# Patient Record
Sex: Female | Born: 1975 | Race: White | Hispanic: No | Marital: Married | Smoking: Former smoker
Health system: Southern US, Community
[De-identification: ages and names within clinical notes are randomized; demographics above are authoritative.]

## PROBLEM LIST (undated history)

## (undated) DIAGNOSIS — R519 Headache, unspecified: Secondary | ICD-10-CM

## (undated) DIAGNOSIS — F32A Depression, unspecified: Secondary | ICD-10-CM

## (undated) DIAGNOSIS — J189 Pneumonia, unspecified organism: Secondary | ICD-10-CM

## (undated) DIAGNOSIS — S065XAA Traumatic subdural hemorrhage with loss of consciousness status unknown, initial encounter: Secondary | ICD-10-CM

## (undated) DIAGNOSIS — K589 Irritable bowel syndrome without diarrhea: Secondary | ICD-10-CM

## (undated) DIAGNOSIS — I10 Essential (primary) hypertension: Secondary | ICD-10-CM

## (undated) DIAGNOSIS — G4730 Sleep apnea, unspecified: Secondary | ICD-10-CM

## (undated) DIAGNOSIS — F419 Anxiety disorder, unspecified: Secondary | ICD-10-CM

## (undated) DIAGNOSIS — K219 Gastro-esophageal reflux disease without esophagitis: Secondary | ICD-10-CM

## (undated) DIAGNOSIS — M1990 Unspecified osteoarthritis, unspecified site: Secondary | ICD-10-CM

## (undated) DIAGNOSIS — Z87442 Personal history of urinary calculi: Secondary | ICD-10-CM

## (undated) DIAGNOSIS — R0600 Dyspnea, unspecified: Secondary | ICD-10-CM

## (undated) DIAGNOSIS — J449 Chronic obstructive pulmonary disease, unspecified: Secondary | ICD-10-CM

## (undated) DIAGNOSIS — M25562 Pain in left knee: Secondary | ICD-10-CM

## (undated) DIAGNOSIS — S069XAA Unspecified intracranial injury with loss of consciousness status unknown, initial encounter: Secondary | ICD-10-CM

## (undated) DIAGNOSIS — R569 Unspecified convulsions: Secondary | ICD-10-CM

## (undated) DIAGNOSIS — F329 Major depressive disorder, single episode, unspecified: Secondary | ICD-10-CM

## (undated) DIAGNOSIS — N179 Acute kidney failure, unspecified: Secondary | ICD-10-CM

## (undated) HISTORY — DX: Sleep apnea, unspecified: G47.30

## (undated) HISTORY — DX: Gastro-esophageal reflux disease without esophagitis: K21.9

## (undated) HISTORY — PX: DILATION AND CURETTAGE OF UTERUS: SHX78

---

## 2007-11-21 ENCOUNTER — Emergency Department (HOSPITAL_COMMUNITY): Admission: EM | Admit: 2007-11-21 | Discharge: 2007-11-22 | Payer: Self-pay | Admitting: Emergency Medicine

## 2008-09-06 ENCOUNTER — Emergency Department (HOSPITAL_COMMUNITY): Admission: EM | Admit: 2008-09-06 | Discharge: 2008-09-06 | Payer: Self-pay | Admitting: Emergency Medicine

## 2008-09-23 ENCOUNTER — Emergency Department (HOSPITAL_COMMUNITY): Admission: EM | Admit: 2008-09-23 | Discharge: 2008-09-23 | Payer: Self-pay | Admitting: Emergency Medicine

## 2008-09-24 ENCOUNTER — Ambulatory Visit: Payer: Self-pay | Admitting: Obstetrics and Gynecology

## 2008-09-24 ENCOUNTER — Inpatient Hospital Stay (HOSPITAL_COMMUNITY): Admission: AD | Admit: 2008-09-24 | Discharge: 2008-09-24 | Payer: Self-pay | Admitting: Obstetrics & Gynecology

## 2008-09-24 ENCOUNTER — Encounter: Payer: Self-pay | Admitting: Obstetrics & Gynecology

## 2008-10-11 ENCOUNTER — Other Ambulatory Visit: Admission: RE | Admit: 2008-10-11 | Discharge: 2008-10-11 | Payer: Self-pay | Admitting: Obstetrics and Gynecology

## 2008-10-19 ENCOUNTER — Emergency Department (HOSPITAL_COMMUNITY): Admission: EM | Admit: 2008-10-19 | Discharge: 2008-10-19 | Payer: Self-pay | Admitting: Emergency Medicine

## 2009-03-04 ENCOUNTER — Emergency Department (HOSPITAL_COMMUNITY): Admission: EM | Admit: 2009-03-04 | Discharge: 2009-03-04 | Payer: Self-pay | Admitting: Emergency Medicine

## 2009-04-07 ENCOUNTER — Emergency Department (HOSPITAL_COMMUNITY): Admission: EM | Admit: 2009-04-07 | Discharge: 2009-04-07 | Payer: Self-pay | Admitting: Emergency Medicine

## 2010-01-17 ENCOUNTER — Ambulatory Visit: Payer: Self-pay | Admitting: Unknown Physician Specialty

## 2010-01-18 ENCOUNTER — Ambulatory Visit: Payer: Self-pay | Admitting: Unknown Physician Specialty

## 2010-01-23 LAB — PATHOLOGY REPORT

## 2010-02-13 ENCOUNTER — Ambulatory Visit: Payer: Self-pay

## 2010-10-24 LAB — URINE MICROSCOPIC-ADD ON

## 2010-10-24 LAB — URINALYSIS, ROUTINE W REFLEX MICROSCOPIC
Leukocytes, UA: NEGATIVE
Protein, ur: NEGATIVE mg/dL
Urobilinogen, UA: 0.2 mg/dL (ref 0.0–1.0)

## 2010-10-25 LAB — CBC
HCT: 37.1 % (ref 36.0–46.0)
Hemoglobin: 12.7 g/dL (ref 12.0–15.0)
Hemoglobin: 12.8 g/dL (ref 12.0–15.0)
MCHC: 34.3 g/dL (ref 30.0–36.0)
MCHC: 34.6 g/dL (ref 30.0–36.0)
MCV: 93.7 fL (ref 78.0–100.0)
MCV: 95.3 fL (ref 78.0–100.0)
RBC: 3.96 MIL/uL (ref 3.87–5.11)
RDW: 15.6 % — ABNORMAL HIGH (ref 11.5–15.5)

## 2010-10-25 LAB — RH IMMUNE GLOBULIN WORKUP (NOT WOMEN'S HOSP)

## 2010-10-25 LAB — DIFFERENTIAL
Basophils Absolute: 0 10*3/uL (ref 0.0–0.1)
Eosinophils Absolute: 0.1 10*3/uL (ref 0.0–0.7)
Lymphs Abs: 1.2 10*3/uL (ref 0.7–4.0)
Neutrophils Relative %: 69 % (ref 43–77)

## 2010-10-25 LAB — COMPREHENSIVE METABOLIC PANEL
ALT: 11 U/L (ref 0–35)
CO2: 24 mEq/L (ref 19–32)
Calcium: 8.9 mg/dL (ref 8.4–10.5)
Creatinine, Ser: 0.36 mg/dL — ABNORMAL LOW (ref 0.4–1.2)
GFR calc non Af Amer: >60 mL/min (ref >60)
Glucose, Bld: 89 mg/dL (ref 70–99)
Sodium: 138 mEq/L (ref 135–145)

## 2010-10-25 LAB — GC/CHLAMYDIA PROBE AMP, GENITAL
Chlamydia, DNA Probe: NEGATIVE
GC Probe Amp, Genital: NEGATIVE

## 2010-10-25 LAB — URINALYSIS, ROUTINE W REFLEX MICROSCOPIC
Bilirubin Urine: NEGATIVE
Ketones, ur: NEGATIVE mg/dL
Protein, ur: NEGATIVE mg/dL
Urobilinogen, UA: 0.2 mg/dL (ref 0.0–1.0)

## 2010-10-25 LAB — RAPID URINE DRUG SCREEN, HOSP PERFORMED
Amphetamines: NOT DETECTED
Barbiturates: NOT DETECTED
Opiates: POSITIVE — AB

## 2010-10-30 LAB — PREGNANCY, URINE: Preg Test, Ur: POSITIVE

## 2011-04-13 ENCOUNTER — Emergency Department (HOSPITAL_COMMUNITY): Payer: Medicaid Other

## 2011-04-13 ENCOUNTER — Encounter: Payer: Self-pay | Admitting: *Deleted

## 2011-04-13 ENCOUNTER — Emergency Department (HOSPITAL_COMMUNITY)
Admission: EM | Admit: 2011-04-13 | Discharge: 2011-04-13 | Disposition: A | Discharge status: Home / Self Care | Payer: Medicaid Other | Attending: Emergency Medicine | Admitting: Emergency Medicine

## 2011-04-13 DIAGNOSIS — F17200 Nicotine dependence, unspecified, uncomplicated: Secondary | ICD-10-CM | POA: Insufficient documentation

## 2011-04-13 DIAGNOSIS — F329 Major depressive disorder, single episode, unspecified: Secondary | ICD-10-CM | POA: Insufficient documentation

## 2011-04-13 DIAGNOSIS — F3289 Other specified depressive episodes: Secondary | ICD-10-CM | POA: Insufficient documentation

## 2011-04-13 DIAGNOSIS — M25579 Pain in unspecified ankle and joints of unspecified foot: Secondary | ICD-10-CM | POA: Insufficient documentation

## 2011-04-13 DIAGNOSIS — W19XXXA Unspecified fall, initial encounter: Secondary | ICD-10-CM | POA: Insufficient documentation

## 2011-04-13 DIAGNOSIS — S93409A Sprain of unspecified ligament of unspecified ankle, initial encounter: Secondary | ICD-10-CM | POA: Insufficient documentation

## 2011-04-13 LAB — CBC
MCH: 30.9 pg (ref 26.0–34.0)
MCHC: 33.2 g/dL (ref 30.0–36.0)
MCV: 93 fL (ref 78.0–100.0)
Platelets: 267 10*3/uL (ref 150–400)
RBC: 4.73 MIL/uL (ref 3.87–5.11)

## 2011-04-13 LAB — COMPREHENSIVE METABOLIC PANEL
AST: 21 U/L (ref 0–37)
CO2: 25 mEq/L (ref 19–32)
Calcium: 9.2 mg/dL (ref 8.4–10.5)
Creatinine, Ser: 0.63 mg/dL (ref 0.50–1.10)
GFR calc Af Amer: >60 mL/min (ref >60)
GFR calc non Af Amer: >60 mL/min (ref >60)
Glucose, Bld: 110 mg/dL — ABNORMAL HIGH (ref 70–99)
Total Protein: 6.8 g/dL (ref 6.0–8.3)

## 2011-04-13 LAB — RAPID URINE DRUG SCREEN, HOSP PERFORMED
Cocaine: NOT DETECTED
Opiates: NOT DETECTED

## 2011-04-13 MED ORDER — CLONAZEPAM 0.5 MG PO TABS: 0.5000 mg | ORAL_TABLET | Freq: Two times a day (BID) | ORAL | Status: DC | PRN

## 2011-04-13 MED ORDER — HYDROCODONE-ACETAMINOPHEN 5-325 MG PO TABS
1.0000 | ORAL_TABLET | Freq: Once | ORAL | Status: DC
  Filled 2011-04-13: qty 1

## 2011-04-13 MED ORDER — IBUPROFEN 800 MG PO TABS
800.0000 mg | ORAL_TABLET | Freq: Once | ORAL | Status: AC
  Administered 2011-04-13: 800 mg via ORAL
  Filled 2011-04-13: qty 1

## 2011-04-13 MED ORDER — ALPRAZOLAM 1 MG PO TABS: 1.0000 mg | ORAL_TABLET | Freq: Two times a day (BID) | ORAL | Status: AC

## 2011-04-13 MED ORDER — POTASSIUM CHLORIDE CRYS ER 20 MEQ PO TBCR
40.0000 meq | EXTENDED_RELEASE_TABLET | Freq: Once | ORAL | Status: AC
  Administered 2011-04-13: 40 meq via ORAL
  Filled 2011-04-13: qty 2

## 2011-04-13 MED ORDER — POTASSIUM CHLORIDE CRYS ER 20 MEQ PO TBCR: EXTENDED_RELEASE_TABLET | ORAL | Status: DC

## 2011-04-13 NOTE — ED Notes (Signed)
Tank top, blue jeans, sunglasses keys, placed in belongings bag and locked in cabinet in EMS storage room.   Jewelry placed in biohazard bag and given to security.

## 2011-04-13 NOTE — ED Notes (Addendum)
Pt c/o right sided ankle pain. Pt states she fell down some steps 2 nights ago and hurt her right ankle. When completing the suicide risk assessment pt was asked about thoughts of harming herself and pt replied yes that she always has thoughts of killing herself. Pt was asked about current thoughts and she again replied, yes she always has thoughts of killing herself. Pt also reported having cut her wrist after punching out a glass window.

## 2011-04-13 NOTE — ED Notes (Signed)
Pt's 35 yo daughter at bedside states that her mom stated to her that "she (mom) would cut her own arm before letting me get hurt."  Sitter at bedside at this time.  Pt wanded by security, placed in paper scrubs.  nad noted.

## 2011-04-13 NOTE — ED Notes (Signed)
Pt given belongings back and security called to bring pt's jewelry.  Pt and pt's daughter currently eating lunch trays.  nad noted.  Sitter d/c.

## 2011-04-13 NOTE — ED Provider Notes (Signed)
History     CSN: [id number] Arrival date & time: 04/13/2011 10:17 AM  Chief Complaint  Patient presents with  . Medical Clearance  . Ankle Pain    (Consider location/radiation/quality/duration/timing/severity/associated sxs/prior treatment) Patient is a 35 y.o. female presenting with ankle pain. The history is provided by the patient.  Ankle Pain  The incident occurred more than 1 week ago. The incident occurred at home. The injury mechanism was a fall. The pain is present in the right ankle. The quality of the pain is described as aching. The pain has been constant since onset. Pertinent negatives include no inability to bear weight, no loss of motion and no loss of sensation. She reports no foreign bodies present. The symptoms are aggravated by bearing weight. She has tried nothing for the symptoms.    History reviewed. No pertinent past medical history.  Past Surgical History  Procedure Date  . Dilation and curettage of uterus     History reviewed. No pertinent family history.  History  Substance Use Topics  . Smoking status: Current Everyday Smoker -- 2.0 packs/day  . Smokeless tobacco: Not on file  . Alcohol Use: Yes     occasionally    OB History    Grav Para Term Preterm Abortions TAB SAB Ect Mult Living                  Review of Systems  Constitutional: Negative for activity change.       All ROS Neg except as noted in HPI  HENT: Negative for nosebleeds and neck pain.   Eyes: Negative for photophobia and discharge.  Respiratory: Negative for cough, shortness of breath and wheezing.   Cardiovascular: Negative for chest pain and palpitations.  Gastrointestinal: Negative for abdominal pain and blood in stool.  Genitourinary: Negative for dysuria, frequency and hematuria.  Musculoskeletal: Negative for back pain and arthralgias.  Skin: Negative.   Neurological: Negative for dizziness, seizures and speech difficulty.  Psychiatric/Behavioral: Positive for  agitation. Negative for hallucinations and confusion.    Allergies  Review of patient's allergies indicates no known allergies.  Home Medications  No current outpatient prescriptions on file.  BP 141/97  Pulse 108  Temp(Src) 98.8 F (37.1 C) (Oral)  Resp 18  SpO2 99%  LMP 03/16/2011  Physical Exam  Nursing note and vitals reviewed. Constitutional: She is oriented to person, place, and time. She appears well-developed and well-nourished.  Non-toxic appearance.  HENT:  Head: Normocephalic.  Right Ear: Tympanic membrane and external ear normal.  Left Ear: Tympanic membrane and external ear normal.  Eyes: EOM and lids are normal. Pupils are equal, round, and reactive to light.  Neck: Normal range of motion. Neck supple. Carotid bruit is not present.  Cardiovascular: Normal rate, regular rhythm, normal heart sounds, intact distal pulses and normal pulses.   Pulmonary/Chest: Breath sounds normal. No respiratory distress.  Abdominal: Soft. Bowel sounds are normal. There is no tenderness. There is no guarding.  Musculoskeletal: Normal range of motion.       Swelling and pain of the lateral malleolus. FROM of the toes. Achilles intact. Distal pulses and sensory wnl.  Lymphadenopathy:       Head (right side): No submandibular adenopathy present.       Head (left side): No submandibular adenopathy present.    She has no cervical adenopathy.  Neurological: She is alert and oriented to person, place, and time. She has normal strength. No cranial nerve deficit or sensory deficit.  Skin: Skin  is warm and dry.  Psychiatric: She has a normal mood and affect. Her speech is normal.    ED Course:1138 spoke with Telepsych MD. He will conduct interview and call back with recommendations. 11:52 spoke with Ms McFarling with CPS - She will make a referal to [place] CPS-DSS.  Procedures (including critical care time)   Labs Reviewed  CBC  PREGNANCY, URINE  COMPREHENSIVE METABOLIC PANEL    ETHANOL  URINE RAPID DRUG SCREEN (HOSP PERFORMED)   No results found.   Dx: 1. Rt ankle sprain   2. Depression   3. Medication noncompliance  4. Hypokalemia   MDM  I have reviewed nursing notes, vital signs, and all appropriate lab and imaging results for this patient. Pt interviewed by Tele-Psych - Dr Gary Fleet. It is safe for pt to go home. Will need [facility] evaluation and treatment or return to her MD at [facility].   Results for orders placed during the hospital encounter of 04/13/11  CBC      Component Value Range   WBC 9.2  4.0 - 10.5 (K/uL)   RBC 4.73  3.87 - 5.11 (MIL/uL)   Hemoglobin 14.6  12.0 - 15.0 (g/dL)   HCT 16.1  09.6 - 04.5 (%)   MCV 93.0  78.0 - 100.0 (fL)   MCH 30.9  26.0 - 34.0 (pg)   MCHC 33.2  30.0 - 36.0 (g/dL)   RDW 40.9  81.1 - 91.4 (%)   Platelets 267  150 - 400 (K/uL)  COMPREHENSIVE METABOLIC PANEL      Component Value Range   Sodium 138  135 - 145 (mEq/L)   Potassium 2.8 (*) 3.5 - 5.1 (mEq/L)   Chloride 101  96 - 112 (mEq/L)   CO2 25  19 - 32 (mEq/L)   Glucose, Bld 110 (*) 70 - 99 (mg/dL)   BUN 13  6 - 23 (mg/dL)   Creatinine, Ser 7.82  0.50 - 1.10 (mg/dL)   Calcium 9.2  8.4 - 95.6 (mg/dL)   Total Protein 6.8  6.0 - 8.3 (g/dL)   Albumin 3.8  3.5 - 5.2 (g/dL)   AST 21  0 - 37 (U/L)   ALT 25  0 - 35 (U/L)   Alkaline Phosphatase 122 (*) 39 - 117 (U/L)   Total Bilirubin 0.9  0.3 - 1.2 (mg/dL)   GFR calc non Af Amer >60  >60 (mL/min)   GFR calc Af Amer >60  >60 (mL/min)  ETHANOL      Component Value Range   Alcohol, Ethyl (B) <11  0 - 11 (mg/dL)  URINE RAPID DRUG SCREEN (HOSP PERFORMED)      Component Value Range   Opiates NONE DETECTED  NONE DETECTED    Cocaine NONE DETECTED  NONE DETECTED    Benzodiazepines POSITIVE (*) NONE DETECTED    Amphetamines NONE DETECTED  NONE DETECTED    Tetrahydrocannabinol NONE DETECTED  NONE DETECTED    Barbiturates NONE DETECTED  NONE DETECTED   PREGNANCY, URINE      Component Value Range    Preg Test, Ur NEGATIVE     Dg Ankle Complete Right  04/13/2011  *RADIOLOGY REPORT*  Clinical Data: Fall, right ankle pain  RIGHT ANKLE - COMPLETE 3+ VIEW  Comparison: None.  Findings: Three views of the right ankle submitted.  No acute fracture or subluxation.  Ankle mortise preserved.  Soft tissue swelling noted adjacent to lateral malleolus.  IMPRESSION: No acute fracture or subluxation.  Soft tissue  swelling adjacent to lateral malleolus.  Original Report Authenticated By: Natasha Mead, M.D.        Kathie Dike, [place] 04/13/11 1251

## 2011-04-13 NOTE — ED Provider Notes (Signed)
Medical screening examination/treatment/procedure(s) were conducted as a shared visit with non-physician practitioner(s) and myself.  I personally evaluated the patient during the encounter   Patient now denies suicidal ideation She reports she would never harm her daughter or herself Psych consult Child protective services consult   Joya Gaskins, MD 04/13/11 1214

## 2011-04-13 NOTE — ED Notes (Signed)
Child Protective Services called and Melissa returned the call to [employer].

## 2011-04-13 NOTE — ED Notes (Signed)
Telepsych was call and information faxed to [phone].

## 2011-04-14 NOTE — ED Provider Notes (Signed)
Medical screening examination/treatment/procedure(s) were conducted as a shared visit with non-physician practitioner(s) and myself.  I personally evaluated the patient during the encounter   Joya Gaskins, MD 04/14/11 [phone]

## 2012-12-27 ENCOUNTER — Telehealth (HOSPITAL_COMMUNITY): Payer: Self-pay | Admitting: Emergency Medicine

## 2012-12-27 ENCOUNTER — Encounter (HOSPITAL_COMMUNITY): Payer: Self-pay | Admitting: Emergency Medicine

## 2012-12-27 ENCOUNTER — Emergency Department (HOSPITAL_COMMUNITY)
Admission: EM | Admit: 2012-12-27 | Discharge: 2012-12-27 | Disposition: A | Discharge status: Home / Self Care | Payer: Medicaid Other | Attending: Emergency Medicine | Admitting: Emergency Medicine

## 2012-12-27 DIAGNOSIS — Z87442 Personal history of urinary calculi: Secondary | ICD-10-CM | POA: Insufficient documentation

## 2012-12-27 DIAGNOSIS — Z79899 Other long term (current) drug therapy: Secondary | ICD-10-CM | POA: Insufficient documentation

## 2012-12-27 DIAGNOSIS — J441 Chronic obstructive pulmonary disease with (acute) exacerbation: Secondary | ICD-10-CM | POA: Insufficient documentation

## 2012-12-27 DIAGNOSIS — F17200 Nicotine dependence, unspecified, uncomplicated: Secondary | ICD-10-CM | POA: Insufficient documentation

## 2012-12-27 DIAGNOSIS — L0100 Impetigo, unspecified: Secondary | ICD-10-CM | POA: Insufficient documentation

## 2012-12-27 HISTORY — DX: Chronic obstructive pulmonary disease, unspecified: J44.9

## 2012-12-27 MED ORDER — MUPIROCIN CALCIUM 2 % EX CREA: TOPICAL_CREAM | Freq: Two times a day (BID) | CUTANEOUS | Status: DC

## 2012-12-27 MED ORDER — HYDROXYZINE HCL 25 MG PO TABS: ORAL_TABLET | ORAL | Status: DC

## 2012-12-27 MED ORDER — DOXYCYCLINE HYCLATE 100 MG PO CAPS: 100.0000 mg | ORAL_CAPSULE | Freq: Two times a day (BID) | ORAL | Status: AC

## 2012-12-27 MED ORDER — DEXAMETHASONE 6 MG PO TABS: ORAL_TABLET | ORAL | Status: DC

## 2012-12-27 NOTE — ED Notes (Signed)
States that she started having open sores on her breasts and left arm about 2 weeks ago, states that she visited her PCP and was placed on antibiotic cream and oral antibiotics. States that she has not had any improvement and the areas have worsened.

## 2012-12-27 NOTE — ED Provider Notes (Signed)
Medical screening examination/treatment/procedure(s) were performed by non-physician practitioner and as supervising physician I was immediately available for consultation/collaboration.   Joya Gaskins, MD 12/27/12 1444

## 2012-12-27 NOTE — ED Notes (Signed)
Pt with multiple open sores to breasts and to left forearm, states that she had Nair splash on her in the same spots

## 2012-12-27 NOTE — ED Provider Notes (Signed)
History     CSN: [id number]  Arrival date & time 12/27/12  1130   First MD Initiated Contact with Patient 12/27/12 1156      Chief Complaint  Patient presents with  . Rash    (Consider location/radiation/quality/duration/timing/severity/associated sxs/prior treatment) Patient is a 37 y.o. female presenting with rash. The history is provided by the patient.  Rash Pain location: chest, breast, right and left arms. Pain quality: burning   Pain radiates to:  Does not radiate Onset quality:  Gradual Duration:  2 weeks Timing:  Intermittent Progression:  Worsening Context: not diet changes, not medication withdrawal, not recent illness, not recent travel and not suspicious food intake   Relieved by:  Nothing Worsened by:  Nothing tried Ineffective treatments:  OTC medications (bactroban.) Associated symptoms: shortness of breath   Associated symptoms: no chest pain, no chills, no cough, no dysuria, no fever, no hematuria, no nausea and no vomiting   Associated symptoms comment:  Itching Risk factors: no recent hospitalization     Past Medical History  Diagnosis Date  . COPD (chronic obstructive pulmonary disease)   . Renal disorder     Kidney stones    Past Surgical History  Procedure Laterality Date  . Dilation and curettage of uterus      History reviewed. No pertinent family history.  History  Substance Use Topics  . Smoking status: Current Every Day Smoker -- 2.00 packs/day  . Smokeless tobacco: Not on file  . Alcohol Use: Yes     Comment: occasionally    OB History   Grav Para Term Preterm Abortions TAB SAB Ect Mult Living                  Review of Systems  Constitutional: Negative for fever, chills and activity change.       All ROS Neg except as noted in HPI  HENT: Negative for nosebleeds and neck pain.   Eyes: Negative for photophobia and discharge.  Respiratory: Positive for shortness of breath. Negative for cough and wheezing.   Cardiovascular:  Negative for chest pain and palpitations.  Gastrointestinal: Negative for nausea, vomiting, abdominal pain and blood in stool.  Genitourinary: Negative for dysuria, frequency and hematuria.  Musculoskeletal: Negative for back pain and arthralgias.  Skin: Positive for rash.  Neurological: Negative for dizziness, seizures and speech difficulty.  Psychiatric/Behavioral: Negative for hallucinations and confusion.    Allergies  Review of patient's allergies indicates no known allergies.  Home Medications   Current Outpatient Rx  Name  Route  Sig  Dispense  Refill  . albuterol (PROVENTIL HFA;VENTOLIN HFA) 108 (90 BASE) MCG/ACT inhaler   Inhalation   Inhale 2 puffs into the lungs every 6 (six) hours as needed for wheezing.         Marland Kitchen albuterol (PROVENTIL) (2.5 MG/3ML) 0.083% nebulizer solution   Nebulization   Take 2.5 mg by nebulization every 6 (six) hours as needed for wheezing.         Marland Kitchen ALPRAZolam (XANAX) 1 MG tablet   Oral   Take 1 mg by mouth at bedtime as needed for sleep.         . beclomethasone (QVAR) 80 MCG/ACT inhaler   Inhalation   Inhale 1 puff into the lungs as needed.         . cyclobenzaprine (FLEXERIL) 10 MG tablet   Oral   Take 10 mg by mouth 3 (three) times daily as needed for muscle spasms.         Marland Kitchen  etodolac (LODINE) 400 MG tablet   Oral   Take 400 mg by mouth 2 (two) times daily.         . phentermine 37.5 MG capsule   Oral   Take 37.5 mg by mouth every morning.         . potassium chloride SA (K-DUR,KLOR-CON) 20 MEQ tablet      1 po daily with food   15 tablet   0   . tiotropium (SPIRIVA) 18 MCG inhalation capsule   Inhalation   Place 18 mcg into inhaler and inhale daily.         Marland Kitchen dexamethasone (DECADRON) 6 MG tablet      1 po bid with food   10 tablet   0   . doxycycline (VIBRAMYCIN) 100 MG capsule   Oral   Take 1 capsule (100 mg total) by mouth 2 (two) times daily.   14 capsule   0   . hydrOXYzine (ATARAX/VISTARIL)  25 MG tablet      1 at hs for itching   12 tablet   0   . mupirocin cream (BACTROBAN) 2 %   Topical   Apply topically 2 (two) times daily.   15 g   0     BP 127/71  Pulse 102  Temp(Src) 98 F (36.7 C) (Oral)  Resp 20  Ht 5\' 1"  (1.549 m)  Wt 216 lb (97.977 kg)  BMI 40.83 kg/m2  SpO2 99%  LMP 12/06/2012  Physical Exam  Nursing note and vitals reviewed. Constitutional: She is oriented to person, place, and time. She appears well-developed and well-nourished.  Non-toxic appearance.  HENT:  Head: Normocephalic.  Right Ear: Tympanic membrane and external ear normal.  Left Ear: Tympanic membrane and external ear normal.  Eyes: EOM and lids are normal. Pupils are equal, round, and reactive to light.  Neck: Normal range of motion. Neck supple. Carotid bruit is not present.  Cardiovascular: Normal rate, regular rhythm, normal heart sounds, intact distal pulses and normal pulses.   Pulmonary/Chest: Breath sounds normal. No respiratory distress.  Abdominal: Soft. Bowel sounds are normal. There is no tenderness. There is no guarding.  Musculoskeletal: Normal range of motion.  Lymphadenopathy:       Head (right side): No submandibular adenopathy present.       Head (left side): No submandibular adenopathy present.    She has no cervical adenopathy.  Neurological: She is alert and oriented to person, place, and time. She has normal strength. No cranial nerve deficit or sensory deficit.  Skin: Skin is warm and dry.  There is a 2 cm area of the left forearm with increased redness, partial scabbing, no significant drainage appreciated.  There is a red papular area with scratches on the left forearm and the right forearm.  There are multiple round lesions of the right and left breast area some denuded and some with scabs. There is no red streaking of any of these lesions. There is no current drainage noted. The areas are not hot to touch.   Psychiatric: She has a normal mood and affect.  Her speech is normal.    ED Course  Procedures (including critical care time)  Labs Reviewed - No data to display No results found.   1. Impetigo       MDM  I have reviewed nursing notes, vital signs, and all appropriate lab and imaging results for this patient., This rash has a confusing pattern, but I suspect that the patient  has a form of impetigo present. Will treat the patient with Bactroban, Vistaril at bedtime for itching, and doxycycline. Patient is given the name of dermatology for evaluation if not improving.       Kathie Dike, PA-C 12/27/12 1249

## 2012-12-27 NOTE — ED Notes (Signed)
The patient now states that she did splash a hair removal product onto the same areas and did not rinse it off Darene Lamer).  States that she did not advise her PCP about the incident.

## 2012-12-27 NOTE — ED Notes (Signed)
Pt put in paper scrubs and wanded by security. Belongings locked in cabinet.

## 2013-10-27 ENCOUNTER — Emergency Department (HOSPITAL_COMMUNITY): Payer: Medicaid Other

## 2013-10-27 ENCOUNTER — Encounter (HOSPITAL_COMMUNITY): Payer: Self-pay | Admitting: Emergency Medicine

## 2013-10-27 ENCOUNTER — Emergency Department (HOSPITAL_COMMUNITY)
Admission: EM | Admit: 2013-10-27 | Discharge: 2013-10-27 | Disposition: A | Discharge status: Home / Self Care | Payer: Medicaid Other | Attending: Emergency Medicine | Admitting: Emergency Medicine

## 2013-10-27 DIAGNOSIS — Z792 Long term (current) use of antibiotics: Secondary | ICD-10-CM | POA: Insufficient documentation

## 2013-10-27 DIAGNOSIS — Z79899 Other long term (current) drug therapy: Secondary | ICD-10-CM | POA: Insufficient documentation

## 2013-10-27 DIAGNOSIS — S92309A Fracture of unspecified metatarsal bone(s), unspecified foot, initial encounter for closed fracture: Secondary | ICD-10-CM | POA: Insufficient documentation

## 2013-10-27 DIAGNOSIS — Z87442 Personal history of urinary calculi: Secondary | ICD-10-CM | POA: Insufficient documentation

## 2013-10-27 DIAGNOSIS — Y929 Unspecified place or not applicable: Secondary | ICD-10-CM | POA: Insufficient documentation

## 2013-10-27 DIAGNOSIS — F17200 Nicotine dependence, unspecified, uncomplicated: Secondary | ICD-10-CM | POA: Insufficient documentation

## 2013-10-27 DIAGNOSIS — W010XXA Fall on same level from slipping, tripping and stumbling without subsequent striking against object, initial encounter: Secondary | ICD-10-CM | POA: Insufficient documentation

## 2013-10-27 DIAGNOSIS — S92902A Unspecified fracture of left foot, initial encounter for closed fracture: Secondary | ICD-10-CM

## 2013-10-27 DIAGNOSIS — X500XXA Overexertion from strenuous movement or load, initial encounter: Secondary | ICD-10-CM | POA: Insufficient documentation

## 2013-10-27 DIAGNOSIS — I10 Essential (primary) hypertension: Secondary | ICD-10-CM | POA: Insufficient documentation

## 2013-10-27 DIAGNOSIS — Y9301 Activity, walking, marching and hiking: Secondary | ICD-10-CM | POA: Insufficient documentation

## 2013-10-27 DIAGNOSIS — J441 Chronic obstructive pulmonary disease with (acute) exacerbation: Secondary | ICD-10-CM | POA: Insufficient documentation

## 2013-10-27 HISTORY — DX: Irritable bowel syndrome, unspecified: K58.9

## 2013-10-27 HISTORY — DX: Essential (primary) hypertension: I10

## 2013-10-27 MED ORDER — KETOROLAC TROMETHAMINE 10 MG PO TABS
10.0000 mg | ORAL_TABLET | Freq: Once | ORAL | Status: AC
  Administered 2013-10-27: 10 mg via ORAL
  Filled 2013-10-27: qty 1

## 2013-10-27 MED ORDER — HYDROCODONE-ACETAMINOPHEN 7.5-325 MG PO TABS: 1.0000 | ORAL_TABLET | ORAL | Status: DC | PRN

## 2013-10-27 MED ORDER — HYDROCODONE-ACETAMINOPHEN 5-325 MG PO TABS
2.0000 | ORAL_TABLET | Freq: Once | ORAL | Status: AC
  Administered 2013-10-27: 2 via ORAL
  Filled 2013-10-27: qty 2

## 2013-10-27 MED ORDER — MELOXICAM 7.5 MG PO TABS: ORAL_TABLET | ORAL | Status: DC

## 2013-10-27 NOTE — ED Provider Notes (Signed)
CSN: [id number]     Arrival date & time 10/27/13  0730 History   First MD Initiated Contact with Patient 10/27/13 0805     Chief Complaint  Patient presents with  . Foot Injury     (Consider location/radiation/quality/duration/timing/severity/associated sxs/prior Treatment) HPI Comments: Patient is a 38 year old female who presents to the emergency department with complaint of pain to the left foot. The patient states that on yesterday while walking she tripped on her pants leg, fell and hyperextended the toes of the left foot. She felt severe pain at that time, came home and tried to apply ice. She states that during the night she had continued pain, and this morning she had even more pain and some swelling. She noticed a mild bruising present and came to the emergency department for  Evaluation. Patient denies any previous operations or procedures involving the left foot. She denies being on any blood thinning type medications, or having any bleeding disorders.  Patient is a 38 y.o. female presenting with foot injury.  Foot Injury Location:  Foot Injury: yes   Foot location:  L foot Associated symptoms: no back pain and no neck pain     Past Medical History  Diagnosis Date  . COPD (chronic obstructive pulmonary disease)   . Renal disorder     Kidney stones  . Hypertension   . IBS (irritable bowel syndrome)    Past Surgical History  Procedure Laterality Date  . Dilation and curettage of uterus     No family history on file. History  Substance Use Topics  . Smoking status: Current Every Day Smoker -- 2.00 packs/day  . Smokeless tobacco: Not on file  . Alcohol Use: Yes     Comment: occasionally   OB History   Grav Para Term Preterm Abortions TAB SAB Ect Mult Living                 Review of Systems  Constitutional: Negative for activity change.       All ROS Neg except as noted in HPI  HENT: Negative for nosebleeds.   Eyes: Negative for photophobia and discharge.   Respiratory: Positive for cough and shortness of breath. Negative for wheezing.   Cardiovascular: Negative for chest pain and palpitations.  Gastrointestinal: Negative for abdominal pain and blood in stool.  Genitourinary: Negative for dysuria, frequency and hematuria.  Musculoskeletal: Negative for arthralgias, back pain and neck pain.  Skin: Negative.   Neurological: Negative for dizziness, seizures and speech difficulty.  Psychiatric/Behavioral: Negative for hallucinations and confusion.      Allergies  Review of patient's allergies indicates no known allergies.  Home Medications   Prior to Admission medications   Medication Sig Start Date End Date Taking? Authorizing Provider  albuterol (PROVENTIL HFA;VENTOLIN HFA) 108 (90 BASE) MCG/ACT inhaler Inhale 2 puffs into the lungs every 6 (six) hours as needed for wheezing.    Historical Provider, MD  albuterol (PROVENTIL) (2.5 MG/3ML) 0.083% nebulizer solution Take 2.5 mg by nebulization every 6 (six) hours as needed for wheezing.    Historical Provider, MD  ALPRAZolam Prudy Feeler) 1 MG tablet Take 1 mg by mouth at bedtime as needed for sleep.    Historical Provider, MD  beclomethasone (QVAR) 80 MCG/ACT inhaler Inhale 1 puff into the lungs as needed.    Historical Provider, MD  cyclobenzaprine (FLEXERIL) 10 MG tablet Take 10 mg by mouth 3 (three) times daily as needed for muscle spasms.    Historical Provider, MD  dexamethasone (DECADRON)  6 MG tablet 1 po bid with food 12/27/12   Kathie DikeHobson M Merranda Bolls, PA-C  etodolac (LODINE) 400 MG tablet Take 400 mg by mouth 2 (two) times daily.    Historical Provider, MD  hydrOXYzine (ATARAX/VISTARIL) 25 MG tablet 1 at hs for itching 12/27/12   Kathie DikeHobson M Orlin Kann, PA-C  mupirocin cream (BACTROBAN) 2 % Apply topically 2 (two) times daily. 12/27/12   Kathie DikeHobson M Atiyana Welte, PA-C  phentermine 37.5 MG capsule Take 37.5 mg by mouth every morning.    Historical Provider, MD  potassium chloride SA (K-DUR,KLOR-CON) 20 MEQ tablet 1  po daily with food 04/13/11   Kathie DikeHobson M Brand Siever, PA-C  tiotropium (SPIRIVA) 18 MCG inhalation capsule Place 18 mcg into inhaler and inhale daily.    Historical Provider, MD   BP 145/84  Pulse 104  Temp(Src) 98.6 F (37 C) (Oral)  Resp 16  Ht 5\' 1"  (1.549 m)  Wt 209 lb (94.802 kg)  BMI 39.51 kg/m2  SpO2 99% Physical Exam  Nursing note and vitals reviewed. Constitutional: She is oriented to person, place, and time. She appears well-developed and well-nourished.  Non-toxic appearance.  HENT:  Head: Normocephalic.  Right Ear: Tympanic membrane and external ear normal.  Left Ear: Tympanic membrane and external ear normal.  Eyes: EOM and lids are normal. Pupils are equal, round, and reactive to light.  Neck: Normal range of motion. Neck supple. Carotid bruit is not present.  Cardiovascular: Normal rate, regular rhythm, normal heart sounds, intact distal pulses and normal pulses.   Pulmonary/Chest: Breath sounds normal. No respiratory distress.  Abdominal: Soft. Bowel sounds are normal. There is no tenderness. There is no guarding.  Musculoskeletal: Normal range of motion.  There is full range of motion of the left hip knee and ankle. The Achilles tendon is intact on the left. There is swelling and bruising of the dorsum of the left foot. There is pain to movement of the toes of the left foot. There is pain to palpation of the metatarsals, third, fourth, and fifth more than the other toes.  Lymphadenopathy:       Head (right side): No submandibular adenopathy present.       Head (left side): No submandibular adenopathy present.    She has no cervical adenopathy.  Neurological: She is alert and oriented to person, place, and time. She has normal strength. No cranial nerve deficit or sensory deficit.  Skin: Skin is warm and dry.  Psychiatric: She has a normal mood and affect. Her speech is normal.    ED Course FRACTURE CARE, LEFT FOOT. Patient identified by arm band. Procedure discussed with  the patient in terms which he understood. Fracture explained and discussed with the patient. The third and fourth toes of the left foot were buddy taped, then a Watson-Jones dressing applied, postoperative shoe and crutches supplied. Pain dressed with Toradol and Norco. After the patient was buddy taped, the patient was noted to be neurovascularly intact. The patient tolerated the procedure without problem.   SPLINT APPLICATION Date/Time: 10/27/2013 10:30 AM Performed by: Kathie DikeBRYANT, Brendan Gadson M Authorized by: Kathie DikeBRYANT, Saabir Blyth M Consent: Verbal consent obtained. Risks and benefits: risks, benefits and alternatives were discussed Consent given by: patient Patient understanding: patient states understanding of the procedure being performed Patient identity confirmed: arm band Time out: Immediately prior to procedure a "time out" was called to verify the correct patient, procedure, equipment, support staff and site/side marked as required. Location: Left foot. Splint type: Watson-Jones. Supplies used: cotton padding and elastic  bandage Post-procedure: The splinted body part was neurovascularly unchanged following the procedure. Patient tolerance: Patient tolerated the procedure well with no immediate complications.   (including critical care time) Labs Review Labs Reviewed - No data to display  Imaging Review No results found.   EKG Interpretation None      MDM X-ray of the left foot reveals acute mildly angulated fractures of the heads of the third and fourth metatarsals. No other fracture appreciated.  Patient fitted with a Watson-Jones splint and postoperative shoe and crutches.  Patient to followup with Dr. Hilda LiasKeeling for orthopedic evaluation. Prescription for Mobic, and Norco given to the patient.    Final diagnoses:  None    *I have reviewed nursing notes, vital signs, and all appropriate lab and imaging results for this patient.Kathie Dike**    Taeshaun Rames M Annick Dimaio, PA-C 10/27/13 1034

## 2013-10-27 NOTE — Discharge Instructions (Signed)
You have a broken bone behind the third and fourth toe of the left foot. Please apply ice, please keep your foot elevated above your waist is much as possible. When up and about, make sure you use your postoperative shoe, and your crutches. Please keep the splint dry. Please see the orthopedic surgeon listed above, or the orthopedic surgeon of your choice as sone as possible for followup. Use Mobic 2 times daily with food. May use Norco for pain if needed. This medication may cause drowsiness, please use with caution.

## 2013-10-27 NOTE — ED Notes (Signed)
Left foot pain after falling yesterday. Bruising to the top of the foot.

## 2013-10-27 NOTE — ED Provider Notes (Signed)
Medical screening examination/treatment/procedure(s) were conducted as a shared visit with non-physician practitioner(s) and myself.  I personally evaluated the patient during the encounter.   EKG Interpretation None     X-ray of left foot reveals fractures of the third and fourth metatarsal. Pain medication, immobilization, referral to orthopedics  Donnetta HutchingBrian Edwin Cherian, MD 10/27/13 1513

## 2014-01-11 ENCOUNTER — Emergency Department (HOSPITAL_COMMUNITY): Payer: Medicaid Other

## 2014-01-11 ENCOUNTER — Emergency Department (HOSPITAL_COMMUNITY)
Admission: EM | Admit: 2014-01-11 | Discharge: 2014-01-11 | Disposition: A | Discharge status: Home / Self Care | Payer: Medicaid Other | Attending: Emergency Medicine | Admitting: Emergency Medicine

## 2014-01-11 ENCOUNTER — Encounter (HOSPITAL_COMMUNITY): Payer: Self-pay | Admitting: Emergency Medicine

## 2014-01-11 ENCOUNTER — Other Ambulatory Visit (HOSPITAL_COMMUNITY): Payer: Medicaid Other

## 2014-01-11 DIAGNOSIS — R112 Nausea with vomiting, unspecified: Secondary | ICD-10-CM | POA: Insufficient documentation

## 2014-01-11 DIAGNOSIS — J4489 Other specified chronic obstructive pulmonary disease: Secondary | ICD-10-CM | POA: Insufficient documentation

## 2014-01-11 DIAGNOSIS — R1900 Intra-abdominal and pelvic swelling, mass and lump, unspecified site: Secondary | ICD-10-CM | POA: Insufficient documentation

## 2014-01-11 DIAGNOSIS — J449 Chronic obstructive pulmonary disease, unspecified: Secondary | ICD-10-CM | POA: Insufficient documentation

## 2014-01-11 DIAGNOSIS — Z8719 Personal history of other diseases of the digestive system: Secondary | ICD-10-CM | POA: Insufficient documentation

## 2014-01-11 DIAGNOSIS — F17200 Nicotine dependence, unspecified, uncomplicated: Secondary | ICD-10-CM | POA: Insufficient documentation

## 2014-01-11 DIAGNOSIS — Z87442 Personal history of urinary calculi: Secondary | ICD-10-CM | POA: Insufficient documentation

## 2014-01-11 DIAGNOSIS — Z79899 Other long term (current) drug therapy: Secondary | ICD-10-CM | POA: Insufficient documentation

## 2014-01-11 DIAGNOSIS — Z87891 Personal history of nicotine dependence: Secondary | ICD-10-CM | POA: Insufficient documentation

## 2014-01-11 LAB — CBC WITH DIFFERENTIAL/PLATELET
Basophils Absolute: 0 10*3/uL (ref 0.0–0.1)
Basophils Relative: 0 % (ref 0–1)
EOS ABS: 0.1 10*3/uL (ref 0.0–0.7)
Eosinophils Relative: 1 % (ref 0–5)
HCT: 40.5 % (ref 36.0–46.0)
Hemoglobin: 13.5 g/dL (ref 12.0–15.0)
LYMPHS ABS: 1.2 10*3/uL (ref 0.7–4.0)
Lymphocytes Relative: 11 % — ABNORMAL LOW (ref 12–46)
MCH: 30.3 pg (ref 26.0–34.0)
MCHC: 33.3 g/dL (ref 30.0–36.0)
MCV: 91 fL (ref 78.0–100.0)
MONOS PCT: 4 % (ref 3–12)
Monocytes Absolute: 0.4 10*3/uL (ref 0.1–1.0)
NEUTROS ABS: 8.8 10*3/uL — AB (ref 1.7–7.7)
NEUTROS PCT: 84 % — AB (ref 43–77)
Platelets: 294 10*3/uL (ref 150–400)
RBC: 4.45 MIL/uL (ref 3.87–5.11)
RDW: 14.3 % (ref 11.5–15.5)
WBC: 10.4 10*3/uL (ref 4.0–10.5)

## 2014-01-11 LAB — WET PREP, GENITAL
CLUE CELLS WET PREP: NONE SEEN
Trich, Wet Prep: NONE SEEN
WBC WET PREP: NONE SEEN
YEAST WET PREP: NONE SEEN

## 2014-01-11 LAB — BASIC METABOLIC PANEL
BUN: 13 mg/dL (ref 6–23)
CO2: 27 mEq/L (ref 19–32)
Calcium: 9.1 mg/dL (ref 8.4–10.5)
Chloride: 99 mEq/L (ref 96–112)
Creatinine, Ser: 0.74 mg/dL (ref 0.50–1.10)
GFR calc Af Amer: >90 mL/min (ref >90)
Glucose, Bld: 96 mg/dL (ref 70–99)
POTASSIUM: 4.1 meq/L (ref 3.7–5.3)
SODIUM: 139 meq/L (ref 137–147)

## 2014-01-11 LAB — URINALYSIS, ROUTINE W REFLEX MICROSCOPIC
Bilirubin Urine: NEGATIVE
GLUCOSE, UA: NEGATIVE mg/dL
KETONES UR: NEGATIVE mg/dL
Leukocytes, UA: NEGATIVE
Nitrite: NEGATIVE
Protein, ur: NEGATIVE mg/dL
Specific Gravity, Urine: 1.015 (ref 1.005–1.030)
Urobilinogen, UA: 1 mg/dL (ref 0.0–1.0)
pH: 6.5 (ref 5.0–8.0)

## 2014-01-11 LAB — PREGNANCY, URINE: PREG TEST UR: NEGATIVE

## 2014-01-11 LAB — URINE MICROSCOPIC-ADD ON

## 2014-01-11 MED ORDER — STERILE WATER FOR INJECTION IJ SOLN
INTRAMUSCULAR | Status: AC
  Filled 2014-01-11: qty 10

## 2014-01-11 MED ORDER — CEFTRIAXONE SODIUM 250 MG IJ SOLR
250.0000 mg | Freq: Once | INTRAMUSCULAR | Status: DC
  Filled 2014-01-11: qty 250

## 2014-01-11 MED ORDER — ONDANSETRON HCL 4 MG/2ML IJ SOLN
4.0000 mg | Freq: Once | INTRAMUSCULAR | Status: AC
  Administered 2014-01-11: 4 mg via INTRAVENOUS
  Filled 2014-01-11: qty 2

## 2014-01-11 MED ORDER — MORPHINE SULFATE 4 MG/ML IJ SOLN
4.0000 mg | Freq: Once | INTRAMUSCULAR | Status: AC
  Administered 2014-01-11: 4 mg via INTRAVENOUS
  Filled 2014-01-11: qty 1

## 2014-01-11 MED ORDER — HYDROMORPHONE HCL PF 1 MG/ML IJ SOLN
1.0000 mg | Freq: Once | INTRAMUSCULAR | Status: AC
  Administered 2014-01-11: 1 mg via INTRAVENOUS
  Filled 2014-01-11: qty 1

## 2014-01-11 MED ORDER — MEGESTROL ACETATE 40 MG PO TABS: 40.0000 mg | ORAL_TABLET | Freq: Every day | ORAL | Status: DC

## 2014-01-11 MED ORDER — IOHEXOL 300 MG/ML  SOLN
100.0000 mL | Freq: Once | INTRAMUSCULAR | Status: AC | PRN
  Administered 2014-01-11: 100 mL via INTRAVENOUS

## 2014-01-11 MED ORDER — OXYCODONE-ACETAMINOPHEN 5-325 MG PO TABS: 1.0000 | ORAL_TABLET | ORAL | Status: DC | PRN

## 2014-01-11 NOTE — Discharge Instructions (Signed)
Pelvic Mass °A "mass" is a lump that either your caregiver found during an examination or you found before seeing your caregiver. The "pelvis" is the lower portion of the trunk in between the hip bones. There are many possible reasons why a lump has appeared. Testing will help determine the cause and the steps to a solution. °CAUSES  °Before complete testing is done, it may be difficult or impossible for your caregiver to know if the lump is truly in one of the pelvic organs (such as the uterus or ovaries) or is coming from one of many organs that are near the pelvis. Problems in the colon or kidney can also lead to a lump that might seem to be in the pelvis. °If testing shows that the mass is in the pelvis, there are still many possible causes: °· Tumors and cancers. These problems are relatively common and are the greatest source of worry for patients. Cancerous lumps in the pelvis may be due to cancers that started in the uterus or ovaries or due to cancers that started in other areas and then spread to the pelvis. Many cancers are very treatable when found early. °· Non-cancerous tumors and masses. There are a large number of common and uncommon non-cancerous problems that can lead to a mass in the pelvis. Two very common ones are fibroids of the uterus and ovarian cysts. Before testing and/or surgery, it may be impossible to tell the difference between these problems and a cancer. °· Infection. Certain types of infections can produce a mass in the pelvis. The infection might be caused by bacteria. If there is an infection treatment might include antibiotics. Masses from infection can also be caused by certain viruses, and in rare cases, by fungi or parasites. If infection is the cause, your caregiver will be able to determine the type of germ responsible for the mass by doing appropriate testing. °· Inflammatory bowel disease. These are diseases thought to be caused by a defect in the immune system of the  intestine. There are two inflammatory bowel diseases: Ulcerative Colitis and Crohn's Disease. They are lifelong problems with symptoms that can come and go. Sometimes, patients with these diseases will develop a mass in the lower part of the colon that can make it seem as though there is a mass in the pelvis. °· Past Surgery. If there has been pelvic surgery in the past, and there is a lot of scarring that forms during the process of healing, this can eventually fell like a mass when examined by your caregiver. As with the other problems described above, this may or may not be associated with symptoms or feeling badly. °· Ectopic Pregnancy. This is a condition where the growing fetus is growing outside of the uterus. This is a common cause for a pelvic mass and may become a serious or life-threatening problem that requires immediate surgery. °SYMPTOMS  °In people with a pelvic mass, there may be a large variety of associated symptoms including:  °· No symptoms, other than the appearance of the mass itself. °· Cramping, nausea, diarrhea. °· Fever, vomiting, weakness. °· Pelvic, side, and/or back pain. °· Weight loss. °· Constipation. °· Problems with vaginal bleeding. This can be very variable. Bleeding might be very light or very heavy. Bleeding may be mixed with large clots. Menstruation may be very frequent and may seem to almost completely stop. There may be varying levels of pain with menstruation. °· Urinary symptoms including frequent urination, inability to empty the   bladder completely, or urinating very small amounts. °DIAGNOSIS  °Because of the large number of causes of a mass in the pelvis, your caregiver will ask you to undergo testing in order to get a clear diagnosis in a timely manner. The tests might include some or all of the following: °· Blood tests such as a blood count, measurement of common minerals in the blood, kidney/liver/pancreas function, pregnancy test, and others. °· X-rays. Plain x-rays  and special x-rays may be requested except if you are pregnant. °· Ultrasound. This is a test that uses sound waves to "paint a picture" of the mass. The type of "sound picture" that is seen can help to narrow the diagnosis. °· CAT scan and MRI imaging. Each can provide additional information as to the different characteristics of the mass and can help to develop a final plan for diagnostics and treatment. If cancer is suspected, these special tests can also help to show any spread of the cancer to other parts of the body. It is possible that these tests may not be ordered if you are pregnant. °· Laparoscopy. This is a special exam of the inside of the pelvic area using a slim, flexible, lighted tube. This allows your caregiver to get a direct look at the mass. Sometimes, this allows getting a very small piece of the mass (a biopsy). This piece of tissue can then be examined in a lab that will frequently lead to a clear diagnosis. In some cases, your caregiver can use a laparoscope to completely remove the mass after it has been examined. °· Surgery. Sometimes, a diagnosis can only be made by carrying out an operation and obtaining a biopsy (as noted above). Many times, the biopsy is obtained and the mass is removed during the same operation. °These are the most common ways for determining the exact cause of the mass. Your caregiver may recommend other tests that are not listed here. °TREATMENT  °Treatment(s) can only be recommended after a diagnosis is made. Your caregiver will discuss your test results with you, the meanings of the tests, and the recommended steps to begin treatment. He/she will also recommend whether you need to be examined by specialists as you go through the steps of diagnosis and a treatment plan is developed. °HOME CARE INSTRUCTIONS  °· Test preparation. Carefully follow instructions when preparing for certain tests. This may involve many things such as: °¨ Drinking fluids to fill the bladder  before a pelvic ultrasound. °¨ Fasting before certain blood tests. °¨ Drinking special "contrast" fluids that are necessary for obtaining the best CAT scan and MRI images. °· Medications. Your caregiver may prescribe medications to help relieve symptoms while you undergo testing. It is important that your current medications (prescription, non-prescription, herbal, vitamins, etc.) be kept in mind when new prescriptions are recommended. °· Diet. There may be a need for changes in diet in order to help with symptom relief while testing is being done. If this applies to you, your caregiver will discuss these changes with you. °SEEK MEDICAL CARE IF:  °· You cannot hold down any of the recommended fluids used to prepare for tests such as CAT scan MRI. °· You feel that you are having trouble with any new prescriptions. °· You develop new symptoms of pain, vomiting, diarrhea, fever, or other problems that you did not feel since your last exam. °· You experience inability to empty your bladder completely or develop painful and/or bloody urination. °SEEK IMMEDIATE MEDICAL CARE IF:  °·   You vomit bright red blood, or a coffee ground appearing material. °· You have blood in your stools, or the stools turn black and tarry. °· You have an abnormal or increased amount of vaginal bleeding. °· You have a fever. °· You develop easy bruising or bleeding. °· You develop pain that is not controlled by your medication. °· You feel worsening weakness or you have a fainting episode. °· You feel that the mass has suddenly gotten larger. °· You develop severe bloating in the abdomen and/or pelvis. °· You cannot pass any urine. °MAKE SURE YOU:  °· Understand these instructions. °· Will watch your condition. °· Will get help right away if you are not doing well or get worse. °Document Released: 10/08/2006 Document Revised: 09/23/2011 Document Reviewed: 06/16/2007 °[employer]® Patient Information ©2015 [employer]. This information is not  intended to replace advice given to you by your health care provider. Make sure you discuss any questions you have with your health care provider. ° °

## 2014-01-11 NOTE — ED Notes (Signed)
Pt c/o lower back pain, lower abd pain , feeling that she is not able to empty her bladder that started Friday,

## 2014-01-11 NOTE — ED Provider Notes (Signed)
CSN: [id number]     Arrival date & time 01/11/14  1026 History  This chart was scribed for Joya Gaskinsonald W Mearl Olver, MD by Ardelia Memsylan Malpass, ED Scribe. The patient was seen in [facility]. The patient's care was started at 11:31 AM.    Chief Complaint  Patient presents with  . Abdominal Pain   Patient is a 38 y.o. female presenting with abdominal pain. The history is provided by the patient.  Abdominal Pain Pain location:  Suprapubic Pain radiates to:  Does not radiate Pain severity:  Moderate Onset quality:  Gradual Duration:  4 days Timing:  Intermittent Progression:  Waxing and waning Chronicity:  New Relieved by:  None tried Worsened by:  Nothing tried Ineffective treatments:  None tried Associated symptoms: nausea and vomiting   Associated symptoms: no diarrhea, no fever, no hematuria and no vaginal bleeding     HPI Comments: Norma [name] is a 38 y.o. female who presents to the Emergency Department complaining of intermittent, moderate suprapubic abdominal pain onset 4 days ago. She reports associated lower back pain, nausea and emesis. She denies any associated diarrhea, vaginal bleeding or dysuria. She denies any surgical abdominal history. She denies any prior history of similar pain.   Past Medical History  Diagnosis Date  . COPD (chronic obstructive pulmonary disease)   . Renal disorder     Kidney stones  . Hypertension   . IBS (irritable bowel syndrome)    Past Surgical History  Procedure Laterality Date  . Dilation and curettage of uterus     History reviewed. No pertinent family history. History  Substance Use Topics  . Smoking status: Current Every Day Smoker -- 2.00 packs/day  . Smokeless tobacco: Not on file  . Alcohol Use: Yes     Comment: occasionally   OB History   Grav Para Term Preterm Abortions TAB SAB Ect Mult Living                 Review of Systems  Constitutional: Negative for fever.  Gastrointestinal: Positive for nausea, vomiting and  abdominal pain. Negative for diarrhea.  Genitourinary: Negative for hematuria and vaginal bleeding.  All other systems reviewed and are negative.   Allergies  Review of patient's allergies indicates no known allergies.  Home Medications   Prior to Admission medications   Medication Sig Start Date End Date Taking? Authorizing Provider  albuterol (PROVENTIL HFA;VENTOLIN HFA) 108 (90 BASE) MCG/ACT inhaler Inhale 2 puffs into the lungs every 6 (six) hours as needed for wheezing.   Yes Historical Provider, MD  albuterol (PROVENTIL) (2.5 MG/3ML) 0.083% nebulizer solution Take 2.5 mg by nebulization every 6 (six) hours as needed for wheezing.   Yes Historical Provider, MD  ALPRAZolam Prudy Feeler(XANAX) 1 MG tablet Take 1 mg by mouth 3 (three) times daily as needed for anxiety.   Yes Historical Provider, MD  budesonide-formoterol (SYMBICORT) 160-4.5 MCG/ACT inhaler Inhale 2 puffs into the lungs 2 (two) times daily.   Yes Historical Provider, MD  cyclobenzaprine (FLEXERIL) 10 MG tablet Take 10 mg by mouth 3 (three) times daily as needed for muscle spasms.   Yes Historical Provider, MD  HYDROcodone-acetaminophen (NORCO) 7.5-325 MG per tablet Take 1 tablet by mouth every 4 (four) hours as needed for moderate pain. 10/27/13  Yes Kathie DikeHobson M Bryant, PA-C  Linaclotide (LINZESS) 145 MCG CAPS capsule Take 145 mcg by mouth daily.   Yes Historical Provider, MD  triamterene-hydrochlorothiazide (MAXZIDE-25) 37.5-25 MG per tablet Take 1 tablet by mouth daily.   Yes  Historical Provider, MD   Triage Vitals:  BP 134/92  Pulse 112  Temp(Src) 98.7 F (37.1 C) (Oral)  Resp 20  Ht 5\' 2"  (1.575 m)  Wt 200 lb (90.719 kg)  BMI 36.57 kg/m2  SpO2 99%  LMP 01/02/2014  Physical Exam  Nursing note and vitals reviewed. CONSTITUTIONAL: Well developed/well nourished HEAD: Normocephalic/atraumatic EYES: EOMI/PERRL ENMT: Mucous membranes moist NECK: supple no meningeal signs SPINE:entire spine nontender CV: S1/S2 noted, no  murmurs/rubs/gallops noted LUNGS: Lungs are clear to auscultation bilaterally, no apparent distress ABDOMEN: soft, moderate bilateral lower quadrant tenderness, no rebound or guarding GU:no cva tenderness, no vag bleeding noted, small amt of clear vaginal discharge.  No cmt.  She has bilateral adnexal tenderness without mass noted.  Female chaperone present NEURO: Pt is awake/alert, moves all extremitiesx4 EXTREMITIES: pulses normal, full ROM SKIN: warm, color normal PSYCH: no abnormalities of mood noted   ED Course  Procedures   DIAGNOSTIC STUDIES: Oxygen Saturation is 99% on RA, normal by my interpretation.    COORDINATION OF CARE: 11:33 AM- Discussed plan to obtain diagnostic lab work. Will also order Zofran and Morphine. Patient informed of current plan for treatment and evaluation and agrees with plan at this time. 4:01 PM US imaging shows probable endometrioma.  Pt low risk for PID/TOA (denies intercourse for past year, no WBC elevation, no fever) D/w dr Despina Hiddeneure Recommends megace 40mg  daily and will see in 2-3 weeks for followup Pt discused need for followup She was advised to not smoke while taking megace due to risk of VTE. Pt denies h/o DVT/PE Labs Review Labs Reviewed  URINALYSIS, ROUTINE W REFLEX MICROSCOPIC - Abnormal; Notable for the following:    Hgb urine dipstick TRACE (*)    All other components within normal limits  URINE MICROSCOPIC-ADD ON - Abnormal; Notable for the following:    Squamous Epithelial / LPF MANY (*)    All other components within normal limits  CBC WITH DIFFERENTIAL - Abnormal; Notable for the following:    Neutrophils Relative % 84 (*)    Neutro Abs 8.8 (*)    Lymphocytes Relative 11 (*)    All other components within normal limits  WET PREP, GENITAL  GC/CHLAMYDIA PROBE AMP  PREGNANCY, URINE  BASIC METABOLIC PANEL    Imaging Review Ct Abdomen Pelvis W Contrast  01/11/2014   CLINICAL DATA:  Lower abdominal pain  EXAM: CT ABDOMEN AND PELVIS  WITH CONTRAST  TECHNIQUE: Multidetector CT imaging of the abdomen and pelvis was performed using the standard protocol following bolus administration of intravenous contrast.  CONTRAST:  100mL OMNIPAQUE IOHEXOL 300 MG/ML  SOLN  COMPARISON:  None.  FINDINGS: Complex cystic pelvic mass is identified measuring 6.5 x 8.1 cm. There are septations within the mass and mild enhancement of the wall which is mildly irregular and mildly thickened in areas. The mass is located superior to the uterus and extends more to the left than the right. Normal ovarian tissue not identified. Uterus normal in size.  Mild stranding in the mesentery is noted above the mass. This may be secondary inflammation. Small amount of peritoneal fluid is present in the with right lower quadrant anteriorly. No bowel thickening or obstruction.  The appendix is normal.  Lung bases are clear. Liver gallbladder and bile ducts are normal. Pancreas and spleen are normal. Kidneys show no obstruction or mass. 7 mm nonobstructing right lower pole stone.  Negative for adenopathy.  IMPRESSION: Complex cystic mass in the pelvis measuring 6.5 x 8.1 cm.  There is mild infiltration of the mesentery which may be due to secondary inflammation. Differential includes tubo-ovarian cyst and ovarian cystic neoplasm. Pelvic ultrasound suggested for further evaluation at this time. Pelvic MRI with contrast may be helpful if there is need for further imaging evaluation.   Electronically Signed   By: Marlan Palau M.D.   On: 01/11/2014 14:36    MDM   Final diagnoses:  Pelvic mass    Nursing notes including past medical history and social history reviewed and considered in documentation Labs/vital reviewed and considered    I personally performed the services described in this documentation, which was scribed in my presence. The recorded information has been reviewed and is accurate.     Joya Gaskins, MD 01/11/14 [phone]

## 2014-01-12 LAB — GC/CHLAMYDIA PROBE AMP
CT Probe RNA: NEGATIVE
GC Probe RNA: NEGATIVE

## 2014-01-18 ENCOUNTER — Encounter: Payer: Self-pay | Admitting: Obstetrics & Gynecology

## 2014-07-04 ENCOUNTER — Emergency Department (HOSPITAL_COMMUNITY): Payer: Medicaid Other

## 2014-07-04 ENCOUNTER — Encounter (HOSPITAL_COMMUNITY): Payer: Self-pay | Admitting: Cardiology

## 2014-07-04 ENCOUNTER — Emergency Department (HOSPITAL_COMMUNITY)
Admission: EM | Admit: 2014-07-04 | Discharge: 2014-07-04 | Disposition: A | Discharge status: Home / Self Care | Payer: Medicaid Other | Attending: Emergency Medicine | Admitting: Emergency Medicine

## 2014-07-04 DIAGNOSIS — N390 Urinary tract infection, site not specified: Secondary | ICD-10-CM | POA: Insufficient documentation

## 2014-07-04 DIAGNOSIS — I10 Essential (primary) hypertension: Secondary | ICD-10-CM | POA: Diagnosis not present

## 2014-07-04 DIAGNOSIS — R109 Unspecified abdominal pain: Secondary | ICD-10-CM | POA: Diagnosis present

## 2014-07-04 DIAGNOSIS — Z8619 Personal history of other infectious and parasitic diseases: Secondary | ICD-10-CM | POA: Insufficient documentation

## 2014-07-04 DIAGNOSIS — Z720 Tobacco use: Secondary | ICD-10-CM | POA: Diagnosis not present

## 2014-07-04 DIAGNOSIS — R102 Pelvic and perineal pain: Secondary | ICD-10-CM

## 2014-07-04 DIAGNOSIS — J449 Chronic obstructive pulmonary disease, unspecified: Secondary | ICD-10-CM | POA: Insufficient documentation

## 2014-07-04 DIAGNOSIS — R1084 Generalized abdominal pain: Secondary | ICD-10-CM

## 2014-07-04 DIAGNOSIS — Z79899 Other long term (current) drug therapy: Secondary | ICD-10-CM | POA: Diagnosis not present

## 2014-07-04 DIAGNOSIS — Z87442 Personal history of urinary calculi: Secondary | ICD-10-CM | POA: Diagnosis not present

## 2014-07-04 DIAGNOSIS — Z3202 Encounter for pregnancy test, result negative: Secondary | ICD-10-CM | POA: Diagnosis not present

## 2014-07-04 DIAGNOSIS — Z9889 Other specified postprocedural states: Secondary | ICD-10-CM | POA: Insufficient documentation

## 2014-07-04 LAB — URINE MICROSCOPIC-ADD ON

## 2014-07-04 LAB — BASIC METABOLIC PANEL
Anion gap: 12 (ref 5–15)
BUN: 8 mg/dL (ref 6–23)
CHLORIDE: 103 meq/L (ref 96–112)
CO2: 26 mEq/L (ref 19–32)
CREATININE: 0.62 mg/dL (ref 0.50–1.10)
Calcium: 9.1 mg/dL (ref 8.4–10.5)
GFR calc non Af Amer: >90 mL/min (ref >90)
Glucose, Bld: 108 mg/dL — ABNORMAL HIGH (ref 70–99)
POTASSIUM: 4.1 meq/L (ref 3.7–5.3)
Sodium: 141 mEq/L (ref 137–147)

## 2014-07-04 LAB — CBC WITH DIFFERENTIAL/PLATELET
BASOS PCT: 0 % (ref 0–1)
Basophils Absolute: 0 10*3/uL (ref 0.0–0.1)
Eosinophils Absolute: 0.1 10*3/uL (ref 0.0–0.7)
Eosinophils Relative: 1 % (ref 0–5)
HCT: 40.3 % (ref 36.0–46.0)
Hemoglobin: 13.1 g/dL (ref 12.0–15.0)
Lymphocytes Relative: 14 % (ref 12–46)
Lymphs Abs: 1.3 10*3/uL (ref 0.7–4.0)
MCH: 30 pg (ref 26.0–34.0)
MCHC: 32.5 g/dL (ref 30.0–36.0)
MCV: 92.4 fL (ref 78.0–100.0)
Monocytes Absolute: 0.4 10*3/uL (ref 0.1–1.0)
Monocytes Relative: 5 % (ref 3–12)
NEUTROS ABS: 7.5 10*3/uL (ref 1.7–7.7)
NEUTROS PCT: 80 % — AB (ref 43–77)
Platelets: 300 10*3/uL (ref 150–400)
RBC: 4.36 MIL/uL (ref 3.87–5.11)
RDW: 14.3 % (ref 11.5–15.5)
WBC: 9.3 10*3/uL (ref 4.0–10.5)

## 2014-07-04 LAB — URINALYSIS, ROUTINE W REFLEX MICROSCOPIC
Bilirubin Urine: NEGATIVE
Glucose, UA: NEGATIVE mg/dL
Ketones, ur: NEGATIVE mg/dL
NITRITE: NEGATIVE
Protein, ur: 30 mg/dL — AB
SPECIFIC GRAVITY, URINE: 1.025 (ref 1.005–1.030)
UROBILINOGEN UA: 0.2 mg/dL (ref 0.0–1.0)
pH: 6 (ref 5.0–8.0)

## 2014-07-04 LAB — PREGNANCY, URINE: Preg Test, Ur: NEGATIVE

## 2014-07-04 MED ORDER — ONDANSETRON HCL 4 MG/2ML IJ SOLN
4.0000 mg | Freq: Once | INTRAMUSCULAR | Status: AC
  Administered 2014-07-04: 4 mg via INTRAVENOUS
  Filled 2014-07-04: qty 2

## 2014-07-04 MED ORDER — FENTANYL CITRATE 0.05 MG/ML IJ SOLN
50.0000 ug | Freq: Once | INTRAMUSCULAR | Status: AC
  Administered 2014-07-04: 50 ug via INTRAVENOUS
  Filled 2014-07-04: qty 2

## 2014-07-04 MED ORDER — MORPHINE SULFATE 4 MG/ML IJ SOLN
4.0000 mg | Freq: Once | INTRAMUSCULAR | Status: AC
  Administered 2014-07-04: 4 mg via INTRAVENOUS
  Filled 2014-07-04: qty 1

## 2014-07-04 MED ORDER — CEPHALEXIN 500 MG PO CAPS: 500.0000 mg | ORAL_CAPSULE | Freq: Four times a day (QID) | ORAL | Status: DC

## 2014-07-04 MED ORDER — OXYCODONE-ACETAMINOPHEN 5-325 MG PO TABS: 1.0000 | ORAL_TABLET | ORAL | Status: DC | PRN

## 2014-07-04 MED ORDER — IOHEXOL 300 MG/ML  SOLN
100.0000 mL | Freq: Once | INTRAMUSCULAR | Status: AC | PRN
  Administered 2014-07-04: 100 mL via INTRAVENOUS

## 2014-07-04 MED ORDER — ONDANSETRON HCL 4 MG/2ML IJ SOLN
4.0000 mg | Freq: Once | INTRAMUSCULAR | Status: AC
  Administered 2014-07-04: 4 mg via INTRAMUSCULAR
  Filled 2014-07-04: qty 2

## 2014-07-04 MED ORDER — IOHEXOL 300 MG/ML  SOLN
25.0000 mL | Freq: Once | INTRAMUSCULAR | Status: AC | PRN
  Administered 2014-07-04: 25 mL via ORAL

## 2014-07-04 NOTE — ED Notes (Signed)
Patient with no complaints at this time. Respirations even and unlabored. Skin warm/dry. Discharge instructions reviewed with patient at this time. Patient given opportunity to voice concerns/ask questions. IV removed per policy and band-aid applied to site. Patient discharged at this time and left Emergency Department with steady gait.  

## 2014-07-04 NOTE — ED Provider Notes (Signed)
CSN: [id number]     Arrival date & time 07/04/14  1154 History   First MD Initiated Contact with Patient 07/04/14 1302     Chief Complaint  Patient presents with  . Abdominal Pain     (Consider location/radiation/quality/duration/timing/severity/associated sxs/prior Treatment) HPI Comments: Patient complains of severe, constant abdominal pain "all over" which began this morning. Comes to the ER by ambulance, received zofran with little improvement. Has had vomiting, no diarrhea.   Patient is a 38 y.o. female presenting with abdominal pain.  Abdominal Pain Associated symptoms: nausea and vomiting     Past Medical History  Diagnosis Date  . COPD (chronic obstructive pulmonary disease)   . Renal disorder     Kidney stones  . Hypertension   . IBS (irritable bowel syndrome)    Past Surgical History  Procedure Laterality Date  . Dilation and curettage of uterus     History reviewed. No pertinent family history. History  Substance Use Topics  . Smoking status: Current Every Day Smoker -- 2.00 packs/day  . Smokeless tobacco: Not on file  . Alcohol Use: Yes     Comment: occasionally   OB History    No data available     Review of Systems  Gastrointestinal: Positive for nausea, vomiting and abdominal pain.  All other systems reviewed and are negative.     Allergies  Review of patient's allergies indicates no known allergies.  Home Medications   Prior to Admission medications   Medication Sig Start Date End Date Taking? Authorizing Provider  acetaminophen (TYLENOL) 650 MG CR tablet Take 1,300 mg by mouth every 8 (eight) hours as needed for pain.   Yes Historical Provider, MD  albuterol (PROVENTIL HFA;VENTOLIN HFA) 108 (90 BASE) MCG/ACT inhaler Inhale 2 puffs into the lungs every 6 (six) hours as needed for wheezing.   Yes Historical Provider, MD  albuterol (PROVENTIL) (2.5 MG/3ML) 0.083% nebulizer solution Take 2.5 mg by nebulization every 6 (six) hours as needed for  wheezing.   Yes Historical Provider, MD  ALPRAZolam Prudy Feeler) 1 MG tablet Take 1 mg by mouth 3 (three) times daily as needed for anxiety.   Yes Historical Provider, MD  budesonide-formoterol (SYMBICORT) 160-4.5 MCG/ACT inhaler Inhale 2 puffs into the lungs 2 (two) times daily.   Yes Historical Provider, MD  ibuprofen (ADVIL,MOTRIN) 200 MG tablet Take 800 mg by mouth every 6 (six) hours as needed for moderate pain.   Yes Historical Provider, MD  triamterene-hydrochlorothiazide (MAXZIDE-25) 37.5-25 MG per tablet Take 1 tablet by mouth daily.   Yes Historical Provider, MD  megestrol (MEGACE) 40 MG tablet Take 1 tablet (40 mg total) by mouth daily. Patient not taking: Reported on 07/04/2014 01/11/14   Joya Gaskins, MD  oxyCODONE-acetaminophen (PERCOCET/ROXICET) 5-325 MG per tablet Take 1 tablet by mouth every 4 (four) hours as needed for severe pain. Patient not taking: Reported on 07/04/2014 01/11/14   Joya Gaskins, MD   BP 138/91 mmHg  Pulse 100  Temp(Src) 98.1 F (36.7 C) (Oral)  Resp 18  Ht 5\' 2"  (1.575 m)  Wt 220 lb (99.791 kg)  BMI 40.23 kg/m2  SpO2 97%  LMP 06/20/2014 Physical Exam  Constitutional: She is oriented to person, place, and time. She appears well-developed and well-nourished. No distress.  HENT:  Head: Normocephalic and atraumatic.  Right Ear: Hearing normal.  Left Ear: Hearing normal.  Nose: Nose normal.  Mouth/Throat: Oropharynx is clear and moist and mucous membranes are normal.  Eyes: Conjunctivae and EOM are  normal. Pupils are equal, round, and reactive to light.  Neck: Normal range of motion. Neck supple.  Cardiovascular: Regular rhythm, S1 normal and S2 normal.  Exam reveals no gallop and no friction rub.   No murmur heard. Pulmonary/Chest: Effort normal and breath sounds normal. No respiratory distress. She exhibits no tenderness.  Abdominal: Soft. Normal appearance and bowel sounds are normal. There is no hepatosplenomegaly. There is generalized  tenderness. There is no rebound, no guarding, no tenderness at McBurney's point and negative Murphy's sign. No hernia.  Musculoskeletal: Normal range of motion.  Neurological: She is alert and oriented to person, place, and time. She has normal strength. No cranial nerve deficit or sensory deficit. Coordination normal. GCS eye subscore is 4. GCS verbal subscore is 5. GCS motor subscore is 6.  Skin: Skin is warm, dry and intact. No rash noted. No cyanosis.  Psychiatric: She has a normal mood and affect. Her speech is normal and behavior is normal. Thought content normal.  Nursing note and vitals reviewed.   ED Course  Procedures (including critical care time) Labs Review Labs Reviewed  CBC WITH DIFFERENTIAL - Abnormal; Notable for the following:    Neutrophils Relative % 80 (*)    All other components within normal limits  BASIC METABOLIC PANEL - Abnormal; Notable for the following:    Glucose, Bld 108 (*)    All other components within normal limits  URINALYSIS, ROUTINE W REFLEX MICROSCOPIC - Abnormal; Notable for the following:    Color, Urine AMBER (*)    APPearance HAZY (*)    Hgb urine dipstick TRACE (*)    Protein, ur 30 (*)    Leukocytes, UA TRACE (*)    All other components within normal limits  URINE MICROSCOPIC-ADD ON - Abnormal; Notable for the following:    Squamous Epithelial / LPF FEW (*)    Bacteria, UA MANY (*)    All other components within normal limits  PREGNANCY, URINE    Imaging Review US Transvaginal Non-ob  07/04/2014   CLINICAL DATA:  Pelvic pain with left adnexal mass on CT. Initial encounter.  EXAM: TRANSABDOMINAL AND TRANSVAGINAL ULTRASOUND OF PELVIS  DOPPLER ULTRASOUND OF OVARIES  TECHNIQUE: Both transabdominal and transvaginal ultrasound examinations of the pelvis were performed. Transabdominal technique was performed for global imaging of the pelvis including uterus, ovaries, adnexal regions, and pelvic cul-de-sac.  It was necessary to proceed with  endovaginal exam following the transabdominal exam to visualize the endometrium and ovaries to better advantage. Color and duplex Doppler ultrasound was utilized to evaluate blood flow to the ovaries.  COMPARISON:  Pelvic ultrasound 07/04/2014 and 01/11/2014. Pelvic ultrasound 01/11/2014 and 09/23/2008.  FINDINGS: Uterus  Measurements: 2.3 x 4.8 x 5.6 cm. No fibroids or other mass visualized.  Endometrium  Thickness: 5.9 mm.  No focal abnormality visualized.  Right ovary  Measurements: 3.5 x 2.1 x 1.9 cm. There is a mildly complex cystic lesion within the right ovary which is likely a hemorrhagic follicle. This measures 2.0 cm maximally. There is blood flow within the right ovary on color Doppler.  Left ovary  Measurements: Not discretely visualized as on prior ultrasound. Again demonstrated is a large hypovascular mass within the left adnexa, measuring approximately 7.0 x 4.2 x 8.8 cm. Overall size and appearance are similar to the prior studies obtained 6 months ago.  Pulsed Doppler evaluation of both ovaries demonstrates normal low-resistance arterial and venous waveforms in the right ovary. The left ovary was not clearly visualized and could not  be evaluated.  Other findings  No free fluid.  IMPRESSION: 1. 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 Differential considerations include endometrioma, complex hydrosalpinx and ovarian neoplasm. 2. OB GYN consultation recommended given the persistence and size of this lesion, especially if not previously performed. Pelvic MRI without and with contrast could be performed for further characterization. 3. Nonvisualization of the left ovary. The right ovary shows no evidence of torsion.   Electronically Signed   By: Roxy HorsemanBill  Veazey M.D.   On: 07/04/2014 16:32   [place] Pelvis Complete  07/04/2014   CLINICAL DATA:  Pelvic pain with left adnexal mass on CT. Initial encounter.  EXAM: TRANSABDOMINAL  AND TRANSVAGINAL ULTRASOUND OF PELVIS  DOPPLER ULTRASOUND OF OVARIES  TECHNIQUE: Both transabdominal and transvaginal ultrasound examinations of the pelvis were performed. Transabdominal technique was performed for global imaging of the pelvis including uterus, ovaries, adnexal regions, and pelvic cul-de-sac.  It was necessary to proceed with endovaginal exam following the transabdominal exam to visualize the endometrium and ovaries to better advantage. Color and duplex Doppler ultrasound was utilized to evaluate blood flow to the ovaries.  COMPARISON:  Pelvic ultrasound 07/04/2014 and 01/11/2014. Pelvic ultrasound 01/11/2014 and 09/23/2008.  FINDINGS: Uterus  Measurements: 2.3 x 4.8 x 5.6 cm. No fibroids or other mass visualized.  Endometrium  Thickness: 5.9 mm.  No focal abnormality visualized.  Right ovary  Measurements: 3.5 x 2.1 x 1.9 cm. There is a mildly complex cystic lesion within the right ovary which is likely a hemorrhagic follicle. This measures 2.0 cm maximally. There is blood flow within the right ovary on color Doppler.  Left ovary  Measurements: Not discretely visualized as on prior ultrasound. Again demonstrated is a large hypovascular mass within the left adnexa, measuring approximately 7.0 x 4.2 x 8.8 cm. Overall size and appearance are similar to the prior studies obtained 6 months ago.  Pulsed Doppler evaluation of both ovaries demonstrates normal low-resistance arterial and venous waveforms in the right ovary. The left ovary was not clearly visualized and could not be evaluated.  Other findings  No free fluid.  IMPRESSION: 1. 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 Differential considerations include endometrioma, complex hydrosalpinx and ovarian neoplasm. 2. OB GYN consultation recommended given the persistence and size of this lesion, especially if not previously performed. Pelvic MRI without and with contrast  could be performed for further characterization. 3. Nonvisualization of the left ovary. The right ovary shows no evidence of torsion.   Electronically Signed   By: Roxy HorsemanBill  Veazey M.D.   On: 07/04/2014 16:32   Ct Abdomen Pelvis W Contrast  07/04/2014   CLINICAL DATA:  Lower abdominal pain.  Nausea and vomiting  EXAM: CT ABDOMEN AND PELVIS WITH CONTRAST  TECHNIQUE: Multidetector CT imaging of the abdomen and pelvis was performed using the standard protocol following bolus administration of intravenous contrast.  CONTRAST:  25mL OMNIPAQUE IOHEXOL 300 MG/ML SOLN, 100mL OMNIPAQUE IOHEXOL 300 MG/ML SOLN  COMPARISON:  CT 01/11/2014  FINDINGS: Lower chest:  Lung bases are clear.  Hepatobiliary: No focal hepatic lesion. No biliary duct dilatation. Gallbladder is normal. Common bile duct is normal. There is a trace amount of fluid along the right hepatic lobe (image 17, series 2.  Pancreas: Pancreas is normal. No ductal dilatation. No pancreatic inflammation.  Spleen: Normal spleen.  Adrenals/urinary tract: Normal adrenal glands and kidneys. There is a nonobstructing calculus  in lower pole of the right kidney measuring 6 mm. No ureterolithiasis or obstructive uropathy. Bladder is collapsed.  Stomach/Bowel: Stomach, small bowel are, and cecum normal. The appendix is small but not inflamed colon and rectosigmoid colon are normal.  Vascular/Lymphatic: Abdominal or is normal caliber. No retroperitoneal periportal lymphadenopathy.  Reproductive: Uterus is normal. There is a left adnexal mass measuring 7.6 x 4.9 cm. This left adnexal mass is immediately adjacent to the right ovary which measures 4.1 x 2\ 2.9 cm. The left adnexal mass and the right ovary do not have a discernible plane between them. Small amount of fluid in the peritoneal space above the bladder extending along the pericolic gutters. This is not the typical location for physiologic fluid.  Musculoskeletal: No aggressive osseous lesion.  IMPRESSION: 1. Left adnexal  mass. Differential includes ovarian torsion, cystic ovarian lesion, endometrioma, or large hemorrhagic cyst. Recommend a pelvic ultrasound with Doppler evaluation. 2. Small amount free fluid in the anterior pelvis presumably related to the left adnexal process. 3. Appendix appears normal. Findings conveyed toCHRISTOPHER POLLINA on 07/04/2014  at15:12.   Electronically Signed   By: Genevive Bi M.D.   On: 07/04/2014 15:14   [place] Art/ven Flow Abd Pelv Doppler  07/04/2014   CLINICAL DATA:  Pelvic pain with left adnexal mass on CT. Initial encounter.  EXAM: TRANSABDOMINAL AND TRANSVAGINAL ULTRASOUND OF PELVIS  DOPPLER ULTRASOUND OF OVARIES  TECHNIQUE: Both transabdominal and transvaginal ultrasound examinations of the pelvis were performed. Transabdominal technique was performed for global imaging of the pelvis including uterus, ovaries, adnexal regions, and pelvic cul-de-sac.  It was necessary to proceed with endovaginal exam following the transabdominal exam to visualize the endometrium and ovaries to better advantage. Color and duplex Doppler ultrasound was utilized to evaluate blood flow to the ovaries.  COMPARISON:  Pelvic ultrasound 07/04/2014 and 01/11/2014. Pelvic ultrasound 01/11/2014 and 09/23/2008.  FINDINGS: Uterus  Measurements: 2.3 x 4.8 x 5.6 cm. No fibroids or other mass visualized.  Endometrium  Thickness: 5.9 mm.  No focal abnormality visualized.  Right ovary  Measurements: 3.5 x 2.1 x 1.9 cm. There is a mildly complex cystic lesion within the right ovary which is likely a hemorrhagic follicle. This measures 2.0 cm maximally. There is blood flow within the right ovary on color Doppler.  Left ovary  Measurements: Not discretely visualized as on prior ultrasound. Again demonstrated is a large hypovascular mass within the left adnexa, measuring approximately 7.0 x 4.2 x 8.8 cm. Overall size and appearance are similar to the prior studies obtained 6 months ago.  Pulsed Doppler evaluation of both  ovaries demonstrates normal low-resistance arterial and venous waveforms in the right ovary. The left ovary was not clearly visualized and could not be evaluated.  Other findings  No free fluid.  IMPRESSION: 1. 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 Differential considerations include endometrioma, complex hydrosalpinx and ovarian neoplasm. 2. OB GYN consultation recommended given the persistence and size of this lesion, especially if not previously performed. Pelvic MRI without and with contrast could be performed for further characterization. 3. Nonvisualization of the left ovary. The right ovary shows no evidence of torsion.   Electronically Signed   By: Roxy Horseman M.D.   On: 07/04/2014 16:32     EKG Interpretation None      MDM   Final diagnoses:  Abdominal pain  Pelvic pain in female    Patient presents to the ER for evaluation of  abdominal pain. Pain has been diffuse. She has components well above the umbilicus but some in the low abdomen as well. Patient reports concomitant nausea and vomiting. Patient has improved here in the ER with analgesia and IV fluids with Zofran. Lab work was unremarkable other than evidence of urinary tract infection which will be treated. A CT scan was performed to evaluate for diffuse abdominal discomfort. CT scan showed evidence of enlargement of the left adnexa which it was recommended to further evaluate with ultrasound. This was performed, there is normal blood flow to both ovaries, no evidence of torsion. There is a hypovascular left adnexal mass of unclear etiology. It appears similar to the right appeared 6 months ago on CT and ultrasound. Patient will be referred to OB/GYN for further evaluation of this. I did inform her that we cannot rule out ovarian neoplasm, which necessitates follow-up.  Gilda Creasehristopher J. Pollina, MD 07/04/14 2142

## 2014-07-04 NOTE — Discharge Instructions (Signed)
Abdominal Pain Many things can cause abdominal pain. Usually, abdominal pain is not caused by a disease and will improve without treatment. It can often be observed and treated at home. Your health care provider will do a physical exam and possibly order blood tests and X-rays to help determine the seriousness of your pain. However, in many cases, more time must pass before a clear cause of the pain can be found. Before that point, your health care provider may not know if you need more testing or further treatment. HOME CARE INSTRUCTIONS  Monitor your abdominal pain for any changes. The following actions may help to alleviate any discomfort you are experiencing:  Only take over-the-counter or prescription medicines as directed by your health care provider.  Do not take laxatives unless directed to do so by your health care provider.  Try a clear liquid diet (broth, tea, or water) as directed by your health care provider. Slowly move to a bland diet as tolerated. SEEK MEDICAL CARE IF:  You have unexplained abdominal pain.  You have abdominal pain associated with nausea or diarrhea.  You have pain when you urinate or have a bowel movement.  You experience abdominal pain that wakes you in the night.  You have abdominal pain that is worsened or improved by eating food.  You have abdominal pain that is worsened with eating fatty foods.  You have a fever. SEEK IMMEDIATE MEDICAL CARE IF:   Your pain does not go away within 2 hours.  You keep throwing up (vomiting).  Your pain is felt only in portions of the abdomen, such as the right side or the left lower portion of the abdomen.  You pass bloody or black tarry stools. MAKE SURE YOU:  Understand these instructions.   Will watch your condition.   Will get help right away if you are not doing well or get worse.  Document Released: 04/10/2005 Document Revised: 07/06/2013 Document Reviewed: 03/10/2013 [facility] Patient Information  2015 [place], [place]. This information is not intended to replace advice given to you by your health care provider. Make sure you discuss any questions you have with your health care provider.  Pelvic Pain Female pelvic pain can be caused by many different things and start from a variety of places. Pelvic pain refers to pain that is located in the lower half of the abdomen and between your hips. The pain may occur over a short period of time (acute) or may be reoccurring (chronic). The cause of pelvic pain may be related to disorders affecting the female reproductive organs (gynecologic), but it may also be related to the bladder, kidney stones, an intestinal complication, or muscle or skeletal problems. Getting help right away for pelvic pain is important, especially if there has been severe, sharp, or a sudden onset of unusual pain. It is also important to get help right away because some types of pelvic pain can be life threatening.  CAUSES  Below are only some of the causes of pelvic pain. The causes of pelvic pain can be in one of several categories.   Gynecologic.  Pelvic inflammatory disease.  Sexually transmitted infection.  Ovarian cyst or a twisted ovarian ligament (ovarian torsion).  Uterine lining that grows outside the uterus (endometriosis).  Fibroids, cysts, or tumors.  Ovulation.  Pregnancy.  Pregnancy that occurs outside the uterus (ectopic pregnancy).  Miscarriage.  Labor.  Abruption of the placenta or ruptured uterus.  Infection.  Uterine infection (endometritis).  Bladder infection.  Diverticulitis.  Miscarriage  related to a uterine infection (septic abortion).  Bladder.  Inflammation of the bladder (cystitis).  Kidney stone(s).  Gastrointestinal.  Constipation.  Diverticulitis.  Neurologic.  Trauma.  Feeling pelvic pain because of mental or emotional causes (psychosomatic).  Cancers of the bowel or pelvis. EVALUATION  Your caregiver  will want to take a careful history of your concerns. This includes recent changes in your health, a careful gynecologic history of your periods (menses), and a sexual history. Obtaining your family history and medical history is also important. Your caregiver may suggest a pelvic exam. A pelvic exam will help identify the location and severity of the pain. It also helps in the evaluation of which organ system may be involved. In order to identify the cause of the pelvic pain and be properly treated, your caregiver may order tests. These tests may include:   A pregnancy test.  Pelvic ultrasonography.  An X-ray exam of the abdomen.  A urinalysis or evaluation of vaginal discharge.  Blood tests. HOME CARE INSTRUCTIONS   Only take over-the-counter or prescription medicines for pain, discomfort, or fever as directed by your caregiver.   Rest as directed by your caregiver.   Eat a balanced diet.   Drink enough fluids to make your urine clear or pale yellow, or as directed.   Avoid sexual intercourse if it causes pain.   Apply warm or cold compresses to the lower abdomen depending on which one helps the pain.   Avoid stressful situations.   Keep a journal of your pelvic pain. Write down when it started, where the pain is located, and if there are things that seem to be associated with the pain, such as food or your menstrual cycle.  Follow up with your caregiver as directed.  SEEK MEDICAL CARE IF:  Your medicine does not help your pain.  You have abnormal vaginal discharge. SEEK IMMEDIATE MEDICAL CARE IF:   You have heavy bleeding from the vagina.   Your pelvic pain increases.   You feel light-headed or faint.   You have chills.   You have pain with urination or blood in your urine.   You have uncontrolled diarrhea or vomiting.   You have a fever or persistent symptoms for more than 3 days.  You have a fever and your symptoms suddenly get worse.   You are  being physically or sexually abused.  MAKE SURE YOU:  Understand these instructions.  Will watch your condition.  Will get help if you are not doing well or get worse. Document Released: 05/28/2004 Document Revised: 11/15/2013 Document Reviewed: 10/21/2011 [facility] Patient Information 2015 [place], [place]. This information is not intended to replace advice given to you by your health care provider. Make sure you discuss any questions you have with your health care provider.  Urinary Tract Infection Urinary tract infections (UTIs) can develop anywhere along your urinary tract. Your urinary tract is your body's drainage system for removing wastes and extra water. Your urinary tract includes two kidneys, two ureters, a bladder, and a urethra. Your kidneys are a pair of bean-shaped organs. Each kidney is about the size of your fist. They are located below your ribs, one on each side of your spine. CAUSES Infections are caused by microbes, which are microscopic organisms, including fungi, viruses, and bacteria. These organisms are so small that they can only be seen through a microscope. Bacteria are the microbes that most commonly cause UTIs. SYMPTOMS  Symptoms of UTIs may vary by age and gender of  the patient and by the location of the infection. Symptoms in young women typically include a frequent and intense urge to urinate and a painful, burning feeling in the bladder or urethra during urination. Older women and men are more likely to be tired, shaky, and weak and have muscle aches and abdominal pain. A fever may mean the infection is in your kidneys. Other symptoms of a kidney infection include pain in your back or sides below the ribs, nausea, and vomiting. DIAGNOSIS To diagnose a UTI, your caregiver will ask you about your symptoms. Your caregiver also will ask to provide a urine sample. The urine sample will be tested for bacteria and white blood cells. White blood cells are made by your body  to help fight infection. TREATMENT  Typically, UTIs can be treated with medication. Because most UTIs are caused by a bacterial infection, they usually can be treated with the use of antibiotics. The choice of antibiotic and length of treatment depend on your symptoms and the type of bacteria causing your infection. HOME CARE INSTRUCTIONS  If you were prescribed antibiotics, take them exactly as your caregiver instructs you. Finish the medication even if you feel better after you have only taken some of the medication.  Drink enough water and fluids to keep your urine clear or pale yellow.  Avoid caffeine, tea, and carbonated beverages. They tend to irritate your bladder.  Empty your bladder often. Avoid holding urine for long periods of time.  Empty your bladder before and after sexual intercourse.  After a bowel movement, women should cleanse from front to back. Use each tissue only once. SEEK MEDICAL CARE IF:   You have back pain.  You develop a fever.  Your symptoms do not begin to resolve within 3 days. SEEK IMMEDIATE MEDICAL CARE IF:   You have severe back pain or lower abdominal pain.  You develop chills.  You have nausea or vomiting.  You have continued burning or discomfort with urination. MAKE SURE YOU:   Understand these instructions.  Will watch your condition.  Will get help right away if you are not doing well or get worse. Document Released: 04/10/2005 Document Revised: 12/31/2011 Document Reviewed: 08/09/2011 [facility] Patient Information 2015 [place], [place]. This information is not intended to replace advice given to you by your health care provider. Make sure you discuss any questions you have with your health care provider.

## 2014-07-04 NOTE — ED Notes (Signed)
Woke up this morning with lower abdominal pain.  Nausea and vomiting.   EMS gave zofran 4 mg.

## 2014-07-22 ENCOUNTER — Ambulatory Visit: Payer: Medicaid Other | Admitting: Obstetrics and Gynecology

## 2015-09-15 ENCOUNTER — Emergency Department (HOSPITAL_COMMUNITY): Payer: Medicaid Other

## 2015-09-15 ENCOUNTER — Encounter (HOSPITAL_COMMUNITY): Payer: Self-pay | Admitting: Emergency Medicine

## 2015-09-15 ENCOUNTER — Emergency Department (HOSPITAL_COMMUNITY)
Admission: EM | Admit: 2015-09-15 | Discharge: 2015-09-15 | Disposition: A | Discharge status: Home / Self Care | Payer: Medicaid Other | Attending: Emergency Medicine | Admitting: Emergency Medicine

## 2015-09-15 DIAGNOSIS — I10 Essential (primary) hypertension: Secondary | ICD-10-CM | POA: Diagnosis not present

## 2015-09-15 DIAGNOSIS — R1904 Left lower quadrant abdominal swelling, mass and lump: Secondary | ICD-10-CM | POA: Diagnosis not present

## 2015-09-15 DIAGNOSIS — Z79899 Other long term (current) drug therapy: Secondary | ICD-10-CM | POA: Insufficient documentation

## 2015-09-15 DIAGNOSIS — F17210 Nicotine dependence, cigarettes, uncomplicated: Secondary | ICD-10-CM | POA: Diagnosis not present

## 2015-09-15 DIAGNOSIS — J449 Chronic obstructive pulmonary disease, unspecified: Secondary | ICD-10-CM | POA: Insufficient documentation

## 2015-09-15 DIAGNOSIS — N390 Urinary tract infection, site not specified: Secondary | ICD-10-CM | POA: Diagnosis not present

## 2015-09-15 DIAGNOSIS — R109 Unspecified abdominal pain: Secondary | ICD-10-CM | POA: Diagnosis present

## 2015-09-15 DIAGNOSIS — R1900 Intra-abdominal and pelvic swelling, mass and lump, unspecified site: Secondary | ICD-10-CM

## 2015-09-15 LAB — URINALYSIS, ROUTINE W REFLEX MICROSCOPIC
GLUCOSE, UA: NEGATIVE mg/dL
HGB URINE DIPSTICK: NEGATIVE
Ketones, ur: 15 mg/dL — AB
Nitrite: NEGATIVE
PH: 5 (ref 5.0–8.0)
PROTEIN: 30 mg/dL — AB
Specific Gravity, Urine: >1.03 — ABNORMAL HIGH (ref 1.005–1.030)

## 2015-09-15 LAB — URINE MICROSCOPIC-ADD ON: RBC / HPF: NONE SEEN RBC/hpf (ref 0–5)

## 2015-09-15 LAB — COMPREHENSIVE METABOLIC PANEL
ALBUMIN: 4 g/dL (ref 3.5–5.0)
ALK PHOS: 56 U/L (ref 38–126)
ALT: 15 U/L (ref 14–54)
AST: 19 U/L (ref 15–41)
Anion gap: 10 (ref 5–15)
BILIRUBIN TOTAL: 0.3 mg/dL (ref 0.3–1.2)
BUN: 17 mg/dL (ref 6–20)
CALCIUM: 8.7 mg/dL — AB (ref 8.9–10.3)
CO2: 23 mmol/L (ref 22–32)
CREATININE: 0.67 mg/dL (ref 0.44–1.00)
Chloride: 109 mmol/L (ref 101–111)
GFR calc Af Amer: >60 mL/min (ref >60)
GLUCOSE: 99 mg/dL (ref 65–99)
POTASSIUM: 3.1 mmol/L — AB (ref 3.5–5.1)
Sodium: 142 mmol/L (ref 135–145)
Total Protein: 7 g/dL (ref 6.5–8.1)

## 2015-09-15 LAB — CBC WITH DIFFERENTIAL/PLATELET
BASOS ABS: 0 10*3/uL (ref 0.0–0.1)
BASOS PCT: 0 %
EOS ABS: 0 10*3/uL (ref 0.0–0.7)
EOS PCT: 0 %
HEMATOCRIT: 42 % (ref 36.0–46.0)
Hemoglobin: 13.8 g/dL (ref 12.0–15.0)
Lymphocytes Relative: 21 %
Lymphs Abs: 1.9 10*3/uL (ref 0.7–4.0)
MCH: 28.9 pg (ref 26.0–34.0)
MCHC: 32.9 g/dL (ref 30.0–36.0)
MCV: 88.1 fL (ref 78.0–100.0)
MONOS PCT: 7 %
Monocytes Absolute: 0.6 10*3/uL (ref 0.1–1.0)
Neutro Abs: 6.3 10*3/uL (ref 1.7–7.7)
Neutrophils Relative %: 72 %
PLATELETS: 382 10*3/uL (ref 150–400)
RBC: 4.77 MIL/uL (ref 3.87–5.11)
RDW: 15.1 % (ref 11.5–15.5)
WBC: 8.9 10*3/uL (ref 4.0–10.5)

## 2015-09-15 LAB — LIPASE, BLOOD: LIPASE: 18 U/L (ref 11–51)

## 2015-09-15 LAB — PREGNANCY, URINE: Preg Test, Ur: NEGATIVE

## 2015-09-15 MED ORDER — SODIUM CHLORIDE 0.9 % IV BOLUS (SEPSIS)
1000.0000 mL | Freq: Once | INTRAVENOUS | Status: AC
  Administered 2015-09-15: 1000 mL via INTRAVENOUS
  Filled 2015-09-15: qty 1000

## 2015-09-15 MED ORDER — DEXTROSE 5 % IV SOLN
1.0000 g | Freq: Once | INTRAVENOUS | Status: AC
  Administered 2015-09-15: 1 g via INTRAVENOUS
  Filled 2015-09-15: qty 10

## 2015-09-15 MED ORDER — PROMETHAZINE HCL 25 MG PO TABS: 25.0000 mg | ORAL_TABLET | Freq: Four times a day (QID) | ORAL | Status: DC | PRN

## 2015-09-15 MED ORDER — POTASSIUM CHLORIDE CRYS ER 20 MEQ PO TBCR
40.0000 meq | EXTENDED_RELEASE_TABLET | Freq: Once | ORAL | Status: AC
  Administered 2015-09-15: 40 meq via ORAL
  Filled 2015-09-15: qty 2

## 2015-09-15 MED ORDER — CEPHALEXIN 500 MG PO CAPS: 500.0000 mg | ORAL_CAPSULE | Freq: Four times a day (QID) | ORAL | Status: DC

## 2015-09-15 MED ORDER — SODIUM CHLORIDE 0.9 % IV SOLN
INTRAVENOUS | Status: DC
  Administered 2015-09-15 (×2): via INTRAVENOUS

## 2015-09-15 MED ORDER — ONDANSETRON HCL 4 MG/2ML IJ SOLN
4.0000 mg | Freq: Once | INTRAMUSCULAR | Status: AC
  Administered 2015-09-15: 4 mg via INTRAVENOUS
  Filled 2015-09-15: qty 2

## 2015-09-15 MED ORDER — HYDROCODONE-ACETAMINOPHEN 5-325 MG PO TABS: 1.0000 | ORAL_TABLET | Freq: Four times a day (QID) | ORAL | Status: DC | PRN

## 2015-09-15 MED ORDER — IOHEXOL 300 MG/ML  SOLN
100.0000 mL | Freq: Once | INTRAMUSCULAR | Status: AC | PRN
  Administered 2015-09-15: 100 mL via INTRAVENOUS

## 2015-09-15 MED ORDER — FENTANYL CITRATE (PF) 100 MCG/2ML IJ SOLN: 50.0000 ug | Freq: Once | INTRAMUSCULAR | Status: DC

## 2015-09-15 MED ORDER — IOHEXOL 300 MG/ML  SOLN
25.0000 mL | Freq: Once | INTRAMUSCULAR | Status: AC | PRN
Start: 2015-09-15 — End: 2015-09-15
  Administered 2015-09-15: 25 mL via ORAL

## 2015-09-15 MED ORDER — SODIUM CHLORIDE 0.9 % IV BOLUS (SEPSIS)
1000.0000 mL | Freq: Once | INTRAVENOUS | Status: AC
Start: 2015-09-15 — End: 2015-09-15
  Administered 2015-09-15: 1000 mL via INTRAVENOUS

## 2015-09-15 MED ORDER — HYDROMORPHONE HCL 1 MG/ML IJ SOLN
1.0000 mg | Freq: Once | INTRAMUSCULAR | Status: AC
  Administered 2015-09-15: 1 mg via INTRAVENOUS
  Filled 2015-09-15: qty 1

## 2015-09-15 NOTE — Discharge Instructions (Signed)
Take the antibiotic as directed for the urinary tract infection. Make an appointment on Monday to follow-up with [facility] for the pelvic mass. Very important to check this followed up. Take the pain medicine as needed. Take Phenergan as needed for nausea and vomiting.

## 2015-09-15 NOTE — ED Notes (Signed)
Playing on cell phone- facial features relaxed, family at bedside

## 2015-09-15 NOTE — ED Provider Notes (Signed)
CSN: [id number]     Arrival date & time 09/15/15  1446 History   First MD Initiated Contact with Patient 09/15/15 1521     Chief Complaint  Patient presents with  . Flank Pain     (Consider location/radiation/quality/duration/timing/severity/associated sxs/prior Treatment) Patient is a 40 y.o. [name] presenting with flank pain. The history is provided by the patient and the spouse.  Flank Pain Associated symptoms include abdominal pain. Pertinent negatives include no chest pain, no headaches and no shortness of breath.   patient with onset yesterday afternoon at 2:00 bilateral back pain, bilateral flank pain and bilateral lower quadrant pain all occurring simultaneously. Patient's had some difficulty urinating and discomfort with urinating. Associated with nausea and vomiting but no fevers. No diarrhea. No vaginal discharge.  Past Medical History  Diagnosis Date  . COPD (chronic obstructive pulmonary disease) (HCC)   . Renal disorder     Kidney stones  . Hypertension   . IBS (irritable bowel syndrome)    Past Surgical History  Procedure Laterality Date  . Dilation and curettage of uterus     No family history on file. Social History  Substance Use Topics  . Smoking status: Current Every Day Smoker -- 2.00 packs/day  . Smokeless tobacco: None  . Alcohol Use: Yes     Comment: occasionally   OB History    No data available     Review of Systems  Constitutional: Negative for fever.  HENT: Negative for congestion.   Eyes: Negative for visual disturbance.  Respiratory: Negative for shortness of breath.   Cardiovascular: Negative for chest pain.  Gastrointestinal: Positive for abdominal pain.  Genitourinary: Positive for dysuria, flank pain, difficulty urinating and pelvic pain.  Musculoskeletal: Positive for back pain.  Skin: Negative for rash.  Neurological: Negative for headaches.  Hematological: Does not bruise/bleed easily.  Psychiatric/Behavioral: Negative for  confusion.      Allergies  Review of patient's allergies indicates no known allergies.  Home Medications   Prior to Admission medications   Medication Sig Start Date End Date Taking? Authorizing Provider  albuterol (PROVENTIL HFA;VENTOLIN HFA) 108 (90 BASE) MCG/ACT inhaler Inhale 2 puffs into the lungs every 6 (six) hours as needed for wheezing.   Yes Historical Provider, MD  albuterol (PROVENTIL) (2.5 MG/3ML) 0.083% nebulizer solution Take 2.5 mg by nebulization every 6 (six) hours as needed for wheezing.   Yes Historical Provider, MD  OXYGEN Inhale 2 L into the lungs daily.   Yes Historical Provider, MD  cephALEXin (KEFLEX) 500 MG capsule Take 1 capsule (500 mg total) by mouth 4 (four) times daily. 09/15/15   Vanetta Mulders, MD  HYDROcodone-acetaminophen (NORCO/VICODIN) 5-325 MG tablet Take 1-2 tablets by mouth every 6 (six) hours as needed. 09/15/15   Vanetta Mulders, MD  promethazine (PHENERGAN) 25 MG tablet Take 1 tablet (25 mg total) by mouth every 6 (six) hours as needed. 09/15/15   Vanetta Mulders, MD   BP 137/80 mmHg  Pulse 75  Temp(Src) 98.4 F (36.9 C) (Oral)  Resp 18  Ht  (1.575 m)  Wt 95.255 kg  BMI 38.40 kg/m2  SpO2 97%  LMP 09/03/2015 Physical Exam  Constitutional: She is oriented to person, place, and time. She appears well-developed and well-nourished. No distress.  HENT:  Head: Normocephalic and atraumatic.  Mouth/Throat: Oropharynx is clear and moist.  Eyes: Conjunctivae and EOM are normal. Pupils are equal, round, and reactive to light.  Neck: Normal range of motion. Neck supple.  Cardiovascular: Normal rate, regular  rhythm and normal heart sounds.   No murmur heard. Pulmonary/Chest: Effort normal and breath sounds normal. No respiratory distress.  Abdominal: Soft. Bowel sounds are normal. There is tenderness.  Lower quadrant tenderness without guarding.  Musculoskeletal: Normal range of motion.  Neurological: She is alert and oriented to person, place,  and time. No cranial nerve deficit. She exhibits normal muscle tone. Coordination normal.  Skin: Skin is warm.  Nursing note and vitals reviewed.   ED Course  Procedures (including critical care time) Labs Review Labs Reviewed  URINALYSIS, ROUTINE W REFLEX MICROSCOPIC (NOT AT [facility]) - Abnormal; Notable for the following:    Color, Urine AMBER (*)    APPearance HAZY (*)    Specific Gravity, Urine >1.030 (*)    Bilirubin Urine MODERATE (*)    Ketones, ur 15 (*)    Protein, ur 30 (*)    Leukocytes, UA TRACE (*)    All other components within normal limits  COMPREHENSIVE METABOLIC PANEL - Abnormal; Notable for the following:    Potassium 3.1 (*)    Calcium 8.7 (*)    All other components within normal limits  URINE MICROSCOPIC-ADD ON - Abnormal; Notable for the following:    Squamous Epithelial / LPF 6-30 (*)    Bacteria, UA FEW (*)    All other components within normal limits  URINE CULTURE  PREGNANCY, URINE  LIPASE, BLOOD  CBC WITH DIFFERENTIAL/PLATELET    Imaging Review Ct Abdomen Pelvis W Contrast  09/15/2015  CLINICAL DATA:  Left-sided flank pain since yesterday. Nephrolithiasis. EXAM: CT ABDOMEN AND PELVIS WITH CONTRAST TECHNIQUE: Multidetector CT imaging of the abdomen and pelvis was performed using the standard protocol following bolus administration of intravenous contrast. CONTRAST:  100mL OMNIPAQUE IOHEXOL 300 MG/ML  SOLN COMPARISON:  07/04/2014 FINDINGS: Lower chest:  No acute findings. Hepatobiliary: No masses or other significant abnormality. Gallbladder is unremarkable. Pancreas: No mass, inflammatory changes, or other significant abnormality. Spleen: Within normal limits in size and appearance. Adrenals/Urinary Tract: No masses identified. 5 mm calculus again seen in lower pole of right kidney. No evidence of hydronephrosis. No evidence of ureteral calculi or dilatation. Stomach/Bowel: No evidence of obstruction, inflammatory process, or abnormal fluid collections. Normal  appendix visualized. Vascular/Lymphatic: No pathologically enlarged lymph nodes. No evidence of abdominal aortic aneurysm. Reproductive: Uterus is normal in appearance. A complex cystic mass containing several internal septations and mild wall thickening is seen in the central pelvis, superior to the uterus. This measures 10.6 x 11.0 cm, and differential diagnosis includes cystic ovarian neoplasm, and endometrioma. No evidence of ascites. Other: None. Musculoskeletal:  No suspicious bone lesions identified. IMPRESSION: 11 cm complex cystic mass in the central pelvis which is increased in size since previous study, suspicious for cystic ovarian neoplasm or possibly endometrioma. Given size of this lesion, consider surgical evaluation versus nonemergent pelvic MRI without and with contrast for further evaluation. This recommendation follows the consensus statement: Management of Asymptomatic Ovarian and Other Adnexal Cysts Imaged at US: Society of Radiologists in Ultrasound Consensus Conference Statement. Radiology 2010; [phone] [id number]. 5 mm nonobstructive right renal calculus. No evidence of ureteral calculi or hydronephrosis Electronically Signed   By: Myles RosenthalJohn  Stahl M.D.   On: 09/15/2015 18:49   I have personally reviewed and evaluated these images and lab results as part of my medical decision-making.   EKG Interpretation None      MDM   Final diagnoses:  Pelvic mass  UTI (lower urinary tract infection)   Patient with enlarging pelvic mass that  was identified in December 2015. Patient apparently has not had follow-up. Is now larger in size. Concerning for ovarian neoplasm. [facility] referred to [facility] for further evaluation. Urinalysis with questionable urinary tract infection will treat urine culture sent.    Vanetta Mulders, MD 09/15/15 [phone]

## 2015-09-15 NOTE — ED Notes (Signed)
Pt daughter to desk to request pain meds for her mother

## 2015-09-15 NOTE — ED Notes (Signed)
Pt reports chronic constipation with normal bowel movement "a year ago" per pt. She reports bladder pain since last pm with scant amounts of urine and feeling that she still needs to void.

## 2015-09-15 NOTE — ED Notes (Signed)
Pt complains of amount of fluids she is to receive - request prescriptions that her spouse might go to pharmacy to retrieve- MD apprised

## 2015-09-15 NOTE — ED Notes (Signed)
Pt reports LT sided flank pain, n/v, and urinary retention since last night. Pt has hx of kidney stones.

## 2015-09-15 NOTE — ED Notes (Signed)
Pt and family have had [employer] meal

## 2015-09-18 LAB — URINE CULTURE: Culture: 70000

## 2015-09-19 ENCOUNTER — Telehealth (HOSPITAL_BASED_OUTPATIENT_CLINIC_OR_DEPARTMENT_OTHER): Payer: Self-pay | Admitting: Emergency Medicine

## 2015-09-19 NOTE — Telephone Encounter (Signed)
Post ED Visit - Positive Culture Follow-up: Successful Patient Follow-Up  Culture assessed and recommendations reviewed by: []  Enzo BiNathan Batchelder, Pharm.D. []  Celedonio MiyamotoJeremy Frens, Pharm.D., BCPS []  Garvin FilaMike Maccia, Pharm.D. []  Georgina PillionElizabeth Martin, Pharm.D., BCPS []  Martins FerryMinh Pham, [place]Pharm.D., BCPS, AAHIVP []  Estella HuskMichelle Turner, Pharm.D., BCPS, AAHIVP []  Tennis Mustassie Stewart, Pharm.D. []  Rob Oswaldo DoneVincent, [address] Rainbow BoulevardPharm[address]Baron Hamper. Meagan Decker PA  Positive urine Enterococcus  [x]  Patient discharged without antimicrobial prescription and treatment is now indicated []  Organism is resistant to prescribed ED discharge antimicrobial []  Patient with positive blood cultures  Changes discussed with ED provider: Catha GosselinHanna Patel-Mills PA New antibiotic prescription stop keflex, start amoxicillin 500mg  po bid x 7 days  Attempting to contact pt    Berle MullMiller, Kiaan Overholser 09/19/2015, 11:28 AM

## 2015-09-19 NOTE — Progress Notes (Signed)
ED Antimicrobial Stewardship Positive Culture Follow Up   Norma [name] is an 40 y.o. female [facility] with a chief complaint of left flank pain, nausea/vomiting, and urinary retention.   Recent Results (from the past 720 hour(s))  Urine culture     Status: None   Collection Time: 09/15/15  3:35 PM  Result Value Ref Range Status   Specimen Description URINE, RANDOM  Final   Special Requests NONE  Final   Culture   Final    70,000 COLONIES/ml ENTEROCOCCUS SPECIES [facility]    Report Status 09/18/2015 FINAL  Final   Organism ID, Bacteria ENTEROCOCCUS SPECIES  Final      Susceptibility   Enterococcus species - MIC*    AMPICILLIN <=2 SENSITIVE Sensitive     LEVOFLOXACIN 1 SENSITIVE Sensitive     NITROFURANTOIN <=16 SENSITIVE Sensitive     VANCOMYCIN 2 SENSITIVE Sensitive     * 70,000 COLONIES/ml ENTEROCOCCUS SPECIES     [x]  Treated with cephalexin, organism resistant to prescribed antimicrobial.   New antibiotic prescription: Stop cephalexin. Start amoxicillin 500 mg BID x 7 days   ED Provider: Hebert SohoHanna Patel, PA-C   Arcola JanskyMeagan Yilia Sacca, PharmD Clinical Pharmacy Resident Phone# [phone] 09/19/2015 9:24 AM

## 2015-09-20 ENCOUNTER — Ambulatory Visit (INDEPENDENT_AMBULATORY_CARE_PROVIDER_SITE_OTHER): Payer: Medicaid Other | Admitting: Advanced Practice Midwife

## 2015-09-20 ENCOUNTER — Encounter: Payer: Self-pay | Admitting: Advanced Practice Midwife

## 2015-09-20 VITALS — BP 120/76 | HR 88 | Ht 62.0 in | Wt 194.0 lb

## 2015-09-20 DIAGNOSIS — R1900 Intra-abdominal and pelvic swelling, mass and lump, unspecified site: Secondary | ICD-10-CM | POA: Diagnosis not present

## 2015-09-20 MED ORDER — HYDROCODONE-ACETAMINOPHEN 5-325 MG PO TABS: 1.0000 | ORAL_TABLET | Freq: Four times a day (QID) | ORAL | Status: DC | PRN

## 2015-09-20 NOTE — Progress Notes (Signed)
[facility] Visit  Patient name: Norma [name] MRN [id number]  Date of birth: [birth year]  CC & HPI:  Norma [name] is a 40 y.o. Caucasian female presenting today for F/U today from ED.  In  June 2015 she had a pelvic US in the ED which showed Left ovarian mass:  Not discretely visualized. 7.9 x 5.0 x 8.5 cm complex, septated left adnexal mass with homogeneous low-level internal echoes. No thickened septation. No mural nodularity. No color Doppler flow. This appearance is indeterminate but likely benign, favoring a endometrioma, although a benign ovarian neoplasm remains possible.   December 2015, she had another [place] through the ED:    Measurements: Not discretely visualized as on prior ultrasound. Again demonstrated is a large hypovascular mass within the left adnexa, measuring approximately 7.0 x 4.2 x 8.8 cm. Overall size and appearance are similar to the prior studies obtained 6 months ago.    She did not seek F/U.  Last week, she had bilateral flank pain, and had a CT.    Reproductive: Uterus is normal in appearance. A complex cystic mass containing several internal septations and mild wall thickening is seen in the central pelvis, superior to the uterus. This measures 10.6 x 11.0 cm, and differential diagnosis includes cystic ovarian neoplasm, and endometrioma. No evidence of ascites  She had a culture + UTI, but the CT noted that her L ovarian mass has increased in size.:   Pt "wants everything out".  She states that she has "been putting up with the pain", but does request a refill on vicodin.  Discussed need to be careful with narcotics for long term problems, and to take sparingly, esp with probable surgical pain coming up.    Pertinent History Reviewed:  Medical & Surgical Hx:   Past Medical History  Diagnosis Date  . COPD (chronic obstructive pulmonary disease) (HCC)   . Renal disorder     Kidney stones  . Hypertension   . IBS (irritable bowel syndrome)     Past Surgical History  Procedure Laterality Date  . Dilation and curettage of uterus     Family History  Problem Relation Age of Onset  . COPD Mother   . Diabetes Father   . Hypertension Father   . Heart disease Sister   . Heart disease Maternal Aunt     Current outpatient prescriptions:  .  albuterol (PROVENTIL HFA;VENTOLIN HFA) 108 (90 BASE) MCG/ACT inhaler, Inhale 2 puffs into the lungs every 6 (six) hours as needed for wheezing., Disp: , Rfl:  .  albuterol (PROVENTIL) (2.5 MG/3ML) 0.083% nebulizer solution, Take 2.5 mg by nebulization every 6 (six) hours as needed for wheezing., Disp: , Rfl:  .  cephALEXin (KEFLEX) 500 MG capsule, Take 1 capsule (500 mg total) by mouth 4 (four) times daily., Disp: 28 capsule, Rfl: 0 .  OXYGEN, Inhale 2 L into the lungs daily., Disp: , Rfl:  .  promethazine (PHENERGAN) 25 MG tablet, Take 1 tablet (25 mg total) by mouth every 6 (six) hours as needed., Disp: 12 tablet, Rfl: 1 .  HYDROcodone-acetaminophen (NORCO/VICODIN) 5-325 MG tablet, Take 1 tablet by mouth every 6 (six) hours as needed for moderate pain., Disp: 20 tablet, Rfl: 0 Social History: Reviewed -  reports that she has been smoking.  She has never used smokeless tobacco.  Review of Systems:    Constitutional: Negative for fever and chills Eyes: Negative for visual disturbances Respiratory: Negative for shortness of breath, dyspnea Cardiovascular:  Negative for chest pain or palpitations  Gastrointestinal: Negative for vomiting, diarrhea and constipation;  Genitourinary: Negative for dysuria and urgency, vaginal irritation or itching Musculoskeletal: Negative for back pain, joint pain, myalgias  Neurological: Negative for dizziness and headaches    Objective Findings:  Vitals: BP 120/76 mmHg  Pulse 88  Ht 5\' 2"  (1.575 m)  Wt 194 lb (87.998 kg)  BMI 35.47 kg/m2  LMP 09/03/2015  Physical Examination: General appearance - well appearing, and in no distress Mental status -  alert, oriented to person, place, and time Chest:  Normal respiratory effort Heart - normal rate and regular rhythm Musculoskeletal:  Normal range of motion without pain Extremities:  No edema  No results found for this or any previous visit (from the past 24 hour(s)).        Assessment & Plan:  A:   Growing pelvic mass P: Orders Placed This Encounter  Procedures  . US Pelvis Complete    Standing Status: Future     Number of Occurrences:      Standing Expiration Date: 11/19/2016    Order Specific Question:  Reason for exam:    Answer:  pelvic mass on CT    Order Specific Question:  Preferred imaging location?    Answer:  Internal  . CA 125      Meds ordered this encounter  Medications  . DISCONTD: HYDROcodone-acetaminophen (NORCO/VICODIN) 5-325 MG tablet    Sig: Take 1 tablet by mouth every 6 (six) hours as needed for moderate pain.    Dispense:  20 tablet    Refill:  0    Order Specific Question:  Supervising Provider    Answer:  Despina HiddenEURE, LUTHER H [[id number]]  . HYDROcodone-acetaminophen (NORCO/VICODIN) 5-325 MG tablet    Sig: Take 1 tablet by mouth every 6 (six) hours as needed for moderate pain.    Dispense:  20 tablet    Refill:  0    Order Specific Question:  Supervising Provider    Answer:  Despina HiddenEURE, LUTHER H [[id number]]   50% or more of this visit was spent in counseling and coordination of care.  20 minutes of face to face time.     Return in about 1 week (around 09/27/2015), or us/MD visit.   CRESENZO-DISHMAN,Travone Georg CNM 09/20/2015 2:38 PM

## 2015-09-21 LAB — CA 125: CA 125: 120.7 U/mL — AB (ref 0.0–38.1)

## 2015-09-28 ENCOUNTER — Ambulatory Visit (INDEPENDENT_AMBULATORY_CARE_PROVIDER_SITE_OTHER): Payer: Medicaid Other

## 2015-09-28 ENCOUNTER — Ambulatory Visit (INDEPENDENT_AMBULATORY_CARE_PROVIDER_SITE_OTHER): Payer: Medicaid Other | Admitting: Obstetrics and Gynecology

## 2015-09-28 ENCOUNTER — Encounter: Payer: Self-pay | Admitting: Obstetrics and Gynecology

## 2015-09-28 ENCOUNTER — Other Ambulatory Visit: Payer: Self-pay | Admitting: Advanced Practice Midwife

## 2015-09-28 VITALS — Ht 62.0 in

## 2015-09-28 DIAGNOSIS — N83201 Unspecified ovarian cyst, right side: Secondary | ICD-10-CM

## 2015-09-28 DIAGNOSIS — R102 Pelvic and perineal pain: Secondary | ICD-10-CM

## 2015-09-28 DIAGNOSIS — R1900 Intra-abdominal and pelvic swelling, mass and lump, unspecified site: Secondary | ICD-10-CM

## 2015-09-28 MED ORDER — PROMETHAZINE HCL 25 MG PO TABS: 25.0000 mg | ORAL_TABLET | Freq: Four times a day (QID) | ORAL | Status: DC | PRN

## 2015-09-28 MED ORDER — HYDROCODONE-ACETAMINOPHEN 5-325 MG PO TABS: 1.0000 | ORAL_TABLET | Freq: Four times a day (QID) | ORAL | Status: DC | PRN

## 2015-09-28 NOTE — Progress Notes (Addendum)
Patient ID: MARESSA APOLLO, female   DOB: Dec 02, 1975, 40 y.o.   MRN: [id number]   [facility] Visit  Patient name: Norma [name] MRN [id number]  Date of birth: 10/28/1975  CC & HPI:  Norma [name] is a 40 y.o. female presenting today for f/u of transvaginal/pelvic US results today. Pt states she has been having LLQ and back pain for 2 years and had CT abd/pelv in 2015 that showed 11 cm complex cystic mass. She also had pelvic US in 2015 showed large hypovascular mass within the left adnexa, measuring approximately 7.0 x 4.2 x 8.8 cm. Last pap in 2010. No h/o abnormal pap smear. Pt is a current daily smoker, 1.5 ppd. She denies alcohol use, drug use. Pt has an IUD and has not been sexually active in 2 years.  Currently, she reports intermittent, daily LLQ pains, lasting 3-4 hours with associated nausea. She states her pains have worsened in severity over the last few months. She denies emesis. Pt states significant relief with hydrocodone use, rx by this practice. She also reports relief with phenergan taken in the past, rx by an ED physician. She also reports an inadvertent 12 lb weight loss in 2 years.    ROS:  A complete 10 system review of systems was obtained and all systems are negative except as noted in the HPI and PMH.    Pertinent History Reviewed:   Reviewed: Significant for COPD, renal disorder, HTN, D&[facility] Medical         Past Medical History  Diagnosis Date   COPD (chronic obstructive pulmonary disease) (HCC)    Renal disorder     Kidney stones   Hypertension    IBS (irritable bowel syndrome)                               Surgical Hx:    Past Surgical History  Procedure Laterality Date   Dilation and curettage of uterus     Medications: Reviewed & Updated - see associated section                       Current outpatient prescriptions:    albuterol (PROVENTIL HFA;VENTOLIN HFA) 108 (90 BASE) MCG/ACT inhaler, Inhale 2 puffs into the lungs every 6  (six) hours as needed for wheezing., Disp: , Rfl:    albuterol (PROVENTIL) (2.5 MG/3ML) 0.083% nebulizer solution, Take 2.5 mg by nebulization every 6 (six) hours as needed for wheezing., Disp: , Rfl:    cephALEXin (KEFLEX) 500 MG capsule, Take 1 capsule (500 mg total) by mouth 4 (four) times daily., Disp: 28 capsule, Rfl: 0   OXYGEN, Inhale 2 L into the lungs daily., Disp: , Rfl:    promethazine (PHENERGAN) 25 MG tablet, Take 1 tablet (25 mg total) by mouth every 6 (six) hours as needed., Disp: 12 tablet, Rfl: 1   HYDROcodone-acetaminophen (NORCO/VICODIN) 5-325 MG tablet, Take 1 tablet by mouth every 6 (six) hours as needed for moderate pain. (Patient not taking: Reported on 09/28/2015), Disp: 20 tablet, Rfl: 0   Social History: Reviewed -  reports that she has been smoking Cigarettes.  She has a 28.5 pack-year smoking history. She has never used smokeless tobacco.  Objective Findings:  Vitals: Height  (1.575 m), last menstrual period 09/24/2015.  Physical Examination: discussion only   Discussed with pt [place] results, surgical options including left oophorectomy vs hysterectomy with left  salping oophorectomy. Pt states she would rather proceed with a hysterectomy. Discussed hormone therapy in the case of hysterectomy with BSO. At end of discussion, pt had opportunity to ask questions and has no further questions at this time. Pt is agreeable to leaving the right ovary in place pending normal lab work and testing.   Greater than 50% was spent in counseling and coordination of care with the patient. Total time greater than: 45 minutes    Assessment & Plan:   A: 1 large hypovascular mass within the left adnexa with elevated Ca125 = 120.7 2. Marland Kitchen. LLQ pain for 2 years  3. Pelvic US today showed 12.3 x 8 x 11.6 cm complex cyst w/ septations, bilateral arterial and venous flow seen, no free fluid 4. US today showing slow enlargement of mass from 2 years ago, measuring approximately 7.0 x 4.2  x 8.8 cm  P:  1. consult Dr Andrey Farmerossi 2. F/u in 5 days for pap smear, endometrial biopsy  3. Rx hydrocodone, phenergan      By signing my name below, I, Doreatha MartinEva Mathews, attest that this documentation has been prepared under the direction and in the presence of Tilda BurrowJohn Ferguson V, MD. Electronically Signed: Doreatha MartinEva Mathews, ED Scribe. 09/28/2015. 10:28 AM.  I personally performed the services described in this documentation, which was SCRIBED in my presence. The recorded information has been reviewed and considered accurate. It has been edited as necessary during review. [name],JOHN V, MD

## 2015-09-28 NOTE — Progress Notes (Signed)
PELVIC US TA/TV: normal homogenous uterus,complex rt ov cyst 3.2 x 3.3 x 3.1cm,left adnexa has a 12.3 x 8 x 11.6 cm complex cyst w/septations,bilat arterial and venous flow seen,no free fluid,bilat pelvic pain during ultrasound

## 2015-09-28 NOTE — Progress Notes (Signed)
Patient ID: Norma ClassJennifer L Rinck, female   DOB: 04/23/1976, 40 y.o.   MRN: [id number] Pt here today for pre op exam. Pt states that she would like to have a complete hysterectomy. Pt states that she has an ovarian cyst. Pt had an US today at our office.

## 2015-09-29 NOTE — Progress Notes (Signed)
Patient ID: Josefine ClassJennifer L Bailly, female   DOB: 10/27/1975, 40 y.o.   MRN: [id number] Please see the note below by Selinda OrionEva Matthews, scribe, edited by me.

## 2015-10-01 ENCOUNTER — Telehealth (HOSPITAL_BASED_OUTPATIENT_CLINIC_OR_DEPARTMENT_OTHER): Payer: Self-pay | Admitting: Emergency Medicine

## 2015-10-01 NOTE — Telephone Encounter (Signed)
Letter returned, no forwarding address, lost to followup 

## 2015-10-02 ENCOUNTER — Ambulatory Visit (INDEPENDENT_AMBULATORY_CARE_PROVIDER_SITE_OTHER): Payer: Medicaid Other | Admitting: Obstetrics and Gynecology

## 2015-10-02 ENCOUNTER — Encounter: Payer: Self-pay | Admitting: Obstetrics and Gynecology

## 2015-10-02 ENCOUNTER — Telehealth: Payer: Self-pay | Admitting: Obstetrics & Gynecology

## 2015-10-02 ENCOUNTER — Other Ambulatory Visit (HOSPITAL_COMMUNITY)
Admission: RE | Admit: 2015-10-02 | Discharge: 2015-10-02 | Disposition: A | Discharge status: Home / Self Care | Payer: Medicaid Other | Source: Ambulatory Visit | Attending: Obstetrics and Gynecology | Admitting: Obstetrics and Gynecology

## 2015-10-02 VITALS — BP 122/80 | Ht 62.0 in | Wt 192.0 lb

## 2015-10-02 DIAGNOSIS — Z1151 Encounter for screening for human papillomavirus (HPV): Secondary | ICD-10-CM | POA: Insufficient documentation

## 2015-10-02 DIAGNOSIS — Z01818 Encounter for other preprocedural examination: Secondary | ICD-10-CM | POA: Diagnosis not present

## 2015-10-02 DIAGNOSIS — K602 Anal fissure, unspecified: Secondary | ICD-10-CM

## 2015-10-02 DIAGNOSIS — Z01419 Encounter for gynecological examination (general) (routine) without abnormal findings: Secondary | ICD-10-CM | POA: Diagnosis present

## 2015-10-02 DIAGNOSIS — K629 Disease of anus and rectum, unspecified: Secondary | ICD-10-CM | POA: Diagnosis not present

## 2015-10-02 DIAGNOSIS — R1900 Intra-abdominal and pelvic swelling, mass and lump, unspecified site: Secondary | ICD-10-CM | POA: Diagnosis not present

## 2015-10-02 DIAGNOSIS — Z113 Encounter for screening for infections with a predominantly sexual mode of transmission: Secondary | ICD-10-CM | POA: Insufficient documentation

## 2015-10-02 NOTE — Telephone Encounter (Signed)
Pt states went to restroom this am and had blood in the toilet, not coming from the vaginal area. Pt given an appt with Dr. Emelda FearFerguson for evaluation today.

## 2015-10-02 NOTE — Progress Notes (Signed)
Patient ID: Norma ClassJennifer L Wuest, female   DOB: 06/07/1976, 40 y.o.   MRN: [id number] Pt worked in today for rectal bleeding.

## 2015-10-02 NOTE — Telephone Encounter (Signed)
Pt called stating that she would like a call back from the nurse. Pt did not state the reason why. Please contact pt °

## 2015-10-03 ENCOUNTER — Other Ambulatory Visit: Payer: Medicaid Other | Admitting: Obstetrics and Gynecology

## 2015-10-04 DIAGNOSIS — K602 Anal fissure, unspecified: Secondary | ICD-10-CM | POA: Insufficient documentation

## 2015-10-04 HISTORY — DX: Anal fissure, unspecified: K60.2

## 2015-10-04 LAB — CYTOLOGY - PAP

## 2015-10-04 NOTE — Progress Notes (Signed)
Patient ID: Norma [name], female   DOB: 03/18/1976, 40 y.o.   MRN: [id number]   [facility]  Patient name: Norma [name] MRN [id number]  Date of birth: 01/15/1976  CC & HPI:  Norma [name] Jordahl is a 40 y.o. female presenting today for rectal bleeding. Was scheduled for pap tomorrow. As part of preop Pt was being considered for endometrial bx but endometrium on u/s is 4 mm reassuring  ROS:  Ovarian mass discussed with Dr Andrey Farmerossi who feels benign characteristics predominate, so will do surgery here with Frozen [facility] Norma [name] is a 40 y.o. LMP 09/24/2015 for a pelvic sonogram to f/u left adnexal cyst.  Uterus 10.5 x 6.2 x 4 cm, WNL  Endometrium 4.4 mm, symmetrical, WNL  Right ovary 4.2 x 4.3 x 3.4 cm, complex rt ov cyst 3.2 x 3.3 x 3.1cm  Left ovary 12.3 x 8 x 11.6 cm, ,left adnexa has a 12.3 x 8 x 11.6 cm complex cyst w/septations  No free fluid seen,bilat pelvic pain  Technician Comments:  PELVIC US TA/TV: normal homogenous uterus,complex rt ov cyst 3.2 x 3.3 x 3.1cm,left adnexa has a 12.3 x 8 x 11.6 cm complex cyst w/septations,bilat arterial and venous flow seen,no free fluid,bilat pelvic pain during ultrasound,EEC 4.144mm    Amber J Carl 09/28/2015 10:22 AM Pertinent History Reviewed:   Reviewed: Significant for see last visit , pap done Medical         Past Medical History  Diagnosis Date  . COPD (chronic obstructive pulmonary disease) (HCC)   . Renal disorder     Kidney stones  . Hypertension   . IBS (irritable bowel syndrome)                               Surgical Hx:    Past Surgical History  Procedure Laterality Date  . Dilation and curettage of uterus     Medications: Reviewed & Updated - see associated section                       Current outpatient prescriptions:  .  albuterol (PROVENTIL HFA;VENTOLIN HFA) 108 (90 BASE) MCG/ACT inhaler, Inhale 2 puffs into the lungs every  6 (six) hours as needed for wheezing., Disp: , Rfl:  .  albuterol (PROVENTIL) (2.5 MG/3ML) 0.083% nebulizer solution, Take 2.5 mg by nebulization every 6 (six) hours as needed for wheezing., Disp: , Rfl:  .  HYDROcodone-acetaminophen (NORCO/VICODIN) 5-325 MG tablet, Take 1 tablet by mouth every 6 (six) hours as needed for moderate pain., Disp: 30 tablet, Rfl: 0 .  OXYGEN, Inhale 2 [name] into the lungs daily., Disp: , Rfl:  .  promethazine (PHENERGAN) 25 MG tablet, Take 1 tablet (25 mg total) by mouth every 6 (six) hours as needed for nausea or vomiting., Disp: 30 tablet, Rfl: 1   Social History: Reviewed -  reports that she has been smoking Cigarettes.  She has a 28.5 pack-year smoking history. She has never used smokeless tobacco.  Objective Findings:  Vitals: Blood pressure 122/80, height 5\' 2"  (1.575 m), weight 192 lb (87.091 kg), last menstrual period 09/24/2015.  Physical Examination: General appearance - alert, well appearing, and in no distress, oriented to person, place, and time and normal appearing weight Mental status - alert, oriented to person, place, and time, normal mood, behavior, speech, dress, motor activity, and thought processes Eyes - pupils equal and  reactive, extraocular eye movements intact Neck - supple, no significant adenopathy Chest - clear to auscultation, no wheezes, rales or rhonchi, symmetric air entry Heart - normal rate and regular rhythm Abdomen - soft, nontender, nondistended, no masses or organomegaly Pelvic - VULVA: normal appearing vulva with no masses, tenderness or lesions, VAGINA: normal appearing vagina with normal color and discharge, no lesions, PELVIC FLOOR EXAM: no cystocele, rectocele or prolapse noted, CERVIX: normal appearing cervix without discharge or lesions, cervix deviated forward by the pelvic mass in cul de sac, and pushing uterus forward , UTERUS: uterus is normal size, shape, consistency and nontender, anteverted, mobile, pushed markedly  forward , ADNEXA: mass present right, and cul de sac side, size 11 cm rectium : small anal fissure,at 5 oclock at anal verge no masses or problems suspected.  Assessment & Plan:   A:  1. Large pelvic mass with mildly elevated Ca 125, felt benign by gyn Onc.  2  P:  1. Await pap,  2  Preparation H , stool softener for anal fissure. 2 plan for Abdominal hysterectomy with bilateral salpingo oophorectomy in April

## 2015-10-05 ENCOUNTER — Encounter (HOSPITAL_COMMUNITY)
Admission: RE | Admit: 2015-10-05 | Discharge: 2015-10-05 | Disposition: A | Discharge status: Home / Self Care | Payer: Medicaid Other | Source: Ambulatory Visit | Attending: Obstetrics and Gynecology | Admitting: Obstetrics and Gynecology

## 2015-10-05 ENCOUNTER — Encounter (HOSPITAL_COMMUNITY): Payer: Self-pay

## 2015-10-05 ENCOUNTER — Other Ambulatory Visit: Payer: Self-pay | Admitting: Obstetrics and Gynecology

## 2015-10-05 DIAGNOSIS — Z01812 Encounter for preprocedural laboratory examination: Secondary | ICD-10-CM | POA: Insufficient documentation

## 2015-10-05 HISTORY — DX: Personal history of urinary calculi: Z87.442

## 2015-10-05 HISTORY — DX: Anxiety disorder, unspecified: F41.9

## 2015-10-05 LAB — COMPREHENSIVE METABOLIC PANEL
ALK PHOS: 65 U/L (ref 38–126)
ALT: 10 U/L — ABNORMAL LOW (ref 14–54)
ANION GAP: 7 (ref 5–15)
AST: 12 U/L — ABNORMAL LOW (ref 15–41)
Albumin: 3.2 g/dL — ABNORMAL LOW (ref 3.5–5.0)
BILIRUBIN TOTAL: 0.4 mg/dL (ref 0.3–1.2)
BUN: 7 mg/dL (ref 6–20)
CALCIUM: 8.5 mg/dL — AB (ref 8.9–10.3)
CO2: 24 mmol/L (ref 22–32)
Chloride: 106 mmol/L (ref 101–111)
Creatinine, Ser: 0.55 mg/dL (ref 0.44–1.00)
GFR calc Af Amer: >60 mL/min (ref >60)
GFR calc non Af Amer: >60 mL/min (ref >60)
Glucose, Bld: 89 mg/dL (ref 65–99)
POTASSIUM: 4.1 mmol/L (ref 3.5–5.1)
SODIUM: 137 mmol/L (ref 135–145)
TOTAL PROTEIN: 6 g/dL — AB (ref 6.5–8.1)

## 2015-10-05 LAB — CBC
HEMATOCRIT: 35.8 % — AB (ref 36.0–46.0)
HEMOGLOBIN: 11.7 g/dL — AB (ref 12.0–15.0)
MCH: 29.1 pg (ref 26.0–34.0)
MCHC: 32.7 g/dL (ref 30.0–36.0)
MCV: 89.1 fL (ref 78.0–100.0)
Platelets: 365 10*3/uL (ref 150–400)
RBC: 4.02 MIL/uL (ref 3.87–5.11)
RDW: 15.5 % (ref 11.5–15.5)
WBC: 6.6 10*3/uL (ref 4.0–10.5)

## 2015-10-05 LAB — HCG, SERUM, QUALITATIVE: Preg, Serum: NEGATIVE

## 2015-10-05 LAB — URINALYSIS, ROUTINE W REFLEX MICROSCOPIC
BILIRUBIN URINE: NEGATIVE
GLUCOSE, UA: NEGATIVE mg/dL
KETONES UR: NEGATIVE mg/dL
Nitrite: NEGATIVE
PH: 6.5 (ref 5.0–8.0)
Protein, ur: NEGATIVE mg/dL
Specific Gravity, Urine: 1.015 (ref 1.005–1.030)

## 2015-10-05 LAB — URINE MICROSCOPIC-ADD ON

## 2015-10-05 NOTE — Patient Instructions (Signed)
LAURANNE BEYERSDORF  10/05/2015     @   Your procedure is scheduled on  10/10/15   Report to [facility] at  615  A.M.  Call this number if you have problems the morning of surgery:  [phone]   Remember:  Do not eat food or drink liquids after midnight.  Take these medicines the morning of surgery with A SIP OF WATER  Hydrocodone, phenergan.   Do not wear jewelry, make-up or nail polish.  Do not wear lotions, powders, or perfumes.  You may wear deodorant.  Do not shave 48 hours prior to surgery.  Men may shave face and neck.  Do not bring valuables to the hospital.  [facility] is not responsible for any belongings or valuables.  Contacts, dentures or bridgework may not be worn into surgery.  Leave your suitcase in the car.  After surgery it may be brought to your room.  For patients admitted to the hospital, discharge time will be determined by your treatment team.  Patients discharged the day of surgery will not be allowed to drive home.   Name and phone number of your driver:   family Special instructions:  Follow the diet and prep instructions given to you by Dr Rayna Sexton office.  Please read over the following fact sheets that you were given. Coughing and Deep Breathing, Blood Transfusion Information, Surgical Site Infection Prevention, Anesthesia Post-op Instructions and Care and Recovery After Surgery      Salpingectomy Salpingectomy, also called tubectomy, is the surgical removal of one of the fallopian tubes. The fallopian tubes are tubes that are connected to the uterus. These tubes transport the egg from the ovary to the uterus. A salpingectomy may be done for various reasons, including:   A tubal (ectopic) pregnancy. This is especially true if the tube ruptures.  An infected fallopian tube.  The need to remove the fallopian tube when removing an ovary with a cyst or tumor.  The need to remove the fallopian tube when  removing the uterus.  Cancer of the fallopian tube or nearby organs. Removing one fallopian tube does not prevent you from becoming pregnant. It also does not cause problems with your menstrual periods.  LET [facility] CARE PROVIDER KNOW ABOUT:  Any allergies you have.  All medicines you are taking, including vitamins, herbs, eye drops, creams, and over-the-counter medicines.  Previous problems you or members of your family have had with the use of anesthetics.  Any blood disorders you have.  Previous surgeries you have had.  Medical conditions you have. RISKS AND COMPLICATIONS  Generally, this is a safe procedure. However, as with any procedure, complications can occur. Possible complications include:  Injury to surrounding organs.  Bleeding.  Infection.  Problems related to anesthesia. BEFORE THE PROCEDURE  Ask your health care provider about changing or stopping your regular medicines. You may need to stop taking certain medicines, such as aspirin or blood thinners, at least 1 week before the surgery.  Do not eat or drink anything for at least 8 hours before the surgery.  If you smoke, do not smoke for at least 2 weeks before the surgery.  Make plans to have someone drive you home after the procedure or after your hospital stay. Also arrange for someone to help you with activities during recovery. PROCEDURE   You will be given medicine to help you relax before the procedure (sedative). You will  then be given medicine to make you sleep through the procedure (general anesthetic). These medicines will be given through an IV access tube that is put into one of your veins.  Once you are asleep, your lower abdomen will be shaved and cleaned. A thin, flexible tube (catheter) will be placed in your bladder.  The surgeon may use a laparoscopic, robotic, or open technique for this surgery:  In the laparoscopic technique, the surgery is done through two small cuts (incisions) in  the abdomen. A thin, lighted tube with a tiny camera on the end (laparoscope) is inserted into one of the incisions. The tools needed for the procedure are put through the other incision.  A robotic technique may be chosen to perform complex surgery in a small space. In the robotic technique, small incisions will be made. A camera and surgical instruments are passed through the incisions. Surgical instruments will be controlled with the help of a robotic arm.  In the open technique, the surgery is done through one large incision in the abdomen.  Using any of these techniques, the surgeon removes the fallopian tube from where it attaches to the uterus. The blood vessels will be clamped and tied.  The surgeon then uses staples or stitches to close the incision or incisions. AFTER THE PROCEDURE   You will be taken to a recovery area where your progress will be monitored for 1-3 hours.  If the laparoscopic technique was used, you may be allowed to go home after several hours. You may have some shoulder pain after the laparoscopic procedure. This is normal and usually goes away in a day or two.  If the open technique was used, you will be admitted to the hospital for a couple of days.  You will be given pain medicine if needed.  The IV access tube and catheter will be removed before you are discharged.   This information is not intended to replace advice given to you by your health care provider. Make sure you discuss any questions you have with your health care provider.   Document Released: 11/17/2008 Document Revised: 07/22/2014 Document Reviewed: 12/23/2012 Elsevier Interactive Patient Education 2016 Elsevier Inc. Unilateral Salpingo-Oophorectomy Unilateral salpingo-oophorectomy is the surgical removal of one fallopian tube and ovary. The ovaries are small organs that produce eggs in women. The fallopian tubes transport the egg from the ovary to the womb (uterus). A unilateral  salpingo-oophorectomy may be done for various reasons, including:  Infection in the fallopian tube and ovary.  Scar tissue in the fallopian tube and ovary (adhesions).  A cyst or tumor on the ovary.  A need to remove the fallopian tube and ovary when removing the uterus.  Cancer of the fallopian tube or ovary. The removal of one fallopian tube and ovary will not prevent you from becoming pregnant, put you into menopause, or cause problems with your menstrual periods or sex drive. LET [facility] CARE PROVIDER KNOW ABOUT:  Any allergies you have.  All medicines you are taking, including vitamins, herbs, eye drops, creams, and over-the-counter medicines.  Previous problems you or members of your family have had with the use of anesthetics.  Any blood disorders you have.  Previous surgeries you have had.  Medical conditions you have. RISKS AND COMPLICATIONS  Generally, this is a safe procedure. However, as with any procedure, complications can occur. Possible complications include:  Injury to surrounding organs.  Bleeding.  Infection.  Blood clots in the legs or lungs.  Problems related to  anesthesia. BEFORE THE PROCEDURE  Ask your health care provider about changing or stopping your regular medicines. You may need to stop taking certain medicines, such as aspirin or blood thinners, at least 1 week before the surgery.  Do not eat or drink anything for at least 8 hours before the surgery.  If you smoke, do not smoke for at least 2 weeks before the surgery.  Make plans to have someone drive you home after the procedure or after your hospital stay. Also arrange for someone to help you with activities during recovery. PROCEDURE  You will be given medicine to help you relax before the procedure (sedative). You will then be given medicine to make you sleep through the procedure (general anesthetic). These medicines will be given through an IV access tube that is put into one of  your veins.  Once you are asleep, your lower abdomen will be shaved and cleaned. A thin, flexible tube (catheter) will be placed in your bladder.  The surgeon may use a laparoscopic, robotic, or open technique for this surgery:  In the laparoscopic technique, the surgery is done through two small cuts (incisions) in the abdomen. A thin, lighted tube with a tiny camera on the end (laparoscope) is inserted into one of the incisions. The tools needed for the procedure are put through the other incision.  A robotic technique may be chosen to perform complex surgery in a small space. In the robotic technique, small incisions are made. A camera and surgical instruments are passed through the incisions. Surgical instruments are controlled with the help of a robotic arm.  In the open technique, the surgery is done through one large incision in the abdomen.  Using any of these techniques, the surgeon will remove the fallopian tube and ovary. The blood vessels will be clamped and tied.  The surgeon will then use staples or stitches to close the incision or incisions. AFTER THE PROCEDURE  You will be taken to a recovery area where your progress will be monitored for 1-3 hours. Your blood pressure, pulse, and temperature will be checked often. You will remain in the recovery area until you are stable and waking up.  If the laparoscopic technique was used, you may be allowed to go home after several hours. You may have some shoulder pain. This is normal and usually goes away in a day or two.  If the open technique was used, you will be admitted to the hospital for a couple of days.  You will be given pain medicine as necessary.  The IV tube and catheter will be removed before you are discharged.   This information is not intended to replace advice given to you by your health care provider. Make sure you discuss any questions you have with your health care provider.   Document Released: 04/28/2009  Document Revised: 07/06/2013 Document Reviewed: 12/23/2012 Elsevier Interactive Patient Education 2016 Elsevier Inc. Abdominal Hysterectomy Abdominal hysterectomy is a surgical procedure to remove your womb (uterus). Your uterus is the muscular organ that contains a developing baby. This surgery is done for many reasons. You may need an abdominal hysterectomy if you have cancer, growths (tumors), long-term pain, or bleeding. You may also have this procedure if your uterus has slipped down into your vagina (uterine prolapse). Depending on why you need an abdominal hysterectomy, you may also have other reproductive organs removed. These could include the part of your vagina that connects with your uterus (cervix), the organs that make eggs (ovaries),  and the tubes that connect the ovaries to the uterus (fallopian tubes). LET [facility] KNOW ABOUT:   Any allergies you have.  All medicines you are taking, including vitamins, herbs, eye drops, creams, and over-the-counter medicines.  Previous problems you or members of your family have had with the use of anesthetics.  Any blood disorders you have.  Previous surgeries you have had.  Medical conditions you have. RISKS AND COMPLICATIONS Generally, this is a safe procedure. However, as with any procedure, problems can occur. Infection is the most common problem after an abdominal hysterectomy. Other possible problems include:  Bleeding.  Formation of blood clots that may break free and travel to your lungs.  Injury to other organs near your uterus.  Nerve injury causing nerve pain.  Decreased interest in sex or pain during sexual intercourse. BEFORE THE PROCEDURE  Abdominal hysterectomy is a major surgical procedure. It can affect the way you feel about yourself. Talk to your health care provider about the physical and emotional changes hysterectomy may cause.  You may need to have blood work and X-rays done before  surgery.  Quit smoking if you smoke. Ask your health care provider for help if you are struggling to quit.  Stop taking medicines that thin your blood as directed by your health care provider.  You may be instructed to take antibiotic medicines or laxatives before surgery.  Do not eat or drink anything for 6-8 hours before surgery.  Take your regular medicines with a small sip of water.  Bathe or shower the night or morning before surgery. PROCEDURE  Abdominal hysterectomy is done in the operating room at the hospital.  In most cases, you will be given a medicine that makes you go to sleep (general anesthetic).  The surgeon will make a cut (incision) through the skin in your lower belly.  The incision may be about 5-7 inches long. It may go side-to-side or up-and-down.  The surgeon will move aside the body tissue that covers your uterus. The surgeon will then carefully take out your uterus along with any of your other reproductive organs that need to be removed.  Bleeding will be controlled with clamps or sutures.  The surgeon will close your incision with sutures or metal clips. AFTER THE PROCEDURE  You will have some pain immediately after the procedure.  You will be given pain medicine in the recovery room.  You will be taken to your hospital room when you have recovered from the anesthesia.  You may need to stay in the hospital for 2-5 days.  You will be given instructions for recovery at home.   This information is not intended to replace advice given to you by your health care provider. Make sure you discuss any questions you have with your health care provider.   Document Released: 07/06/2013 Document Reviewed: 07/06/2013 Elsevier Interactive Patient Education 2016 Elsevier Inc. Abdominal Hysterectomy, Care After These instructions give you information on caring for yourself after your procedure. Your doctor may also give you more specific instructions. Call your  doctor if you have any problems or questions after your procedure.  HOME CARE It takes 4-6 weeks to recover from this surgery. Follow all of your doctor's instructions.   Only take medicines as told by your doctor.  Change your bandage as told by your doctor.  Return to your doctor to have your stitches taken out.  Take showers for 2-3 weeks. Ask your doctor when it is okay to shower.  Do not douche, use tampons, or have sex (intercourse) for at least 6 weeks or as told.  Follow your doctor's advice about exercise, lifting objects, driving, and general activities.  Get plenty of rest and sleep.  Do not lift anything heavier than a gallon of milk (about 10 pounds [4.5 kilograms]) for the first month after surgery.  Get back to your normal diet as told by your doctor.  Do not drink alcohol until your doctor says it is okay.  Take a medicine to help you poop (laxative) as told by your doctor.  Eating foods high in fiber may help you poop. Eat a lot of raw fruits and vegetables, whole grains, and beans.  Drink enough fluids to keep your pee (urine) clear or pale yellow.  Have someone help you at home for 1-2 weeks after your surgery.  Keep follow-up doctor visits as told. GET HELP IF:  You have chills or fever.  You have puffiness, redness, or pain in area of the cut (incision).  You have yellowish-white fluid (pus) coming from the cut.  You have a bad smell coming from the cut or bandage.  Your cut pulls apart.  You feel dizzy or light-headed.  You have pain or bleeding when you pee.  You keep having watery poop (diarrhea).  You keep feeling sick to your stomach (nauseous) or keep throwing up (vomiting).  You have fluid (discharge) coming from your vagina.  You have a rash.  You have a reaction to your medicine.  You need stronger pain medicine. GET HELP RIGHT AWAY IF:   You have a fever and your symptoms suddenly get worse.  You have bad belly (abdominal)  pain.  You have chest pain.  You are short of breath.  You pass out (faint).  You have pain, puffiness, or redness of your leg.  You bleed a lot from your vagina and notice clumps of tissue (clots). MAKE SURE YOU:   Understand these instructions.  Will watch your condition.  Will get help right away if you are not doing well or get worse.   This information is not intended to replace advice given to you by your health care provider. Make sure you discuss any questions you have with your health care provider.   Document Released: 04/09/2008 Document Revised: 07/06/2013 Document Reviewed: 04/23/2013 Elsevier Interactive Patient Education 2016 Elsevier Inc. PATIENT INSTRUCTIONS POST-ANESTHESIA  IMMEDIATELY FOLLOWING SURGERY:  Do not drive or operate machinery for the first twenty four hours after surgery.  Do not make any important decisions for twenty four hours after surgery or while taking narcotic pain medications or sedatives.  If you develop intractable nausea and vomiting or a severe headache please notify your doctor immediately.  FOLLOW-UP:  Please make an appointment with your surgeon as instructed. You do not need to follow up with anesthesia unless specifically instructed to do so.  WOUND CARE INSTRUCTIONS (if applicable):  Keep a dry clean dressing on the anesthesia/puncture wound site if there is drainage.  Once the wound has quit draining you may leave it open to air.  Generally you should leave the bandage intact for twenty four hours unless there is drainage.  If the epidural site drains for more than 36-48 hours please call the anesthesia department.  QUESTIONS?:  Please feel free to call your physician or the hospital operator if you have any questions, and they will be happy to assist you.

## 2015-10-05 NOTE — Pre-Procedure Instructions (Signed)
Patient given information to sign up for my chart at home. 

## 2015-10-08 LAB — TYPE AND SCREEN
ABO/RH(D): O NEG
ANTIBODY SCREEN: NEGATIVE

## 2015-10-09 NOTE — H&P (Signed)
Norma BurrowJohn Nyeem Stoke V, MD at 10/04/2015 2:20 AM     Status: Signed       Expand All Collapse All   Patient ID: Norma ClassJennifer L [name], female DOB: 01/14/1976, 40 y.o. MRN: [id number]  [facility]  Patient name: Norma AuerJennifer L Samaritan [name] [id number] Date of birth: 04/08/1976  CC & HPI:  Norma [name] is a 40 y.o. female presenting today for rectal bleeding. Was scheduled for pap tomorrow. As part of preop Pt was being considered for endometrial bx but endometrium on u/s is 4 mm reassuring  ROS:  Ovarian mass discussed with Dr Andrey Farmerossi who feels benign characteristics predominate, so will do surgery here with Frozen Section Norma [name] is a 40 y.o. LMP 09/24/2015 for a pelvic sonogram to f/u left adnexal cyst.  Uterus 10.5 x 6.2 x 4 cm, WNL  Endometrium 4.4 mm, symmetrical, WNL  Right ovary 4.2 x 4.3 x 3.4 cm, complex rt ov cyst 3.2 x 3.3 x 3.1cm  Left ovary 12.3 x 8 x 11.6 cm, ,left adnexa has a 12.3 x 8 x 11.6 cm complex cyst w/septations  No free fluid seen,bilat pelvic pain  Technician Comments:  PELVIC US TA/TV: normal homogenous uterus,complex rt ov cyst 3.2 x 3.3 x 3.1cm,left adnexa has a 12.3 x 8 x 11.6 cm complex cyst w/septations,bilat arterial and venous flow seen,no free fluid,bilat pelvic pain during ultrasound,EEC 4.114mm    Norma [name] 09/28/2015 10:22 AM Pertinent History Reviewed:  Reviewed: Significant for see last visit , pap done Medical  Past Medical History  Diagnosis Date  . COPD (chronic obstructive pulmonary disease) (HCC)   . Renal disorder     Kidney stones  . Hypertension   . IBS (irritable bowel syndrome)     Surgical Hx:  Past Surgical History  Procedure Laterality Date  . Dilation and curettage of uterus     Medications: Reviewed & Updated - see associated section    Current outpatient prescriptions:  . albuterol (PROVENTIL HFA;VENTOLIN HFA) 108 (90 BASE) MCG/ACT inhaler, Inhale 2 puffs into the lungs every 6 (six) hours as needed for wheezing., Disp: , Rfl:  . albuterol (PROVENTIL) (2.5 MG/3ML) 0.083% nebulizer solution, Take 2.5 mg by nebulization every 6 (six) hours as needed for wheezing., Disp: , Rfl:  . HYDROcodone-acetaminophen (NORCO/VICODIN) 5-325 MG tablet, Take 1 tablet by mouth every 6 (six) hours as needed for moderate pain., Disp: 30 tablet, Rfl: 0 . OXYGEN, Inhale 2 L into the lungs daily., Disp: , Rfl:  . promethazine (PHENERGAN) 25 MG tablet, Take 1 tablet (25 mg total) by mouth every 6 (six) hours as needed for nausea or vomiting., Disp: 30 tablet, Rfl: 1   Social History: Reviewed -  reports that she has been smoking Cigarettes. She has a 28.5 pack-year smoking history. She has never used smokeless tobacco.  Objective Findings:  Vitals: Blood pressure 122/80, height 5\' 2"  (1.575 m), weight 192 lb (87.091 kg), last menstrual period 09/24/2015.  Physical Examination: General appearance - alert, well appearing, and in no distress, oriented to person, place, and time and normal appearing weight Mental status - alert, oriented to person, place, and time, normal mood, behavior, speech, dress, motor activity, and thought processes Eyes - pupils equal and reactive, extraocular eye movements intact Neck - supple, no significant adenopathy Chest - clear to auscultation, no wheezes, rales or rhonchi, symmetric air entry Heart - normal rate and regular rhythm Abdomen - soft, nontender, nondistended, no masses or organomegaly Pelvic - VULVA:  normal appearing vulva with no masses, tenderness or lesions, VAGINA: normal appearing vagina with normal color and discharge, no lesions, PELVIC FLOOR EXAM: no cystocele, rectocele or prolapse noted, CERVIX: normal appearing cervix without discharge or lesions, cervix deviated forward by the pelvic  mass in cul de sac, and pushing uterus forward , UTERUS: uterus is normal size, shape, consistency and nontender, anteverted, mobile, pushed markedly forward , ADNEXA: mass present right, and cul de sac side, size 11 cm rectium : small anal fissure,at 5 oclock at anal verge no masses or problems suspected.  CBC    Component Value Date/Time   WBC 6.6 10/05/2015 1345   RBC 4.02 10/05/2015 1345   HGB 11.7* 10/05/2015 1345   HCT 35.8* 10/05/2015 1345   PLT 365 10/05/2015 1345   MCV 89.1 10/05/2015 1345   MCH 29.1 10/05/2015 1345   MCHC 32.7 10/05/2015 1345   RDW 15.5 10/05/2015 1345   LYMPHSABS 1.9 09/15/2015 1609   MONOABS 0.6 09/15/2015 1609   EOSABS 0.0 09/15/2015 1609   BASOSABS 0.0 09/15/2015 1609   CMP Latest Ref Rng 10/05/2015 09/15/2015 07/04/2014  Glucose 65 - 99 mg/dL 89 99 098(J)  BUN 6 - 20 mg/dL Creatinine 0.44 - 1.00 mg/dL 1.91 4.78 2.95  Sodium 135 - 145 mmol/L 137 142 141  Potassium 3.5 - 5.1 mmol/L 4.1 3.1(L) 4.1  Chloride 101 - 111 mmol/L 106 109 103  CO2 22 - 32 mmol/L Calcium 8.9 - 10.3 mg/dL 6.2(Z) 3.0(Q) 9.1  Total Protein 6.5 - 8.1 g/dL 6.0(L) 7.0 -  Total Bilirubin 0.3 - 1.2 mg/dL 0.4 0.3 -  Alkaline Phos 38 - 126 U/L 65 56 -  AST 15 - 41 U/L 12(L) 19 -  ALT 14 - 54 U/L 10(L) 15 -      Assessment & Plan:   A:  1. Large pelvic mass with mildly elevated Ca 125, felt benign by gyn Onc.  2  P:  1. Await pap,  Normal pap with negative tests for GC, CHL, and HPV.       2 plan for Abdominal hysterectomy with bilateral salpingo oophorectomy

## 2015-10-10 ENCOUNTER — Encounter (HOSPITAL_COMMUNITY)
Admission: RE | Disposition: A | Discharge status: Home / Self Care | Payer: Self-pay | Source: Ambulatory Visit | Attending: Obstetrics and Gynecology

## 2015-10-10 ENCOUNTER — Ambulatory Visit (HOSPITAL_COMMUNITY)
Admission: RE | Admit: 2015-10-10 | Discharge: 2015-10-10 | Disposition: A | Discharge status: Home / Self Care | Payer: Medicaid Other | Source: Ambulatory Visit | Attending: Obstetrics and Gynecology | Admitting: Obstetrics and Gynecology

## 2015-10-10 ENCOUNTER — Encounter (HOSPITAL_COMMUNITY): Payer: Self-pay | Admitting: Anesthesiology

## 2015-10-10 ENCOUNTER — Encounter (HOSPITAL_COMMUNITY): Payer: Self-pay | Admitting: *Deleted

## 2015-10-10 ENCOUNTER — Other Ambulatory Visit: Payer: Self-pay | Admitting: Obstetrics and Gynecology

## 2015-10-10 DIAGNOSIS — Z538 Procedure and treatment not carried out for other reasons: Secondary | ICD-10-CM | POA: Diagnosis present

## 2015-10-10 SURGERY — HYSTERECTOMY, ABDOMINAL
Anesthesia: General | Laterality: Right

## 2015-10-10 MED ORDER — NICOTINE 21 MG/24HR TD PT24: 21.0000 mg | MEDICATED_PATCH | Freq: Every day | TRANSDERMAL | Status: DC

## 2015-10-10 MED ORDER — HYDROCODONE-ACETAMINOPHEN 5-325 MG PO TABS: 1.0000 | ORAL_TABLET | Freq: Four times a day (QID) | ORAL | Status: DC | PRN

## 2015-10-10 NOTE — Progress Notes (Signed)
Patient arrives for her surgical procedure.  She is hypertensive, and has been taking daily aspirin products for headache. Smoking has continued at 1 pk/day. Case discussed with Anesthesia.  Will postpone hysterectomy, as this could be a technically difficult case, and platelet function must be normal. Will Rx analgesics for headache, as well as pelvic pain. followup 1 week, review bp, and add nicoderm patch.

## 2015-10-10 NOTE — Interval H&P Note (Signed)
History and Physical Interval Note:  10/10/2015 7:25 AM  Josefine ClassJennifer L Tindel  has presented today for surgery, with the diagnosis of PELVIC MASS  The various methods of treatment have been discussed with the patient and family. After consideration of risks, benefits and other options for treatment, the patient has been CANCELLED DUE TO ASPIRIN USE DAILY THAT WAS NOT DISCONTINUED. Marland Kitchen.  She will be rescheduled for 2 weeks.  [name],Orbie Grupe V

## 2015-10-11 ENCOUNTER — Telehealth: Payer: Self-pay | Admitting: Obstetrics and Gynecology

## 2015-10-17 ENCOUNTER — Encounter: Payer: Medicaid Other | Admitting: Obstetrics and Gynecology

## 2015-10-20 ENCOUNTER — Encounter (HOSPITAL_COMMUNITY)
Admission: RE | Admit: 2015-10-20 | Discharge: 2015-10-20 | Disposition: A | Discharge status: Home / Self Care | Payer: Medicaid Other | Source: Ambulatory Visit | Attending: Obstetrics and Gynecology | Admitting: Obstetrics and Gynecology

## 2015-10-20 ENCOUNTER — Encounter (HOSPITAL_COMMUNITY): Payer: Self-pay

## 2015-10-20 ENCOUNTER — Ambulatory Visit (HOSPITAL_COMMUNITY)
Admission: RE | Admit: 2015-10-20 | Discharge: 2015-10-20 | Disposition: A | Discharge status: Home / Self Care | Payer: Medicaid Other | Source: Ambulatory Visit | Attending: Obstetrics and Gynecology | Admitting: Obstetrics and Gynecology

## 2015-10-20 ENCOUNTER — Other Ambulatory Visit (HOSPITAL_COMMUNITY): Payer: Medicaid Other

## 2015-10-20 DIAGNOSIS — D860 Sarcoidosis of lung: Secondary | ICD-10-CM

## 2015-10-20 HISTORY — DX: Depression, unspecified: F32.A

## 2015-10-20 HISTORY — DX: Major depressive disorder, single episode, unspecified: F32.9

## 2015-10-20 LAB — CBC WITH DIFFERENTIAL/PLATELET
BASOS ABS: 0.1 10*3/uL (ref 0.0–0.1)
BASOS PCT: 1 %
Eosinophils Absolute: 0.2 10*3/uL (ref 0.0–0.7)
Eosinophils Relative: 3 %
HEMATOCRIT: 35.1 % — AB (ref 36.0–46.0)
HEMOGLOBIN: 11.5 g/dL — AB (ref 12.0–15.0)
LYMPHS PCT: 31 %
Lymphs Abs: 2.2 10*3/uL (ref 0.7–4.0)
MCH: 29.2 pg (ref 26.0–34.0)
MCHC: 32.8 g/dL (ref 30.0–36.0)
MCV: 89.1 fL (ref 78.0–100.0)
MONO ABS: 0.6 10*3/uL (ref 0.1–1.0)
Monocytes Relative: 8 %
NEUTROS ABS: 4 10*3/uL (ref 1.7–7.7)
NEUTROS PCT: 57 %
Platelets: 327 10*3/uL (ref 150–400)
RBC: 3.94 MIL/uL (ref 3.87–5.11)
RDW: 15.9 % — ABNORMAL HIGH (ref 11.5–15.5)
WBC: 7 10*3/uL (ref 4.0–10.5)

## 2015-10-20 LAB — BASIC METABOLIC PANEL
ANION GAP: 7 (ref 5–15)
BUN: 14 mg/dL (ref 6–20)
CALCIUM: 8.9 mg/dL (ref 8.9–10.3)
CHLORIDE: 107 mmol/L (ref 101–111)
CO2: 25 mmol/L (ref 22–32)
Creatinine, Ser: 0.57 mg/dL (ref 0.44–1.00)
GFR calc non Af Amer: >60 mL/min (ref >60)
GLUCOSE: 87 mg/dL (ref 65–99)
POTASSIUM: 4.4 mmol/L (ref 3.5–5.1)
Sodium: 139 mmol/L (ref 135–145)

## 2015-10-20 LAB — TYPE AND SCREEN
ABO/RH(D): O NEG
Antibody Screen: NEGATIVE

## 2015-10-20 LAB — HCG, SERUM, QUALITATIVE: Preg, Serum: NEGATIVE

## 2015-10-20 NOTE — Patient Instructions (Signed)
Josefine ClassJennifer L Furry  10/20/2015     @PREFPERIOPPHARMACY @   Your procedure is scheduled on 10/24/2015.  [facility]  Call this number if you have problems the morning of surgery:  [phone]   Remember:  Do not eat food or drink liquids after midnight.  Take these medicines the morning of surgery with A SIP OF WATER : Hydrocodone, Mobic and Vistaril   Do not wear jewelry, make-up or nail polish.  Do not wear lotions, powders, or perfumes.  You may wear deodorant.  Do not shave 48 hours prior to surgery.  Men may shave face and neck.  Do not bring valuables to the hospital.  [facility] is not responsible for any belongings or valuables.  Contacts, dentures or bridgework may not be worn into surgery.  Leave your suitcase in the car.  After surgery it may be brought to your room.  For patients admitted to the hospital, discharge time will be determined by your treatment team.  Patients discharged the day of surgery will not be allowed to drive home.   Name and phone number of your driver:   family Special instructions:  n?a  Please read over the following fact sheets that you were given. Care and Recovery After Surgery   Abdominal Hysterectomy Abdominal hysterectomy is a surgical procedure to remove your womb (uterus). Your uterus is the muscular organ that contains a developing baby. This surgery is done for many reasons. You may need an abdominal hysterectomy if you have cancer, growths (tumors), long-term pain, or bleeding. You may also have this procedure if your uterus has slipped down into your vagina (uterine prolapse). Depending on why you need an abdominal hysterectomy, you may also have other reproductive organs removed. These could include the part of your vagina that connects with your uterus (cervix), the organs that make eggs (ovaries), and the tubes that connect the ovaries to the uterus (fallopian tubes). LET [facility] KNOW  ABOUT:   Any allergies you have.  All medicines you are taking, including vitamins, herbs, eye drops, creams, and over-the-counter medicines.  Previous problems you or members of your family have had with the use of anesthetics.  Any blood disorders you have.  Previous surgeries you have had.  Medical conditions you have. RISKS AND COMPLICATIONS Generally, this is a safe procedure. However, as with any procedure, problems can occur. Infection is the most common problem after an abdominal hysterectomy. Other possible problems include:  Bleeding.  Formation of blood clots that may break free and travel to your lungs.  Injury to other organs near your uterus.  Nerve injury causing nerve pain.  Decreased interest in sex or pain during sexual intercourse. BEFORE THE PROCEDURE  Abdominal hysterectomy is a major surgical procedure. It can affect the way you feel about yourself. Talk to your health care provider about the physical and emotional changes hysterectomy may cause.  You may need to have blood work and X-rays done before surgery.  Quit smoking if you smoke. Ask your health care provider for help if you are struggling to quit.  Stop taking medicines that thin your blood as directed by your health care provider.  You may be instructed to take antibiotic medicines or laxatives before surgery.  Do not eat or drink anything for 6-8 hours before surgery.  Take your regular medicines with a small sip of water.  Bathe or shower the night or morning before surgery. PROCEDURE  Abdominal  hysterectomy is done in the operating room at the hospital.  In most cases, you will be given a medicine that makes you go to sleep (general anesthetic).  The surgeon will make a cut (incision) through the skin in your lower belly.  The incision may be about 5-7 inches long. It may go side-to-side or up-and-down.  The surgeon will move aside the body tissue that covers your uterus. The  surgeon will then carefully take out your uterus along with any of your other reproductive organs that need to be removed.  Bleeding will be controlled with clamps or sutures.  The surgeon will close your incision with sutures or metal clips. AFTER THE PROCEDURE  You will have some pain immediately after the procedure.  You will be given pain medicine in the recovery room.  You will be taken to your hospital room when you have recovered from the anesthesia.  You may need to stay in the hospital for 2-5 days.  You will be given instructions for recovery at home.   This information is not intended to replace advice given to you by your health care provider. Make sure you discuss any questions you have with your health care provider.   Document Released: 07/06/2013 Document Reviewed: 07/06/2013 Elsevier Interactive Patient Education [employer]

## 2015-10-24 ENCOUNTER — Ambulatory Visit (HOSPITAL_COMMUNITY)
Admission: RE | Admit: 2015-10-24 | Discharge: 2015-10-24 | Disposition: A | Discharge status: Home / Self Care | Payer: Medicaid Other | Source: Ambulatory Visit | Attending: Obstetrics and Gynecology | Admitting: Obstetrics and Gynecology

## 2015-10-24 ENCOUNTER — Other Ambulatory Visit: Payer: Self-pay | Admitting: Obstetrics and Gynecology

## 2015-10-24 ENCOUNTER — Encounter (HOSPITAL_COMMUNITY): Payer: Self-pay | Admitting: *Deleted

## 2015-10-24 ENCOUNTER — Encounter (HOSPITAL_COMMUNITY)
Admission: RE | Disposition: A | Discharge status: Home / Self Care | Payer: Self-pay | Source: Ambulatory Visit | Attending: Obstetrics and Gynecology

## 2015-10-24 DIAGNOSIS — R971 Elevated cancer antigen 125 [CA 125]: Secondary | ICD-10-CM | POA: Insufficient documentation

## 2015-10-24 DIAGNOSIS — R1900 Intra-abdominal and pelvic swelling, mass and lump, unspecified site: Secondary | ICD-10-CM | POA: Diagnosis not present

## 2015-10-24 DIAGNOSIS — Z5309 Procedure and treatment not carried out because of other contraindication: Secondary | ICD-10-CM | POA: Insufficient documentation

## 2015-10-24 SURGERY — HYSTERECTOMY, ABDOMINAL
Anesthesia: General

## 2015-10-24 MED ORDER — CEFAZOLIN SODIUM-DEXTROSE 2-4 GM/100ML-% IV SOLN: 2.0000 g | INTRAVENOUS | Status: DC

## 2015-10-24 MED ORDER — HYDROCODONE-ACETAMINOPHEN 5-325 MG PO TABS: 1.0000 | ORAL_TABLET | Freq: Four times a day (QID) | ORAL | Status: DC | PRN

## 2015-10-24 MED ORDER — BUPIVACAINE HCL (PF) 0.5 % IJ SOLN
INTRAMUSCULAR | Status: AC
  Filled 2015-10-24: qty 30

## 2015-10-24 NOTE — H&P (View-Only) (Signed)
Tilda BurrowJohn Rydan Gulyas V, MD at 10/04/2015 2:20 AM     Status: Signed       Expand All Collapse All   Patient ID: Norma ClassJennifer L [name], female DOB: 12/04/1975, 40 y.o. MRN: [id number]  [facility]  Patient name: [facility]ennifer L [facility] [id number] Date of birth: 05/24/1976  CC & HPI:  Norma ClassJennifer L Neyman is a 40 y.o. female presenting today for rectal bleeding. Was scheduled for pap tomorrow. As part of preop Pt was being considered for endometrial bx but endometrium on u/s is 4 mm reassuring  ROS:  Ovarian mass discussed with Dr Andrey Farmerossi who feels benign characteristics predominate, so will do surgery here with Frozen Section Norma ClassJennifer L Neubauer is a 40 y.o. LMP 09/24/2015 for a pelvic sonogram to f/u left adnexal cyst.  Uterus 10.5 x 6.2 x 4 cm, WNL  Endometrium 4.4 mm, symmetrical, WNL  Right ovary 4.2 x 4.3 x 3.4 cm, complex rt ov cyst 3.2 x 3.3 x 3.1cm  Left ovary 12.3 x 8 x 11.6 cm, ,left adnexa has a 12.3 x 8 x 11.6 cm complex cyst w/septations  No free fluid seen,bilat pelvic pain  Technician Comments:  PELVIC US TA/TV: normal homogenous uterus,complex rt ov cyst 3.2 x 3.3 x 3.1cm,left adnexa has a 12.3 x 8 x 11.6 cm complex cyst w/septations,bilat arterial and venous flow seen,no free fluid,bilat pelvic pain during ultrasound,EEC 4.284mm    Amber J Carl 09/28/2015 10:22 AM Pertinent History Reviewed:  Reviewed: Significant for see last visit , pap done Medical  Past Medical History  Diagnosis Date  . COPD (chronic obstructive pulmonary disease) (HCC)   . Renal disorder     Kidney stones  . Hypertension   . IBS (irritable bowel syndrome)     Surgical Hx:  Past Surgical History  Procedure Laterality Date  . Dilation and curettage of uterus     Medications: Reviewed & Updated - see associated section    Current outpatient prescriptions:  . albuterol (PROVENTIL HFA;VENTOLIN HFA) 108 (90 BASE) MCG/ACT inhaler, Inhale 2 puffs into the lungs every 6 (six) hours as needed for wheezing., Disp: , Rfl:  . albuterol (PROVENTIL) (2.5 MG/3ML) 0.083% nebulizer solution, Take 2.5 mg by nebulization every 6 (six) hours as needed for wheezing., Disp: , Rfl:  . HYDROcodone-acetaminophen (NORCO/VICODIN) 5-325 MG tablet, Take 1 tablet by mouth every 6 (six) hours as needed for moderate pain., Disp: 30 tablet, Rfl: 0 . OXYGEN, Inhale 2 L into the lungs daily., Disp: , Rfl:  . promethazine (PHENERGAN) 25 MG tablet, Take 1 tablet (25 mg total) by mouth every 6 (six) hours as needed for nausea or vomiting., Disp: 30 tablet, Rfl: 1   Social History: Reviewed -  reports that she has been smoking Cigarettes. She has a 28.5 pack-year smoking history. She has never used smokeless tobacco.  Objective Findings:  Vitals: Blood pressure 122/80, height 5\' 2"  (1.575 m), weight 192 lb (87.091 kg), last menstrual period 09/24/2015.  Physical Examination: General appearance - alert, well appearing, and in no distress, oriented to person, place, and time and normal appearing weight Mental status - alert, oriented to person, place, and time, normal mood, behavior, speech, dress, motor activity, and thought processes Eyes - pupils equal and reactive, extraocular eye movements intact Neck - supple, no significant adenopathy Chest - clear to auscultation, no wheezes, rales or rhonchi, symmetric air entry Heart - normal rate and regular rhythm Abdomen - soft, nontender, nondistended, no masses or organomegaly Pelvic - VULVA:  normal appearing vulva with no masses, tenderness or lesions, VAGINA: normal appearing vagina with normal color and discharge, no lesions, PELVIC FLOOR EXAM: no cystocele, rectocele or prolapse noted, CERVIX: normal appearing cervix without discharge or lesions, cervix deviated forward by the pelvic  mass in cul de sac, and pushing uterus forward , UTERUS: uterus is normal size, shape, consistency and nontender, anteverted, mobile, pushed markedly forward , ADNEXA: mass present right, and cul de sac side, size 11 cm rectium : small anal fissure,at 5 oclock at anal verge no masses or problems suspected.  CBC    Component Value Date/Time   WBC 6.6 10/05/2015 1345   RBC 4.02 10/05/2015 1345   HGB 11.7* 10/05/2015 1345   HCT 35.8* 10/05/2015 1345   PLT 365 10/05/2015 1345   MCV 89.1 10/05/2015 1345   MCH 29.1 10/05/2015 1345   MCHC 32.7 10/05/2015 1345   RDW 15.5 10/05/2015 1345   LYMPHSABS 1.9 09/15/2015 1609   MONOABS 0.6 09/15/2015 1609   EOSABS 0.0 09/15/2015 1609   BASOSABS 0.0 09/15/2015 1609   CMP Latest Ref Rng 10/05/2015 09/15/2015 07/04/2014  Glucose 65 - 99 mg/dL 89 99 045(W)  BUN 6 - 20 mg/dL Creatinine 0.44 - 1.00 mg/dL 0.98 1.19 1.47  Sodium 135 - 145 mmol/L 137 142 141  Potassium 3.5 - 5.1 mmol/L 4.1 3.1(L) 4.1  Chloride 101 - 111 mmol/L 106 109 103  CO2 22 - 32 mmol/L Calcium 8.9 - 10.3 mg/dL 8.2(N) 5.6(O) 9.1  Total Protein 6.5 - 8.1 g/dL 6.0(L) 7.0 -  Total Bilirubin 0.3 - 1.2 mg/dL 0.4 0.3 -  Alkaline Phos 38 - 126 U/L 65 56 -  AST 15 - 41 U/L 12(L) 19 -  ALT 14 - 54 U/L 10(L) 15 -      Assessment & Plan:   A:  1. Large pelvic mass with mildly elevated Ca 125, felt benign by gyn Onc.  2  P:  1. Await pap,  Normal pap with negative tests for GC, CHL, and HPV.       2 plan for Abdominal hysterectomy with bilateral salpingo oophorectomy

## 2015-10-24 NOTE — Telephone Encounter (Signed)
Call to [facility], asking that bp meds be addressed, so that surgery can proceed.  Secretary will call pt today for appt for bp med assessment.

## 2015-10-24 NOTE — Discharge Instructions (Addendum)
See general physician as soon as possible to assess BP.

## 2015-10-24 NOTE — Interval H&P Note (Signed)
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 After consideration of risks, benefits and other options for treatment, the patient has consented to  Procedure(s): HYSTERECTOMY ABDOMINAL (N/A) SALPINGO OOPHORECTOMY (Bilateral) EXCISION VULVAR LESION (N/A) as a surgical intervention .  The patient's history has been reviewed, patient examined, no change in status, stable for surgery.  I have reviewed the patient's chart and labs.  Questions were answered to the patient's satisfaction.    At her arrival and assessment , the patient has uncontrolled hypertension that anesthesia is very concerned about and recommends treatment prior to the case.  todays pressures are 150-174/ 100-109, on  Repeated rechecks, even with pt supine , calm, and after bowel prep yesterday that should have contributed to a lower pressure. The patient reports that she was previously on HTN meds, and ran out, then was not renewed at her last Primary care visit, [name]. I will have pt seen by Dr Norma [name], and [facility], so as to eliminate this obstacle to surgery. Apologies to patient made, will see pt in followup promptly for rescheduling.  Norma [name],Norma [name]

## 2015-10-24 NOTE — Progress Notes (Signed)
Patient arrived today for surgery BP 161/109 HR 92, no c'o of pain. Patient is not on medication for hypertension at this time, she states that she had been on medication 6 months ago, never had it refilled and followed up with general physician.  BP ranged this am from 161/109 up as high as 168/119 with her heart rate range 92-105. Dr. Jayme CloudGonzalez along with Dr. Emelda FearFerguson decide to postpone surgery until she can see her general physician and get BP under control. Dr. Emelda FearFerguson has given pt instruction to see MD ASAP. Patient agrees.

## 2015-10-31 ENCOUNTER — Telehealth: Payer: Self-pay | Admitting: *Deleted

## 2015-10-31 NOTE — Telephone Encounter (Signed)
Pt states that she has been checking her BP everyday a few times a day. Pt is unsure of the medications that she was put on. Pt needs an appointment in our office for BP check. Pt verbalized understanding and phone call was switched to front office and appointment was given.

## 2015-11-02 ENCOUNTER — Ambulatory Visit (INDEPENDENT_AMBULATORY_CARE_PROVIDER_SITE_OTHER): Payer: Medicaid Other | Admitting: Obstetrics and Gynecology

## 2015-11-02 ENCOUNTER — Encounter: Payer: Self-pay | Admitting: Obstetrics and Gynecology

## 2015-11-02 VITALS — BP 110/72 | Ht 62.0 in

## 2015-11-02 DIAGNOSIS — R1900 Intra-abdominal and pelvic swelling, mass and lump, unspecified site: Secondary | ICD-10-CM

## 2015-11-02 NOTE — Progress Notes (Signed)
Patient ID: QUINLYN TEP, female   DOB: 16-Apr-1976, 40 y.o.   MRN: [id number]  Preoperative History and Physical  Norma [name] is a 40 y.o. No obstetric history on file. here for surgical management of pelvic mass. Patient has been scheduled for the surgery twice before but the surgery was canceled both times due to patient's elevated blood pressure. Patient has been controlling her blood pressure with HCTZ 25 and lisinopril 10.No significant preoperative concerns.  Proposed surgery: Abdominal hysterectomy with bilateral salpingo oophorectomy  Past Medical History  Diagnosis Date  . COPD (chronic obstructive pulmonary disease) (HCC)   . Hypertension   . IBS (irritable bowel syndrome)   . Anxiety   . History of kidney stones   . Depression    Past Surgical History  Procedure Laterality Date  . Dilation and curettage of uterus     OB History  No data available  Patient denies any other pertinent gynecologic issues.   Current Outpatient Prescriptions on File Prior to Visit  Medication Sig Dispense Refill  . albuterol (PROVENTIL HFA;VENTOLIN HFA) 108 (90 BASE) MCG/ACT inhaler Inhale 2 puffs into the lungs every 6 (six) hours as needed for wheezing.    Marland Kitchen albuterol (PROVENTIL) (2.5 MG/3ML) 0.083% nebulizer solution Take 2.5 mg by nebulization every 6 (six) hours as needed for wheezing.    Marland Kitchen HYDROcodone-acetaminophen (NORCO/VICODIN) 5-325 MG tablet Take 1 tablet by mouth every 6 (six) hours as needed for moderate pain. 45 tablet 0  . nicotine (NICODERM CQ) 21 mg/24hr patch Place 1 patch (21 mg total) onto the skin daily. 28 patch 2  . OXYGEN Inhale 2 L into the lungs daily.    . promethazine (PHENERGAN) 25 MG tablet Take 1 tablet (25 mg total) by mouth every 6 (six) hours as needed for nausea or vomiting. 30 tablet 1  . QVAR 80 MCG/ACT inhaler Inhale 2 puffs into the lungs daily.  5  . SPIRIVA HANDIHALER 18 MCG inhalation capsule Place 1 capsule into inhaler and inhale daily.  5    No current facility-administered medications on file prior to visit.   No Known Allergies  Social History:   reports that she has been smoking Cigarettes.  She has a 19 pack-year smoking history. She has never used smokeless tobacco. She reports that she does not drink alcohol or use illicit drugs.  Family History  Problem Relation Age of Onset  . COPD Mother   . Diabetes Father   . Hypertension Father   . Heart disease Sister   . Heart disease Maternal Aunt   . Birth defects Daughter   . Kidney disease Daughter     Review of Systems: Noncontributory  PHYSICAL EXAM: Blood pressure 110/72, height  (1.575 m), last menstrual period 09/24/2015. General appearance - alert, well appearing, and in no distress Chest - clear to auscultation, no wheezes, rales or rhonchi, symmetric air entry Heart - normal rate and regular rhythm Abdomen - soft, nontender, nondistended, no masses or organomegaly Pelvic - examination  VULVA: 10 vulvar skin tags  VAGINA: normal appearing vagina with normal color and discharge, no lesions,  PELVIC FLOOR EXAM: no cystocele, rectocele or prolapse noted,  CERVIX: normal appearing cervix without discharge or lesions, well-supported,  UTERUS: uterus is normal size, shape, consistency and nontender, anteverted, mobile, pushed markedly forward ADNEXA: mass present right, and cul de sac side, size 11 cm Extremities - peripheral pulses normal, no pedal edema, no clubbing or cyanosis  Labs: Results for orders placed or  performed during the hospital encounter of 10/20/15 (from the past 336 hour(s))  CBC with Differential/Platelet   Collection Time: 10/20/15  2:49 PM  Result Value Ref Range   WBC 7.0 4.0 - 10.5 K/uL   RBC 3.94 3.87 - 5.11 MIL/uL   Hemoglobin 11.5 (L) 12.0 - 15.0 g/dL   HCT 86.535.1 (L) 78.436.0 - 69.646.0 %   MCV 89.1 78.0 - 100.0 fL   MCH 29.2 26.0 - 34.0 pg   MCHC 32.8 30.0 - 36.0 g/dL   RDW 29.515.9 (H) 28.411.5 - 13.215.5 %   Platelets 327 150 - 400 K/uL    Neutrophils Relative % 57 %   Neutro Abs 4.0 1.7 - 7.7 K/uL   Lymphocytes Relative 31 %   Lymphs Abs 2.2 0.7 - 4.0 K/uL   Monocytes Relative 8 %   Monocytes Absolute 0.6 0.1 - 1.0 K/uL   Eosinophils Relative 3 %   Eosinophils Absolute 0.2 0.0 - 0.7 K/uL   Basophils Relative 1 %   Basophils Absolute 0.1 0.0 - 0.1 K/uL  Basic metabolic panel   Collection Time: 10/20/15  2:49 PM  Result Value Ref Range   Sodium 139 135 - 145 mmol/L   Potassium 4.4 3.5 - 5.1 mmol/L   Chloride 107 101 - 111 mmol/L   CO2 25 22 - 32 mmol/L   Glucose, Bld 87 65 - 99 mg/dL   BUN 14 6 - 20 mg/dL   Creatinine, Ser 4.400.57 0.44 - 1.00 mg/dL   Calcium 8.9 8.9 - 10.210.3 mg/dL   GFR calc non Af Amer >60 >60 mL/min   GFR calc Af Amer >60 >60 mL/min   Anion gap 7 5 - 15  hCG, serum, qualitative   Collection Time: 10/20/15  2:49 PM  Result Value Ref Range   Preg, Serum NEGATIVE NEGATIVE  Type and screen [facility]   Collection Time: 10/20/15  2:49 PM  Result Value Ref Range   ABO/RH(D) O NEG    Antibody Screen NEG    Sample Expiration 11/03/2015    Extend sample reason NO TRANSFUSIONS OR PREGNANCY IN THE PAST 3 MONTHS     Imaging Studies: Dg Chest 2 View  10/20/2015  CLINICAL DATA:  Sarcoidosis. Patient preop for hysterectomy. Current smoker. EXAM: CHEST  2 VIEW COMPARISON:  02/13/2010 FINDINGS: Cardiac silhouette is normal in size and configuration. Are more mediastinal and hilar contours. Clear lungs.  No pleural effusion or pneumothorax Bony thorax unremarkable. IMPRESSION: No active cardiopulmonary disease. Electronically Signed   By: Amie Portlandavid  Ormond M.D.   On: 10/20/2015 16:04    Assessment: Patient Active Problem List   Diagnosis Date Noted  . Rectal fissure 10/04/2015  . Pelvic mass 09/20/2015    Plan: Patient will undergo surgical management with abdominal hysterectomy with bilateral salpingo oophorectomy.    .mec 11/02/2015 10:07 AM   By signing my name below, I, Ronney LionSuzanne Le, attest  that this documentation has been prepared under the direction and in the presence of Tilda BurrowJohn Yenifer Saccente V, MD. Electronically Signed: Ronney LionSuzanne Le, ED Scribe. 11/02/2015. 10:19 AM.  I personally performed the services described in this documentation, which was SCRIBED in my presence. The recorded information has been reviewed and considered accurate. It has been edited as necessary during review. [name],Aleathea Pugmire V, MD

## 2015-11-02 NOTE — Progress Notes (Signed)
Patient ID: Norma [name], female   DOB: 01/06/1976, 40 y.o.   MRN: [id number] Pt here today for BP recheck and pre op exam. Pt to reschedule surgery.

## 2015-11-06 NOTE — Patient Instructions (Signed)
Norma [name]  11/06/2015     @   Your procedure is scheduled on  11/09/2015   Report to [facility] at  730  A.M.  Call this number if you have problems the morning of surgery:  [phone]   Remember:  Do not eat food or drink liquids after midnight.  Take these medicines the morning of surgery with A SIP OF WATER  Hydrocodone, phenergan, lisinopril. Take your nebulizer and your inhaler before you come.   Do not wear jewelry, make-up or nail polish.  Do not wear lotions, powders, or perfumes.  You may wear deodorant.  Do not shave 48 hours prior to surgery.  Men may shave face and neck.  Do not bring valuables to the hospital.  [facility] is not responsible for any belongings or valuables.  Contacts, dentures or bridgework may not be worn into surgery.  Leave your suitcase in the car.  After surgery it may be brought to your room.  For patients admitted to the hospital, discharge time will be determined by your treatment team.  Patients discharged the day of surgery will not be allowed to drive home.   Name and phone number of your driver:   family Special instructions:  Follow the diet and prep instructions given to you by Dr Rayna Sexton office.  Please read over the following fact sheets that you were given. Coughing and Deep Breathing, Blood Transfusion Information, MRSA Information, Surgical Site Infection Prevention, Anesthesia Post-op Instructions and Care and Recovery After Surgery      Bilateral Salpingo-Oophorectomy Bilateral salpingo-oophorectomy is the surgical removal of both fallopian tubes and both ovaries. The ovaries are small organs that produce eggs in women. The fallopian tubes transport the egg from the ovary to the womb (uterus). Usually, when this surgery is done, the uterus was previously removed. A bilateral salpingo-oophorectomy may be done to treat cancer or to reduce the risk of cancer in women who are at  high risk. Removing both fallopian tubes and both ovaries will make you unable to become pregnant (sterile). It will also put you into menopause so that you will no longer have menstrual periods and may have menopausal symptoms such as hot flashes, night sweats, and mood changes. It will not affect your sex drive. LET [facility] CARE PROVIDER KNOW ABOUT:  Any allergies you have.  All medicines you are taking, including vitamins, herbs, eye drops, creams, and over-the-counter medicines.  Previous problems you or members of your family have had with the use of anesthetics.  Any blood disorders you have.  Previous surgeries you have had.  Medical conditions you have. RISKS AND COMPLICATIONS Generally, this is a safe procedure. However, as with any procedure, complications can occur. Possible complications include:  Injury to surrounding organs.  Bleeding.  Infection.  Blood clots in the legs or lungs.  Problems related to anesthesia. BEFORE THE PROCEDURE  Ask your health care provider about changing or stopping your regular medicines. You may need to stop taking certain medicines, such as aspirin or blood thinners, at least 1 week before the surgery.  Do not eat or drink anything for at least 8 hours before the surgery.  If you smoke, do not smoke for at least 2 weeks before the surgery.  Make plans to have someone drive you home after the procedure or after your hospital stay. Also arrange for someone to help you with activities during  recovery. PROCEDURE   You will be given medicine to help you relax before the procedure (sedative). You will then be given medicine to make you sleep through the procedure (general anesthetic). These medicines will be given through an IV access tube that is put into one of your veins.  Once you are asleep, your lower abdomen will be shaved and cleaned. A thin, flexible tube (catheter) will be placed in your bladder.  The surgeon may use a  laparoscopic, robotic, or open technique for this surgery:  In the laparoscopic technique, the surgery is done through two small cuts (incisions) in the abdomen. A thin, lighted tube with a tiny camera on the end (laparoscope) is inserted into one of the incisions. The tools needed for the procedure are put through the other incision.  A robotic technique may be chosen to perform complex surgery in a small space. In the robotic technique, small incisions will be made. A camera and surgical instruments are passed through the incisions. Surgical instruments will be controlled with the help of a robotic arm.  In the open technique, the surgery is done through one large incision in the abdomen.  Using any of these techniques, the surgeon removes the fallopian tubes and ovaries. The blood vessels will be clamped and tied.  The surgeon then uses staples or stitches to close the incision or incisions. AFTER THE PROCEDURE  You will be taken to a recovery area where you will be monitored for 1 to 3 hours. Your blood pressure, pulse, and temperature will be checked often. You will remain in the recovery area until you are stable and waking up.  If the laparoscopic technique was used, you may be allowed to go home after several hours. You may have some shoulder pain after the laparoscopic procedure. This is normal and usually goes away in a day or two.  If the open technique was used, you will be admitted to the hospital for a couple of days.  You will be given pain medicine as needed.  The IV access tube and catheter will be removed before you are discharged.   This information is not intended to replace advice given to you by your health care provider. Make sure you discuss any questions you have with your health care provider.   Document Released: 07/01/2005 Document Revised: 07/06/2013 Document Reviewed: 12/23/2012 Elsevier Interactive Patient Education 2016 [employer] Bilateral  Salpingo-Oophorectomy, Care After Refer to this sheet in the next few weeks. These instructions provide you with information on caring for yourself after your procedure. Your health care provider may also give you more specific instructions. Your treatment has been planned according to current medical practices, but problems sometimes occur. Call your health care provider if you have any problems or questions after your procedure. WHAT TO EXPECT AFTER THE PROCEDURE After your procedure, it is typical to have the following:   Abdominal pain that can be controlled with medicine.  Vaginal spotting.  Constipation.  Menopausal symptoms such as hot flashes, vaginal dryness, and mood swings. HOME CARE INSTRUCTIONS   Get plenty of rest and sleep.  Only take over-the-counter or prescription medicines as directed by your health care provider. Do not take aspirin. It can cause bleeding.  Keep incision areas clean and dry. Remove or change bandages (dressings) only as directed by your health care provider.  Take showers instead of baths for a few weeks as directed by your health care provider.  Limit exercise and activities as directed by your  health care provider. Do not lift anything heavier than 5 pounds (2.3 kg) until your health care provider approves.  Do not drive until your health care provider approves.  Follow your health care provider's advice regarding diet. You may be able to resume your usual diet right away.  Drink enough fluids to keep your urine clear or pale yellow.  Do not douche, use tampons, or have sexual intercourse for 6 weeks after the procedure.  Do not drink alcohol until your health care provider says it is okay.  Take your temperature twice a day and write it down.  If you become constipated, you may:  Ask your health care provider about taking a mild laxative.  Add more fruit and bran to your diet.  Drink more fluids.  Follow up with your health care  provider as directed. SEEK MEDICAL CARE IF:   You have swelling, redness, or increasing pain in the incision area.  You see pus coming from the incision area.  You notice a bad smell coming from the wound or dressing.  You have pain, redness, or swelling where the IV access tube was placed.  Your incision is breaking open (the edges are not staying together).  You feel dizzy or feel like fainting.  You develop pain or bleeding when you urinate.  You develop diarrhea.  You develop nausea and vomiting.  You develop abnormal vaginal discharge.  You develop a rash.  You have pain that is not controlled with medicine. SEEK IMMEDIATE MEDICAL CARE IF:   You develop a fever.  You develop abdominal pain.  You have chest pain.  You develop shortness of breath.  You pass out.  You develop pain, swelling, or redness in your leg.  You develop heavy vaginal bleeding with or without blood clots.   This information is not intended to replace advice given to you by your health care provider. Make sure you discuss any questions you have with your health care provider.   Document Released: 07/01/2005 Document Revised: 03/03/2013 Document Reviewed: 12/23/2012 Elsevier Interactive Patient Education 2016 [employer] Abdominal Hysterectomy Abdominal hysterectomy is a surgical procedure to remove your womb (uterus). Your uterus is the muscular organ that contains a developing baby. This surgery is done for many reasons. You may need an abdominal hysterectomy if you have cancer, growths (tumors), long-term pain, or bleeding. You may also have this procedure if your uterus has slipped down into your vagina (uterine prolapse). Depending on why you need an abdominal hysterectomy, you may also have other reproductive organs removed. These could include the part of your vagina that connects with your uterus (cervix), the organs that make eggs (ovaries), and the tubes that connect the ovaries to  the uterus (fallopian tubes). LET [facility] CARE PROVIDER KNOW ABOUT:   Any allergies you have.  All medicines you are taking, including vitamins, herbs, eye drops, creams, and over-the-counter medicines.  Previous problems you or members of your family have had with the use of anesthetics.  Any blood disorders you have.  Previous surgeries you have had.  Medical conditions you have. RISKS AND COMPLICATIONS Generally, this is a safe procedure. However, as with any procedure, problems can occur. Infection is the most common problem after an abdominal hysterectomy. Other possible problems include:  Bleeding.  Formation of blood clots that may break free and travel to your lungs.  Injury to other organs near your uterus.  Nerve injury causing nerve pain.  Decreased interest in sex or pain during  sexual intercourse. BEFORE THE PROCEDURE  Abdominal hysterectomy is a major surgical procedure. It can affect the way you feel about yourself. Talk to your health care provider about the physical and emotional changes hysterectomy may cause.  You may need to have blood work and X-rays done before surgery.  Quit smoking if you smoke. Ask your health care provider for help if you are struggling to quit.  Stop taking medicines that thin your blood as directed by your health care provider.  You may be instructed to take antibiotic medicines or laxatives before surgery.  Do not eat or drink anything for 6-8 hours before surgery.  Take your regular medicines with a small sip of water.  Bathe or shower the night or morning before surgery. PROCEDURE  Abdominal hysterectomy is done in the operating room at the hospital.  In most cases, you will be given a medicine that makes you go to sleep (general anesthetic).  The surgeon will make a cut (incision) through the skin in your lower belly.  The incision may be about 5-7 inches long. It may go side-to-side or up-and-down.  The surgeon  will move aside the body tissue that covers your uterus. The surgeon will then carefully take out your uterus along with any of your other reproductive organs that need to be removed.  Bleeding will be controlled with clamps or sutures.  The surgeon will close your incision with sutures or metal clips. AFTER THE PROCEDURE  You will have some pain immediately after the procedure.  You will be given pain medicine in the recovery room.  You will be taken to your hospital room when you have recovered from the anesthesia.  You may need to stay in the hospital for 2-5 days.  You will be given instructions for recovery at home.   This information is not intended to replace advice given to you by your health care provider. Make sure you discuss any questions you have with your health care provider.   Document Released: 07/06/2013 Document Reviewed: 07/06/2013 Elsevier Interactive Patient Education 2016 [employer] Inc. Abdominal Hysterectomy, Care After These instructions give you information on caring for yourself after your procedure. Your doctor may also give you more specific instructions. Call your doctor if you have any problems or questions after your procedure.  HOME CARE It takes 4-6 weeks to recover from this surgery. Follow all of your doctor's instructions.   Only take medicines as told by your doctor.  Change your bandage as told by your doctor.  Return to your doctor to have your stitches taken out.  Take showers for 2-3 weeks. Ask your doctor when it is okay to shower.  Do not douche, use tampons, or have sex (intercourse) for at least 6 weeks or as told.  Follow your doctor's advice about exercise, lifting objects, driving, and general activities.  Get plenty of rest and sleep.  Do not lift anything heavier than a gallon of milk (about 10 pounds [4.5 kilograms]) for the first month after surgery.  Get back to your normal diet as told by your doctor.  Do not drink  alcohol until your doctor says it is okay.  Take a medicine to help you poop (laxative) as told by your doctor.  Eating foods high in fiber may help you poop. Eat a lot of raw fruits and vegetables, whole grains, and beans.  Drink enough fluids to keep your pee (urine) clear or pale yellow.  Have someone help you at home for 1-2  weeks after your surgery.  Keep follow-up doctor visits as told. GET HELP IF:  You have chills or fever.  You have puffiness, redness, or pain in area of the cut (incision).  You have yellowish-white fluid (pus) coming from the cut.  You have a bad smell coming from the cut or bandage.  Your cut pulls apart.  You feel dizzy or light-headed.  You have pain or bleeding when you pee.  You keep having watery poop (diarrhea).  You keep feeling sick to your stomach (nauseous) or keep throwing up (vomiting).  You have fluid (discharge) coming from your vagina.  You have a rash.  You have a reaction to your medicine.  You need stronger pain medicine. GET HELP RIGHT AWAY IF:   You have a fever and your symptoms suddenly get worse.  You have bad belly (abdominal) pain.  You have chest pain.  You are short of breath.  You pass out (faint).  You have pain, puffiness, or redness of your leg.  You bleed a lot from your vagina and notice clumps of tissue (clots). MAKE SURE YOU:   Understand these instructions.  Will watch your condition.  Will get help right away if you are not doing well or get worse.   This information is not intended to replace advice given to you by your health care provider. Make sure you discuss any questions you have with your health care provider.   Document Released: 04/09/2008 Document Revised: 07/06/2013 Document Reviewed: 04/23/2013 Elsevier Interactive Patient Education 2016 [employer]. PATIENT INSTRUCTIONS POST-ANESTHESIA  IMMEDIATELY FOLLOWING SURGERY:  Do not drive or operate machinery for the first  twenty four hours after surgery.  Do not make any important decisions for twenty four hours after surgery or while taking narcotic pain medications or sedatives.  If you develop intractable nausea and vomiting or a severe headache please notify your doctor immediately.  FOLLOW-UP:  Please make an appointment with your surgeon as instructed. You do not need to follow up with anesthesia unless specifically instructed to do so.  WOUND CARE INSTRUCTIONS (if applicable):  Keep a dry clean dressing on the anesthesia/puncture wound site if there is drainage.  Once the wound has quit draining you may leave it open to air.  Generally you should leave the bandage intact for twenty four hours unless there is drainage.  If the epidural site drains for more than 36-48 hours please call the anesthesia department.  QUESTIONS?:  Please feel free to call your physician or the hospital operator if you have any questions, and they will be happy to assist you.

## 2015-11-07 ENCOUNTER — Encounter (HOSPITAL_COMMUNITY)
Admission: RE | Admit: 2015-11-07 | Discharge: 2015-11-07 | Disposition: A | Discharge status: Home / Self Care | Payer: Medicaid Other | Source: Ambulatory Visit | Attending: Obstetrics and Gynecology | Admitting: Obstetrics and Gynecology

## 2015-11-07 ENCOUNTER — Other Ambulatory Visit: Payer: Self-pay | Admitting: Obstetrics and Gynecology

## 2015-11-07 DIAGNOSIS — Z01812 Encounter for preprocedural laboratory examination: Secondary | ICD-10-CM | POA: Insufficient documentation

## 2015-11-07 LAB — URINALYSIS, ROUTINE W REFLEX MICROSCOPIC
Bilirubin Urine: NEGATIVE
Glucose, UA: NEGATIVE mg/dL
KETONES UR: NEGATIVE mg/dL
NITRITE: NEGATIVE
PH: 5.5 (ref 5.0–8.0)
SPECIFIC GRAVITY, URINE: 1.02 (ref 1.005–1.030)

## 2015-11-07 LAB — CBC
HCT: 38.2 % (ref 36.0–46.0)
Hemoglobin: 12.6 g/dL (ref 12.0–15.0)
MCH: 28.6 pg (ref 26.0–34.0)
MCHC: 33 g/dL (ref 30.0–36.0)
MCV: 86.6 fL (ref 78.0–100.0)
Platelets: 317 K/uL (ref 150–400)
RBC: 4.41 MIL/uL (ref 3.87–5.11)
RDW: 15.3 % (ref 11.5–15.5)
WBC: 10 K/uL (ref 4.0–10.5)

## 2015-11-07 LAB — BASIC METABOLIC PANEL WITH GFR
Anion gap: 10 (ref 5–15)
BUN: 13 mg/dL (ref 6–20)
CO2: 26 mmol/L (ref 22–32)
Calcium: 9.4 mg/dL (ref 8.9–10.3)
Chloride: 101 mmol/L (ref 101–111)
Creatinine, Ser: 0.67 mg/dL (ref 0.44–1.00)
GFR calc Af Amer: >60 mL/min
GFR calc non Af Amer: >60 mL/min
Glucose, Bld: 97 mg/dL (ref 65–99)
Potassium: 3.6 mmol/L (ref 3.5–5.1)
Sodium: 137 mmol/L (ref 135–145)

## 2015-11-07 LAB — TYPE AND SCREEN
ABO/RH(D): O NEG
Antibody Screen: NEGATIVE

## 2015-11-07 LAB — URINE MICROSCOPIC-ADD ON: RBC / HPF: NONE SEEN RBC/hpf (ref 0–5)

## 2015-11-09 ENCOUNTER — Inpatient Hospital Stay (HOSPITAL_COMMUNITY): Payer: Medicaid Other | Admitting: Anesthesiology

## 2015-11-09 ENCOUNTER — Encounter (HOSPITAL_COMMUNITY)
Admission: RE | Disposition: A | Discharge status: Home / Self Care | Payer: Self-pay | Source: Ambulatory Visit | Attending: Obstetrics and Gynecology

## 2015-11-09 ENCOUNTER — Encounter (HOSPITAL_COMMUNITY): Payer: Self-pay | Admitting: *Deleted

## 2015-11-09 ENCOUNTER — Inpatient Hospital Stay (HOSPITAL_COMMUNITY)
Admission: RE | Admit: 2015-11-09 | Discharge: 2015-11-10 | DRG: 743 | Disposition: A | Discharge status: Home / Self Care | Payer: Medicaid Other | Source: Ambulatory Visit | Attending: Obstetrics and Gynecology | Admitting: Obstetrics and Gynecology

## 2015-11-09 DIAGNOSIS — Z833 Family history of diabetes mellitus: Secondary | ICD-10-CM | POA: Diagnosis not present

## 2015-11-09 DIAGNOSIS — N808 Other endometriosis: Principal | ICD-10-CM | POA: Diagnosis present

## 2015-11-09 DIAGNOSIS — F17210 Nicotine dependence, cigarettes, uncomplicated: Secondary | ICD-10-CM | POA: Diagnosis present

## 2015-11-09 DIAGNOSIS — N80129 Deep endometriosis of ovary, unspecified ovary: Secondary | ICD-10-CM | POA: Diagnosis present

## 2015-11-09 DIAGNOSIS — I10 Essential (primary) hypertension: Secondary | ICD-10-CM | POA: Diagnosis present

## 2015-11-09 DIAGNOSIS — Z8249 Family history of ischemic heart disease and other diseases of the circulatory system: Secondary | ICD-10-CM

## 2015-11-09 DIAGNOSIS — N72 Inflammatory disease of cervix uteri: Secondary | ICD-10-CM

## 2015-11-09 DIAGNOSIS — N736 Female pelvic peritoneal adhesions (postinfective): Secondary | ICD-10-CM

## 2015-11-09 DIAGNOSIS — N801 Endometriosis of ovary: Secondary | ICD-10-CM | POA: Diagnosis present

## 2015-11-09 DIAGNOSIS — Z87442 Personal history of urinary calculi: Secondary | ICD-10-CM

## 2015-11-09 DIAGNOSIS — Z90710 Acquired absence of both cervix and uterus: Secondary | ICD-10-CM

## 2015-11-09 DIAGNOSIS — Z825 Family history of asthma and other chronic lower respiratory diseases: Secondary | ICD-10-CM

## 2015-11-09 DIAGNOSIS — N800 Endometriosis of uterus: Secondary | ICD-10-CM | POA: Diagnosis not present

## 2015-11-09 DIAGNOSIS — J449 Chronic obstructive pulmonary disease, unspecified: Secondary | ICD-10-CM | POA: Diagnosis present

## 2015-11-09 DIAGNOSIS — Z90722 Acquired absence of ovaries, bilateral: Secondary | ICD-10-CM

## 2015-11-09 DIAGNOSIS — Z9079 Acquired absence of other genital organ(s): Secondary | ICD-10-CM

## 2015-11-09 HISTORY — PX: ABDOMINAL HYSTERECTOMY: SHX81

## 2015-11-09 HISTORY — PX: SALPINGOOPHORECTOMY: SHX82

## 2015-11-09 LAB — HCG, SERUM, QUALITATIVE: PREG SERUM: NEGATIVE

## 2015-11-09 SURGERY — HYSTERECTOMY, ABDOMINAL
Anesthesia: General | Site: Abdomen

## 2015-11-09 MED ORDER — BUPIVACAINE HCL (PF) 0.5 % IJ SOLN
INTRAMUSCULAR | Status: AC
  Filled 2015-11-09: qty 30

## 2015-11-09 MED ORDER — KETOROLAC TROMETHAMINE 30 MG/ML IJ SOLN
30.0000 mg | Freq: Four times a day (QID) | INTRAMUSCULAR | Status: DC
  Administered 2015-11-09 – 2015-11-10 (×3): 30 mg via INTRAVENOUS
  Filled 2015-11-09 (×2): qty 1

## 2015-11-09 MED ORDER — LIDOCAINE HCL (CARDIAC) 20 MG/ML IV SOLN
INTRAVENOUS | Status: DC | PRN
  Administered 2015-11-09: 50 mg via INTRAVENOUS

## 2015-11-09 MED ORDER — 0.9 % SODIUM CHLORIDE (POUR BTL) OPTIME
TOPICAL | Status: DC | PRN
  Administered 2015-11-09 (×3): 1000 mL

## 2015-11-09 MED ORDER — SUFENTANIL CITRATE 50 MCG/ML IV SOLN
INTRAVENOUS | Status: AC
  Filled 2015-11-09: qty 1

## 2015-11-09 MED ORDER — HYDROMORPHONE 1 MG/ML IV SOLN
INTRAVENOUS | Status: DC
  Administered 2015-11-09: 1 mg via INTRAVENOUS

## 2015-11-09 MED ORDER — SODIUM CHLORIDE 0.9 % IV SOLN
INTRAVENOUS | Status: DC
  Administered 2015-11-09 – 2015-11-10 (×2): via INTRAVENOUS

## 2015-11-09 MED ORDER — KETOROLAC TROMETHAMINE 30 MG/ML IJ SOLN
INTRAMUSCULAR | Status: AC
  Filled 2015-11-09: qty 1

## 2015-11-09 MED ORDER — OXYCODONE-ACETAMINOPHEN 5-325 MG PO TABS: 1.0000 | ORAL_TABLET | ORAL | Status: DC | PRN

## 2015-11-09 MED ORDER — ONDANSETRON HCL 4 MG/2ML IJ SOLN
INTRAMUSCULAR | Status: AC
Start: 2015-11-09 — End: 2015-11-09
  Filled 2015-11-09: qty 2

## 2015-11-09 MED ORDER — BUDESONIDE 0.25 MG/2ML IN SUSP
0.2500 mg | Freq: Two times a day (BID) | RESPIRATORY_TRACT | Status: DC
  Administered 2015-11-09 – 2015-11-10 (×2): 0.25 mg via RESPIRATORY_TRACT
  Filled 2015-11-09 (×2): qty 2

## 2015-11-09 MED ORDER — ONDANSETRON HCL 4 MG/2ML IJ SOLN
INTRAMUSCULAR | Status: DC | PRN
  Administered 2015-11-09: 4 mg via INTRAVENOUS

## 2015-11-09 MED ORDER — ROCURONIUM BROMIDE 100 MG/10ML IV SOLN
INTRAVENOUS | Status: DC | PRN
  Administered 2015-11-09: 35 mg via INTRAVENOUS

## 2015-11-09 MED ORDER — PROPOFOL 10 MG/ML IV BOLUS
INTRAVENOUS | Status: AC
Start: 2015-11-09 — End: 2015-11-09
  Filled 2015-11-09: qty 20

## 2015-11-09 MED ORDER — ONDANSETRON HCL 4 MG/2ML IJ SOLN
4.0000 mg | Freq: Once | INTRAMUSCULAR | Status: AC
  Administered 2015-11-09: 4 mg via INTRAVENOUS

## 2015-11-09 MED ORDER — PANTOPRAZOLE SODIUM 40 MG PO TBEC
40.0000 mg | DELAYED_RELEASE_TABLET | Freq: Every day | ORAL | Status: DC
  Filled 2015-11-09: qty 1

## 2015-11-09 MED ORDER — SUFENTANIL CITRATE 50 MCG/ML IV SOLN
INTRAVENOUS | Status: DC | PRN
  Administered 2015-11-09: 5 ug via INTRAVENOUS
  Administered 2015-11-09: 10 ug via INTRAVENOUS
  Administered 2015-11-09: 5 ug via INTRAVENOUS
  Administered 2015-11-09 (×3): 10 ug via INTRAVENOUS

## 2015-11-09 MED ORDER — CEFAZOLIN SODIUM-DEXTROSE 2-4 GM/100ML-% IV SOLN
2.0000 g | INTRAVENOUS | Status: AC
  Administered 2015-11-09: 2 g via INTRAVENOUS
  Filled 2015-11-09: qty 100

## 2015-11-09 MED ORDER — LACTATED RINGERS IV SOLN
INTRAVENOUS | Status: DC
  Administered 2015-11-09: 1000 mL via INTRAVENOUS
  Administered 2015-11-09 (×2): via INTRAVENOUS

## 2015-11-09 MED ORDER — ALBUTEROL SULFATE (2.5 MG/3ML) 0.083% IN NEBU: 2.5000 mg | INHALATION_SOLUTION | Freq: Four times a day (QID) | RESPIRATORY_TRACT | Status: DC | PRN

## 2015-11-09 MED ORDER — BUPIVACAINE HCL (PF) 0.5 % IJ SOLN
INTRAMUSCULAR | Status: DC | PRN
  Administered 2015-11-09: 15 mL

## 2015-11-09 MED ORDER — DIPHENHYDRAMINE HCL 12.5 MG/5ML PO ELIX
12.5000 mg | ORAL_SOLUTION | Freq: Four times a day (QID) | ORAL | Status: DC | PRN
  Filled 2015-11-09: qty 5

## 2015-11-09 MED ORDER — MIDAZOLAM HCL 2 MG/2ML IJ SOLN
INTRAMUSCULAR | Status: AC
  Filled 2015-11-09: qty 2

## 2015-11-09 MED ORDER — NEOSTIGMINE METHYLSULFATE 10 MG/10ML IV SOLN
INTRAVENOUS | Status: AC
  Filled 2015-11-09: qty 1

## 2015-11-09 MED ORDER — MIDAZOLAM HCL 2 MG/2ML IJ SOLN
1.0000 mg | INTRAMUSCULAR | Status: DC | PRN
  Administered 2015-11-09 (×3): 2 mg via INTRAVENOUS
  Filled 2015-11-09 (×2): qty 2

## 2015-11-09 MED ORDER — LIDOCAINE HCL (PF) 1 % IJ SOLN
INTRAMUSCULAR | Status: AC
  Filled 2015-11-09: qty 5

## 2015-11-09 MED ORDER — PROPOFOL 10 MG/ML IV BOLUS
INTRAVENOUS | Status: DC | PRN
  Administered 2015-11-09: 120 mg via INTRAVENOUS

## 2015-11-09 MED ORDER — LISINOPRIL 10 MG PO TABS
10.0000 mg | ORAL_TABLET | Freq: Every day | ORAL | Status: DC
  Filled 2015-11-09: qty 1

## 2015-11-09 MED ORDER — HYDROMORPHONE 1 MG/ML IV SOLN
INTRAVENOUS | Status: AC
  Filled 2015-11-09: qty 25

## 2015-11-09 MED ORDER — GLYCOPYRROLATE 0.2 MG/ML IJ SOLN
INTRAMUSCULAR | Status: DC | PRN
  Administered 2015-11-09: 0.6 mg via INTRAVENOUS

## 2015-11-09 MED ORDER — FENTANYL CITRATE (PF) 100 MCG/2ML IJ SOLN
25.0000 ug | INTRAMUSCULAR | Status: DC | PRN
  Administered 2015-11-09 (×2): 50 ug via INTRAVENOUS

## 2015-11-09 MED ORDER — GLYCOPYRROLATE 0.2 MG/ML IJ SOLN
INTRAMUSCULAR | Status: AC
  Filled 2015-11-09: qty 3

## 2015-11-09 MED ORDER — PNEUMOCOCCAL VAC POLYVALENT 25 MCG/0.5ML IJ INJ
0.5000 mL | INJECTION | INTRAMUSCULAR | Status: DC
  Filled 2015-11-09: qty 0.5

## 2015-11-09 MED ORDER — NICOTINE 21 MG/24HR TD PT24
21.0000 mg | MEDICATED_PATCH | Freq: Every day | TRANSDERMAL | Status: DC
  Administered 2015-11-09: 21 mg via TRANSDERMAL
  Filled 2015-11-09 (×2): qty 1

## 2015-11-09 MED ORDER — FENTANYL CITRATE (PF) 100 MCG/2ML IJ SOLN
INTRAMUSCULAR | Status: AC
  Filled 2015-11-09: qty 2

## 2015-11-09 MED ORDER — KETOROLAC TROMETHAMINE 30 MG/ML IJ SOLN
30.0000 mg | Freq: Four times a day (QID) | INTRAMUSCULAR | Status: DC
  Filled 2015-11-09: qty 1

## 2015-11-09 MED ORDER — IBUPROFEN 600 MG PO TABS: 600.0000 mg | ORAL_TABLET | Freq: Four times a day (QID) | ORAL | Status: DC | PRN

## 2015-11-09 MED ORDER — NEOSTIGMINE METHYLSULFATE 10 MG/10ML IV SOLN
INTRAVENOUS | Status: DC | PRN
  Administered 2015-11-09: 4 mg via INTRAVENOUS

## 2015-11-09 MED ORDER — ONDANSETRON HCL 4 MG/2ML IJ SOLN
4.0000 mg | Freq: Once | INTRAMUSCULAR | Status: DC | PRN
Start: 2015-11-09 — End: 2015-11-09

## 2015-11-09 MED ORDER — ONDANSETRON HCL 4 MG PO TABS: 4.0000 mg | ORAL_TABLET | Freq: Four times a day (QID) | ORAL | Status: DC | PRN

## 2015-11-09 MED ORDER — ONDANSETRON HCL 4 MG/2ML IJ SOLN
INTRAMUSCULAR | Status: AC
  Filled 2015-11-09: qty 2

## 2015-11-09 MED ORDER — TIOTROPIUM BROMIDE MONOHYDRATE 18 MCG IN CAPS
1.0000 | ORAL_CAPSULE | Freq: Every day | RESPIRATORY_TRACT | Status: DC
  Administered 2015-11-10: 18 ug via RESPIRATORY_TRACT
  Filled 2015-11-09: qty 5

## 2015-11-09 MED ORDER — ONDANSETRON HCL 4 MG/2ML IJ SOLN
4.0000 mg | Freq: Four times a day (QID) | INTRAMUSCULAR | Status: DC | PRN
  Administered 2015-11-09: 4 mg via INTRAVENOUS

## 2015-11-09 MED ORDER — NALOXONE HCL 0.4 MG/ML IJ SOLN: 0.4000 mg | INTRAMUSCULAR | Status: DC | PRN

## 2015-11-09 MED ORDER — KETOROLAC TROMETHAMINE 30 MG/ML IJ SOLN
30.0000 mg | Freq: Once | INTRAMUSCULAR | Status: AC
  Administered 2015-11-09: 30 mg via INTRAVENOUS

## 2015-11-09 MED ORDER — ROCURONIUM BROMIDE 50 MG/5ML IV SOLN
INTRAVENOUS | Status: AC
  Filled 2015-11-09: qty 1

## 2015-11-09 MED ORDER — DIPHENHYDRAMINE HCL 50 MG/ML IJ SOLN: 12.5000 mg | Freq: Four times a day (QID) | INTRAMUSCULAR | Status: DC | PRN

## 2015-11-09 MED ORDER — ONDANSETRON HCL 4 MG/2ML IJ SOLN: 4.0000 mg | Freq: Four times a day (QID) | INTRAMUSCULAR | Status: DC | PRN

## 2015-11-09 MED ORDER — SODIUM CHLORIDE 0.9% FLUSH: 9.0000 mL | INTRAVENOUS | Status: DC | PRN

## 2015-11-09 SURGICAL SUPPLY — 56 items
APPLIER CLIP 11 MED OPEN (CLIP)
APPLIER CLIP 13 LRG OPEN (CLIP)
BAG HAMPER (MISCELLANEOUS) ×4 IMPLANT
BENZOIN TINCTURE PRP APPL 2/3 (GAUZE/BANDAGES/DRESSINGS) ×4 IMPLANT
CLIP APPLIE 11 MED OPEN (CLIP) IMPLANT
CLIP APPLIE 13 LRG OPEN (CLIP) IMPLANT
CLOSURE WOUND 1/2 X4 (GAUZE/BANDAGES/DRESSINGS) ×1
CLOTH BEACON ORANGE TIMEOUT ST (SAFETY) ×4 IMPLANT
COVER LIGHT HANDLE STERIS (MISCELLANEOUS) ×8 IMPLANT
DRAPE WARM FLUID 44X44 (DRAPE) ×4 IMPLANT
DRESSING TELFA 8X3 (GAUZE/BANDAGES/DRESSINGS) ×4 IMPLANT
DRSG OPSITE POSTOP 4X10 (GAUZE/BANDAGES/DRESSINGS) ×4 IMPLANT
DRSG OPSITE POSTOP 4X8 (GAUZE/BANDAGES/DRESSINGS) IMPLANT
DURAPREP 26ML APPLICATOR (WOUND CARE) ×4 IMPLANT
ELECT REM PT RETURN 9FT ADLT (ELECTROSURGICAL) ×4
ELECTRODE REM PT RTRN 9FT ADLT (ELECTROSURGICAL) ×2 IMPLANT
EVACUATOR DRAINAGE 10X20 100CC (DRAIN) IMPLANT
EVACUATOR SILICONE 100CC (DRAIN)
FORMALIN 10 PREFIL 480ML (MISCELLANEOUS) ×4 IMPLANT
GLOVE BIOGEL PI IND STRL 6.5 (GLOVE) ×2 IMPLANT
GLOVE BIOGEL PI IND STRL 7.0 (GLOVE) ×6 IMPLANT
GLOVE BIOGEL PI IND STRL 9 (GLOVE) ×4 IMPLANT
GLOVE BIOGEL PI INDICATOR 6.5 (GLOVE) ×2
GLOVE BIOGEL PI INDICATOR 7.0 (GLOVE) ×6
GLOVE BIOGEL PI INDICATOR 9 (GLOVE) ×4
GLOVE ECLIPSE 6.5 STRL STRAW (GLOVE) ×8 IMPLANT
GLOVE ECLIPSE 9.0 STRL (GLOVE) ×8 IMPLANT
GLOVE EXAM NITRILE MD LF STRL (GLOVE) ×12 IMPLANT
GOWN SPEC L3 XXLG W/TWL (GOWN DISPOSABLE) ×4 IMPLANT
GOWN STRL REUS W/TWL LRG LVL3 (GOWN DISPOSABLE) ×20 IMPLANT
INST SET MAJOR GENERAL (KITS) ×4 IMPLANT
KIT BLADEGUARD II DBL (SET/KITS/TRAYS/PACK) ×4 IMPLANT
KIT ROOM TURNOVER APOR (KITS) ×4 IMPLANT
MANIFOLD NEPTUNE II (INSTRUMENTS) ×4 IMPLANT
NEEDLE HYPO 25X1 1.5 SAFETY (NEEDLE) ×4 IMPLANT
NS IRRIG 1000ML POUR BTL (IV SOLUTION) ×12 IMPLANT
PACK ABDOMINAL MAJOR (CUSTOM PROCEDURE TRAY) ×4 IMPLANT
PAD ARMBOARD 7.5X6 YLW CONV (MISCELLANEOUS) ×4 IMPLANT
SET BASIN LINEN APH (SET/KITS/TRAYS/PACK) ×4 IMPLANT
SPONGE DRAIN TRACH 4X4 STRL 2S (GAUZE/BANDAGES/DRESSINGS) IMPLANT
SPONGE INTESTINAL PEANUT (DISPOSABLE) ×8 IMPLANT
SPONGE LAP 18X18 X RAY DECT (DISPOSABLE) ×4 IMPLANT
STAPLER VISISTAT 35W (STAPLE) ×4 IMPLANT
STRIP CLOSURE SKIN 1/2X4 (GAUZE/BANDAGES/DRESSINGS) ×3 IMPLANT
SUT CHROMIC 0 CT 1 (SUTURE) ×52 IMPLANT
SUT CHROMIC 2 0 CT 1 (SUTURE) ×8 IMPLANT
SUT PDS AB CT VIOLET #0 27IN (SUTURE) ×8 IMPLANT
SUT PLAIN CT 1/2CIR 2-0 27IN (SUTURE) ×12 IMPLANT
SUT VIC AB 0 CT1 27 (SUTURE) ×2
SUT VIC AB 0 CT1 27XBRD ANTBC (SUTURE) ×2 IMPLANT
SUT VIC AB 2-0 CT1 27 (SUTURE)
SUT VIC AB 2-0 CT1 TAPERPNT 27 (SUTURE) IMPLANT
SUT VICRYL 3 0 (SUTURE) ×4 IMPLANT
SYR CONTROL 10ML LL (SYRINGE) ×4 IMPLANT
TOWEL BLUE STERILE X RAY DET (MISCELLANEOUS) ×4 IMPLANT
TRAY FOLEY CATH SILVER 16FR (SET/KITS/TRAYS/PACK) ×4 IMPLANT

## 2015-11-09 NOTE — Brief Op Note (Signed)
11/09/2015  3:08 PM  PATIENT:  Norma [name]  40 y.o. female  PRE-OPERATIVE DIAGNOSIS:  pelvic mass  POST-OPERATIVE DIAGNOSIS:  endometriosis with endometrioma  PROCEDURE:  Procedure(s): HYSTERECTOMY ABDOMINAL (N/A) SALPINGO OOPHORECTOMY (Bilateral)  SURGEON:  Surgeon(s) and Role:    * Tilda BurrowJohn Keyry Iracheta V, MD - Primary  PHYSICIAN ASSISTANT:   ASSISTANTS: Catherine page   ANESTHESIA:   local and general  EBL:  Total I/O In: 2600 [I.V.:2600] Out: 300 [Blood:300]  BLOOD ADMINISTERED:none  DRAINS: Urinary Catheter (Foley)   LOCAL MEDICATIONS USED:  MARCAINE    and Amount: 15 ml  SPECIMEN:  Source of Specimen:  with cervix tubes and ovaries with endometriomas  DISPOSITION OF SPECIMEN:  PATHOLOGY  COUNTS:  YES  TOURNIQUET:  * No tourniquets in log *  DICTATION: .Dragon Dictation  PLAN OF CARE: Admit to inpatient   PATIENT DISPOSITION:  PACU - hemodynamically stable.   Delay start of Pharmacological VTE agent (>24hrs) due to surgical blood loss or risk of bleeding: not applicable Details of procedure patient was taken operating room prepped and draped for lower abdominal surgery. Midline vertical incision was made from the umbilicus to the suprapubic area with sharp dissection down to the peritoneal cavity which was carefully entered and the bowel was packed away with Balfour retractor in place inspected. There was dense area of cystic structure with a adherent pelvis. Manual were made to mobilize the ovaries. There was no suspicion of malignancy. As things mobilize the left ovary ruptured and was found to be filled with chocolate fluid consistent with a huge endometrioma left side had a smaller ovarian cyst consistent with endometrioma or functional cyst. A pelvis behind the uterus could be gradually dissected free and anatomy normalized the large amount of fluid was washed out of the pelvis and hysterectomy then proceeded with taking the round ligaments down with 0 chromic  suture ligature and transection, and then another the bladder flap anteriorly. IP ligaments could be identified on either side the ureters confirmed as being well out of the way and then the IP ligament clamped cut and suture ligated well away from the pelvic sidewall. 0 chromic suture was used except as noted elsewhere. The uterine vessels were isolated clamped cut and suture ligated on each side, and then upper cardinal ligaments clamped cut and suture ligated using 0 chromic. Upon reaching the level of the cervix a stab incision in the anterior cervicovaginal fornix was performed and the cervix amputated off the vaginal cuff. Aldridge stitch was placed at each lateral vaginal angle the cuff closed with a series of interrupted figure-of-eight sutures and good hemostasis and pelvic support. Developed.  The cuff was closed completely the pelvis irrigated, and equipment removed. Sponge and needle counts were correct. The anterior peritoneum was closed with 2-0 chromic, the fascia closed with running PDS 0 and then subcutaneous tissues reapproximated with interrupted 20 plain and then subcuticular 4-0 Vicryl close the skin sponge and needle counts correct

## 2015-11-09 NOTE — H&P (Signed)
Tilda Burrow, MD at 11/02/2015 10:07 AM     Status: Signed       Expand All Collapse All   Patient ID: Norma [name], female DOB: 1976-03-15, 40 y.o. MRN: [id number]  Preoperative History and Physical  Norma [name] is a 40 y.o. No obstetric history on file. here for surgical management of pelvic mass. Patient has been scheduled for the surgery twice before but the surgery was canceled both times due to patient's elevated blood pressure. Patient has been controlling her blood pressure with HCTZ 25 and lisinopril 10.No significant preoperative concerns.  Proposed surgery: Abdominal hysterectomy with bilateral salpingo oophorectomy  Past Medical History  Diagnosis Date  . COPD (chronic obstructive pulmonary disease) (HCC)   . Hypertension   . IBS (irritable bowel syndrome)   . Anxiety   . History of kidney stones   . Depression    Past Surgical History  Procedure Laterality Date  . Dilation and curettage of uterus     OB History  No data available  Patient denies any other pertinent gynecologic issues.   Current Outpatient Prescriptions on File Prior to Visit  Medication Sig Dispense Refill  . albuterol (PROVENTIL HFA;VENTOLIN HFA) 108 (90 BASE) MCG/ACT inhaler Inhale 2 puffs into the lungs every 6 (six) hours as needed for wheezing.    Marland Kitchen albuterol (PROVENTIL) (2.5 MG/3ML) 0.083% nebulizer solution Take 2.5 mg by nebulization every 6 (six) hours as needed for wheezing.    Marland Kitchen HYDROcodone-acetaminophen (NORCO/VICODIN) 5-325 MG tablet Take 1 tablet by mouth every 6 (six) hours as needed for moderate pain. 45 tablet 0  . nicotine (NICODERM CQ) 21 mg/24hr patch Place 1 patch (21 mg total) onto the skin daily. 28 patch 2  . OXYGEN Inhale 2 L into the lungs daily.    . promethazine (PHENERGAN) 25 MG tablet Take 1 tablet (25 mg total) by mouth every 6 (six) hours as needed for nausea or vomiting. 30 tablet  1  . QVAR 80 MCG/ACT inhaler Inhale 2 puffs into the lungs daily.  5  . SPIRIVA HANDIHALER 18 MCG inhalation capsule Place 1 capsule into inhaler and inhale daily.  5   No current facility-administered medications on file prior to visit.   No Known Allergies  Social History:  reports that she has been smoking Cigarettes. She has a 19 pack-year smoking history. She has never used smokeless tobacco. She reports that she does not drink alcohol or use illicit drugs.  Family History  Problem Relation Age of Onset  . COPD Mother   . Diabetes Father   . Hypertension Father   . Heart disease Sister   . Heart disease Maternal Aunt   . Birth defects Daughter   . Kidney disease Daughter     Review of Systems: Noncontributory  PHYSICAL EXAM: Blood pressure 110/72, height  (1.575 m), last menstrual period 09/24/2015. General appearance - alert, well appearing, and in no distress Chest - clear to auscultation, no wheezes, rales or rhonchi, symmetric air entry Heart - normal rate and regular rhythm Abdomen - soft, nontender, nondistended, no masses or organomegaly Pelvic - examination  VULVA: 10 vulvar skin tags  VAGINA: normal appearing vagina with normal color and discharge, no lesions,  PELVIC FLOOR EXAM: no cystocele, rectocele or prolapse noted,  CERVIX: normal appearing cervix without discharge or lesions, well-supported,  UTERUS: uterus is normal size, shape, consistency and nontender, anteverted, mobile, pushed markedly forward ADNEXA: mass present right, and cul de sac side, size 11  cm Extremities - peripheral pulses normal, no pedal edema, no clubbing or cyanosis  Labs:  CBC Latest Ref Rng 11/07/2015 10/20/2015 10/05/2015  WBC 4.0 - 10.5 K/uL 10.0 7.0 6.6  Hemoglobin 12.0 - 15.0 g/dL 09.812.6 11.5(L) 11.7(L)  Hematocrit 36.0 - 46.0 % 38.2 35.1(L) 35.8(L)  Platelets 150 - 400 K/uL 317 327 365    BMET    Component Value  Date/Time   NA 137 11/07/2015 1500   K 3.6 11/07/2015 1500   CL 101 11/07/2015 1500   CO2 26 11/07/2015 1500   GLUCOSE 97 11/07/2015 1500   BUN 13 11/07/2015 1500   CREATININE 0.67 11/07/2015 1500   CALCIUM 9.4 11/07/2015 1500   GFRNONAA >60 11/07/2015 1500   GFRAA >60 11/07/2015 1500      Results for orders placed or performed during the [name] encounter of 10/20/15 (from the past 336 hour(s))  CBC with Differential/Platelet   Collection Time: 10/20/15 2:49 PM  Result Value Ref Range   WBC 7.0 4.0 - 10.5 K/uL   RBC 3.94 3.87 - 5.11 MIL/uL   Hemoglobin 11.5 (L) 12.0 - 15.0 g/dL   HCT 11.935.1 (L) 14.736.0 - 82.946.0 %   MCV 89.1 78.0 - 100.0 fL   MCH 29.2 26.0 - 34.0 pg   MCHC 32.8 30.0 - 36.0 g/dL   RDW 56.215.9 (H) 13.011.5 - 86.515.5 %   Platelets 327 150 - 400 K/uL   Neutrophils Relative % 57 %   Neutro Abs 4.0 1.7 - 7.7 K/uL   Lymphocytes Relative 31 %   Lymphs Abs 2.2 0.7 - 4.0 K/uL   Monocytes Relative 8 %   Monocytes Absolute 0.6 0.1 - 1.0 K/uL   Eosinophils Relative 3 %   Eosinophils Absolute 0.2 0.0 - 0.7 K/uL   Basophils Relative 1 %   Basophils Absolute 0.1 0.0 - 0.1 K/uL  Basic metabolic panel   Collection Time: 10/20/15 2:49 PM  Result Value Ref Range   Sodium 139 135 - 145 mmol/L   Potassium 4.4 3.5 - 5.1 mmol/L   Chloride 107 101 - 111 mmol/L   CO2 25 22 - 32 mmol/L   Glucose, Bld 87 65 - 99 mg/dL   BUN 14 6 - 20 mg/dL   Creatinine, Ser 7.840.57 0.44 - 1.00 mg/dL   Calcium 8.9 8.9 - 69.610.3 mg/dL   GFR calc non Af Amer >60 >60 mL/min   GFR calc Af Amer >60 >60 mL/min   Anion gap 7 5 - 15  hCG, serum, qualitative   Collection Time: 10/20/15 2:49 PM  Result Value Ref Range   Preg, Serum NEGATIVE NEGATIVE  Type and screen [name]   Collection Time: 10/20/15 2:49 PM  Result Value Ref Range   ABO/RH(D) O NEG     Antibody Screen NEG    Sample Expiration 11/03/2015    Extend sample reason NO TRANSFUSIONS OR PREGNANCY IN THE PAST 3 MONTHS     Imaging Studies:  Imaging Results    Dg Chest 2 View  10/20/2015 CLINICAL DATA: Sarcoidosis. Patient preop for hysterectomy. Current smoker. EXAM: CHEST 2 VIEW COMPARISON: 02/13/2010 FINDINGS: Cardiac silhouette is normal in size and configuration. Are more mediastinal and hilar contours. Clear lungs. No pleural effusion or pneumothorax Bony thorax unremarkable. IMPRESSION: No active cardiopulmonary disease. Electronically Signed By: Amie Portlandavid Ormond M.D. On: 10/20/2015 16:04     Assessment: Patient Active Problem List   Diagnosis Date Noted  . Rectal fissure 10/04/2015  . Pelvic mass 09/20/2015  Plan: Patient will undergo surgical management with abdominal hysterectomy with bilateral salpingo oophorectomy.   Tilda Burrow, MD  .mec 11/02/2015 10:07 AM   By signing my name below, I, Ronney Lion, attest that this documentation has been prepared under the direction and in the presence of Tilda Burrow, MD. Electronically Signed: Ronney Lion, ED Scribe. 11/02/2015. 10:19 AM.  I personally performed the services described in this documentation, which was SCRIBED in my presence. The recorded information has been reviewed and considered accurate. It has been edited as necessary during review. Tilda Burrow, MD

## 2015-11-09 NOTE — Anesthesia Postprocedure Evaluation (Signed)
Anesthesia Post Note  Patient: Norma [name]  Procedure(s) Performed: Procedure(s) (LRB): HYSTERECTOMY ABDOMINAL (N/A) SALPINGO OOPHORECTOMY (Bilateral)  Patient location during evaluation: PACU Anesthesia Type: General Level of consciousness: awake and alert Pain control: 8/10  PCA started. Vital Signs Assessment: post-procedure vital signs reviewed and stable Respiratory status: spontaneous breathing and non-rebreather facemask Cardiovascular status: stable Anesthetic complications: no    Last Vitals:  Filed Vitals:   11/09/15 1545 11/09/15 1548  BP:    Pulse: 80   Temp:    Resp: 21 22    Last Pain:  Filed Vitals:   11/09/15 1551  PainSc: 8                  Raif Chachere

## 2015-11-09 NOTE — Anesthesia Preprocedure Evaluation (Signed)
Anesthesia Evaluation  Patient identified by MRN, date of birth, ID band Patient awake    Reviewed: Allergy & Precautions, NPO status , Patient's Chart, lab work & pertinent test results  Airway Mallampati: II  TM Distance: >3 FB     Dental  (+) Teeth Intact, Dental Advisory Given   Pulmonary COPD, Current Smoker,    breath sounds clear to auscultation       Cardiovascular hypertension, Pt. on medications  Rhythm:Regular Rate:Normal     Neuro/Psych PSYCHIATRIC DISORDERS Anxiety Depression    GI/Hepatic GERD (occasional)  ,  Endo/Other    Renal/GU      Musculoskeletal   Abdominal   Peds  Hematology   Anesthesia Other Findings   Reproductive/Obstetrics                             Anesthesia Physical Anesthesia Plan  ASA: III  Anesthesia Plan: General   Post-op Pain Management:    Induction: Intravenous  Airway Management Planned: Oral ETT  Additional Equipment:   Intra-op Plan:   Post-operative Plan: Extubation in OR  Informed Consent: I have reviewed the patients History and Physical, chart, labs and discussed the procedure including the risks, benefits and alternatives for the proposed anesthesia with the patient or authorized representative who has indicated his/her understanding and acceptance.     Plan Discussed with:   Anesthesia Plan Comments:         Anesthesia Quick Evaluation

## 2015-11-09 NOTE — Anesthesia Procedure Notes (Signed)
Procedure Name: Intubation Date/Time: 11/09/2015 1:02 PM Performed by: Pernell DupreADAMS, AMY A Pre-anesthesia Checklist: Patient identified, Patient being monitored, Timeout performed, Emergency Drugs available and Suction available Patient Re-evaluated:Patient Re-evaluated prior to inductionOxygen Delivery Method: Circle System Utilized Preoxygenation: Pre-oxygenation with 100% oxygen Intubation Type: IV induction Ventilation: Mask ventilation without difficulty Laryngoscope Size: 3 and Miller Grade View: Grade I Tube type: Oral Tube size: 7.0 mm Number of attempts: 1 Airway Equipment and Method: Stylet Placement Confirmation: ETT inserted through vocal cords under direct vision,  positive ETCO2 and breath sounds checked- equal and bilateral Secured at: 21 cm Tube secured with: Tape Dental Injury: Teeth and Oropharynx as per pre-operative assessment

## 2015-11-09 NOTE — Transfer of Care (Signed)
Immediate Anesthesia Transfer of Care Note  Patient: Josefine ClassJennifer L Hageman  Procedure(s) Performed: Procedure(s): HYSTERECTOMY ABDOMINAL (N/A) SALPINGO OOPHORECTOMY (Bilateral)  Patient Location: PACU  Anesthesia Type:General  Level of Consciousness: awake and patient cooperative  Airway & Oxygen Therapy: Patient Spontanous Breathing and non-rebreather face mask  Post-op Assessment: Report given to RN, Post -op Vital signs reviewed and stable and Patient moving all extremities  Post vital signs: Reviewed and stable   Last Pain: There were no vitals filed for this visit.    Patients Stated Pain Goal: 8 (11/09/15 1009)  Complications: No apparent anesthesia complications

## 2015-11-10 ENCOUNTER — Encounter (HOSPITAL_COMMUNITY): Payer: Self-pay | Admitting: Obstetrics and Gynecology

## 2015-11-10 DIAGNOSIS — N801 Endometriosis of ovary: Secondary | ICD-10-CM | POA: Diagnosis present

## 2015-11-10 DIAGNOSIS — N80129 Deep endometriosis of ovary, unspecified ovary: Secondary | ICD-10-CM | POA: Diagnosis present

## 2015-11-10 LAB — CBC
HCT: 30.2 % — ABNORMAL LOW (ref 36.0–46.0)
Hemoglobin: 9.8 g/dL — ABNORMAL LOW (ref 12.0–15.0)
MCH: 28.7 pg (ref 26.0–34.0)
MCHC: 32.5 g/dL (ref 30.0–36.0)
MCV: 88.3 fL (ref 78.0–100.0)
PLATELETS: 243 10*3/uL (ref 150–400)
RBC: 3.42 MIL/uL — AB (ref 3.87–5.11)
RDW: 15.9 % — AB (ref 11.5–15.5)
WBC: 9.1 10*3/uL (ref 4.0–10.5)

## 2015-11-10 LAB — BASIC METABOLIC PANEL
Anion gap: 6 (ref 5–15)
BUN: 15 mg/dL (ref 6–20)
CALCIUM: 8.2 mg/dL — AB (ref 8.9–10.3)
CO2: 27 mmol/L (ref 22–32)
CREATININE: 0.59 mg/dL (ref 0.44–1.00)
Chloride: 104 mmol/L (ref 101–111)
GFR calc Af Amer: >60 mL/min (ref >60)
GLUCOSE: 91 mg/dL (ref 65–99)
POTASSIUM: 3.5 mmol/L (ref 3.5–5.1)
SODIUM: 137 mmol/L (ref 135–145)

## 2015-11-10 MED ORDER — OXYCODONE-ACETAMINOPHEN 5-325 MG PO TABS: 1.0000 | ORAL_TABLET | ORAL | Status: DC | PRN

## 2015-11-10 MED ORDER — KETOROLAC TROMETHAMINE 10 MG PO TABS: 10.0000 mg | ORAL_TABLET | Freq: Four times a day (QID) | ORAL | Status: DC | PRN

## 2015-11-10 MED ORDER — ESTRADIOL 1 MG PO TABS: 1.0000 mg | ORAL_TABLET | Freq: Every day | ORAL | Status: DC

## 2015-11-10 NOTE — Anesthesia Postprocedure Evaluation (Signed)
Anesthesia Post Note  Patient: Norma [name]  Procedure(s) Performed: Procedure(s) (LRB): HYSTERECTOMY ABDOMINAL (N/A) SALPINGO OOPHORECTOMY (Bilateral)  Patient location during evaluation: Nursing Unit Anesthesia Type: General Level of consciousness: awake and alert, oriented and patient cooperative Pain management: pain level controlled Vital Signs Assessment: post-procedure vital signs reviewed and stable Respiratory status: spontaneous breathing, nonlabored ventilation and respiratory function stable Cardiovascular status: blood pressure returned to baseline Postop Assessment: no signs of nausea or vomiting and adequate PO intake Anesthetic complications: no    Last Vitals:  Filed Vitals:   11/10/15 0400 11/10/15 0544  BP:  118/72  Pulse:  86  Temp:  36.7 C  Resp: 16 20    Last Pain:  Filed Vitals:   11/10/15 0754  PainSc: Asleep                 Kunal Levario J

## 2015-11-10 NOTE — Discharge Instructions (Signed)
Exploratory Laparotomy, Adult, Care After °Refer to this sheet in the next few weeks. These instructions provide you with information about caring for yourself after your procedure. Your health care provider may also give you more specific instructions. Your treatment has been planned according to current medical practices, but problems sometimes occur. Call your health care provider if you have any problems or questions after your procedure. °WHAT TO EXPECT AFTER THE PROCEDURE °After your procedure, it is typical to have: °· Abdominal soreness. °· Fatigue. °· A sore throat from tubes in your throat. °· A lack of appetite. °HOME CARE INSTRUCTIONS °Medicines °· Take medicines only as directed by your health care provider. °· Do not drive or operate heavy machinery while taking pain medicine. °Incision Care °· There are many different ways to close and cover an incision, including stitches (sutures), skin glue, and adhesive strips. Follow your health care provider's instructions about: °¨ Incision care. °¨ Bandage (dressing) changes and removal. °¨ Incision closure removal. °· Do not take showers or baths until your health care provider says that you can. °· Check your incision area daily for signs of infection. Watch for: °¨ Redness. °¨ Tenderness. °¨ Swelling. °¨ Drainage. °Activities °· Do not lift anything that is heavier than 10 pounds (4.5 kg) until your health care provider says that it is safe. °· Try to walk a little bit each day if your health care provider says that it is okay. °· Ask your health care provider when you can start to do your usual activities again, such as driving, going back to work, and having sex. °Eating and Drinking °· You may eat what you usually eat. Include lots of whole grains, fruits, and vegetables in your diet. This will help to prevent constipation. °· Drink enough fluid to keep your urine clear or pale yellow. °General Instructions °· Keep all follow-up visits as directed by  your health care provider. This is important. °SEEK MEDICAL CARE IF:  °· You have a fever. °· You have chills. °· Your pain medicine is not helping. °· You have constipation or diarrhea. °· You have nausea or vomiting. °· You have drainage, redness, swelling, or pain at your incision site. °SEEK IMMEDIATE MEDICAL CARE IF: °· Your pain is getting worse. °· It has been more than 3 days since you been able to have a bowel movement. °· You have ongoing (persistent) vomiting. °· The edges of your incision open up. °· You have warmth, tenderness, and swelling in your calf. °· You have trouble breathing. °· You have chest pain. °  °This information is not intended to replace advice given to you by your health care provider. Make sure you discuss any questions you have with your health care provider. °  °Document Released: 02/13/2004 Document Revised: 07/22/2014 Document Reviewed: 02/16/2014 °Elsevier Interactive Patient Education ©2016 [employer] ° °

## 2015-11-10 NOTE — Addendum Note (Signed)
Addendum  created 11/10/15 [id number] by Despina Hiddenobert J Angeli Demilio, CRNA   Modules edited: Clinical Notes   Clinical Notes:  File: [id number]

## 2015-11-10 NOTE — Discharge Summary (Signed)
Physician Discharge Summary  Patient ID: Norma ClassJennifer L Virag MRN: [id number] DOB/AGE: 39/07/1975 39 y.o.  Admit date: 11/09/2015 Discharge date: 11/10/2015  Admission Diagnoses:Pelvic mass  Discharge Diagnoses: Pelvic endometrioma Active Problems:   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   Discharged Condition: good  Hospital Course: This 40 year old female underwent exploratory laparotomy with identification of large lower endometriomas and underwent hysterectomy with bilateral salpingo-oophorectomy and removal of cervix vision tolerated procedure well was hospitalized 1 day and discharged at 24 hours at her insistence to her desire to continue recovery at home  Consults: None  Significant Diagnostic Studies: labs:  CBC Latest Ref Rng 11/10/2015 11/07/2015 10/20/2015  WBC 4.0 - 10.5 K/uL 9.1 10.0 7.0  Hemoglobin 12.0 - 15.0 g/dL 1.3(Y9.8(L) 86.512.6 11.5(L)  Hematocrit 36.0 - 46.0 % 30.2(L) 38.2 35.1(L)  Platelets 150 - 400 K/uL 243 317 327    BMET    Component Value Date/Time   NA 137 11/10/2015 0548   K 3.5 11/10/2015 0548   CL 104 11/10/2015 0548   CO2 27 11/10/2015 0548   GLUCOSE 91 11/10/2015 0548   BUN 15 11/10/2015 0548   CREATININE 0.59 11/10/2015 0548   CALCIUM 8.2* 11/10/2015 0548   GFRNONAA >60 11/10/2015 0548   GFRAA >60 11/10/2015 0548      Treatments: surgery: total abdominal hysterectomy bilateral salpingoophorectomy  Discharge Exam: Blood pressure 118/72, pulse 86, temperature 98.1 F (36.7 C), temperature source Oral, resp. rate 20, height 5\' 2"  (1.575 m), weight 193 lb (87.544 kg), last menstrual period 09/24/2015, SpO2 99 %. General appearance: alert, cooperative and no distress Resp: clear to auscultation bilaterally GI: soft, non-tender; bowel sounds normal; no masses,  no organomegaly Incision/Wound: clean, slight ooze on lower 1/3, will change before d/c  Disposition: 01-Home or Self Care  Discharge Instructions     Remove dressing in 72 hours    Complete by:  As directed      Call MD for:  persistant nausea and vomiting    Complete by:  As directed      Call MD for:  severe uncontrolled pain    Complete by:  As directed      Call MD for:  temperature >100.4    Complete by:  As directed      Diet - low sodium heart healthy    Complete by:  As directed      Increase activity slowly    Complete by:  As directed             Medication List    TAKE these medications        albuterol (2.5 MG/3ML) 0.083% nebulizer solution  Commonly known as:  PROVENTIL  Take 2.5 mg by nebulization every 6 (six) hours as needed for wheezing.     albuterol 108 (90 Base) MCG/ACT inhaler  Commonly known as:  PROVENTIL HFA;VENTOLIN HFA  Inhale 2 puffs into the lungs every 6 (six) hours as needed for wheezing.     estradiol 1 MG tablet  Commonly known as:  ESTRACE  Take 1 tablet (1 mg total) by mouth daily.     hydrochlorothiazide 25 MG tablet  Commonly known as:  HYDRODIURIL  Take 25 mg by mouth daily.     HYDROcodone-acetaminophen 5-325 MG tablet  Commonly known as:  NORCO/VICODIN  Take 1 tablet by mouth every 6 (six) hours as needed for moderate pain.     ketorolac 10 MG tablet  Commonly known as:  TORADOL  Take 1 tablet (  10 mg total) by mouth every 6 (six) hours as needed (five day limit postop).     lisinopril 10 MG tablet  Commonly known as:  PRINIVIL,ZESTRIL  Take 10 mg by mouth daily.     nicotine 21 mg/24hr patch  Commonly known as:  NICODERM CQ  Place 1 patch (21 mg total) onto the skin daily.     oxyCODONE-acetaminophen 5-325 MG tablet  Commonly known as:  PERCOCET/ROXICET  Take 1-2 tablets by mouth every 4 (four) hours as needed for severe pain (moderate to severe pain (when tolerating fluids)).     OXYGEN  Inhale 2 L into the lungs daily.     promethazine 25 MG tablet  Commonly known as:  PHENERGAN  Take 1 tablet (25 mg total) by mouth every 6 (six) hours as needed for nausea or  vomiting.     QVAR 80 MCG/ACT inhaler  Generic drug:  beclomethasone  Inhale 2 puffs into the lungs daily.     SPIRIVA HANDIHALER 18 MCG inhalation capsule  Generic drug:  tiotropium  Place 1 capsule into inhaler and inhale daily.           Follow-up Information    Follow up with Tilda Burrow, MD In 1 week.   Specialties:  Obstetrics and Gynecology, Radiology   Why:  For wound re-check   Contact information:   [address] [place] [address] [phone]       Signed: Tilda Burrow 11/10/2015, 8:48 AM

## 2015-11-10 NOTE — Progress Notes (Signed)
Patient alert and oriented, independent, VSS, pt. Tolerating diet well. No complaints of pain or nausea. Pt. Had IV removed tip intact. Pt. Had prescriptions given. Pt. Voiced understanding of discharge instructions with no further questions. Pt. Discharged via wheelchair with auxilliary.  

## 2015-11-11 ENCOUNTER — Emergency Department (HOSPITAL_COMMUNITY)
Admission: EM | Admit: 2015-11-11 | Discharge: 2015-11-11 | Disposition: A | Discharge status: Home / Self Care | Payer: Medicaid Other | Attending: Emergency Medicine | Admitting: Emergency Medicine

## 2015-11-11 ENCOUNTER — Encounter (HOSPITAL_COMMUNITY): Payer: Self-pay | Admitting: Emergency Medicine

## 2015-11-11 ENCOUNTER — Emergency Department (HOSPITAL_COMMUNITY): Payer: Medicaid Other

## 2015-11-11 DIAGNOSIS — F17210 Nicotine dependence, cigarettes, uncomplicated: Secondary | ICD-10-CM | POA: Diagnosis not present

## 2015-11-11 DIAGNOSIS — R109 Unspecified abdominal pain: Secondary | ICD-10-CM | POA: Diagnosis not present

## 2015-11-11 DIAGNOSIS — F329 Major depressive disorder, single episode, unspecified: Secondary | ICD-10-CM | POA: Insufficient documentation

## 2015-11-11 DIAGNOSIS — J449 Chronic obstructive pulmonary disease, unspecified: Secondary | ICD-10-CM | POA: Diagnosis not present

## 2015-11-11 DIAGNOSIS — Z90710 Acquired absence of both cervix and uterus: Secondary | ICD-10-CM | POA: Insufficient documentation

## 2015-11-11 DIAGNOSIS — R1110 Vomiting, unspecified: Secondary | ICD-10-CM

## 2015-11-11 DIAGNOSIS — I10 Essential (primary) hypertension: Secondary | ICD-10-CM | POA: Diagnosis not present

## 2015-11-11 DIAGNOSIS — R112 Nausea with vomiting, unspecified: Secondary | ICD-10-CM | POA: Diagnosis not present

## 2015-11-11 DIAGNOSIS — Z79899 Other long term (current) drug therapy: Secondary | ICD-10-CM | POA: Diagnosis not present

## 2015-11-11 LAB — CBC WITH DIFFERENTIAL/PLATELET
Basophils Absolute: 0 10*3/uL (ref 0.0–0.1)
Basophils Relative: 0 %
EOS ABS: 0 10*3/uL (ref 0.0–0.7)
Eosinophils Relative: 0 %
HEMATOCRIT: 32.9 % — AB (ref 36.0–46.0)
HEMOGLOBIN: 11 g/dL — AB (ref 12.0–15.0)
LYMPHS ABS: 1.1 10*3/uL (ref 0.7–4.0)
Lymphocytes Relative: 9 %
MCH: 29.6 pg (ref 26.0–34.0)
MCHC: 33.4 g/dL (ref 30.0–36.0)
MCV: 88.7 fL (ref 78.0–100.0)
Monocytes Absolute: 0.7 10*3/uL (ref 0.1–1.0)
Monocytes Relative: 6 %
NEUTROS ABS: 10.1 10*3/uL — AB (ref 1.7–7.7)
NEUTROS PCT: 85 %
Platelets: 254 10*3/uL (ref 150–400)
RBC: 3.71 MIL/uL — AB (ref 3.87–5.11)
RDW: 15.6 % — ABNORMAL HIGH (ref 11.5–15.5)
WBC: 11.9 10*3/uL — AB (ref 4.0–10.5)

## 2015-11-11 LAB — BASIC METABOLIC PANEL
Anion gap: 8 (ref 5–15)
BUN: 7 mg/dL (ref 6–20)
CHLORIDE: 103 mmol/L (ref 101–111)
CO2: 26 mmol/L (ref 22–32)
Calcium: 8.6 mg/dL — ABNORMAL LOW (ref 8.9–10.3)
Creatinine, Ser: 0.58 mg/dL (ref 0.44–1.00)
GFR calc non Af Amer: >60 mL/min (ref >60)
Glucose, Bld: 99 mg/dL (ref 65–99)
POTASSIUM: 3.1 mmol/L — AB (ref 3.5–5.1)
SODIUM: 137 mmol/L (ref 135–145)

## 2015-11-11 LAB — URINALYSIS, ROUTINE W REFLEX MICROSCOPIC
Bilirubin Urine: NEGATIVE
Glucose, UA: NEGATIVE mg/dL
Ketones, ur: 40 mg/dL — AB
Leukocytes, UA: NEGATIVE
NITRITE: NEGATIVE
Protein, ur: NEGATIVE mg/dL
SPECIFIC GRAVITY, URINE: 1.01 (ref 1.005–1.030)
pH: 5.5 (ref 5.0–8.0)

## 2015-11-11 LAB — URINE MICROSCOPIC-ADD ON: WBC UA: NONE SEEN WBC/hpf (ref 0–5)

## 2015-11-11 MED ORDER — ONDANSETRON 4 MG PO TBDP: 4.0000 mg | ORAL_TABLET | Freq: Three times a day (TID) | ORAL | Status: DC | PRN

## 2015-11-11 MED ORDER — HYDROMORPHONE HCL 1 MG/ML IJ SOLN
0.5000 mg | Freq: Once | INTRAMUSCULAR | Status: AC
Start: 2015-11-11 — End: 2015-11-11
  Administered 2015-11-11: 0.5 mg via INTRAVENOUS
  Filled 2015-11-11: qty 1

## 2015-11-11 MED ORDER — IOPAMIDOL (ISOVUE-300) INJECTION 61%
100.0000 mL | Freq: Once | INTRAVENOUS | Status: AC | PRN
  Administered 2015-11-11: 100 mL via INTRAVENOUS

## 2015-11-11 MED ORDER — HYDROCODONE-ACETAMINOPHEN 5-325 MG PO TABS
2.0000 | ORAL_TABLET | ORAL | Status: AC
  Administered 2015-11-11: 2 via ORAL
  Filled 2015-11-11: qty 2

## 2015-11-11 MED ORDER — SODIUM CHLORIDE 0.9 % IV BOLUS (SEPSIS)
1000.0000 mL | Freq: Once | INTRAVENOUS | Status: AC
  Administered 2015-11-11: 1000 mL via INTRAVENOUS

## 2015-11-11 MED ORDER — SODIUM CHLORIDE 0.9 % IV SOLN
Freq: Once | INTRAVENOUS | Status: AC
  Administered 2015-11-11: 08:00:00 via INTRAVENOUS

## 2015-11-11 MED ORDER — ONDANSETRON HCL 4 MG/2ML IJ SOLN
4.0000 mg | Freq: Once | INTRAMUSCULAR | Status: AC
  Administered 2015-11-11: 4 mg via INTRAVENOUS
  Filled 2015-11-11: qty 2

## 2015-11-11 MED ORDER — HYDROCODONE-ACETAMINOPHEN 5-325 MG PO TABS: 2.0000 | ORAL_TABLET | ORAL | Status: DC | PRN

## 2015-11-11 NOTE — ED Notes (Signed)
Drinking sprite per patient request. Tolerating well.

## 2015-11-11 NOTE — ED Notes (Addendum)
Patient c/o steady pain in lower abd with nausea and vomiting. Per patient had hysterectomy on Thursday done by Dr Emelda FearFerguson. Patient states "Im scared I may have popped a stitch on the inside." Patient denies any fevers or drianage from incision site. Patient also states lower back pain. Per patient has not had BM since surgery but has also not "eaten anything."

## 2015-11-11 NOTE — Discharge Instructions (Signed)
Your CT scan was reassuring - no signs of bleeding No infectious Zofran or Phenergan for nausea Stop taking oxycodone (percocet) Start using hydrocodone (vicodin) and a stool softener for pain as needed.  Meds given in ED:  Medications  HYDROmorphone (DILAUDID) injection 0.5 mg (0.5 mg Intravenous Given 11/11/15 0823)  0.9 %  sodium chloride infusion ( Intravenous New Bag/Given 11/11/15 0823)  ondansetron (ZOFRAN) injection 4 mg (4 mg Intravenous Given 11/11/15 0823)  sodium chloride 0.9 % bolus 1,000 mL (0 mLs Intravenous Stopped 11/11/15 1000)  iopamidol (ISOVUE-300) 61 % injection 100 mL (100 mLs Intravenous Contrast Given 11/11/15 1037)  HYDROcodone-acetaminophen (NORCO/VICODIN) 5-325 MG per tablet 2 tablet (2 tablets Oral Given 11/11/15 1138)    New Prescriptions   HYDROCODONE-ACETAMINOPHEN (NORCO/VICODIN) 5-325 MG TABLET    Take 2 tablets by mouth every 4 (four) hours as needed.   ONDANSETRON (ZOFRAN ODT) 4 MG DISINTEGRATING TABLET    Take 1 tablet (4 mg total) by mouth every 8 (eight) hours as needed for nausea.

## 2015-11-11 NOTE — ED Provider Notes (Signed)
History  By signing my name below, I, Karle PlumberJennifer Tensley, attest that this documentation has been prepared under the direction and in the presence of Eber HongBrian Makaylie Dedeaux, MD. Electronically Signed: Karle PlumberJennifer Tensley, ED Scribe. 11/11/2015. 11:38 AM  Chief Complaint  Patient presents with  . Post-op Problem   The history is provided by the patient and medical records. No language interpreter was used.    HPI Comments:  Norma [name] is a 40 y.o. obese female who presents to the Emergency Department complaining of nausea that began last night. Pt reports having a total abdominal hysterectomy by Dr. Emelda FearFerguson and was discharged two days ago. She states she has been taking Percocet and Toradol for pain and believes that is causing her nausea. She states she has not been drinking and eating as much as she should. She reports some lower abdominal pain as well.   The patient left the hospital after 24 hours at her own request, she was supposed to stay an additional 24. She states that the pain is increased, it is persistent, moderate, associated with one episode of vomiting though she thinks this may be because of the Percocet she has been taking. She has had very little to eat or drink in the last 24 hours.  Past Medical History  Diagnosis Date  . COPD (chronic obstructive pulmonary disease) (HCC)   . Hypertension   . IBS (irritable bowel syndrome)   . Anxiety   . History of kidney stones   . Depression    Past Surgical History  Procedure Laterality Date  . Dilation and curettage of uterus    . Abdominal hysterectomy N/A 11/09/2015    Procedure: HYSTERECTOMY ABDOMINAL;  Surgeon: Tilda BurrowJohn Ferguson V, MD;  Location: [facility];  Service: Gynecology;  Laterality: N/A;  . Salpingoophorectomy Bilateral 11/09/2015    Procedure: SALPINGO OOPHORECTOMY;  Surgeon: Tilda BurrowJohn Ferguson V, MD;  Location: [facility];  Service: Gynecology;  Laterality: Bilateral;   Family History  Problem Relation Age of Onset  . COPD Mother    . Diabetes Father   . Hypertension Father   . Heart disease Sister   . Heart disease Maternal Aunt   . Birth defects Daughter   . Kidney disease Daughter    Social History  Substance Use Topics  . Smoking status: Current Every Day Smoker -- 1.00 packs/day for 19 years    Types: Cigarettes  . Smokeless tobacco: Never Used  . Alcohol Use: No   OB History    Gravida Para Term Preterm AB TAB SAB Ectopic Multiple Living   2 2 2       2      Review of Systems  Constitutional: Positive for appetite change.  Gastrointestinal: Positive for nausea and abdominal pain.  Skin: Positive for wound.  All other systems reviewed and are negative.   Allergies  Review of patient's allergies indicates no known allergies.  Home Medications   Prior to Admission medications   Medication Sig Start Date End Date Taking? Authorizing Provider  albuterol (PROVENTIL HFA;VENTOLIN HFA) 108 (90 BASE) MCG/ACT inhaler Inhale 2 puffs into the lungs every 6 (six) hours as needed for wheezing.   Yes Historical Provider, MD  albuterol (PROVENTIL) (2.5 MG/3ML) 0.083% nebulizer solution Take 2.5 mg by nebulization every 6 (six) hours as needed for wheezing.   Yes Historical Provider, MD  estradiol (ESTRACE) 1 MG tablet Take 1 tablet (1 mg total) by mouth daily. 11/10/15  Yes Tilda BurrowJohn Ferguson V, MD  hydrochlorothiazide (HYDRODIURIL) 25 MG tablet  Take 25 mg by mouth daily.   Yes Historical Provider, MD  ketorolac (TORADOL) 10 MG tablet Take 1 tablet (10 mg total) by mouth every 6 (six) hours as needed (five day limit postop). 11/10/15  Yes Tilda Burrow, MD  lisinopril (PRINIVIL,ZESTRIL) 10 MG tablet Take 10 mg by mouth daily.   Yes Historical Provider, MD  nicotine (NICODERM CQ) 21 mg/24hr patch Place 1 patch (21 mg total) onto the skin daily. 10/10/15  Yes Tilda Burrow, MD  OXYGEN Inhale 2 L into the lungs daily.   Yes Historical Provider, MD  promethazine (PHENERGAN) 25 MG tablet Take 1 tablet (25 mg total) by  mouth every 6 (six) hours as needed for nausea or vomiting. 09/28/15  Yes Tilda Burrow, MD  QVAR 80 MCG/ACT inhaler Inhale 2 puffs into the lungs daily. 09/19/15  Yes Historical Provider, MD  SPIRIVA HANDIHALER 18 MCG inhalation capsule Place 1 capsule into inhaler and inhale daily. 09/18/15  Yes Historical Provider, MD  HYDROcodone-acetaminophen (NORCO/VICODIN) 5-325 MG tablet Take 2 tablets by mouth every 4 (four) hours as needed. 11/11/15   Eber Hong, MD  ondansetron (ZOFRAN ODT) 4 MG disintegrating tablet Take 1 tablet (4 mg total) by mouth every 8 (eight) hours as needed for nausea. 11/11/15   Eber Hong, MD   Triage Vitals: BP 137/85 mmHg  Pulse 104  Temp(Src) 98.1 F (36.7 C) (Oral)  Resp 18  Ht  (1.575 m)  Wt 194 lb (87.998 kg)  BMI 35.47 kg/m2  SpO2 98%  LMP 09/24/2015 Physical Exam  Constitutional: She appears well-developed and well-nourished. No distress.  HENT:  Head: Normocephalic and atraumatic.  Mouth/Throat: Mucous membranes are dry. No oropharyngeal exudate.  Eyes: Conjunctivae and EOM are normal. Pupils are equal, round, and reactive to light. Right eye exhibits no discharge. Left eye exhibits no discharge. No scleral icterus.  Neck: Normal range of motion. Neck supple. No JVD present. No thyromegaly present.  Cardiovascular: Regular rhythm, normal heart sounds and intact distal pulses.  Exam reveals no gallop and no friction rub.   No murmur heard. Mildly tachycardic at 104-105.  Pulmonary/Chest: Effort normal and breath sounds normal. No respiratory distress. She has no wheezes. She has no rales.  Abdominal: Soft. Bowel sounds are normal. She exhibits no distension and no mass. There is tenderness ( Soft but diffuse moderate tenderness with increased tenderness in the suprapubic and bilateral lower abdominal regions, no guarding or peritoneal signs).  Vertical incision spanning from umbilicus to suprapubic region. No redness, no drainage, no foul smell. Diffuse  tenderness without guarding or peritoneal signs.  Musculoskeletal: Normal range of motion. She exhibits no edema or tenderness.  Lymphadenopathy:    She has no cervical adenopathy.  Neurological: She is alert. Coordination normal.  Skin: Skin is warm and dry. No rash noted. No erythema.  Psychiatric: She has a normal mood and affect. Her behavior is normal.  Nursing note and vitals reviewed.   ED Course  Procedures (including critical care time) DIAGNOSTIC STUDIES: Oxygen Saturation is 98% on RA, normal by my interpretation.   COORDINATION OF CARE: 8:14 AM- Will order IV fluids, urinalysis and imaging. Pt verbalizes understanding and agrees to plan.  Medications  HYDROmorphone (DILAUDID) injection 0.5 mg (0.5 mg Intravenous Given 11/11/15 0823)  0.9 %  sodium chloride infusion ( Intravenous New Bag/Given 11/11/15 0823)  ondansetron (ZOFRAN) injection 4 mg (4 mg Intravenous Given 11/11/15 0823)  sodium chloride 0.9 % bolus 1,000 mL (0 mLs Intravenous Stopped  11/11/15 1000)  iopamidol (ISOVUE-300) 61 % injection 100 mL (100 mLs Intravenous Contrast Given 11/11/15 1037)  HYDROcodone-acetaminophen (NORCO/VICODIN) 5-325 MG per tablet 2 tablet (2 tablets Oral Given 11/11/15 1138)   Labs Review Labs Reviewed  CBC WITH DIFFERENTIAL/PLATELET - Abnormal; Notable for the following:    WBC 11.9 (*)    RBC 3.71 (*)    Hemoglobin 11.0 (*)    HCT 32.9 (*)    RDW 15.6 (*)    Neutro Abs 10.1 (*)    All other components within normal limits  BASIC METABOLIC PANEL - Abnormal; Notable for the following:    Potassium 3.1 (*)    Calcium 8.6 (*)    All other components within normal limits  URINALYSIS, ROUTINE W REFLEX MICROSCOPIC (NOT AT [facility]) - Abnormal; Notable for the following:    Hgb urine dipstick MODERATE (*)    Ketones, ur 40 (*)    All other components within normal limits  URINE MICROSCOPIC-ADD ON - Abnormal; Notable for the following:    Squamous Epithelial / LPF 0-5 (*)    Bacteria, UA  RARE (*)    All other components within normal limits  URINE CULTURE    Imaging Review Ct Abdomen Pelvis W Contrast  11/11/2015  CLINICAL DATA:  Constant low abdominal pain with nausea and vomiting, hysterectomy 2 days ago EXAM: CT ABDOMEN AND PELVIS WITH CONTRAST TECHNIQUE: Multidetector CT imaging of the abdomen and pelvis was performed using the standard protocol following bolus administration of intravenous contrast. CONTRAST:  ISOVUE-300 IOPAMIDOL (ISOVUE-300) INJECTION 61% COMPARISON:  09/15/2015, 07/04/2014 FINDINGS: Lower chest: No acute findings. 2 mm nodule peripherally in the left lower lobe image 5. This stable Hepatobiliary: Trace perihepatic fluid decreased from 07/04/2014 and stable from 09/15/2015. No focal hepatic parenchymal abnormalities. Gallbladder normal. Pancreas: Normal Spleen: Normal Adrenals/Urinary Tract: Normal adrenal glands. Left kidney is normal. 4 mm lower pole nonobstructing stone right kidney. Bladder normal. Stomach/Bowel: Nonobstructive bowel gas pattern. Stomach small bowel and large bowel within normal limits. Vascular/Lymphatic: Mild aortic calcification. No significant adenopathy. Reproductive: The uterus is now surgically absent. There is a small volume of pneumoperitoneum in the pelvis and to a lesser degree in the abdomen, consistent with recent surgery. There is small heterogenous fluid collection in the surgical bed measuring about 5.5 x 3.0 cm Musculoskeletal: No acute findings IMPRESSION: Anticipated findings status post hysterectomy, including a small volume of pneumoperitoneum. Small postoperative fluid collection also not necessarily unexpected but imaging follow up suggested if symptoms persist. Electronically Signed   By: Esperanza Heir M.D.   On: 11/11/2015 11:12   I have personally reviewed and evaluated these images and lab results as part of my medical decision-making.    MDM   Final diagnoses:  Postoperative vomiting    The patient is  overall well-appearing, her leukocytosis is 11.9, hemoglobin is 11, potassium is slightly low and she does have ketones in the urine consistent with dehydration. She will be given IV fluids, nausea medication, pain medication and a CT scan to rule out postoperative hemorrhage or consultation.  CT reassugin Labs reassuring. Pt tolerated PO Stop oxycodone Start hydrocodone with stool softener and zofran Pt in agreement.  Meds given in ED:  Medications  HYDROmorphone (DILAUDID) injection 0.5 mg (0.5 mg Intravenous Given 11/11/15 0823)  0.9 %  sodium chloride infusion ( Intravenous New Bag/Given 11/11/15 0823)  ondansetron (ZOFRAN) injection 4 mg (4 mg Intravenous Given 11/11/15 0823)  sodium chloride 0.9 % bolus 1,000 mL (0 mLs Intravenous  Stopped 11/11/15 1000)  iopamidol (ISOVUE-300) 61 % injection 100 mL (100 mLs Intravenous Contrast Given 11/11/15 1037)  HYDROcodone-acetaminophen (NORCO/VICODIN) 5-325 MG per tablet 2 tablet (2 tablets Oral Given 11/11/15 1138)    New Prescriptions   HYDROCODONE-ACETAMINOPHEN (NORCO/VICODIN) 5-325 MG TABLET    Take 2 tablets by mouth every 4 (four) hours as needed.   ONDANSETRON (ZOFRAN ODT) 4 MG DISINTEGRATING TABLET    Take 1 tablet (4 mg total) by mouth every 8 (eight) hours as needed for nausea.     I personally performed the services described in this documentation, which was scribed in my presence. The recorded information has been reviewed and is accurate.       Eber Hong, MD 11/11/15 1141

## 2015-11-13 ENCOUNTER — Telehealth: Payer: Self-pay | Admitting: Obstetrics and Gynecology

## 2015-11-13 LAB — URINE CULTURE

## 2015-11-13 NOTE — Telephone Encounter (Signed)
ED records of ED visit Saturday noted, left message for pt to call our office and let us know how she's doing.

## 2015-11-14 ENCOUNTER — Encounter: Payer: Medicaid Other | Admitting: Obstetrics and Gynecology

## 2015-11-16 ENCOUNTER — Encounter: Payer: Self-pay | Admitting: Obstetrics and Gynecology

## 2015-11-16 ENCOUNTER — Ambulatory Visit (INDEPENDENT_AMBULATORY_CARE_PROVIDER_SITE_OTHER): Payer: Medicaid Other | Admitting: Obstetrics and Gynecology

## 2015-11-16 VITALS — BP 120/76 | Ht 62.0 in | Wt 185.0 lb

## 2015-11-16 DIAGNOSIS — Z9889 Other specified postprocedural states: Secondary | ICD-10-CM

## 2015-11-16 DIAGNOSIS — N803 Endometriosis of pelvic peritoneum: Secondary | ICD-10-CM | POA: Insufficient documentation

## 2015-11-16 DIAGNOSIS — IMO0002 Reserved for concepts with insufficient information to code with codable children: Secondary | ICD-10-CM

## 2015-11-16 MED ORDER — HYDROCODONE-ACETAMINOPHEN 5-325 MG PO TABS: 1.0000 | ORAL_TABLET | ORAL | Status: DC | PRN

## 2015-11-16 NOTE — Progress Notes (Signed)
  Subjective:  Norma [name] is a 40 y.o. female now 1 weeks status post total abdominal hysterectomy, bilateral oophorectomy and bilateral salpingectomy. Dc::Endometriomas bilateral. Patient has begun her surgical postmenopausal hormone therapy, and the hot sweats have gone away. She was seen in the emergency room the day after discharge due to pain management and her inability to take Percocet She complains of mild lower abdominal soreness at the incision site and skin irritation where the tape is applied. She reports she is able to pass gas. She denies fever or difficulty with having a BM.   Review of Systems Negative except soreness at the incision site.    Diet: normal    Bowel movements : normal. Pain controlled with hydrocodone which was prescribed in the ED. Was prescribed Percocet post-op but this was causing nausea.   Objective:  BP 120/76 mmHg  Ht 5\' 2"  (1.575 m)  Wt 185 lb (83.915 kg)  BMI 33.83 kg/m2  LMP 09/24/2015 General:Well developed, well nourished.  No acute distress. Abdomen: Bowel sounds normal, soft, non-tender. Pelvic Exam: deferred    Incision(s):   Well-healing incision, no drainage, no erythema, no hernia, no swelling, no dehiscence.    Assessment:  Post-Op 1 weeks s/p total abdominal hysterectomy with bilateral salpingoophorectomy      Doing well postoperatively.   Plan:  1.Wound care discussed   2. Current medications: Vicodin for pain control   3. Activity restrictions: refrain from sexual activity until next visit and driving for 1 week.  4. return to work: not applicable. 5. Follow up in 4 week.  By signing my name below, I, Sonum Patel, attest that this documentation has been prepared under the direction and in the presence of Tilda BurrowJohn Roxane Puerto V, MD. Electronically Signed: Sonum Patel, Neurosurgeoncribe. 11/16/2015. 9:35 AM.   I personally performed the services described in this documentation, which was SCRIBED in my presence. The recorded information  has been reviewed and considered accurate. It has been edited as necessary during review. [name],Yoshimi Sarr V, MD

## 2015-11-16 NOTE — Progress Notes (Signed)
Patient ID: Norma ClassJennifer L Heinze, female   DOB: 12/01/1975, 40 y.o.   MRN: [id number] Pt here today for first post op visit. Pt states that she still has a lot of soreness. Pt denies any redness or drainage from incision site.

## 2015-11-18 NOTE — Progress Notes (Signed)
Cancelled appt. Surgery rescheduled.

## 2015-11-22 NOTE — Op Note (Signed)
Please see the brief operative note for surgical details 

## 2015-12-15 ENCOUNTER — Ambulatory Visit: Payer: Medicaid Other | Admitting: Obstetrics and Gynecology

## 2015-12-15 ENCOUNTER — Encounter: Payer: Self-pay | Admitting: Obstetrics and Gynecology

## 2016-02-11 ENCOUNTER — Other Ambulatory Visit: Payer: Self-pay | Admitting: Obstetrics and Gynecology

## 2016-04-01 ENCOUNTER — Encounter (HOSPITAL_COMMUNITY): Payer: Self-pay | Admitting: Emergency Medicine

## 2016-04-01 ENCOUNTER — Emergency Department (HOSPITAL_COMMUNITY): Payer: Medicaid Other

## 2016-04-01 ENCOUNTER — Emergency Department (HOSPITAL_COMMUNITY)
Admission: EM | Admit: 2016-04-01 | Discharge: 2016-04-01 | Disposition: A | Discharge status: Home / Self Care | Payer: Medicaid Other | Attending: Emergency Medicine | Admitting: Emergency Medicine

## 2016-04-01 DIAGNOSIS — Z79899 Other long term (current) drug therapy: Secondary | ICD-10-CM | POA: Insufficient documentation

## 2016-04-01 DIAGNOSIS — F17210 Nicotine dependence, cigarettes, uncomplicated: Secondary | ICD-10-CM | POA: Diagnosis not present

## 2016-04-01 DIAGNOSIS — R109 Unspecified abdominal pain: Secondary | ICD-10-CM

## 2016-04-01 DIAGNOSIS — J449 Chronic obstructive pulmonary disease, unspecified: Secondary | ICD-10-CM | POA: Insufficient documentation

## 2016-04-01 DIAGNOSIS — I10 Essential (primary) hypertension: Secondary | ICD-10-CM | POA: Insufficient documentation

## 2016-04-01 DIAGNOSIS — R1011 Right upper quadrant pain: Secondary | ICD-10-CM | POA: Diagnosis not present

## 2016-04-01 LAB — BASIC METABOLIC PANEL WITH GFR
Anion gap: 6 (ref 5–15)
BUN: 15 mg/dL (ref 6–20)
CO2: 25 mmol/L (ref 22–32)
Calcium: 8.8 mg/dL — ABNORMAL LOW (ref 8.9–10.3)
Chloride: 108 mmol/L (ref 101–111)
Creatinine, Ser: 0.56 mg/dL (ref 0.44–1.00)
GFR calc Af Amer: >60 mL/min
GFR calc non Af Amer: >60 mL/min
Glucose, Bld: 110 mg/dL — ABNORMAL HIGH (ref 65–99)
Potassium: 4.2 mmol/L (ref 3.5–5.1)
Sodium: 139 mmol/L (ref 135–145)

## 2016-04-01 LAB — CBC
HEMATOCRIT: 37.5 % (ref 36.0–46.0)
Hemoglobin: 12.1 g/dL (ref 12.0–15.0)
MCH: 28.7 pg (ref 26.0–34.0)
MCHC: 32.3 g/dL (ref 30.0–36.0)
MCV: 89.1 fL (ref 78.0–100.0)
PLATELETS: 312 10*3/uL (ref 150–400)
RBC: 4.21 MIL/uL (ref 3.87–5.11)
RDW: 16.7 % — AB (ref 11.5–15.5)
WBC: 7.3 10*3/uL (ref 4.0–10.5)

## 2016-04-01 LAB — TROPONIN I: Troponin I: <0.03 ng/mL (ref <0.03)

## 2016-04-01 MED ORDER — HYDROCODONE-ACETAMINOPHEN 5-325 MG PO TABS
1.0000 | ORAL_TABLET | Freq: Once | ORAL | Status: AC
  Administered 2016-04-01: 1 via ORAL
  Filled 2016-04-01: qty 1

## 2016-04-01 MED ORDER — PANTOPRAZOLE SODIUM 20 MG PO TBEC: 20.0000 mg | DELAYED_RELEASE_TABLET | Freq: Every day | ORAL | 0 refills | Status: DC

## 2016-04-01 MED ORDER — TRAMADOL HCL 50 MG PO TABS: 50.0000 mg | ORAL_TABLET | Freq: Four times a day (QID) | ORAL | 0 refills | Status: DC | PRN

## 2016-04-01 NOTE — ED Triage Notes (Signed)
Pt reports sudden onset of epigastric pain that radiates to abdomen that started approx 1 hr ago. Pt hx of reflux.

## 2016-04-01 NOTE — Discharge Instructions (Signed)
Follow-up with your doctor in 1-2 weeks for recheck. Follow-up with [facility] in 1-2 weeks for your kidney stone

## 2016-04-01 NOTE — ED Provider Notes (Signed)
[facility] Provider Note   CSN: [id number] Arrival date & time: 04/01/16  1104     History   Chief Complaint Chief Complaint  Patient presents with  . Chest Pain    HPI Norma [name] is a 40 y.o. female.  Patient complains of epigastric abdominal pain   The history is provided by the patient. No language interpreter was used.  Abdominal Pain   This is a new problem. The current episode started more than 1 week ago. The problem occurs rarely. The problem has not changed since onset.Associated with: Nothing. The pain is located in the RUQ. The quality of the pain is dull. The pain is at a severity of 6/10. The pain is moderate. Pertinent negatives include anorexia, diarrhea, frequency, hematuria and headaches. Nothing aggravates the symptoms. Nothing relieves the symptoms. Past workup does not include GI consult.    Past Medical History:  Diagnosis Date  . Anxiety   . COPD (chronic obstructive pulmonary disease) (HCC)   . Depression   . History of kidney stones   . Hypertension   . IBS (irritable bowel syndrome)     Patient Active Problem List   Diagnosis Date Noted  . Endometriosis of pelvis 11/16/2015  . 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 Rectal fissure 10/04/2015    Past Surgical History:  Procedure Laterality Date  . ABDOMINAL HYSTERECTOMY N/A 11/09/2015   Procedure: HYSTERECTOMY ABDOMINAL;  Surgeon: Tilda Burrow, MD;  Location: [facility];  Service: Gynecology;  Laterality: N/A;  . DILATION AND CURETTAGE OF UTERUS    . SALPINGOOPHORECTOMY Bilateral 11/09/2015   Procedure: SALPINGO OOPHORECTOMY;  Surgeon: Tilda Burrow, MD;  Location: [facility];  Service: Gynecology;  Laterality: Bilateral;    OB History    Gravida Para Term Preterm AB Living   2 2 2     2    SAB TAB Ectopic Multiple Live Births                   Home Medications    Prior to Admission medications   Medication Sig Start  Date End Date Taking? Authorizing Provider  albuterol (PROVENTIL HFA;VENTOLIN HFA) 108 (90 BASE) MCG/ACT inhaler Inhale 2 puffs into the lungs every 6 (six) hours as needed for wheezing.   Yes Historical Provider, MD  albuterol (PROVENTIL) (2.5 MG/3ML) 0.083% nebulizer solution Take 2.5 mg by nebulization every 6 (six) hours as needed for wheezing.   Yes Historical Provider, MD  estradiol (ESTRACE) 1 MG tablet TAKE 1 TABLET (1 MG TOTAL) BY MOUTH DAILY. 02/12/16  Yes Tilda Burrow, MD  hydrochlorothiazide (HYDRODIURIL) 25 MG tablet Take 25 mg by mouth daily.   Yes Historical Provider, MD  HYDROcodone-acetaminophen (NORCO/VICODIN) 5-325 MG tablet Take 1 tablet by mouth every 4 (four) hours as needed for moderate pain. 11/16/15  Yes Tilda Burrow, MD  lisinopril (PRINIVIL,ZESTRIL) 10 MG tablet Take 10 mg by mouth daily.   Yes Historical Provider, MD  QVAR 80 MCG/ACT inhaler Inhale 2 puffs into the lungs daily. 09/19/15  Yes Historical Provider, MD  pantoprazole (PROTONIX) 20 MG tablet Take 1 tablet (20 mg total) by mouth daily. 04/01/16   Bethann Berkshire, MD  traMADol (ULTRAM) 50 MG tablet Take 1 tablet (50 mg total) by mouth every 6 (six) hours as needed. 04/01/16   Bethann Berkshire, MD    Family History Family History  Problem Relation Age of Onset  . COPD Mother   . Diabetes Father   .  Hypertension Father   . Heart disease Sister   . Heart disease Maternal Aunt   . Birth defects Daughter   . Kidney disease Daughter     Social History Social History  Substance Use Topics  . Smoking status: Current Every Day Smoker    Packs/day: 1.50    Years: 19.00    Types: Cigarettes  . Smokeless tobacco: Never Used  . Alcohol use No     Allergies   Review of patient's allergies indicates no known allergies.   Review of Systems Review of Systems  Constitutional: Negative for appetite change and fatigue.  HENT: Negative for congestion, ear discharge and sinus pressure.   Eyes: Negative for  discharge.  Respiratory: Negative for cough.   Cardiovascular: Negative for chest pain.  Gastrointestinal: Positive for abdominal pain. Negative for anorexia and diarrhea.  Genitourinary: Negative for frequency and hematuria.  Musculoskeletal: Negative for back pain.  Skin: Negative for rash.  Neurological: Negative for seizures and headaches.  Psychiatric/Behavioral: Negative for hallucinations.     Physical Exam Updated Vital Signs BP 133/79   Pulse 80   Temp 98.2 F (36.8 C) (Oral)   Resp 22   Ht 5\' 2"  (1.575 m)   Wt 180 lb (81.6 kg)   LMP 09/24/2015   SpO2 97%   BMI 32.92 kg/m   Physical Exam  Constitutional: She is oriented to person, place, and time. She appears well-developed.  HENT:  Head: Normocephalic.  Eyes: Conjunctivae and EOM are normal. No scleral icterus.  Neck: Neck supple. No thyromegaly present.  Cardiovascular: Normal rate and regular rhythm.  Exam reveals no gallop and no friction rub.   No murmur heard. Pulmonary/Chest: No stridor. She has no wheezes. She has no rales. She exhibits no tenderness.  Abdominal: She exhibits no distension. There is tenderness. There is no rebound.  Musculoskeletal: Normal range of motion. She exhibits no edema.  Lymphadenopathy:    She has no cervical adenopathy.  Neurological: She is oriented to person, place, and time. She exhibits normal muscle tone. Coordination normal.  Skin: No rash noted. No erythema.  Psychiatric: She has a normal mood and affect. Her behavior is normal.     ED Treatments / Results  Labs (all labs ordered are listed, but only abnormal results are displayed) Labs Reviewed  BASIC METABOLIC PANEL - Abnormal; Notable for the following:       Result Value   Glucose, Bld 110 (*)    Calcium 8.8 (*)    All other components within normal limits  CBC - Abnormal; Notable for the following:    RDW 16.7 (*)    All other components within normal limits  TROPONIN I    EKG  EKG  Interpretation None       Radiology Dg Chest 2 View  Result Date: 04/01/2016 CLINICAL DATA:  Acute onset chest and epigastric pain 1 hour ago. COPD. EXAM: CHEST  2 VIEW COMPARISON:  10/20/2015 FINDINGS: The heart size and mediastinal contours are within normal limits. Both lungs are clear. The visualized skeletal structures are unremarkable. IMPRESSION: No active cardiopulmonary disease. Electronically Signed   By: Myles Rosenthal M.D.   On: 04/01/2016 11:50   US Abdomen Complete  Result Date: 04/01/2016 CLINICAL DATA:  Sudden onset epigastric pain EXAM: ABDOMEN ULTRASOUND COMPLETE COMPARISON:  CT abdomen pelvis 11/11/2015 FINDINGS: Gallbladder: No gallstones or wall thickening visualized. No sonographic Murphy sign noted by sonographer. Common bile duct: Diameter: 3 mm Liver: No focal lesion.  Mildly increased  hepatic echogenicity. IVC: No abnormality visualized. Pancreas: Visualized portion is normal. The visualization was limited by bowel gas Spleen: Size and appearance within normal limits. Right Kidney: Length:  10.8 cm.  1.4 cm calculus at the lower pole. Left Kidney: Length: 10.5 cm. Echogenicity within normal limits. No mass or hydronephrosis visualized. Abdominal aorta: No aneurysm visualized. Other findings: None. IMPRESSION: 1. Right nephrolithiasis with stone measuring 1.4 cm. No hydronephrosis. 2.  Mild hepatic steatosis. 3.  No evidence of acute cholecystitits. Electronically Signed   By: Deatra RobinsonKevin  Herman M.D.   On: 04/01/2016 15:53    Procedures Procedures (including critical care time)  Medications Ordered in ED Medications  HYDROcodone-acetaminophen (NORCO/VICODIN) 5-325 MG per tablet 1 tablet (not administered)     Initial Impression / Assessment and Plan / ED Course  I have reviewed the triage vital signs and the nursing notes.  Pertinent labs & imaging results that were available during my care of the patient were reviewed by me and considered in my medical decision making  (see chart for details).  Clinical Course    Labs unremarkable. Ultrasound shows large kidney stone in the right. Patient will be sent home with protonic and pain medicine is referred to urology and will follow-up with her PCP  Final Clinical Impressions(s) / ED Diagnoses   Final diagnoses:  Abdominal pain in female patient    New Prescriptions New Prescriptions   PANTOPRAZOLE (PROTONIX) 20 MG TABLET    Take 1 tablet (20 mg total) by mouth daily.   TRAMADOL (ULTRAM) 50 MG TABLET    Take 1 tablet (50 mg total) by mouth every 6 (six) hours as needed.     Bethann BerkshireJoseph Oden Lindaman, MD 04/01/16 [phone]

## 2016-04-01 NOTE — ED Notes (Signed)
EKG completed at 1114 in triage. EKG clicked off in error at time of 1411.

## 2016-06-02 ENCOUNTER — Emergency Department (HOSPITAL_COMMUNITY): Payer: Medicaid Other

## 2016-06-02 ENCOUNTER — Encounter (HOSPITAL_COMMUNITY): Payer: Self-pay | Admitting: Emergency Medicine

## 2016-06-02 ENCOUNTER — Emergency Department (HOSPITAL_COMMUNITY)
Admission: EM | Admit: 2016-06-02 | Discharge: 2016-06-02 | Disposition: A | Discharge status: Home / Self Care | Payer: Medicaid Other | Attending: Emergency Medicine | Admitting: Emergency Medicine

## 2016-06-02 DIAGNOSIS — F17210 Nicotine dependence, cigarettes, uncomplicated: Secondary | ICD-10-CM | POA: Insufficient documentation

## 2016-06-02 DIAGNOSIS — E876 Hypokalemia: Secondary | ICD-10-CM | POA: Diagnosis not present

## 2016-06-02 DIAGNOSIS — N3000 Acute cystitis without hematuria: Secondary | ICD-10-CM | POA: Insufficient documentation

## 2016-06-02 DIAGNOSIS — R509 Fever, unspecified: Secondary | ICD-10-CM | POA: Diagnosis present

## 2016-06-02 DIAGNOSIS — Z79899 Other long term (current) drug therapy: Secondary | ICD-10-CM | POA: Insufficient documentation

## 2016-06-02 DIAGNOSIS — E860 Dehydration: Secondary | ICD-10-CM | POA: Diagnosis not present

## 2016-06-02 DIAGNOSIS — I10 Essential (primary) hypertension: Secondary | ICD-10-CM | POA: Insufficient documentation

## 2016-06-02 DIAGNOSIS — J449 Chronic obstructive pulmonary disease, unspecified: Secondary | ICD-10-CM | POA: Diagnosis not present

## 2016-06-02 LAB — COMPREHENSIVE METABOLIC PANEL
ALBUMIN: 3.2 g/dL — AB (ref 3.5–5.0)
ALK PHOS: 51 U/L (ref 38–126)
ALT: 12 U/L — AB (ref 14–54)
AST: 19 U/L (ref 15–41)
Anion gap: 11 (ref 5–15)
BUN: 19 mg/dL (ref 6–20)
CALCIUM: 8.7 mg/dL — AB (ref 8.9–10.3)
CO2: 21 mmol/L — AB (ref 22–32)
CREATININE: 1.35 mg/dL — AB (ref 0.44–1.00)
Chloride: 101 mmol/L (ref 101–111)
GFR calc Af Amer: 56 mL/min — ABNORMAL LOW (ref >60)
GFR calc non Af Amer: 49 mL/min — ABNORMAL LOW (ref >60)
GLUCOSE: 123 mg/dL — AB (ref 65–99)
Potassium: 2.5 mmol/L — CL (ref 3.5–5.1)
SODIUM: 133 mmol/L — AB (ref 135–145)
Total Bilirubin: 0.5 mg/dL (ref 0.3–1.2)
Total Protein: 6.6 g/dL (ref 6.5–8.1)

## 2016-06-02 LAB — I-STAT BETA HCG BLOOD, ED (MC, WL, AP ONLY): I-stat hCG, quantitative: 8.5 m[IU]/mL — ABNORMAL HIGH (ref <5)

## 2016-06-02 LAB — URINALYSIS, ROUTINE W REFLEX MICROSCOPIC
Glucose, UA: NEGATIVE mg/dL
KETONES UR: NEGATIVE mg/dL
NITRITE: NEGATIVE
Protein, ur: 100 mg/dL — AB
Specific Gravity, Urine: 1.025 (ref 1.005–1.030)
pH: 6 (ref 5.0–8.0)

## 2016-06-02 LAB — CBC WITH DIFFERENTIAL/PLATELET
Basophils Absolute: 0 10*3/uL (ref 0.0–0.1)
Basophils Relative: 0 %
Eosinophils Absolute: 0 10*3/uL (ref 0.0–0.7)
Eosinophils Relative: 0 %
HEMATOCRIT: 36.6 % (ref 36.0–46.0)
HEMOGLOBIN: 12.5 g/dL (ref 12.0–15.0)
LYMPHS PCT: 7 %
Lymphs Abs: 0.7 10*3/uL (ref 0.7–4.0)
MCH: 29.3 pg (ref 26.0–34.0)
MCHC: 34.2 g/dL (ref 30.0–36.0)
MCV: 85.9 fL (ref 78.0–100.0)
MONO ABS: 1.5 10*3/uL — AB (ref 0.1–1.0)
MONOS PCT: 15 %
NEUTROS ABS: 7.9 10*3/uL — AB (ref 1.7–7.7)
Neutrophils Relative %: 78 %
Platelets: 179 10*3/uL (ref 150–400)
RBC: 4.26 MIL/uL (ref 3.87–5.11)
RDW: 15.4 % (ref 11.5–15.5)
WBC: 10.1 10*3/uL (ref 4.0–10.5)

## 2016-06-02 LAB — URINE MICROSCOPIC-ADD ON

## 2016-06-02 LAB — I-STAT CG4 LACTIC ACID, ED: Lactic Acid, Venous: 1.41 mmol/L (ref 0.5–1.9)

## 2016-06-02 MED ORDER — POTASSIUM CHLORIDE CRYS ER 20 MEQ PO TBCR
40.0000 meq | EXTENDED_RELEASE_TABLET | Freq: Once | ORAL | Status: AC
Start: 2016-06-02 — End: 2016-06-02
  Administered 2016-06-02: 40 meq via ORAL
  Filled 2016-06-02: qty 2

## 2016-06-02 MED ORDER — IBUPROFEN 600 MG PO TABS: 600.0000 mg | ORAL_TABLET | Freq: Three times a day (TID) | ORAL | 0 refills | Status: DC | PRN

## 2016-06-02 MED ORDER — POTASSIUM CHLORIDE ER 10 MEQ PO TBCR: 10.0000 meq | EXTENDED_RELEASE_TABLET | Freq: Two times a day (BID) | ORAL | 0 refills | Status: DC

## 2016-06-02 MED ORDER — SODIUM CHLORIDE 0.9 % IV SOLN
1000.0000 mL | INTRAVENOUS | Status: DC
Start: 2016-06-02 — End: 2016-06-02
  Administered 2016-06-02: 1000 mL via INTRAVENOUS

## 2016-06-02 MED ORDER — ONDANSETRON 8 MG PO TBDP: 8.0000 mg | ORAL_TABLET | Freq: Three times a day (TID) | ORAL | 0 refills | Status: DC | PRN

## 2016-06-02 MED ORDER — DEXTROSE 5 % IV SOLN
1.0000 g | Freq: Once | INTRAVENOUS | Status: AC
  Administered 2016-06-02: 1 g via INTRAVENOUS
  Filled 2016-06-02: qty 10

## 2016-06-02 MED ORDER — OXYCODONE-ACETAMINOPHEN 5-325 MG PO TABS
1.0000 | ORAL_TABLET | Freq: Once | ORAL | Status: AC
Start: 2016-06-02 — End: 2016-06-02
  Administered 2016-06-02: 1 via ORAL
  Filled 2016-06-02: qty 1

## 2016-06-02 MED ORDER — SODIUM CHLORIDE 0.9 % IV SOLN
1000.0000 mL | Freq: Once | INTRAVENOUS | Status: AC
Start: 2016-06-02 — End: 2016-06-02
  Administered 2016-06-02: 1000 mL via INTRAVENOUS

## 2016-06-02 MED ORDER — ONDANSETRON HCL 4 MG/2ML IJ SOLN
4.0000 mg | Freq: Once | INTRAMUSCULAR | Status: AC
  Administered 2016-06-02: 4 mg via INTRAVENOUS
  Filled 2016-06-02: qty 2

## 2016-06-02 MED ORDER — IBUPROFEN 400 MG PO TABS
600.0000 mg | ORAL_TABLET | Freq: Once | ORAL | Status: AC
  Administered 2016-06-02: 600 mg via ORAL
  Filled 2016-06-02: qty 2

## 2016-06-02 MED ORDER — ONDANSETRON HCL 4 MG/2ML IJ SOLN: 4.0000 mg | Freq: Once | INTRAMUSCULAR | Status: DC

## 2016-06-02 MED ORDER — CEPHALEXIN 500 MG PO CAPS: 500.0000 mg | ORAL_CAPSULE | Freq: Three times a day (TID) | ORAL | 0 refills | Status: DC

## 2016-06-02 MED ORDER — MORPHINE SULFATE (PF) 4 MG/ML IV SOLN
4.0000 mg | Freq: Once | INTRAVENOUS | Status: AC
  Administered 2016-06-02: 4 mg via INTRAVENOUS
  Filled 2016-06-02: qty 1

## 2016-06-02 MED ORDER — SODIUM CHLORIDE 0.9 % IV SOLN
1000.0000 mL | Freq: Once | INTRAVENOUS | Status: AC
  Administered 2016-06-02: 1000 mL via INTRAVENOUS

## 2016-06-02 NOTE — ED Notes (Signed)
CRITICAL VALUE ALERT  Critical value received:  K+ 2.5  Date of notification:  06/02/16  Time of notification:  1615  Critical value read back:Yes.    Nurse who received alert:  Berdine DanceMandi Quentyn Kolbeck RN  MD notified (1st page):  Patria Maneampos  Time of first page:  1615  MD notified (2nd page):  Time of second page:  Responding MD:  Patria Maneampos  Time MD responded:  [phone]

## 2016-06-02 NOTE — ED Provider Notes (Signed)
[facility] Provider Note   CSN: [id number] Arrival date & time: 06/02/16  1520     History   Chief Complaint Chief Complaint  Patient presents with  . Headache  . Possible Sepsis    HPI Norma [name] is a 40 y.o. female.  HPI Patient reports mild headache without neck stiffness over the past 4-5 days.  Today she woke up and felt somewhat dizzy and lightheaded.  She's had decreased oral intake over the past several days.  She reports urinary frequency without dysuria.  Denies nausea vomiting.  She does report a sharp pain in her head.  Denies weakness in her arms or legs.  Denies abdominal pain.  No significant flank pain.  When she fell today she felt the ground and reports some mild pain in her left arm but is able to fully move it at this time.    Past Medical History:  Diagnosis Date  . Anxiety   . COPD (chronic obstructive pulmonary disease) (HCC)   . Depression   . History of kidney stones   . Hypertension   . IBS (irritable bowel syndrome)     Patient Active Problem List   Diagnosis Date Noted  . Endometriosis of pelvis 11/16/2015  . 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 Rectal fissure 10/04/2015    Past Surgical History:  Procedure Laterality Date  . ABDOMINAL HYSTERECTOMY N/A 11/09/2015   Procedure: HYSTERECTOMY ABDOMINAL;  Surgeon: Tilda BurrowJohn Ferguson V, MD;  Location: [facility];  Service: Gynecology;  Laterality: N/A;  . DILATION AND CURETTAGE OF UTERUS    . SALPINGOOPHORECTOMY Bilateral 11/09/2015   Procedure: SALPINGO OOPHORECTOMY;  Surgeon: Tilda BurrowJohn Ferguson V, MD;  Location: [facility];  Service: Gynecology;  Laterality: Bilateral;    OB History    Gravida Para Term Preterm AB Living   2 2 2     2    SAB TAB Ectopic Multiple Live Births                   Home Medications    Prior to Admission medications   Medication Sig Start Date End Date Taking? Authorizing Provider  albuterol (PROVENTIL  HFA;VENTOLIN HFA) 108 (90 BASE) MCG/ACT inhaler Inhale 2 puffs into the lungs every 6 (six) hours as needed for wheezing.   Yes Historical Provider, MD  albuterol (PROVENTIL) (2.5 MG/3ML) 0.083% nebulizer solution Take 2.5 mg by nebulization every 6 (six) hours as needed for wheezing.   Yes Historical Provider, MD  hydrochlorothiazide (HYDRODIURIL) 25 MG tablet Take 25 mg by mouth daily.   Yes Historical Provider, MD  lisinopril (PRINIVIL,ZESTRIL) 10 MG tablet Take 10 mg by mouth daily.   Yes Historical Provider, MD  QVAR 80 MCG/ACT inhaler Inhale 2 puffs into the lungs daily. 09/19/15  Yes Historical Provider, MD  estradiol (ESTRACE) 1 MG tablet TAKE 1 TABLET (1 MG TOTAL) BY MOUTH DAILY. Patient not taking: Reported on 06/02/2016 02/12/16   Tilda BurrowJohn V Ferguson, MD    Family History Family History  Problem Relation Age of Onset  . COPD Mother   . Diabetes Father   . Hypertension Father   . Heart disease Sister   . Heart disease Maternal Aunt   . Birth defects Daughter   . Kidney disease Daughter     Social History Social History  Substance Use Topics  . Smoking status: Current Every Day Smoker    Packs/day: 1.50    Years: 19.00    Types: Cigarettes  .  Smokeless tobacco: Never Used  . Alcohol use No     Allergies   Patient has no known allergies.   Review of Systems Review of Systems  All other systems reviewed and are negative.    Physical Exam Updated Vital Signs BP 93/62   Pulse 82   Temp 100.6 F (38.1 C) (Oral)   Resp 17   Ht 5\' 2"  (1.575 m)   Wt 184 lb (83.5 kg)   LMP 09/24/2015   SpO2 97%   BMI 33.65 kg/m   Physical Exam  Constitutional: She is oriented to person, place, and time. She appears well-developed and well-nourished. No distress.  HENT:  Head: Normocephalic and atraumatic.  Eyes: EOM are normal. Pupils are equal, round, and reactive to light.  Neck: Normal range of motion.  Cardiovascular: Normal rate, regular rhythm and normal heart sounds.     Pulmonary/Chest: Effort normal and breath sounds normal.  Abdominal: Soft. She exhibits no distension. There is no tenderness.  Musculoskeletal: Normal range of motion.  Neurological: She is alert and oriented to person, place, and time.  5/5 strength in major muscle groups of  bilateral upper and lower extremities. Speech normal. No facial asymetry.   Skin: Skin is warm and dry.  Psychiatric: She has a normal mood and affect. Judgment normal.  Nursing note and vitals reviewed.    ED Treatments / Results  Labs (all labs ordered are listed, but only abnormal results are displayed) Labs Reviewed  COMPREHENSIVE METABOLIC PANEL - Abnormal; Notable for the following:       Result Value   Sodium 133 (*)    Potassium 2.5 (*)    CO2 21 (*)    Glucose, Bld 123 (*)    Creatinine, Ser 1.35 (*)    Calcium 8.7 (*)    Albumin 3.2 (*)    ALT 12 (*)    GFR calc non Af Amer 49 (*)    GFR calc Af Amer 56 (*)    All other components within normal limits  CBC WITH DIFFERENTIAL/PLATELET - Abnormal; Notable for the following:    Neutro Abs 7.9 (*)    Monocytes Absolute 1.5 (*)    All other components within normal limits  URINALYSIS, ROUTINE W REFLEX MICROSCOPIC (NOT AT [facility]) - Abnormal; Notable for the following:    APPearance CLOUDY (*)    Hgb urine dipstick LARGE (*)    Bilirubin Urine SMALL (*)    Protein, ur 100 (*)    Leukocytes, UA SMALL (*)    All other components within normal limits  URINE MICROSCOPIC-ADD ON - Abnormal; Notable for the following:    Squamous Epithelial / LPF 0-5 (*)    Bacteria, UA MANY (*)    All other components within normal limits  I-STAT BETA HCG BLOOD, ED ([facility], [facility], [facility] ONLY) - Abnormal; Notable for the following:    I-stat hCG, quantitative 8.5 (*)    All other components within normal limits  CULTURE, BLOOD (ROUTINE X 2)  CULTURE, BLOOD (ROUTINE X 2)  URINE CULTURE  I-STAT CG4 LACTIC ACID, ED  I-STAT CG4 LACTIC ACID, ED    EKG  EKG  Interpretation  Date/Time:  Sunday June 02 2016 15:35:42 EST Ventricular Rate:  121 PR Interval:    QRS Duration: 95 QT Interval:  294 QTC Calculation: 418 R Axis:   19 Text Interpretation:  Sinus tachycardia Low voltage, precordial leads RSR' in V1 or V2, right VCD or RVH Baseline wander in lead(s) V1 No  significant change since 04/01/2016 Confirmed by DELO  MD, DOUGLAS ([id number]) on 06/03/2016 10:02:46 PM       Radiology Dg Chest 2 View  Result Date: 06/02/2016 CLINICAL DATA:  Fever. EXAM: CHEST  2 VIEW COMPARISON:  04/01/2016 and prior chest radiographs dating back to 04/07/2009 FINDINGS: The cardiomediastinal silhouette is unremarkable. There is no evidence of focal airspace disease, pulmonary edema, suspicious pulmonary nodule/mass, pleural effusion, or pneumothorax. No acute bony abnormalities are identified. IMPRESSION: No active cardiopulmonary disease. Electronically Signed   By: Harmon PierJeffrey  Hu M.D.   On: 06/02/2016 16:23    Procedures Procedures (including critical care time)  Medications Ordered in ED Medications  0.9 %  sodium chloride infusion (0 mLs Intravenous Stopped 06/02/16 1757)    Followed by  0.9 %  sodium chloride infusion (1,000 mLs Intravenous New Bag/Given 06/02/16 1756)    Followed by  0.9 %  sodium chloride infusion (0 mLs Intravenous Stopped 06/02/16 1826)  ondansetron (ZOFRAN) injection 4 mg (4 mg Intravenous Not Given 06/02/16 1717)  cefTRIAXone (ROCEPHIN) 1 g in dextrose 5 % 50 mL IVPB (1 g Intravenous New Bag/Given 06/02/16 1826)  oxyCODONE-acetaminophen (PERCOCET/ROXICET) 5-325 MG per tablet 1 tablet (not administered)  potassium chloride SA (K-DUR,KLOR-CON) CR tablet 40 mEq (40 mEq Oral Given 06/02/16 1713)  ondansetron (ZOFRAN) injection 4 mg (4 mg Intravenous Given 06/02/16 1714)  ibuprofen (ADVIL,MOTRIN) tablet 600 mg (600 mg Oral Given 06/02/16 1713)  morphine 4 MG/ML injection 4 mg (4 mg Intravenous Given 06/02/16 1714)     Initial  Impression / Assessment and Plan / ED Course  I have reviewed the triage vital signs and the nursing notes.  Pertinent labs & imaging results that were available during my care of the patient were reviewed by me and considered in my medical decision making (see chart for details).  Clinical Course     Overall the patient is well-appearing.  She feels much better after IV fluids.  She does have urinary tract infection and pretty profound hypokalemia.  She be treated his now patient with potassium and antibiotics.  She understands to return to the ER for new or worsening symptoms  Final Clinical Impressions(s) / ED Diagnoses   Final diagnoses:  Fever, unspecified fever cause  Acute cystitis without hematuria  Dehydration  Hypokalemia    New Prescriptions Discharge Medication List as of 06/02/2016  7:37 PM    START taking these medications   Details  cephALEXin (KEFLEX) 500 MG capsule Take 1 capsule (500 mg total) by mouth 3 (three) times daily., Starting Sun 06/02/2016, Print    potassium chloride (K-DUR) 10 MEQ tablet Take 1 tablet (10 mEq total) by mouth 2 (two) times daily., Starting Sun 06/02/2016, Print    ondansetron (ZOFRAN ODT) 8 MG disintegrating tablet Take 1 tablet (8 mg total) by mouth every 8 (eight) hours as needed for nausea or vomiting., Starting Sun 06/02/2016, Print         Azalia BilisKevin Anne Boltz, MD 06/04/16 [phone]

## 2016-06-02 NOTE — ED Triage Notes (Signed)
Pt reports headache that started on Wed. Today pt states she woke with dizziness and fell. Pt c/o sharp pains in her head. Pt also reports arm pain and back pain from the fall.

## 2016-06-04 ENCOUNTER — Encounter (HOSPITAL_COMMUNITY): Payer: Self-pay | Admitting: Emergency Medicine

## 2016-06-04 ENCOUNTER — Observation Stay (HOSPITAL_COMMUNITY)
Admission: EM | Admit: 2016-06-04 | Discharge: 2016-06-05 | Disposition: A | Discharge status: Home / Self Care | Payer: Medicaid Other | Attending: Family Medicine | Admitting: Family Medicine

## 2016-06-04 DIAGNOSIS — R112 Nausea with vomiting, unspecified: Secondary | ICD-10-CM

## 2016-06-04 DIAGNOSIS — R7881 Bacteremia: Secondary | ICD-10-CM | POA: Diagnosis not present

## 2016-06-04 DIAGNOSIS — R197 Diarrhea, unspecified: Secondary | ICD-10-CM | POA: Diagnosis present

## 2016-06-04 DIAGNOSIS — I10 Essential (primary) hypertension: Secondary | ICD-10-CM | POA: Diagnosis not present

## 2016-06-04 DIAGNOSIS — J449 Chronic obstructive pulmonary disease, unspecified: Secondary | ICD-10-CM | POA: Diagnosis not present

## 2016-06-04 DIAGNOSIS — Z79899 Other long term (current) drug therapy: Secondary | ICD-10-CM | POA: Diagnosis not present

## 2016-06-04 DIAGNOSIS — E876 Hypokalemia: Secondary | ICD-10-CM

## 2016-06-04 DIAGNOSIS — N390 Urinary tract infection, site not specified: Secondary | ICD-10-CM | POA: Diagnosis present

## 2016-06-04 DIAGNOSIS — R1013 Epigastric pain: Secondary | ICD-10-CM | POA: Diagnosis not present

## 2016-06-04 DIAGNOSIS — N179 Acute kidney failure, unspecified: Secondary | ICD-10-CM | POA: Diagnosis present

## 2016-06-04 HISTORY — DX: Nausea with vomiting, unspecified: R11.2

## 2016-06-04 HISTORY — DX: Hypokalemia: E87.6

## 2016-06-04 HISTORY — DX: Acute kidney failure, unspecified: N17.9

## 2016-06-04 HISTORY — DX: Diarrhea, unspecified: R19.7

## 2016-06-04 HISTORY — DX: Bacteremia: R78.81

## 2016-06-04 LAB — BLOOD CULTURE ID PANEL (REFLEXED)
Acinetobacter baumannii: NOT DETECTED
CANDIDA KRUSEI: NOT DETECTED
Candida albicans: NOT DETECTED
Candida glabrata: NOT DETECTED
Candida parapsilosis: NOT DETECTED
Candida tropicalis: NOT DETECTED
ENTEROBACTER CLOACAE COMPLEX: NOT DETECTED
ENTEROCOCCUS SPECIES: NOT DETECTED
ESCHERICHIA COLI: NOT DETECTED
Enterobacteriaceae species: NOT DETECTED
Haemophilus influenzae: NOT DETECTED
Klebsiella oxytoca: NOT DETECTED
Klebsiella pneumoniae: NOT DETECTED
LISTERIA MONOCYTOGENES: NOT DETECTED
Neisseria meningitidis: NOT DETECTED
PSEUDOMONAS AERUGINOSA: NOT DETECTED
Proteus species: NOT DETECTED
SERRATIA MARCESCENS: NOT DETECTED
STAPHYLOCOCCUS AUREUS BCID: NOT DETECTED
STREPTOCOCCUS PNEUMONIAE: NOT DETECTED
STREPTOCOCCUS PYOGENES: NOT DETECTED
Staphylococcus species: NOT DETECTED
Streptococcus agalactiae: NOT DETECTED
Streptococcus species: NOT DETECTED

## 2016-06-04 LAB — CBC WITH DIFFERENTIAL/PLATELET
BASOS PCT: 0 %
Basophils Absolute: 0 10*3/uL (ref 0.0–0.1)
EOS ABS: 0 10*3/uL (ref 0.0–0.7)
EOS PCT: 0 %
HCT: 34.3 % — ABNORMAL LOW (ref 36.0–46.0)
HEMOGLOBIN: 11.8 g/dL — AB (ref 12.0–15.0)
Lymphocytes Relative: 10 %
Lymphs Abs: 0.7 10*3/uL (ref 0.7–4.0)
MCH: 29.3 pg (ref 26.0–34.0)
MCHC: 34.4 g/dL (ref 30.0–36.0)
MCV: 85.1 fL (ref 78.0–100.0)
MONO ABS: 0.9 10*3/uL (ref 0.1–1.0)
MONOS PCT: 12 %
NEUTROS PCT: 78 %
Neutro Abs: 6 10*3/uL (ref 1.7–7.7)
PLATELETS: 163 10*3/uL (ref 150–400)
RBC: 4.03 MIL/uL (ref 3.87–5.11)
RDW: 15.8 % — AB (ref 11.5–15.5)
WBC: 7.7 10*3/uL (ref 4.0–10.5)

## 2016-06-04 LAB — CBC
HCT: 31 % — ABNORMAL LOW (ref 36.0–46.0)
HEMOGLOBIN: 10.6 g/dL — AB (ref 12.0–15.0)
MCH: 29.4 pg (ref 26.0–34.0)
MCHC: 34.2 g/dL (ref 30.0–36.0)
MCV: 85.9 fL (ref 78.0–100.0)
PLATELETS: 162 10*3/uL (ref 150–400)
RBC: 3.61 MIL/uL — AB (ref 3.87–5.11)
RDW: 16.1 % — ABNORMAL HIGH (ref 11.5–15.5)
WBC: 6.1 10*3/uL (ref 4.0–10.5)

## 2016-06-04 LAB — URINE CULTURE

## 2016-06-04 LAB — I-STAT BETA HCG BLOOD, ED (MC, WL, AP ONLY): HCG, QUANTITATIVE: 22.6 m[IU]/mL — AB (ref <5)

## 2016-06-04 LAB — URINE MICROSCOPIC-ADD ON

## 2016-06-04 LAB — URINALYSIS, ROUTINE W REFLEX MICROSCOPIC
BILIRUBIN URINE: NEGATIVE
Glucose, UA: NEGATIVE mg/dL
KETONES UR: NEGATIVE mg/dL
NITRITE: NEGATIVE
PH: 6 (ref 5.0–8.0)
Protein, ur: 100 mg/dL — AB
Specific Gravity, Urine: 1.015 (ref 1.005–1.030)

## 2016-06-04 LAB — COMPREHENSIVE METABOLIC PANEL
ALT: 21 U/L (ref 14–54)
ANION GAP: 10 (ref 5–15)
AST: 31 U/L (ref 15–41)
Albumin: 2.7 g/dL — ABNORMAL LOW (ref 3.5–5.0)
Alkaline Phosphatase: 57 U/L (ref 38–126)
BUN: 17 mg/dL (ref 6–20)
CHLORIDE: 105 mmol/L (ref 101–111)
CO2: 19 mmol/L — ABNORMAL LOW (ref 22–32)
Calcium: 8.4 mg/dL — ABNORMAL LOW (ref 8.9–10.3)
Creatinine, Ser: 1.38 mg/dL — ABNORMAL HIGH (ref 0.44–1.00)
GFR, EST AFRICAN AMERICAN: 55 mL/min — AB (ref >60)
GFR, EST NON AFRICAN AMERICAN: 47 mL/min — AB (ref >60)
Glucose, Bld: 105 mg/dL — ABNORMAL HIGH (ref 65–99)
POTASSIUM: 2.6 mmol/L — AB (ref 3.5–5.1)
Sodium: 134 mmol/L — ABNORMAL LOW (ref 135–145)
TOTAL PROTEIN: 6 g/dL — AB (ref 6.5–8.1)
Total Bilirubin: 0.3 mg/dL (ref 0.3–1.2)

## 2016-06-04 LAB — I-STAT CG4 LACTIC ACID, ED: Lactic Acid, Venous: 1.18 mmol/L (ref 0.5–1.9)

## 2016-06-04 LAB — LIPASE, BLOOD

## 2016-06-04 LAB — CREATININE, SERUM
CREATININE: 1.38 mg/dL — AB (ref 0.44–1.00)
GFR calc Af Amer: 55 mL/min — ABNORMAL LOW (ref >60)
GFR, EST NON AFRICAN AMERICAN: 47 mL/min — AB (ref >60)

## 2016-06-04 MED ORDER — SODIUM CHLORIDE 0.9 % IV SOLN
INTRAVENOUS | Status: DC
  Administered 2016-06-04 – 2016-06-05 (×2): via INTRAVENOUS
  Filled 2016-06-04 (×8): qty 1000

## 2016-06-04 MED ORDER — SODIUM CHLORIDE 0.9 % IV BOLUS (SEPSIS)
1000.0000 mL | Freq: Once | INTRAVENOUS | Status: AC
  Administered 2016-06-04: 1000 mL via INTRAVENOUS

## 2016-06-04 MED ORDER — POTASSIUM CHLORIDE IN NACL 40-0.9 MEQ/L-% IV SOLN: INTRAVENOUS | Status: DC

## 2016-06-04 MED ORDER — VANCOMYCIN HCL IN DEXTROSE 750-5 MG/150ML-% IV SOLN
750.0000 mg | Freq: Two times a day (BID) | INTRAVENOUS | Status: DC
  Administered 2016-06-04 – 2016-06-05 (×2): 750 mg via INTRAVENOUS
  Filled 2016-06-04 (×6): qty 150

## 2016-06-04 MED ORDER — ONDANSETRON HCL 4 MG/2ML IJ SOLN
4.0000 mg | Freq: Three times a day (TID) | INTRAMUSCULAR | Status: DC | PRN
  Administered 2016-06-04: 4 mg via INTRAVENOUS
  Filled 2016-06-04: qty 2

## 2016-06-04 MED ORDER — MORPHINE SULFATE (PF) 4 MG/ML IV SOLN
4.0000 mg | Freq: Once | INTRAVENOUS | Status: AC
  Administered 2016-06-04: 4 mg via INTRAVENOUS
  Filled 2016-06-04: qty 1

## 2016-06-04 MED ORDER — POTASSIUM CHLORIDE 10 MEQ/100ML IV SOLN
10.0000 meq | INTRAVENOUS | Status: AC
  Administered 2016-06-04 (×2): 10 meq via INTRAVENOUS
  Filled 2016-06-04 (×2): qty 100

## 2016-06-04 MED ORDER — ALBUTEROL SULFATE (2.5 MG/3ML) 0.083% IN NEBU: 2.5000 mg | INHALATION_SOLUTION | Freq: Four times a day (QID) | RESPIRATORY_TRACT | Status: DC | PRN

## 2016-06-04 MED ORDER — ACETAMINOPHEN 325 MG PO TABS
650.0000 mg | ORAL_TABLET | Freq: Four times a day (QID) | ORAL | Status: DC | PRN
  Filled 2016-06-04: qty 2

## 2016-06-04 MED ORDER — POTASSIUM CHLORIDE CRYS ER 20 MEQ PO TBCR
40.0000 meq | EXTENDED_RELEASE_TABLET | ORAL | Status: AC
  Administered 2016-06-04 (×2): 40 meq via ORAL
  Filled 2016-06-04 (×2): qty 2

## 2016-06-04 MED ORDER — ONDANSETRON HCL 4 MG/2ML IJ SOLN: 4.0000 mg | Freq: Four times a day (QID) | INTRAMUSCULAR | Status: DC | PRN

## 2016-06-04 MED ORDER — ONDANSETRON HCL 4 MG PO TABS: 4.0000 mg | ORAL_TABLET | Freq: Four times a day (QID) | ORAL | Status: DC | PRN

## 2016-06-04 MED ORDER — BUDESONIDE 0.25 MG/2ML IN SUSP
0.2500 mg | Freq: Two times a day (BID) | RESPIRATORY_TRACT | Status: DC
  Administered 2016-06-04 – 2016-06-05 (×2): 0.25 mg via RESPIRATORY_TRACT
  Filled 2016-06-04 (×2): qty 2

## 2016-06-04 MED ORDER — ONDANSETRON HCL 4 MG/2ML IJ SOLN
4.0000 mg | Freq: Once | INTRAMUSCULAR | Status: AC
  Administered 2016-06-04: 4 mg via INTRAVENOUS
  Filled 2016-06-04: qty 2

## 2016-06-04 MED ORDER — GI COCKTAIL ~~LOC~~: 30.0000 mL | Freq: Three times a day (TID) | ORAL | Status: DC | PRN

## 2016-06-04 MED ORDER — PANTOPRAZOLE SODIUM 40 MG PO TBEC
40.0000 mg | DELAYED_RELEASE_TABLET | Freq: Every day | ORAL | Status: DC
  Administered 2016-06-04 – 2016-06-05 (×2): 40 mg via ORAL
  Filled 2016-06-04 (×2): qty 1

## 2016-06-04 MED ORDER — ACETAMINOPHEN 650 MG RE SUPP: 650.0000 mg | Freq: Four times a day (QID) | RECTAL | Status: DC | PRN

## 2016-06-04 MED ORDER — ACETAMINOPHEN 325 MG PO TABS
650.0000 mg | ORAL_TABLET | Freq: Four times a day (QID) | ORAL | Status: DC | PRN
  Administered 2016-06-04 – 2016-06-05 (×4): 650 mg via ORAL
  Filled 2016-06-04 (×3): qty 2

## 2016-06-04 MED ORDER — KETOROLAC TROMETHAMINE 30 MG/ML IJ SOLN
15.0000 mg | Freq: Once | INTRAMUSCULAR | Status: AC
  Administered 2016-06-04: 15 mg via INTRAVENOUS
  Filled 2016-06-04: qty 1

## 2016-06-04 MED ORDER — ENOXAPARIN SODIUM 40 MG/0.4ML ~~LOC~~ SOLN
40.0000 mg | SUBCUTANEOUS | Status: DC
  Administered 2016-06-04: 40 mg via SUBCUTANEOUS
  Filled 2016-06-04: qty 0.4

## 2016-06-04 MED ORDER — VANCOMYCIN HCL IN DEXTROSE 1-5 GM/200ML-% IV SOLN
1000.0000 mg | Freq: Once | INTRAVENOUS | Status: AC
  Administered 2016-06-04: 1000 mg via INTRAVENOUS
  Filled 2016-06-04: qty 200

## 2016-06-04 MED ORDER — ZOLPIDEM TARTRATE 5 MG PO TABS
5.0000 mg | ORAL_TABLET | Freq: Every evening | ORAL | Status: DC | PRN
  Administered 2016-06-04: 5 mg via ORAL
  Filled 2016-06-04: qty 1

## 2016-06-04 MED ORDER — DEXTROSE 5 % IV SOLN
1.0000 g | INTRAVENOUS | Status: DC
  Administered 2016-06-04: 1 g via INTRAVENOUS
  Filled 2016-06-04 (×3): qty 10

## 2016-06-04 NOTE — ED Provider Notes (Signed)
[facility] Provider Note   CSN: [id number] Arrival date & time: 06/04/16  4010     History   Chief Complaint Chief Complaint  Patient presents with  . Follow-up    HPI Norma [name] is a 40 y.o. female  with a past medical history of hypertension, COPD and IBS and depression presenting for further evaluation of a positive blood culture which is obtained at her visit here 2 days ago.  She was seen at that time for headache, lightheadedness and dehydration and also was found to have a UTI and was profoundly hypokalemic.  She felt improved after IV fluids and potassium replacement and went home but returned today due to positive culture 1.  She endorses not feeling much better at this time, still endorsing headache without neck pain or stiffness, she also reports upper abdominal discomfort along with nausea without emesis.  She relates that her husband died 10 days ago and she has been depressed and has had decreased appetite since.  She endorses she last ate solid food one week ago, but has been trying to maintain by mouth fluid intake.  She denies urinary symptoms, fevers or chills at this time.  The history is provided by the patient.    Past Medical History:  Diagnosis Date  . Anxiety   . COPD (chronic obstructive pulmonary disease) (HCC)   . Depression   . History of kidney stones   . Hypertension   . IBS (irritable bowel syndrome)     Patient Active Problem List   Diagnosis Date Noted  . Bacteremia 06/04/2016  . Endometriosis of pelvis 11/16/2015  . 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 Rectal fissure 10/04/2015    Past Surgical History:  Procedure Laterality Date  . ABDOMINAL HYSTERECTOMY N/A 11/09/2015   Procedure: HYSTERECTOMY ABDOMINAL;  Surgeon: Tilda Burrow, MD;  Location: [facility];  Service: Gynecology;  Laterality: N/A;  . DILATION AND CURETTAGE OF UTERUS    . SALPINGOOPHORECTOMY Bilateral  11/09/2015   Procedure: SALPINGO OOPHORECTOMY;  Surgeon: Tilda Burrow, MD;  Location: [facility];  Service: Gynecology;  Laterality: Bilateral;    OB History    Gravida Para Term Preterm AB Living   2 2 2     2    SAB TAB Ectopic Multiple Live Births                   Home Medications    Prior to Admission medications   Medication Sig Start Date End Date Taking? Authorizing Provider  albuterol (PROVENTIL HFA;VENTOLIN HFA) 108 (90 BASE) MCG/ACT inhaler Inhale 2 puffs into the lungs every 6 (six) hours as needed for wheezing.    Historical Provider, MD  albuterol (PROVENTIL) (2.5 MG/3ML) 0.083% nebulizer solution Take 2.5 mg by nebulization every 6 (six) hours as needed for wheezing.    Historical Provider, MD  cephALEXin (KEFLEX) 500 MG capsule Take 1 capsule (500 mg total) by mouth 3 (three) times daily. 06/02/16   Azalia Bilis, MD  estradiol (ESTRACE) 1 MG tablet TAKE 1 TABLET (1 MG TOTAL) BY MOUTH DAILY. Patient not taking: Reported on 06/02/2016 02/12/16   Tilda Burrow, MD  hydrochlorothiazide (HYDRODIURIL) 25 MG tablet Take 25 mg by mouth daily.    Historical Provider, MD  lisinopril (PRINIVIL,ZESTRIL) 10 MG tablet Take 10 mg by mouth daily.    Historical Provider, MD  ondansetron (ZOFRAN ODT) 8 MG disintegrating tablet Take 1 tablet (8 mg total) by mouth every 8 (  eight) hours as needed for nausea or vomiting. 06/02/16   Azalia BilisKevin Campos, MD  potassium chloride (K-DUR) 10 MEQ tablet Take 1 tablet (10 mEq total) by mouth 2 (two) times daily. 06/02/16   Azalia BilisKevin Campos, MD  QVAR 80 MCG/ACT inhaler Inhale 2 puffs into the lungs daily. 09/19/15   Historical Provider, MD    Family History Family History  Problem Relation Age of Onset  . COPD Mother   . Diabetes Father   . Hypertension Father   . Heart disease Sister   . Heart disease Maternal Aunt   . Birth defects Daughter   . Kidney disease Daughter     Social History Social History  Substance Use Topics  . Smoking status: Current  Every Day Smoker    Packs/day: 1.50    Years: 19.00    Types: Cigarettes  . Smokeless tobacco: Never Used  . Alcohol use No     Allergies   Patient has no known allergies.   Review of Systems Review of Systems  Constitutional: Positive for appetite change. Negative for fever.  HENT: Negative for congestion and sore throat.   Eyes: Negative.   Respiratory: Negative for chest tightness and shortness of breath.   Cardiovascular: Negative for chest pain.  Gastrointestinal: Positive for abdominal pain and nausea.  Genitourinary: Negative.   Musculoskeletal: Negative for arthralgias, joint swelling and neck pain.  Skin: Negative.  Negative for rash and wound.  Neurological: Positive for headaches. Negative for dizziness, weakness, light-headedness and numbness.  Psychiatric/Behavioral: Negative.      Physical Exam Updated Vital Signs BP 109/80 (BP Location: Left Arm)   Pulse 98   Temp 99.3 F (37.4 C) (Oral)   Resp 24   Ht 5\' 2"  (1.575 m)   Wt 83.9 kg   LMP 09/24/2015   SpO2 100%   BMI 33.84 kg/m   Physical Exam  Constitutional: She appears well-developed and well-nourished.  HENT:  Head: Normocephalic and atraumatic.  Mouth/Throat: Mucous membranes are dry.  Ttp posterior occipital scalp.    Eyes: Conjunctivae and EOM are normal. Pupils are equal, round, and reactive to light.  Neck: Normal range of motion. Neck supple.  Cardiovascular: Normal rate, regular rhythm, normal heart sounds and intact distal pulses.   Pulmonary/Chest: Effort normal. She has wheezes.  Mild bilateral expiratory wheeze.  Abdominal: Soft. Bowel sounds are normal. She exhibits no mass. There is tenderness. There is no rebound and no guarding.  ttp mid upper abdomen.  Musculoskeletal: Normal range of motion.  Lymphadenopathy:    She has no cervical adenopathy.  Neurological: She is alert.  Skin: Skin is warm and dry.  Psychiatric: She has a normal mood and affect.  Nursing note and vitals  reviewed.    ED Treatments / Results  Labs (all labs ordered are listed, but only abnormal results are displayed) Labs Reviewed  CBC WITH DIFFERENTIAL/PLATELET - Abnormal; Notable for the following:       Result Value   Hemoglobin 11.8 (*)    HCT 34.3 (*)    RDW 15.8 (*)    All other components within normal limits  COMPREHENSIVE METABOLIC PANEL - Abnormal; Notable for the following:    Sodium 134 (*)    Potassium 2.6 (*)    CO2 19 (*)    Glucose, Bld 105 (*)    Creatinine, Ser 1.38 (*)    Calcium 8.4 (*)    Total Protein 6.0 (*)    Albumin 2.7 (*)    GFR calc  non Af Amer 47 (*)    GFR calc Af Amer 55 (*)    All other components within normal limits  I-STAT BETA HCG BLOOD, ED ([facility], [facility], [facility] ONLY) - Abnormal; Notable for the following:    I-stat hCG, quantitative 22.6 (*)    All other components within normal limits  I-STAT CG4 LACTIC ACID, ED    EKG  EKG Interpretation None       Radiology Dg Chest 2 View  Result Date: 06/02/2016 CLINICAL DATA:  Fever. EXAM: CHEST  2 VIEW COMPARISON:  04/01/2016 and prior chest radiographs dating back to 04/07/2009 FINDINGS: The cardiomediastinal silhouette is unremarkable. There is no evidence of focal airspace disease, pulmonary edema, suspicious pulmonary nodule/mass, pleural effusion, or pneumothorax. No acute bony abnormalities are identified. IMPRESSION: No active cardiopulmonary disease. Electronically Signed   By: Harmon PierJeffrey  Hu M.D.   On: 06/02/2016 16:23    Procedures Procedures (including critical care time)  Medications Ordered in ED Medications  vancomycin (VANCOCIN) IVPB 1000 mg/200 mL premix (1,000 mg Intravenous New Bag/Given 06/04/16 0902)  potassium chloride 10 mEq in 100 mL IVPB (not administered)  sodium chloride 0.9 % bolus 1,000 mL (not administered)  sodium chloride 0.9 % bolus 1,000 mL (1,000 mLs Intravenous New Bag/Given 06/04/16 0836)  ondansetron (ZOFRAN) injection 4 mg (4 mg Intravenous Given 06/04/16 0857)   ketorolac (TORADOL) 30 MG/ML injection 15 mg (15 mg Intravenous Given 06/04/16 0857)     Initial Impression / Assessment and Plan / ED Course  I have reviewed the triage vital signs and the nursing notes.  Pertinent labs & imaging results that were available during my care of the patient were reviewed by me and considered in my medical decision making (see chart for details).  Clinical Course      Pt with positive blood culture x 1, still with profound hypokalemia.  Will need admission.  IV fluids given, potassium runs x 2 10 meq each ordered.  Vancomycin pending culture results.  Discussed with Dr. Kerry HoughMemon who agrees with admission, overnight obs.  Temp orders placed.  Final Clinical Impressions(s) / ED Diagnoses   Final diagnoses:  Bacteremia  Hypokalemia    New Prescriptions New Prescriptions   No medications on file     Burgess AmorJulie Semaja Lymon, PA-C 06/04/16 0950    Donnetta HutchingBrian Cook, MD 06/11/16 1146

## 2016-06-04 NOTE — H&P (Addendum)
History and Physical    Norma [name] ZOX:[id number] DOB: March 23, 1976 DOA: 06/04/2016  PCP: Evelene Croon, MD  Patient coming from: home  Chief Complaint: nausea  HPI: Norma [name] is a 40 y.o. female with medical history significant of HTN, COPD, who reports experiencing nausea, diarrhea, fevers since last Thursday. She reports the night before symptom onset, she had eaten at a restaurant. No other family members experienced similar symptoms. She reports having frequent, loose bowel movements daily that do not contain any blood. She has also been having epigastric discomfort. Her po intake has been poor, but she has been trying to keep up with liquids. She has had a mild cough over the last few days. She denies shortness of breath. She has been feeling increasingly weak, dizzy on standing and had a fall on Sunday. She came to the ED on Sunday and was found to be hypokalemic. She was also found to have a temperature of 103F, with associated tachycardia and low blood pressure, that improved with IV fluids. Urinalysis indicated a possible UTI. She was treated with potassium, started on keflex and sent home after cultures were drawn. She reports her symptoms have not improved since that time. She continues to have diarrhea, feels nauseous, complains of headache and is generally weak. She was told to come back to ED today when one of her blood cultures returned positive for GPC in clusters.  ED Course: she continues to have a significant hypokalemia and mild AKI.  Vitals have been stable, with the exception of a low grade temperature of 100.2. She continues to have nausea and diarrhea. She is being admitted for further management.  Review of Systems: As per HPI otherwise 10 point review of systems negative.    Past Medical History:  Diagnosis Date  . AKI (acute kidney injury) (HCC) 06/04/2016  . Anxiety   . COPD (chronic obstructive pulmonary disease) (HCC)   . Depression   . History  of kidney stones   . Hypertension   . IBS (irritable bowel syndrome)     Past Surgical History:  Procedure Laterality Date  . ABDOMINAL HYSTERECTOMY N/A 11/09/2015   Procedure: HYSTERECTOMY ABDOMINAL;  Surgeon: Tilda Burrow, MD;  Location: [facility];  Service: Gynecology;  Laterality: N/A;  . DILATION AND CURETTAGE OF UTERUS    . SALPINGOOPHORECTOMY Bilateral 11/09/2015   Procedure: SALPINGO OOPHORECTOMY;  Surgeon: Tilda Burrow, MD;  Location: [facility];  Service: Gynecology;  Laterality: Bilateral;     reports that she has been smoking Cigarettes.  She has a 28.50 pack-year smoking history. She has never used smokeless tobacco. She reports that she does not drink alcohol or use drugs.  No Known Allergies  Family History  Problem Relation Age of Onset  . COPD Mother   . Diabetes Father   . Hypertension Father   . Heart disease Sister   . Heart disease Maternal Aunt   . Birth defects Daughter   . Kidney disease Daughter      Prior to Admission medications   Medication Sig Start Date End Date Taking? Authorizing Provider  albuterol (PROVENTIL HFA;VENTOLIN HFA) 108 (90 BASE) MCG/ACT inhaler Inhale 2 puffs into the lungs every 6 (six) hours as needed for wheezing.   Yes Historical Provider, MD  albuterol (PROVENTIL) (2.5 MG/3ML) 0.083% nebulizer solution Take 2.5 mg by nebulization every 6 (six) hours as needed for wheezing.   Yes Historical Provider, MD  Aspirin-Acetaminophen-Caffeine (GOODY HEADACHE PO) Take 1 Package by mouth daily as  needed (pain).   Yes Historical Provider, MD  cephALEXin (KEFLEX) 500 MG capsule Take 1 capsule (500 mg total) by mouth 3 (three) times daily. 06/02/16  Yes Azalia BilisKevin Campos, MD  hydrochlorothiazide (HYDRODIURIL) 25 MG tablet Take 25 mg by mouth daily as needed (high blood pressure).    Yes Historical Provider, MD  ibuprofen (ADVIL,MOTRIN) 200 MG tablet Take 800 mg by mouth every 6 (six) hours as needed for moderate pain.   Yes Historical Provider, MD    lisinopril (PRINIVIL,ZESTRIL) 10 MG tablet Take 10 mg by mouth daily as needed (high blood pressure).    Yes Historical Provider, MD  ondansetron (ZOFRAN ODT) 8 MG disintegrating tablet Take 1 tablet (8 mg total) by mouth every 8 (eight) hours as needed for nausea or vomiting. 06/02/16  Yes Azalia BilisKevin Campos, MD  potassium chloride (K-DUR) 10 MEQ tablet Take 1 tablet (10 mEq total) by mouth 2 (two) times daily. 06/02/16  Yes Azalia BilisKevin Campos, MD  QVAR 80 MCG/ACT inhaler Inhale 2 puffs into the lungs daily. 09/19/15  Yes Historical Provider, MD  estradiol (ESTRACE) 1 MG tablet TAKE 1 TABLET (1 MG TOTAL) BY MOUTH DAILY. Patient not taking: Reported on 06/02/2016 02/12/16   Tilda BurrowJohn V Ferguson, MD    Physical Exam: Vitals:   06/04/16 0824 06/04/16 1051 06/04/16 1100 06/04/16 1138  BP: 109/80 121/78 119/86 127/79  Pulse:  98 84 93  Resp:  20 19 20   Temp:    100.2 F (37.9 C)  TempSrc:    Oral  SpO2:  98% 99% 100%  Weight:    90.9 kg (200 lb 6.4 oz)  Height:    5\' 2"  (1.575 m)      Constitutional: NAD, calm, comfortable Vitals:   06/04/16 0824 06/04/16 1051 06/04/16 1100 06/04/16 1138  BP: 109/80 121/78 119/86 127/79  Pulse:  98 84 93  Resp:  20 19 20   Temp:    100.2 F (37.9 C)  TempSrc:    Oral  SpO2:  98% 99% 100%  Weight:    90.9 kg (200 lb 6.4 oz)  Height:    5\' 2"  (1.575 m)   Eyes: PERRL, lids and conjunctivae normal ENMT: Mucous membranes are moist. Posterior pharynx clear of any exudate or lesions.Normal dentition.  Neck: normal, supple, no masses, no thyromegaly Respiratory: clear to auscultation bilaterally, no wheezing, no crackles. Normal respiratory effort. No accessory muscle use.  Cardiovascular: Regular rate and rhythm, no murmurs / rubs / gallops. No extremity edema. 2+ pedal pulses. No carotid bruits.  Abdomen: no tenderness, no masses palpated. No hepatosplenomegaly. Bowel sounds positive.  Musculoskeletal: no clubbing / cyanosis. No joint deformity upper and lower  extremities. Good ROM, no contractures. Normal muscle tone.  Skin: small nodule under scalp without signs of infection Neurologic: CN 2-12 grossly intact. Sensation intact, DTR normal. Strength 5/5 in all 4.  Psychiatric: Normal judgment and insight. Alert and oriented x 3. Normal mood.   Labs on Admission: I have personally reviewed following labs and imaging studies  CBC:  Recent Labs Lab 06/02/16 1539 06/04/16 0836  WBC 10.1 7.7  NEUTROABS 7.9* 6.0  HGB 12.5 11.8*  HCT 36.6 34.3*  MCV 85.9 85.1  PLT 179 163   Basic Metabolic Panel:  Recent Labs Lab 06/02/16 1539 06/04/16 0836  NA 133* 134*  K 2.5* 2.6*  CL 101 105  CO2 21* 19*  GLUCOSE 123* 105*  BUN 19 17  CREATININE 1.35* 1.38*  CALCIUM 8.7* 8.4*   GFR: Estimated Creatinine  Clearance: 57.4 mL/min (by C-G formula based on SCr of 1.38 mg/dL (H)). Liver Function Tests:  Recent Labs Lab 06/02/16 1539 06/04/16 0836  AST 19 31  ALT 12* 21  ALKPHOS 51 57  BILITOT 0.5 0.3  PROT 6.6 6.0*  ALBUMIN 3.2* 2.7*   No results for input(s): LIPASE, AMYLASE in the last 168 hours. No results for input(s): AMMONIA in the last 168 hours. Coagulation Profile: No results for input(s): INR, PROTIME in the last 168 hours. Cardiac Enzymes: No results for input(s): CKTOTAL, CKMB, CKMBINDEX, TROPONINI in the last 168 hours. BNP (last 3 results) No results for input(s): PROBNP in the last 8760 hours. HbA1C: No results for input(s): HGBA1C in the last 72 hours. CBG: No results for input(s): GLUCAP in the last 168 hours. Lipid Profile: No results for input(s): CHOL, HDL, LDLCALC, TRIG, CHOLHDL, LDLDIRECT in the last 72 hours. Thyroid Function Tests: No results for input(s): TSH, T4TOTAL, FREET4, T3FREE, THYROIDAB in the last 72 hours. Anemia Panel: No results for input(s): VITAMINB12, FOLATE, FERRITIN, TIBC, IRON, RETICCTPCT in the last 72 hours. Urine analysis:    Component Value Date/Time   COLORURINE YELLOW 06/02/2016  1720   APPEARANCEUR CLOUDY (A) 06/02/2016 1720   LABSPEC 1.025 06/02/2016 1720   PHURINE 6.0 06/02/2016 1720   GLUCOSEU NEGATIVE 06/02/2016 1720   HGBUR LARGE (A) 06/02/2016 1720   BILIRUBINUR SMALL (A) 06/02/2016 1720   KETONESUR NEGATIVE 06/02/2016 1720   PROTEINUR 100 (A) 06/02/2016 1720   UROBILINOGEN 0.2 07/04/2014 1210   NITRITE NEGATIVE 06/02/2016 1720   LEUKOCYTESUR SMALL (A) 06/02/2016 1720   Sepsis Labs: !!!!!!!!!!!!!!!!!!!!!!!!!!!!!!!!!!!!!!!!!!!! @LABRCNTIP (procalcitonin:4,lacticidven:4) ) Recent Results (from the past 240 hour(s))  Culture, blood (Routine x 2)     Status: None (Preliminary result)   Collection Time: 06/02/16  3:40 PM  Result Value Ref Range Status   Specimen Description BLOOD RIGHT ARM DRAWN BY IV THERAPY  Final   Special Requests BOTTLES DRAWN AEROBIC AND ANAEROBIC 6CC  Final   Culture  Setup Time   Final    GRAM POSITIVE COCCI IN CLUSTERS AEROBIC BOTTLE Gram Stain Report Called to,Read Back By and Verified With: VICK C AT 0445 ON [id number] BY FORSYTH K AT [facility] Organism ID to follow CRITICAL RESULT CALLED TO, READ BACK BY AND VERIFIED WITH: R. MINTER, ER NURSE AT [phone] ON [id number] BY Lucienne Capers    Culture   Final    TOO YOUNG TO READ Performed at [facility]    Report Status PENDING  Incomplete  Culture, blood (Routine x 2)     Status: None (Preliminary result)   Collection Time: 06/02/16  3:40 PM  Result Value Ref Range Status   Specimen Description BLOOD BLOOD RIGHT FOREARM IV  Final   Special Requests BOTTLES DRAWN AEROBIC AND ANAEROBIC 6CC  Final   Culture NO GROWTH 2 DAYS  Final   Report Status PENDING  Incomplete  Blood Culture ID Panel (Reflexed)     Status: None   Collection Time: 06/02/16  3:40 PM  Result Value Ref Range Status   Enterococcus species NOT DETECTED NOT DETECTED Final   Listeria monocytogenes NOT DETECTED NOT DETECTED Final   Staphylococcus species NOT DETECTED NOT DETECTED Final   Staphylococcus aureus NOT  DETECTED NOT DETECTED Final   Streptococcus species NOT DETECTED NOT DETECTED Final   Streptococcus agalactiae NOT DETECTED NOT DETECTED Final   Streptococcus pneumoniae NOT DETECTED NOT DETECTED Final   Streptococcus pyogenes NOT DETECTED NOT DETECTED Final   Acinetobacter  baumannii NOT DETECTED NOT DETECTED Final   Enterobacteriaceae species NOT DETECTED NOT DETECTED Final   Enterobacter cloacae complex NOT DETECTED NOT DETECTED Final   Escherichia coli NOT DETECTED NOT DETECTED Final   Klebsiella oxytoca NOT DETECTED NOT DETECTED Final   Klebsiella pneumoniae NOT DETECTED NOT DETECTED Final   Proteus species NOT DETECTED NOT DETECTED Final   Serratia marcescens NOT DETECTED NOT DETECTED Final   Haemophilus influenzae NOT DETECTED NOT DETECTED Final   Neisseria meningitidis NOT DETECTED NOT DETECTED Final   Pseudomonas aeruginosa NOT DETECTED NOT DETECTED Final   Candida albicans NOT DETECTED NOT DETECTED Final   Candida glabrata NOT DETECTED NOT DETECTED Final   Candida krusei NOT DETECTED NOT DETECTED Final   Candida parapsilosis NOT DETECTED NOT DETECTED Final   Candida tropicalis NOT DETECTED NOT DETECTED Final    Comment: Performed at [facility]  Urine culture     Status: Abnormal   Collection Time: 06/02/16  5:20 PM  Result Value Ref Range Status   Specimen Description URINE, CLEAN CATCH  Final   Special Requests NONE  Final   Culture MULTIPLE SPECIES PRESENT, SUGGEST RECOLLECTION (A)  Final   Report Status 06/04/2016 FINAL  Final     Radiological Exams on Admission: Dg Chest 2 View  Result Date: 06/02/2016 CLINICAL DATA:  Fever. EXAM: CHEST  2 VIEW COMPARISON:  04/01/2016 and prior chest radiographs dating back to 04/07/2009 FINDINGS: The cardiomediastinal silhouette is unremarkable. There is no evidence of focal airspace disease, pulmonary edema, suspicious pulmonary nodule/mass, pleural effusion, or pneumothorax. No acute bony abnormalities are identified.  IMPRESSION: No active cardiopulmonary disease. Electronically Signed   By: Harmon Pier M.D.   On: 06/02/2016 16:23    EKG: Independently reviewed. Sinus rhythm without acute changes  Assessment/Plan Active Problems:   Bacteremia   UTI (urinary tract infection)   Abdominal pain, epigastric   Nausea and vomiting   Hypokalemia   AKI (acute kidney injury) (HCC)   Positive blood culture   Diarrhea   1. Hypokalemia. Likely related to GI losses/diarrhea. Will replace and check magnesium  2. Diarrhea. Possibly viral gastroenteritis in nature. She is having low-grade temperatures. We will check stool for C. Difficile.  3. Possible UTI. Patient was seen on 11/19 and found to have a urinalysis indicating possible infection. Urine culture from that day showed multiple organisms. Will recollect. Start on Rocephin for now.  4. AKI. Likely related to dehydration. Continue IV fluids and recheck in a.m .  5. Positive blood culture. Patient has 1 out of 2 positive blood cultures were gram-positive cocci in clusters. This may very well be coagulase-negative staph and a contaminant. Blood cultures will be repeated. If results turn out to be MRSA, she will need vancomycin and further workup. She does not appear septic or toxic at this time.  6. Epigastric abdominal pain. Suspect this is related to gastroenteritis. Start the patient on PPI and GI cocktail as needed. The liver function tests are normal range. Check lipase. Check right upper quadrant ultrasound.   DVT prophylaxis: lovenox Code Status: full code Family Communication: discussed with patient Disposition Plan: discharge home once improved Consults called:  Admission status: observation/telemetry   Akhilesh Sassone MD [facility] Pager [phone]  If 7PM-7AM, please contact night-coverage [email or web address] Password [facility]  06/04/2016, 3:47 PM   Addendum 17:00:  Informed by staff the patient continues to have fever with  temperature greater than 101. We'll add vancomycin for now until blood cultures are resulted.  Patient has a mildly positive hCG. On 11/19 hCG was noted to be 8, today repeat level is noted to be 22. This was discussed with Dr. Emelda FearFerguson who recommended repeat hCG in 48 hours. If it is continuing to rise, she will need a formal GYN consult. If the level has decreased, no further workup is necessary. Of note, patient has already had a total abdominal hysterectomy.  Janise Gora

## 2016-06-04 NOTE — Progress Notes (Signed)
Pharmacy Antibiotic Note  Norma [name] is a 40 y.o. female admitted on 06/04/2016 with bacteremia. She had 1/2 blood cultures from 11/19 that grew GPC.  Pharmacy has been consulted for vancomycin dosing. She has also started rocephin and she was on keflex prior to admission.  Vanc 1 gm given earlier today.  Plan: Cont vanc 750 mg IV q12 hours F/u renal function, cultures and clinical course  Height: 5\' 2"  (157.5 cm) Weight: 200 lb 6.4 oz (90.9 kg) IBW/kg (Calculated) : 50.1  Temp (24hrs), Avg:100.3 F (37.9 C), Min:99.3 F (37.4 C), Max:101.3 F (38.5 C)   Recent Labs Lab 06/02/16 1539 06/02/16 1546 06/04/16 0836 06/04/16 0846  WBC 10.1  --  7.7  --   CREATININE 1.35*  --  1.38*  --   LATICACIDVEN  --  1.41  --  1.18    Estimated Creatinine Clearance: 57.4 mL/min (by C-G formula based on SCr of 1.38 mg/dL (H)).    No Known Allergies  Antimicrobials this admission: vanc 11/21 >>  rocephin 11/21 >>    Microbiology results: 11/19 BCx: 1/2 gpc, BCID was negative for any organisms tested  Thank you for allowing pharmacy to be a part of this patient's care.  Talbert CageSeay, Briseyda Fehr Poteet 06/04/2016 5:03 PM

## 2016-06-04 NOTE — ED Triage Notes (Signed)
Pt was seen in ED on Sunday after falling due to dehydration.  Pt had nausea and headache and epigastric pain.  Pt reports she was called and told to come back to hospital because there was bacteria in her blood.  Pt continues to have h/a and epigastric pain. Pt alert and oriented.

## 2016-06-04 NOTE — ED Notes (Signed)
CRITICAL VALUE ALERT  Critical value received:  Potassium 2.6  Date of notification:  06/04/16  Time of notification:  0905  Critical value read back:Yes.    Nurse who received alert:  RMinter, RN  MD notified (1st page):  J. Idol  Time of first page:  0905  MD notified (2nd page):  Time of second page:  Responding MD:  Leona SingletonJ. Idol  Time MD responded:  [phone]

## 2016-06-04 NOTE — ED Notes (Signed)
Pt reports has had headache, fever, n/v since last week.  Reports was seen here Sunday and given Keflex for a UTI.  Pt was called at home today because blood culture was positive. Today pt c/o epigastric pain as well.  Leona SingletonJ. Idol PA assessing pt.

## 2016-06-04 NOTE — ED Notes (Signed)
Blood cultures reported to Dr. Adriana Simasook and was instructed to have pt come back in for IV antibiotics and admission.  Notified pt, she is on the way.

## 2016-06-05 ENCOUNTER — Observation Stay (HOSPITAL_COMMUNITY): Payer: Medicaid Other

## 2016-06-05 DIAGNOSIS — N179 Acute kidney failure, unspecified: Secondary | ICD-10-CM | POA: Diagnosis not present

## 2016-06-05 DIAGNOSIS — R112 Nausea with vomiting, unspecified: Secondary | ICD-10-CM | POA: Diagnosis not present

## 2016-06-05 DIAGNOSIS — R1013 Epigastric pain: Secondary | ICD-10-CM | POA: Diagnosis not present

## 2016-06-05 DIAGNOSIS — R7881 Bacteremia: Secondary | ICD-10-CM | POA: Diagnosis not present

## 2016-06-05 LAB — CBC
HCT: 28.4 % — ABNORMAL LOW (ref 36.0–46.0)
HEMOGLOBIN: 9.5 g/dL — AB (ref 12.0–15.0)
MCH: 28.8 pg (ref 26.0–34.0)
MCHC: 33.5 g/dL (ref 30.0–36.0)
MCV: 86.1 fL (ref 78.0–100.0)
PLATELETS: 161 10*3/uL (ref 150–400)
RBC: 3.3 MIL/uL — AB (ref 3.87–5.11)
RDW: 16.4 % — ABNORMAL HIGH (ref 11.5–15.5)
WBC: 5.6 10*3/uL (ref 4.0–10.5)

## 2016-06-05 LAB — COMPREHENSIVE METABOLIC PANEL
ALT: 20 U/L (ref 14–54)
ANION GAP: 5 (ref 5–15)
AST: 25 U/L (ref 15–41)
Albumin: 2.1 g/dL — ABNORMAL LOW (ref 3.5–5.0)
Alkaline Phosphatase: 50 U/L (ref 38–126)
BILIRUBIN TOTAL: 0.5 mg/dL (ref 0.3–1.2)
BUN: 14 mg/dL (ref 6–20)
CHLORIDE: 111 mmol/L (ref 101–111)
CO2: 19 mmol/L — ABNORMAL LOW (ref 22–32)
Calcium: 8 mg/dL — ABNORMAL LOW (ref 8.9–10.3)
Creatinine, Ser: 1.32 mg/dL — ABNORMAL HIGH (ref 0.44–1.00)
GFR calc Af Amer: 58 mL/min — ABNORMAL LOW (ref >60)
GFR, EST NON AFRICAN AMERICAN: 50 mL/min — AB (ref >60)
Glucose, Bld: 94 mg/dL (ref 65–99)
POTASSIUM: 4 mmol/L (ref 3.5–5.1)
Sodium: 135 mmol/L (ref 135–145)
TOTAL PROTEIN: 4.9 g/dL — AB (ref 6.5–8.1)

## 2016-06-05 LAB — CULTURE, BLOOD (ROUTINE X 2)

## 2016-06-05 LAB — MAGNESIUM: MAGNESIUM: 1.5 mg/dL — AB (ref 1.7–2.4)

## 2016-06-05 MED ORDER — POTASSIUM CHLORIDE IN NACL 40-0.9 MEQ/L-% IV SOLN
INTRAVENOUS | Status: AC
Start: 2016-06-05 — End: 2016-06-05
  Filled 2016-06-05: qty 1000

## 2016-06-05 MED ORDER — DEXTROSE 5 % IV SOLN
2.0000 g | INTRAVENOUS | Status: DC
  Filled 2016-06-05 (×2): qty 2

## 2016-06-05 MED ORDER — MAGNESIUM SULFATE 2 GM/50ML IV SOLN
2.0000 g | Freq: Once | INTRAVENOUS | Status: AC
  Administered 2016-06-05: 2 g via INTRAVENOUS
  Filled 2016-06-05 (×2): qty 50

## 2016-06-05 MED ORDER — VANCOMYCIN HCL IN DEXTROSE 1-5 GM/200ML-% IV SOLN
1000.0000 mg | Freq: Two times a day (BID) | INTRAVENOUS | Status: DC
Start: 2016-06-05 — End: 2016-06-05

## 2016-06-05 MED ORDER — VANCOMYCIN HCL IN DEXTROSE 750-5 MG/150ML-% IV SOLN
INTRAVENOUS | Status: AC
Start: 2016-06-05 — End: 2016-06-05
  Filled 2016-06-05: qty 150

## 2016-06-05 NOTE — Discharge Summary (Signed)
Physician Discharge Summary  Norma [name] ZOX:[id number] DOB: 01/17/1976 DOA: 06/04/2016  PCP: Evelene CroonNIEMEYER, MEINDERT, MD  Admit date: 06/04/2016 Discharge date: 06/05/2016  Recommendations for Outpatient Follow-up:  1. Follow-up repeat blood cultures 2. Follow-up elevated. hCG. Recommend repeat within the next week. If elevated, recommend GYN referral. Patient is aware of this elevated level. 3. Follow-up normocytic anemia of unclear etiology. 4. Follow-up resolution of UTI  Follow-up Information    Evelene CroonNIEMEYER, MEINDERT, MD. Schedule an appointment as soon as possible for a visit in 1 week(s).   Specialty:  Family Medicine Contact information: [facility] [place] [id number] [phone]          Discharge Diagnoses:  1. Febrile illness, likely related to gastroenteritis 2. Spurious bacteremia (contaminant, not true bacteremia) 3. Epigastric abdominal pain, resolved. Suspected gastritis/gastroenteritis. 4. Nausea, vomiting, diarrhea. 5. Hypokalemia 6. Hypomagnesemia 7. Quantitative hCG positive 8. Normocytic anemia 9. Acute kidney injury 10. Possible UTI on admission  Discharge Condition: improved Disposition: home  Diet recommendation: regular  Filed Weights   06/04/16 0823 06/04/16 1138  Weight: 83.9 kg (185 lb) 90.9 kg (200 lb 6.4 oz)    History of present illness:  40 year old woman seen in ED 11/19 for fever, headache, nausea, vomiting. Treated for hypokalemia and UTI and discharged. Blood cultures obtained at that time eventually 1/2 positive for bacteria and patient was referred back to the emergency department for further evaluation. Reported diarrhea, nausea, epigastric discomfort, poor oral intake  Hospital Course:  Patient was treated with empiric antibiotics. Now afebrile greater than 24 hours. Repeat blood cultures no growth today. Initial blood culture revealed micrococcus 1/2 bottles. Discussed with infectious disease, felt to be contaminant.  Symptomatology most suggestive of viral gastroenteritis. She has no history of catheter insertion or foreign body and there is no reason to suspect true bacteremia at this point. No indication for further antibiotics. However she can complete the antibiotic she was taking for UTI prior to admission. She was counseled extensively on signs and symptoms to look for and appears to be reliable. She will return if she develops further fever or further symptoms. Hospitalization was uncomplicated. Individual issues as below.  1. Fever, resolved nearly 24 hours. Completely asymptomatic. Blood culture 1/2 micrococcus consistent with contaminant. Discussed with infectious disease. No further evaluation suggested. Repeat blood cultures pending. 2. Epigastric abdominal pain. Completely resolved. Given recent nausea, vomiting, and diarrhea suspect gastritis. LFTs within normal limits. Lipase not elevated. Right upper quadrant ultrasound, no gallstones seen. Small gallbladder sludge. Normal common bile duct. 3. Nausea, vomiting, diarrhea, resolved. 4. Hypokalemia. Secondary to GI losses, diarrhea, poor oral intake. Resolved. 5. Hypomagnesemia. Replete it. 6. Quantitative hCG positive . Patient has had a hysterectomy, the significance of this is unclear. I discussed this with her and recommended repeat testing in the outpatient setting and further workup if remains elevated. 7. Normocytic anemia of unclear etiology. Likely trending down secondary to hydration. Completely asymptomatic. Follow-up as an outpatient. 8. AKI. Secondary to poor oral intake and dehydration. Improving. 9. Possible UTI on admission. Urine culture previously multiple organisms none. Was taking antibiotics at home. predominant. 10. Husband died approximately 10 days ago  Antimicrobials:  Ceftriaxone 11/21 >> 11/22  Vancomycin 11/21 >> 11/22 Discharge Instructions  Discharge Instructions    Diet general    Complete by:  As directed     Discharge instructions    Complete by:  As directed    Call your physician or seek immediate medical attention for fever, shortness of breath,  vomiting, diarrhea or worsening of condition.   Increase activity slowly    Complete by:  As directed        Medication List    STOP taking these medications   ibuprofen 200 MG tablet Commonly known as:  ADVIL,MOTRIN   ondansetron 8 MG disintegrating tablet Commonly known as:  ZOFRAN ODT     TAKE these medications   albuterol (2.5 MG/3ML) 0.083% nebulizer solution Commonly known as:  PROVENTIL Take 2.5 mg by nebulization every 6 (six) hours as needed for wheezing.   albuterol 108 (90 Base) MCG/ACT inhaler Commonly known as:  PROVENTIL HFA;VENTOLIN HFA Inhale 2 puffs into the lungs every 6 (six) hours as needed for wheezing.   cephALEXin 500 MG capsule Commonly known as:  KEFLEX Take 1 capsule (500 mg total) by mouth 3 (three) times daily.   estradiol 1 MG tablet Commonly known as:  ESTRACE TAKE 1 TABLET (1 MG TOTAL) BY MOUTH DAILY.   GOODY HEADACHE PO Take 1 Package by mouth daily as needed (pain).   hydrochlorothiazide 25 MG tablet Commonly known as:  HYDRODIURIL Take 25 mg by mouth daily as needed (high blood pressure).   lisinopril 10 MG tablet Commonly known as:  PRINIVIL,ZESTRIL Take 10 mg by mouth daily as needed (high blood pressure).   potassium chloride 10 MEQ tablet Commonly known as:  K-DUR Take 1 tablet (10 mEq total) by mouth 2 (two) times daily.   QVAR 80 MCG/ACT inhaler Generic drug:  beclomethasone Inhale 2 puffs into the lungs daily.      No Known Allergies  The results of significant diagnostics from this hospitalization (including imaging, microbiology, ancillary and laboratory) are listed below for reference.    Significant Diagnostic Studies: Dg Chest 2 View  Result Date: 06/02/2016 CLINICAL DATA:  Fever. EXAM: CHEST  2 VIEW COMPARISON:  04/01/2016 and prior chest radiographs dating back to  04/07/2009 FINDINGS: The cardiomediastinal silhouette is unremarkable. There is no evidence of focal airspace disease, pulmonary edema, suspicious pulmonary nodule/mass, pleural effusion, or pneumothorax. No acute bony abnormalities are identified. IMPRESSION: No active cardiopulmonary disease. Electronically Signed   By: Harmon PierJeffrey  Hu M.D.   On: 06/02/2016 16:23   [place] Abdomen Limited Ruq  Result Date: 06/05/2016 CLINICAL DATA:  Epigastric pain EXAM: US ABDOMEN LIMITED - RIGHT UPPER QUADRANT COMPARISON:  CT scan 11/11/2015 FINDINGS: Gallbladder: No shadowing gallstones are noted within gallbladder. Small sludge in dependent gallbladder. No thickening of gallbladder wall. No sonographic Murphy's sign. Common bile duct: Diameter: 3.6 mm in diameter within normal limits Liver: No focal lesion identified. Within normal limits in parenchymal echogenicity. Suboptimal exam due to patient's large body habitus. IMPRESSION: 1. No shadowing gallstones. Small gallbladder sludge. Normal CBD. No focal hepatic mass. Electronically Signed   By: Natasha MeadLiviu  Pop M.D.   On: 06/05/2016 08:41    Microbiology: Recent Results (from the past 240 hour(s))  Culture, blood (Routine x 2)     Status: Abnormal   Collection Time: 06/02/16  3:40 PM  Result Value Ref Range Status   Specimen Description BLOOD RIGHT ARM DRAWN BY IV THERAPY  Final   Special Requests BOTTLES DRAWN AEROBIC AND ANAEROBIC 6CC  Final   Culture  Setup Time   Final    GRAM POSITIVE COCCI IN CLUSTERS AEROBIC BOTTLE Gram Stain Report Called to,Read Back By and Verified With: VICK C AT 0445 ON [id number] BY FORSYTH K AT [facility] CRITICAL RESULT CALLED TO, READ BACK BY AND VERIFIED WITH: R. MINTER, ER NURSE AT  1610 ON [id number] BY Lucienne Capers    Culture (A)  Final    MICROCOCCUS SPECIES Standardized susceptibility testing for this organism is not available. Performed at [facility]    Report Status 06/05/2016 FINAL  Final  Culture, blood (Routine x 2)     Status:  None (Preliminary result)   Collection Time: 06/02/16  3:40 PM  Result Value Ref Range Status   Specimen Description BLOOD BLOOD RIGHT FOREARM IV  Final   Special Requests BOTTLES DRAWN AEROBIC AND ANAEROBIC 6CC  Final   Culture NO GROWTH 3 DAYS  Final   Report Status PENDING  Incomplete  Blood Culture ID Panel (Reflexed)     Status: None   Collection Time: 06/02/16  3:40 PM  Result Value Ref Range Status   Enterococcus species NOT DETECTED NOT DETECTED Final   Listeria monocytogenes NOT DETECTED NOT DETECTED Final   Staphylococcus species NOT DETECTED NOT DETECTED Final   Staphylococcus aureus NOT DETECTED NOT DETECTED Final   Streptococcus species NOT DETECTED NOT DETECTED Final   Streptococcus agalactiae NOT DETECTED NOT DETECTED Final   Streptococcus pneumoniae NOT DETECTED NOT DETECTED Final   Streptococcus pyogenes NOT DETECTED NOT DETECTED Final   Acinetobacter baumannii NOT DETECTED NOT DETECTED Final   Enterobacteriaceae species NOT DETECTED NOT DETECTED Final   Enterobacter cloacae complex NOT DETECTED NOT DETECTED Final   Escherichia coli NOT DETECTED NOT DETECTED Final   Klebsiella oxytoca NOT DETECTED NOT DETECTED Final   Klebsiella pneumoniae NOT DETECTED NOT DETECTED Final   Proteus species NOT DETECTED NOT DETECTED Final   Serratia marcescens NOT DETECTED NOT DETECTED Final   Haemophilus influenzae NOT DETECTED NOT DETECTED Final   Neisseria meningitidis NOT DETECTED NOT DETECTED Final   Pseudomonas aeruginosa NOT DETECTED NOT DETECTED Final   Candida albicans NOT DETECTED NOT DETECTED Final   Candida glabrata NOT DETECTED NOT DETECTED Final   Candida krusei NOT DETECTED NOT DETECTED Final   Candida parapsilosis NOT DETECTED NOT DETECTED Final   Candida tropicalis NOT DETECTED NOT DETECTED Final    Comment: Performed at [facility]  Urine culture     Status: Abnormal   Collection Time: 06/02/16  5:20 PM  Result Value Ref Range Status   Specimen  Description URINE, CLEAN CATCH  Final   Special Requests NONE  Final   Culture MULTIPLE SPECIES PRESENT, SUGGEST RECOLLECTION (A)  Final   Report Status 06/04/2016 FINAL  Final  Culture, blood (routine x 2)     Status: None (Preliminary result)   Collection Time: 06/04/16  4:09 PM  Result Value Ref Range Status   Specimen Description BLOOD RIGHT ARM  Final   Special Requests   Final    BOTTLES DRAWN AEROBIC AND ANAEROBIC AEB 10CC ANA 8CC   Culture NO GROWTH < 24 HOURS  Final   Report Status PENDING  Incomplete  Culture, blood (routine x 2)     Status: None (Preliminary result)   Collection Time: 06/04/16  4:20 PM  Result Value Ref Range Status   Specimen Description BLOOD RIGHT ARM  Final   Special Requests BOTTLES DRAWN AEROBIC AND ANAEROBIC [facility] EACH  Final   Culture NO GROWTH < 24 HOURS  Final   Report Status PENDING  Incomplete     Labs: Basic Metabolic Panel:  Recent Labs Lab 06/02/16 1539 06/04/16 0836 06/04/16 1609 06/05/16 0608  NA 133* 134*  --  135  K 2.5* 2.6*  --  4.0  CL 101 105  --  111  CO2 21* 19*  --  19*  GLUCOSE 123* 105*  --  94  BUN 19 17  --  14  CREATININE 1.35* 1.38* 1.38* 1.32*  CALCIUM 8.7* 8.4*  --  8.0*  MG  --   --   --  1.5*   Liver Function Tests:  Recent Labs Lab 06/02/16 1539 06/04/16 0836 06/05/16 0608  AST 19 31 25   ALT 12* 21 20  ALKPHOS 51 57 50  BILITOT 0.5 0.3 0.5  PROT 6.6 6.0* 4.9*  ALBUMIN 3.2* 2.7* 2.1*    Recent Labs Lab 06/04/16 1609  LIPASE <10*   CBC:  Recent Labs Lab 06/02/16 1539 06/04/16 0836 06/04/16 1609 06/05/16 0608  WBC 10.1 7.7 6.1 5.6  NEUTROABS 7.9* 6.0  --   --   HGB 12.5 11.8* 10.6* 9.5*  HCT 36.6 34.3* 31.0* 28.4*  MCV 85.9 85.1 85.9 86.1  PLT 179 163 162 161    Active Problems:   Bacteremia   UTI (urinary tract infection)   Abdominal pain, epigastric   Nausea and vomiting   Hypokalemia   AKI (acute kidney injury) (HCC)   Positive blood culture   Diarrhea   Time  coordinating discharge: 35 minutes  Signed:  Brendia Sacks, MD [facility] 06/05/2016, 6:45 PM

## 2016-06-05 NOTE — Progress Notes (Signed)
Pharmacy Antibiotic Note  Norma [name] is a 40 y.o. female admitted on 06/04/2016 with bacteremia. She had 1/2 blood cultures from 11/19 that grew GPC.  Pharmacy has been consulted for vancomycin dosing. She has also started rocephin and she was on keflex prior to admission.   Plan: Vancomycin 1000mg  IV q12 hours Rocephin 2gm IV q24hrs Check Vancomycin trough level at steady state F/u renal function, cultures and clinical course  Height: 5\' 2"  (157.5 cm) Weight: 200 lb 6.4 oz (90.9 kg) IBW/kg (Calculated) : 50.1  Temp (24hrs), Avg:99 F (37.2 C), Min:97.5 F (36.4 C), Max:101.3 F (38.5 C)   Recent Labs Lab 06/02/16 1539 06/02/16 1546 06/04/16 0836 06/04/16 0846 06/04/16 1609 06/05/16 0608  WBC 10.1  --  7.7  --  6.1 5.6  CREATININE 1.35*  --  1.38*  --  1.38* 1.32*  LATICACIDVEN  --  1.41  --  1.18  --   --     Estimated Creatinine Clearance: 60 mL/min (by C-G formula based on SCr of 1.32 mg/dL (H)).    No Known Allergies  Antimicrobials this admission: vanc 11/21 >>  rocephin 11/21 >>   Microbiology results: 11/19 BCx: 1/2 gpc, BCID was negative for any organisms tested  Thank you for allowing pharmacy to be a part of this patient's care.  Norma [name], PharmD Clinical Pharmacist Pager:  [phone] 06/05/2016

## 2016-06-05 NOTE — Progress Notes (Signed)
PROGRESS NOTE  Norma [name] ZOX:[id number] DOB: 11/29/1975 DOA: 06/04/2016 PCP: Evelene CroonNIEMEYER, MEINDERT, MD  Brief Narrative: 40 year old woman seen in ED 11/19 for fever, headache, nausea, vomiting. Treated for hypokalemia and UTI and discharged. Blood cultures obtained at that time eventually 1/2 positive for bacteria and patient was referred back to the emergency department for further evaluation. Reported diarrhea, nausea, epigastric discomfort, poor oral intake.  Assessment/Plan: 1. Fever, resolved nearly 24 hours. Completely asymptomatic. Blood culture 1/2 micrococcus consistent with contaminant. Discussed with infectious disease. No further evaluation suggested. Repeat blood cultures pending. 2. Epigastric abdominal pain. Completely resolved. Given recent nausea, vomiting, and diarrhea suspect gastritis. LFTs within normal limits. Lipase not elevated. Right upper quadrant ultrasound, no gallstones seen. Small gallbladder sludge. Normal common bile duct. 3. Nausea, vomiting, diarrhea, resolved. 4. Hypokalemia. Secondary to GI losses, diarrhea, poor oral intake. Resolved. 5. Hypomagnesemia. Replete it. 6. Quantitative hCG positive . Patient has had a hysterectomy, the significance of this is unclear. I discussed this with her and recommended repeat testing in the outpatient setting and further workup if remains elevated. 7. Normocytic anemia of unclear etiology. Likely trending down secondary to hydration. Completely asymptomatic. Follow-up as an outpatient. 8. AKI. Secondary to poor oral intake and dehydration. Improving. 9. Possible UTI on admission. Urine culture previously multiple organisms none. Was taking antibiotics at home. predominant. 10. Husband died approximately 10 days ago   Completely asymptomatic. Repeat blood cultures no growth today. Initial findings consistent with contaminant. No artificial devices or catheters in place. Discussed with infectious disease. Discharge  home. I suspect fever related to a gastroenteritis. Patient is to return for fever, recurrence of symptoms or worsening of condition.    follow-up elevated beta-hCG as an outpatient.  Brendia Sacksaniel Goodrich, MD  [facility] Direct contact: [phone] --Via amion app OR  --[email or web address]; password [email or web address]  7PM-7AM contact night coverage as above 06/05/2016, 8:46 AM  LOS: 0 days   Consultants:    Procedures:    Antimicrobials:  Ceftriaxone 11/21 >> 11/22  Vancomycin 11/21 >> 11/22  CC: f/u fever  Interval history/Subjective: No further fever. Feels well. No complaints. No shortness of breath. No nausea, vomiting or diarrhea. No pain. Wants to go home.  Objective: Vitals:   06/04/16 1958 06/05/16 0320 06/05/16 0500 06/05/16 0727  BP: (!) 98/55  105/60   Pulse: 93  85   Resp: 20  20   Temp: 99.4 F (37.4 C) 99.7 F (37.6 C) 97.5 F (36.4 C)   TempSrc: Oral Oral Oral   SpO2: 98%  97% 96%  Weight:      Height:        Intake/Output Summary (Last 24 hours) at 06/05/16 0846 Last data filed at 06/05/16 0641  Gross per 24 hour  Intake          1931.66 ml  Output              300 ml  Net          1631.66 ml     Filed Weights   06/04/16 0823 06/04/16 1138  Weight: 83.9 kg (185 lb) 90.9 kg (200 lb 6.4 oz)    Exam:    Constitutional:  . Appears calm and comfortable Respiratory:  . CTA bilaterally, no w/r/r.  . Respiratory effort normal. No retractions or accessory muscle use Cardiovascular:  . RRR, no m/r/g . No LE extremity edema   . Telemetry SR Skin:  . No rashes, lesions, ulcers noted Psychiatric:  . judgement and insight  appear normal . Mental status o Mood, affect appropriate o Orientation to person, place, time   I have personally reviewed following labs and imaging studies:  Potassium now normal. Magnesium 1.5. BUN, creatinine improving. LFTs unremarkable.  Hemoglobin 9.5  Abdominal ultrasound unremarkable.  Scheduled Meds: . budesonide  (PULMICORT) nebulizer solution  0.25 mg Nebulization BID  . cefTRIAXone (ROCEPHIN)  IV  1 g Intravenous Q24H  . enoxaparin (LOVENOX) injection  40 mg Subcutaneous Q24H  . pantoprazole  40 mg Oral Daily  . vancomycin  750 mg Intravenous Q12H   Continuous Infusions: . sodium chloride 0.9 % 1,000 mL with potassium chloride 40 mEq infusion 100 mL/hr at 06/05/16 [id number]    Active Problems:   Bacteremia   UTI (urinary tract infection)   Abdominal pain, epigastric   Nausea and vomiting   Hypokalemia   AKI (acute kidney injury) (HCC)   Positive blood culture   Diarrhea   LOS: 0 days

## 2016-06-05 NOTE — Progress Notes (Signed)
Late entry for this morning. Patient off telemetry per call from [facility] Tele[facility] monitoring. On assessment, telemetry monitor on bedside table, pt stated she removed it because she wanted to go home. Discussed with patient that heart monitor should remain in place until discharge unless d/c'd per MD. Pt verbalized understanding. Earnstine RegalAshley Arlington Sigmund, RN

## 2016-06-05 NOTE — Progress Notes (Signed)
IV removed, WNL. D/C instructions given. Verbalized understanding. Pt d/c to home

## 2016-06-06 LAB — URINE CULTURE: CULTURE: NO GROWTH

## 2016-06-08 LAB — CULTURE, BLOOD (ROUTINE X 2): CULTURE: NO GROWTH

## 2016-06-09 LAB — CULTURE, BLOOD (ROUTINE X 2)
Culture: NO GROWTH
Culture: NO GROWTH

## 2017-05-18 IMAGING — CT CT ABD-PELV W/ CM
2 of 5 series · 15 of 46 positions shown, 17 images · IV contrast (iopamidol)
Comparison: 09/15/2015, 07/04/2014

CLINICAL DATA: Constant low abdominal pain with nausea and
vomiting, hysterectomy 2 days ago

EXAM:
CT ABDOMEN AND PELVIS WITH CONTRAST
TECHNIQUE: Multidetector CT imaging of the abdomen and pelvis was performed
using the standard protocol following bolus administration of
intravenous contrast.
CONTRAST:  100mL 6QFL6F-CUU IOPAMIDOL (6QFL6F-CUU) INJECTION 61%

[Series 2: abd_pel_with 5.0 b40f · axial · 0.90mm/px · z∈[-406,+39]mm · 12 of 103 slices shown, 14 images]
[im 7/103  soft-tissue]
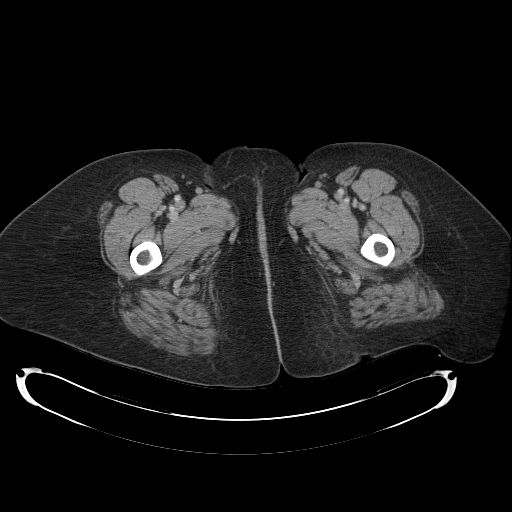
[im 7/103  bone]
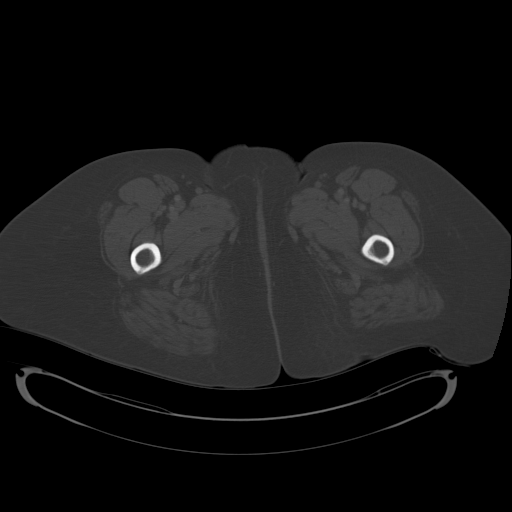
[im 14/103  soft-tissue]
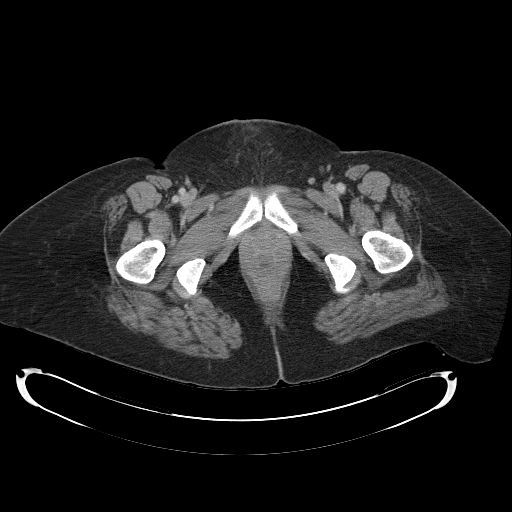
[im 21/103  soft-tissue]
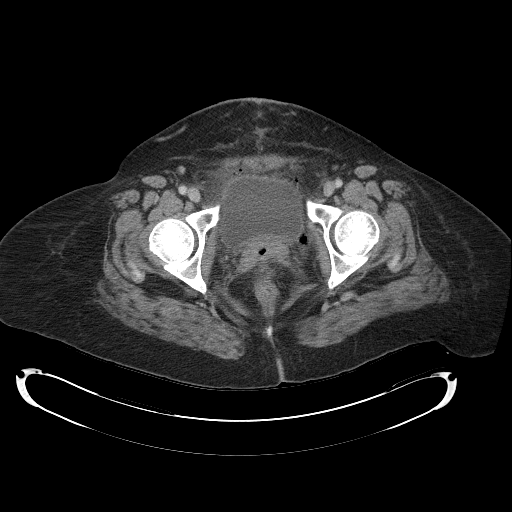
[im 35/103  soft-tissue]
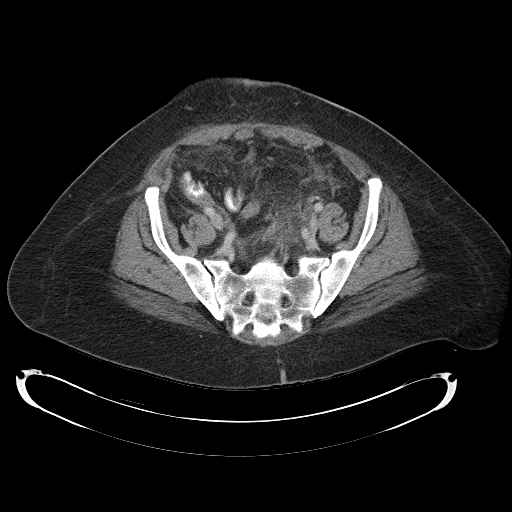
[im 41/103  soft-tissue]
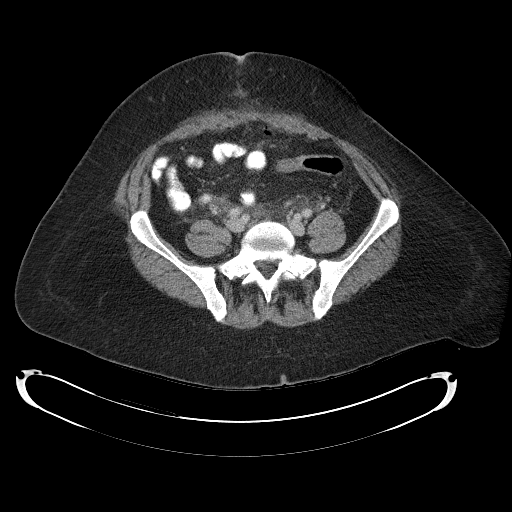
[im 48/103  soft-tissue]
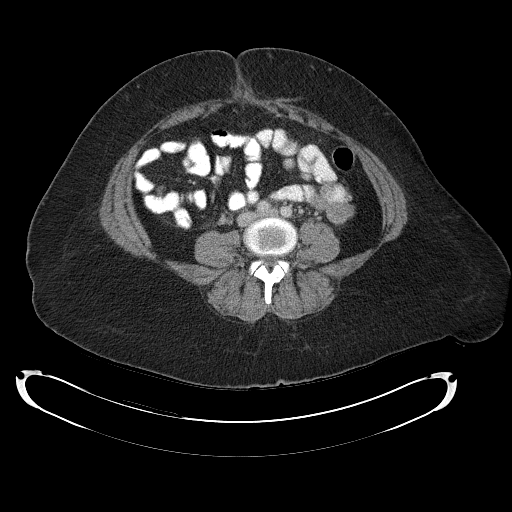
[im 55/103  soft-tissue]
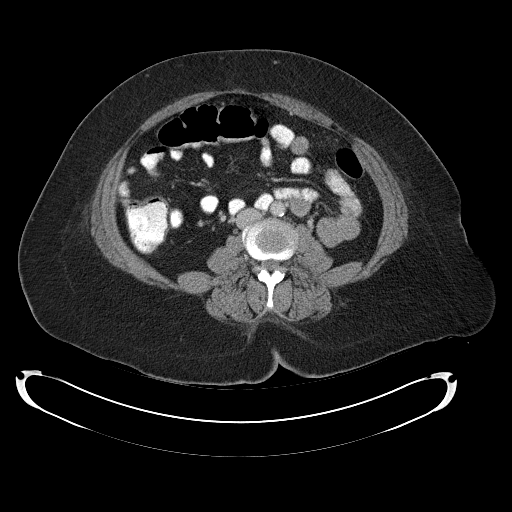
[im 62/103  soft-tissue]
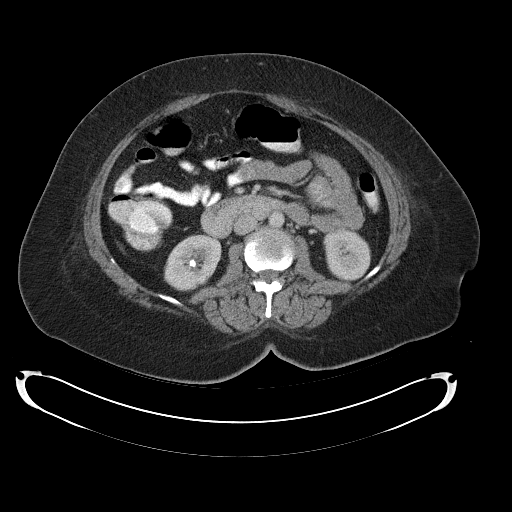
[im 69/103  soft-tissue]
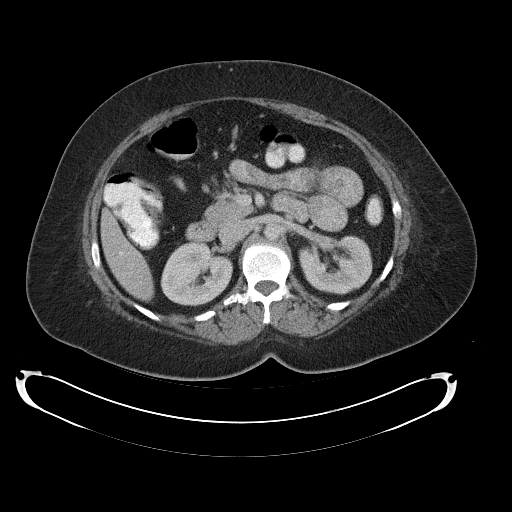
[im 69/103  bone]
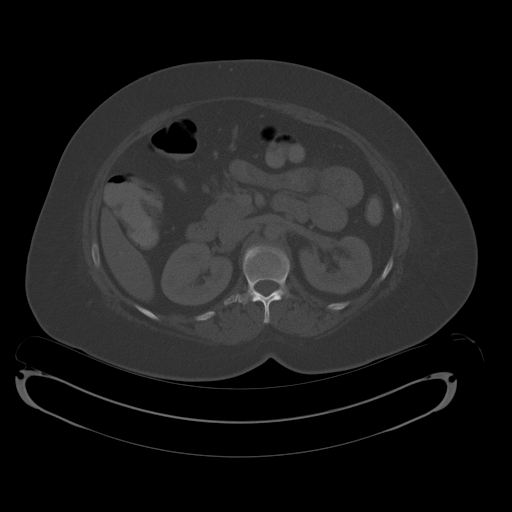
[im 82/103  soft-tissue]
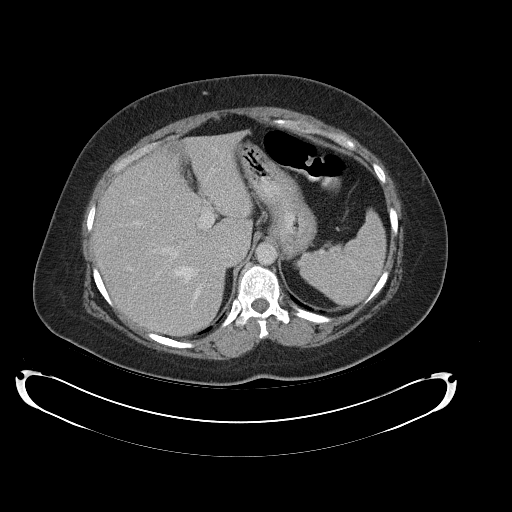
[im 89/103  soft-tissue]
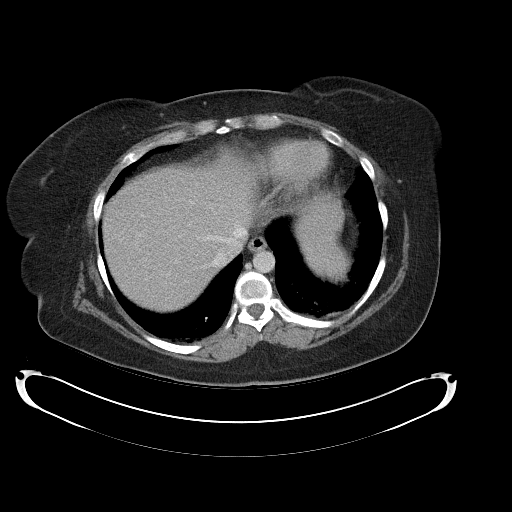
[im 96/103  soft-tissue]
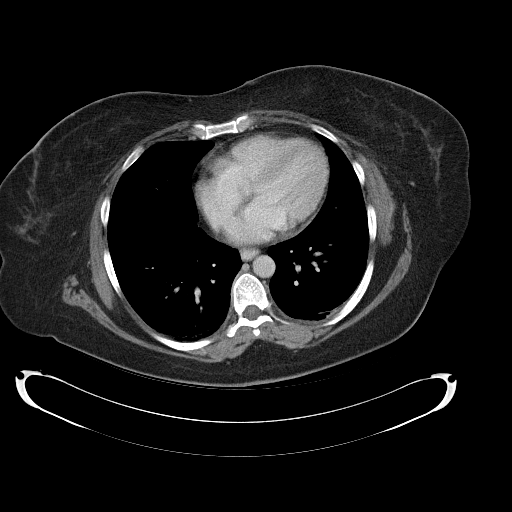

[Series 3: abd_pel_with 3.0 spo cor · coronal · 0.67mm/px · 3 of 71 slices shown]
[im 24/71  soft-tissue]
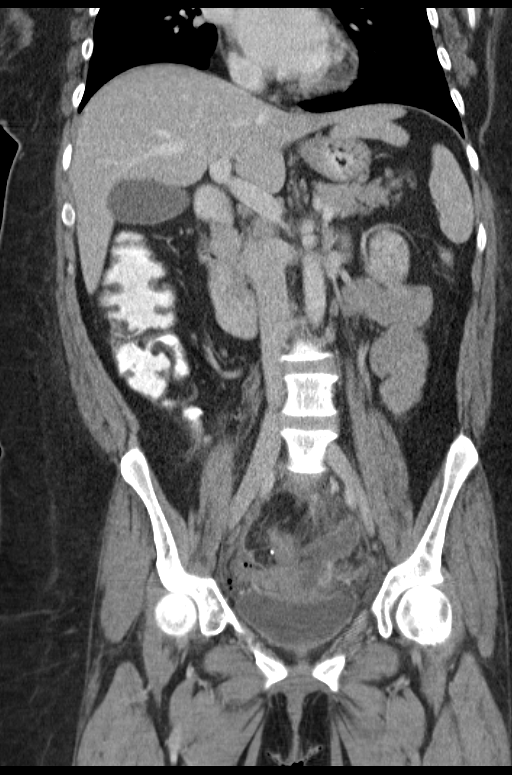
[im 32/71  soft-tissue]
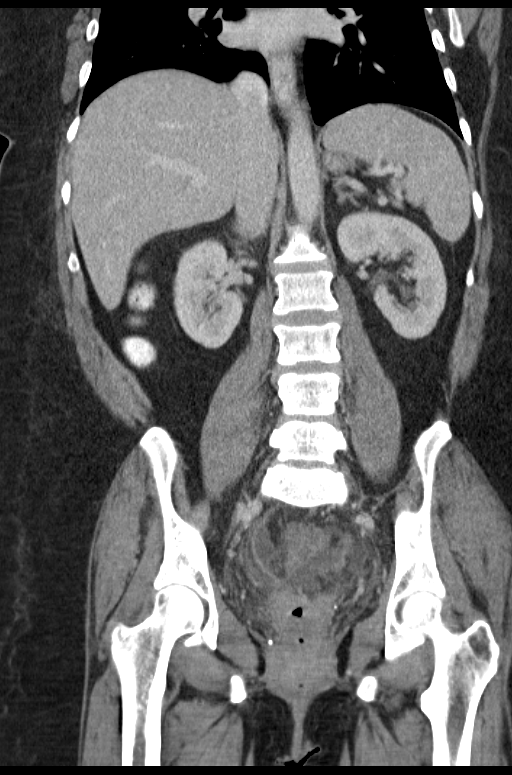
[im 39/71  soft-tissue]
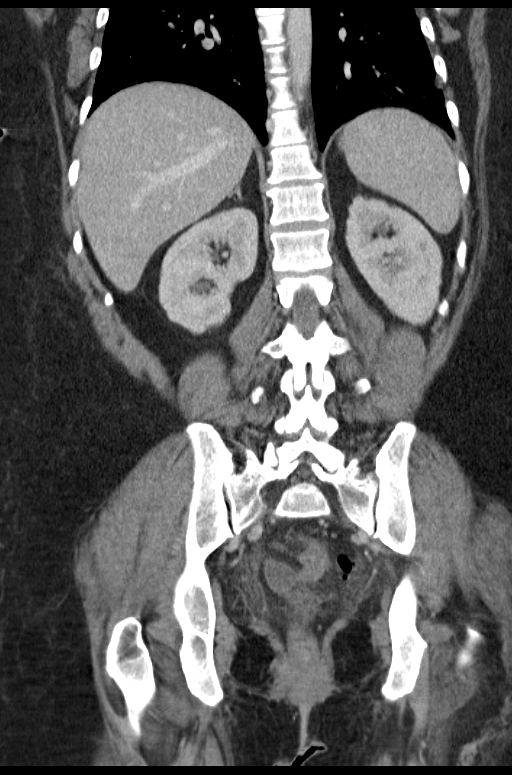

[15 of 46 positions shown; findings below may reference images not displayed]

FINDINGS: Lower chest: No acute findings. 2 mm nodule peripherally in the left
lower lobe image 5. This stable

Hepatobiliary: Trace perihepatic fluid decreased from 07/04/2014 and
stable from 09/15/2015. No focal hepatic parenchymal abnormalities.
Gallbladder normal.

Pancreas: Normal

Spleen: Normal

Adrenals/Urinary Tract: Normal adrenal glands. Left kidney is
normal. 4 mm lower pole nonobstructing stone right kidney. Bladder
normal.

Stomach/Bowel: Nonobstructive bowel gas pattern. Stomach small bowel
and large bowel within normal limits.

Vascular/Lymphatic: Mild aortic calcification. No significant
adenopathy.

Reproductive: The uterus is now surgically absent. There is a small
volume of pneumoperitoneum in the pelvis and to a lesser degree in
the abdomen, consistent with recent surgery. There is small
heterogenous fluid collection in the surgical bed measuring about
5.5 x 3.0 cm

Musculoskeletal: No acute findings
IMPRESSION: Anticipated findings status post hysterectomy, including a small
volume of pneumoperitoneum. Small postoperative fluid collection
also not necessarily unexpected but imaging follow up suggested if
symptoms persist.

## 2017-08-12 ENCOUNTER — Other Ambulatory Visit: Payer: Self-pay

## 2017-08-29 ENCOUNTER — Encounter: Payer: Self-pay | Admitting: Urology

## 2017-08-29 ENCOUNTER — Ambulatory Visit (INDEPENDENT_AMBULATORY_CARE_PROVIDER_SITE_OTHER): Payer: Self-pay | Admitting: Urology

## 2017-08-29 VITALS — BP 137/88 | HR 130 | Ht 62.0 in | Wt 224.4 lb

## 2017-08-29 DIAGNOSIS — N200 Calculus of kidney: Secondary | ICD-10-CM

## 2017-08-29 DIAGNOSIS — R109 Unspecified abdominal pain: Secondary | ICD-10-CM

## 2017-08-29 LAB — URINALYSIS, COMPLETE
Bilirubin, UA: NEGATIVE
Glucose, UA: NEGATIVE
Ketones, UA: NEGATIVE
NITRITE UA: NEGATIVE
PH UA: 5.5 (ref 5.0–7.5)
Specific Gravity, UA: 1.02 (ref 1.005–1.030)
UUROB: 0.2 mg/dL (ref 0.2–1.0)

## 2017-08-29 LAB — MICROSCOPIC EXAMINATION

## 2017-08-29 NOTE — Progress Notes (Signed)
08/29/2017 2:43 PM   Norma [name] [birth year] [id number]  Referring provider: Evelene Croon, MD [address] [place], [place] [address]  Chief Complaint  Patient presents with  . Nephrolithiasis    HPI: The patient is a 42 year old female presents today for evaluation of a possible right 8 mm nononbstructing renal stone.  This was seen on a abdominal ultrasound performed for epigastric and right upper quadrant pain.  The images are not available for me to review.  The patiently currently endorses no right flank pain.  She does not note right lower back pain above her pelvis intermittently.  It comes and goes.  Is been there for some time.  But no colicky pain.  She has no gross hematuria.  She has no dysuria.  She has no issues with voiding.  No previous issues with stones.   PMH: Past Medical History:  Diagnosis Date  . AKI (acute kidney injury) (HCC) 06/04/2016  . Anxiety   . COPD (chronic obstructive pulmonary disease) (HCC)   . Depression   . History of kidney stones   . Hypertension   . IBS (irritable bowel syndrome)     Surgical History: Past Surgical History:  Procedure Laterality Date  . ABDOMINAL HYSTERECTOMY N/A 11/09/2015   Procedure: HYSTERECTOMY ABDOMINAL;  Surgeon: Tilda Burrow, MD;  Location: [facility];  Service: Gynecology;  Laterality: N/A;  . DILATION AND CURETTAGE OF UTERUS    . SALPINGOOPHORECTOMY Bilateral 11/09/2015   Procedure: SALPINGO OOPHORECTOMY;  Surgeon: Tilda Burrow, MD;  Location: [facility];  Service: Gynecology;  Laterality: Bilateral;    Home Medications:  Allergies as of 08/29/2017   No Known Allergies     Medication List        Accurate as of 08/29/17  2:43 PM. Always use your most recent med list.          albuterol (2.5 MG/3ML) 0.083% nebulizer solution Commonly known as:  PROVENTIL Take 2.5 mg by nebulization every 6 (six) hours as needed for wheezing.   albuterol 108 (90 Base) MCG/ACT inhaler Commonly known as:   PROVENTIL HFA;VENTOLIN HFA Inhale 2 puffs into the lungs every 6 (six) hours as needed for wheezing.   buPROPion 150 MG 24 hr tablet Commonly known as:  WELLBUTRIN XL Take 150 mg by mouth daily.   cyclobenzaprine 10 MG tablet Commonly known as:  FLEXERIL Take 10 mg by mouth 3 (three) times daily as needed for muscle spasms.   estradiol 1 MG tablet Commonly known as:  ESTRACE TAKE 1 TABLET (1 MG TOTAL) BY MOUTH DAILY.   GOODY HEADACHE PO Take 1 Package by mouth daily as needed (pain).   meloxicam 7.5 MG tablet Commonly known as:  MOBIC Take 7.5 mg by mouth daily.   pantoprazole 40 MG tablet Commonly known as:  PROTONIX Take 40 mg by mouth daily.   QVAR 80 MCG/ACT inhaler Generic drug:  beclomethasone Inhale 2 puffs into the lungs daily.   Vitamin D3 5000 units Caps Take by mouth.       Allergies: No Known Allergies  Family History: Family History  Problem Relation Age of Onset  . COPD Mother   . Diabetes Father   . Hypertension Father   . Heart disease Sister   . Heart disease Maternal Aunt   . Birth defects Daughter   . Kidney disease Daughter   . Bladder Cancer Neg Hx   . Kidney cancer Neg Hx     Social History:  reports that she  has been smoking cigarettes.  She has a 28.50 pack-year smoking history. she has never used smokeless tobacco. She reports that she does not drink alcohol or use drugs.  ROS: UROLOGY Frequent Urination?: No Hard to postpone urination?: No Burning/pain with urination?: No Get up at night to urinate?: Yes Leakage of urine?: No Urine stream starts and stops?: Yes Trouble starting stream?: No Do you have to strain to urinate?: No Blood in urine?: No Urinary tract infection?: No Sexually transmitted disease?: No Injury to kidneys or bladder?: No Painful intercourse?: No Weak stream?: Yes Currently pregnant?: No Vaginal bleeding?: No Last menstrual period?: n  Gastrointestinal Nausea?: Yes Vomiting?:  No Indigestion/heartburn?: Yes Diarrhea?: No Constipation?: Yes  Constitutional Fever: No Night sweats?: Yes Weight loss?: No Fatigue?: No  Skin Skin rash/lesions?: No Itching?: Yes  Eyes Blurred vision?: Yes Double vision?: Yes  Ears/Nose/Throat Sore throat?: Yes Sinus problems?: Yes  Hematologic/Lymphatic Swollen glands?: No Easy bruising?: Yes  Cardiovascular Leg swelling?: Yes Chest pain?: Yes  Respiratory Cough?: Yes Shortness of breath?: Yes  Endocrine Excessive thirst?: Yes  Musculoskeletal Back pain?: Yes Joint pain?: Yes  Neurological Headaches?: Yes Dizziness?: Yes  Psychologic Depression?: Yes Anxiety?: Yes  Physical Exam: BP 137/88 (BP Location: Right Arm, Patient Position: Sitting, Cuff Size: Large)   Pulse (!) 130   Ht 5\' 2"  (1.575 m)   Wt 224 lb 6.4 oz (101.8 kg)   LMP 09/24/2015   BMI 41.04 kg/m   Constitutional:  Alert and oriented, No acute distress. HEENT: [place] AT, moist mucus membranes.  Trachea midline, no masses. Cardiovascular: No clubbing, cyanosis, or edema. Respiratory: Normal respiratory effort, no increased work of breathing. GI: Abdomen is soft, nontender, nondistended, no abdominal masses GU: No CVA tenderness.  Skin: No rashes, bruises or suspicious lesions. Lymph: No cervical or inguinal adenopathy. Neurologic: Grossly intact, no focal deficits, moving all 4 extremities. Psychiatric: Normal mood and affect.  Laboratory Data: Lab Results  Component Value Date   WBC 5.6 06/05/2016   HGB 9.5 (L) 06/05/2016   HCT 28.4 (L) 06/05/2016   MCV 86.1 06/05/2016   PLT 161 06/05/2016    Lab Results  Component Value Date   CREATININE 1.32 (H) 06/05/2016    No results found for: PSA  No results found for: TESTOSTERONE  No results found for: HGBA1C  Urinalysis    Component Value Date/Time   COLORURINE YELLOW 06/04/2016 1544   APPEARANCEUR HAZY (A) 06/04/2016 1544   LABSPEC 1.015 06/04/2016 1544   PHURINE 6.0  06/04/2016 1544   GLUCOSEU NEGATIVE 06/04/2016 1544   HGBUR MODERATE (A) 06/04/2016 1544   BILIRUBINUR NEGATIVE 06/04/2016 1544   KETONESUR NEGATIVE 06/04/2016 1544   PROTEINUR 100 (A) 06/04/2016 1544   UROBILINOGEN 0.2 07/04/2014 1210   NITRITE NEGATIVE 06/04/2016 1544   LEUKOCYTESUR TRACE (A) 06/04/2016 1544    Pertinent Imaging: RUS as above. Image report as above  Assessment & Plan:    1.  Possible right renal stone I discussed with the patient that renal ultrasounds tend to over diagnose renal stone burden. Therefore, it is not clear if she actually has a stone at this time.  I have offered her a CT scan to truly determine the true presence of a right renal stone.  She has agreed to proceed with this.  She will follow-up for results.   Return for after CT.  Hildred LaserBrian James Cyndy Braver, MD  [facility] [address], [address] [place], [place] [address] [phone]

## 2017-09-10 ENCOUNTER — Ambulatory Visit (HOSPITAL_COMMUNITY): Payer: Medicaid Other

## 2017-09-11 ENCOUNTER — Ambulatory Visit (HOSPITAL_COMMUNITY): Payer: Medicaid Other

## 2017-09-12 ENCOUNTER — Ambulatory Visit: Payer: Self-pay

## 2017-09-17 ENCOUNTER — Ambulatory Visit (HOSPITAL_COMMUNITY)
Admission: RE | Admit: 2017-09-17 | Discharge: 2017-09-17 | Disposition: A | Discharge status: Home / Self Care | Payer: Medicaid Other | Source: Ambulatory Visit | Attending: Urology | Admitting: Urology

## 2017-09-17 DIAGNOSIS — R109 Unspecified abdominal pain: Secondary | ICD-10-CM

## 2017-09-17 DIAGNOSIS — N200 Calculus of kidney: Secondary | ICD-10-CM | POA: Diagnosis not present

## 2017-09-18 ENCOUNTER — Ambulatory Visit (INDEPENDENT_AMBULATORY_CARE_PROVIDER_SITE_OTHER): Payer: Medicaid Other | Admitting: Urology

## 2017-09-18 ENCOUNTER — Encounter: Payer: Self-pay | Admitting: Urology

## 2017-09-18 VITALS — BP 152/86 | HR 81 | Ht 62.0 in | Wt 226.5 lb

## 2017-09-18 DIAGNOSIS — N200 Calculus of kidney: Secondary | ICD-10-CM

## 2017-09-18 NOTE — Progress Notes (Signed)
09/18/2017 11:46 AM   Norma ClassJennifer L [name] 08/24/1975 [id number]  Referring provider: Evelene CroonNiemeyer, Meindert, MD [address] [place], [place] [id number]  Chief Complaint  Patient presents with  . Nephrolithiasis    HPI: The patient is a 42 year old female presents today for evaluation of a possible right 8 mm nononbstructing renal stone.  This was seen on a abdominal ultrasound performed for epigastric and right upper quadrant pain.  The images are not available for me to review.  The patiently currently endorses no right flank pain.  She does not note right lower back pain above her pelvis intermittently.  It comes and goes.  Is been there for some time.  But no colicky pain.  She has no gross hematuria.  She has no dysuria.  She has no issues with voiding.  No previous issues with stones.  Patient underwent a CT scan due to the renal ultrasound a 9 mm due to the renal stone to be a screening test.  I did show right lower pole stone and a 2-3 mm left midpole stone.  There is no obstructing ureteral nephrolithiasis.  PMH: Past Medical History:  Diagnosis Date  . AKI (acute kidney injury) (HCC) 06/04/2016  . Anxiety   . COPD (chronic obstructive pulmonary disease) (HCC)   . Depression   . History of kidney stones   . Hypertension   . IBS (irritable bowel syndrome)     Surgical History: Past Surgical History:  Procedure Laterality Date  . ABDOMINAL HYSTERECTOMY N/A 11/09/2015   Procedure: HYSTERECTOMY ABDOMINAL;  Surgeon: Tilda BurrowJohn Ferguson V, MD;  Location: [facility];  Service: Gynecology;  Laterality: N/A;  . DILATION AND CURETTAGE OF UTERUS    . SALPINGOOPHORECTOMY Bilateral 11/09/2015   Procedure: SALPINGO OOPHORECTOMY;  Surgeon: Tilda BurrowJohn Ferguson V, MD;  Location: [facility];  Service: Gynecology;  Laterality: Bilateral;    Home Medications:  Allergies as of 09/18/2017   No Known Allergies     Medication List        Accurate as of 09/18/17 11:46 AM. Always use your most recent med list.            albuterol (2.5 MG/3ML) 0.083% nebulizer solution Commonly known as:  PROVENTIL Take 2.5 mg by nebulization every 6 (six) hours as needed for wheezing.   albuterol 108 (90 Base) MCG/ACT inhaler Commonly known as:  PROVENTIL HFA;VENTOLIN HFA Inhale 2 puffs into the lungs every 6 (six) hours as needed for wheezing.   buPROPion 150 MG 24 hr tablet Commonly known as:  WELLBUTRIN XL Take 150 mg by mouth daily.   cyclobenzaprine 10 MG tablet Commonly known as:  FLEXERIL Take 10 mg by mouth 3 (three) times daily as needed for muscle spasms.   estradiol 1 MG tablet Commonly known as:  ESTRACE TAKE 1 TABLET (1 MG TOTAL) BY MOUTH DAILY.   GOODY HEADACHE PO Take 1 Package by mouth daily as needed (pain).   meloxicam 7.5 MG tablet Commonly known as:  MOBIC Take 7.5 mg by mouth daily.   pantoprazole 40 MG tablet Commonly known as:  PROTONIX Take 40 mg by mouth daily.   QVAR 80 MCG/ACT inhaler Generic drug:  beclomethasone Inhale 2 puffs into the lungs daily.   Vitamin D3 5000 units Caps Take by mouth.       Allergies: No Known Allergies  Family History: Family History  Problem Relation Age of Onset  . COPD Mother   . Diabetes Father   . Hypertension Father   . Heart  disease Sister   . Heart disease Maternal Aunt   . Birth defects Daughter   . Kidney disease Daughter   . Bladder Cancer Neg Hx   . Kidney cancer Neg Hx     Social History:  reports that she has been smoking cigarettes.  She has a 28.50 pack-year smoking history. she has never used smokeless tobacco. She reports that she does not drink alcohol or use drugs.  ROS: UROLOGY Frequent Urination?: Yes Hard to postpone urination?: No Burning/pain with urination?: No Get up at night to urinate?: Yes Leakage of urine?: Yes Urine stream starts and stops?: Yes Trouble starting stream?: No Do you have to strain to urinate?: No Blood in urine?: No Urinary tract infection?: No Sexually transmitted  disease?: No Injury to kidneys or bladder?: No Painful intercourse?: No Weak stream?: No Currently pregnant?: No Vaginal bleeding?: No Last menstrual period?: n  Gastrointestinal Nausea?: Yes Vomiting?: No Indigestion/heartburn?: No Diarrhea?: No Constipation?: Yes  Constitutional Fever: No Night sweats?: Yes Weight loss?: No Fatigue?: No  Skin Skin rash/lesions?: No Itching?: No  Eyes Blurred vision?: Yes Double vision?: No  Ears/Nose/Throat Sore throat?: No Sinus problems?: No  Hematologic/Lymphatic Swollen glands?: No Easy bruising?: Yes  Cardiovascular Leg swelling?: No Chest pain?: No  Respiratory Cough?: No Shortness of breath?: Yes  Endocrine Excessive thirst?: Yes  Musculoskeletal Back pain?: Yes Joint pain?: Yes  Neurological Headaches?: Yes Dizziness?: Yes  Psychologic Depression?: Yes Anxiety?: Yes  Physical Exam: BP (!) 152/86 (BP Location: Right Arm, Patient Position: Sitting, Cuff Size: Large)   Pulse 81   Ht 5\' 2"  (1.575 m)   Wt 226 lb 8 oz (102.7 kg)   LMP 09/24/2015   BMI 41.43 kg/m   Constitutional:  Alert and oriented, No acute distress. HEENT: [place] AT, moist mucus membranes.  Trachea midline, no masses. Cardiovascular: No clubbing, cyanosis, or edema. Respiratory: Normal respiratory effort, no increased work of breathing. GI: Abdomen is soft, nontender, nondistended, no abdominal masses GU: No CVA tenderness.  Skin: No rashes, bruises or suspicious lesions. Lymph: No cervical or inguinal adenopathy. Neurologic: Grossly intact, no focal deficits, moving all 4 extremities. Psychiatric: Normal mood and affect.  Laboratory Data: Lab Results  Component Value Date   WBC 5.6 06/05/2016   HGB 9.5 (L) 06/05/2016   HCT 28.4 (L) 06/05/2016   MCV 86.1 06/05/2016   PLT 161 06/05/2016    Lab Results  Component Value Date   CREATININE 1.32 (H) 06/05/2016    No results found for: PSA  No results found for:  TESTOSTERONE  No results found for: HGBA1C  Urinalysis    Component Value Date/Time   COLORURINE YELLOW 06/04/2016 1544   APPEARANCEUR Cloudy (A) 08/29/2017 1422   LABSPEC 1.015 06/04/2016 1544   PHURINE 6.0 06/04/2016 1544   GLUCOSEU Negative 08/29/2017 1422   HGBUR MODERATE (A) 06/04/2016 1544   BILIRUBINUR Negative 08/29/2017 1422   KETONESUR NEGATIVE 06/04/2016 1544   PROTEINUR 1+ (A) 08/29/2017 1422   PROTEINUR 100 (A) 06/04/2016 1544   UROBILINOGEN 0.2 07/04/2014 1210   NITRITE Negative 08/29/2017 1422   NITRITE NEGATIVE 06/04/2016 1544   LEUKOCYTESUR 2+ (A) 08/29/2017 1422    Pertinent Imaging: CT reviewed as above  Assessment & Plan:    1.  9 mm right lower pole stone Discussed treatment options with the patient including active surveillance, lithotripsy, and ureteroscopy.  We discussed the risks and benefits of each treatment option.  At this time, she is not interested in surgery.  She  will follow an active surveillance protocol.  She will follow-up in 6 months with a KUB.  If this shows stone stability, we can have her follow-up annually.  Return in about 6 months (around 03/21/2018) for KUB prior.  Hildred Laser, MD  [facility] [address], [address] [place], [place] [address] [phone]

## 2018-03-20 ENCOUNTER — Ambulatory Visit: Payer: Medicaid Other | Admitting: Urology

## 2018-03-20 ENCOUNTER — Encounter: Payer: Self-pay | Admitting: Urology

## 2018-05-06 ENCOUNTER — Encounter (HOSPITAL_COMMUNITY): Payer: Self-pay | Admitting: Emergency Medicine

## 2018-05-06 ENCOUNTER — Emergency Department (HOSPITAL_COMMUNITY)
Admission: EM | Admit: 2018-05-06 | Discharge: 2018-05-06 | Disposition: A | Discharge status: Home / Self Care | Payer: Medicaid Other | Attending: Emergency Medicine | Admitting: Emergency Medicine

## 2018-05-06 ENCOUNTER — Other Ambulatory Visit: Payer: Self-pay

## 2018-05-06 ENCOUNTER — Emergency Department (HOSPITAL_COMMUNITY): Payer: Medicaid Other

## 2018-05-06 DIAGNOSIS — I10 Essential (primary) hypertension: Secondary | ICD-10-CM | POA: Diagnosis not present

## 2018-05-06 DIAGNOSIS — Z79899 Other long term (current) drug therapy: Secondary | ICD-10-CM | POA: Insufficient documentation

## 2018-05-06 DIAGNOSIS — J449 Chronic obstructive pulmonary disease, unspecified: Secondary | ICD-10-CM | POA: Diagnosis not present

## 2018-05-06 DIAGNOSIS — F17210 Nicotine dependence, cigarettes, uncomplicated: Secondary | ICD-10-CM | POA: Diagnosis not present

## 2018-05-06 DIAGNOSIS — M79645 Pain in left finger(s): Secondary | ICD-10-CM | POA: Insufficient documentation

## 2018-05-06 DIAGNOSIS — M79642 Pain in left hand: Secondary | ICD-10-CM | POA: Diagnosis present

## 2018-05-06 MED ORDER — NAPROXEN 500 MG PO TABS: 500.0000 mg | ORAL_TABLET | Freq: Two times a day (BID) | ORAL | 0 refills | Status: DC

## 2018-05-06 NOTE — Discharge Instructions (Addendum)
You may apply ice packs on and off to your finger and wear the splint as needed.  Call Dr. Mort Sawyers office to arrange a follow-up appointment in 1 week if not improving.  Do not take Mobic, Aleve, or Advil or ibuprofen while taking your prescription for naproxen

## 2018-05-06 NOTE — ED Triage Notes (Signed)
Pt c/o left posterior hand pain and noticed knot between index and middle finger. denies injury. States pain is 8 but has not taken anything for the pain.

## 2018-05-06 NOTE — ED Provider Notes (Signed)
[facility] EMERGENCY DEPARTMENT Provider Note   CSN: [id number] Arrival date & time: 05/06/18  4098     History   Chief Complaint Chief Complaint  Patient presents with  . Hand Pain    HPI Norma [name] is a 42 y.o. female.  HPI   Norma [name] is a 42 y.o. female who presents to the Emergency Department complaining of pain and swelling of her left hand.  Symptoms began this morning.  She noticed swelling and pain associated with movement of her left index and middle fingers.  She is unsure if she may have struck her hand on the nightstand.  She denies redness, numbness, pain to the distal finger, and wrist pain.  Pt is right hand dominant.     Past Medical History:  Diagnosis Date  . AKI (acute kidney injury) (HCC) 06/04/2016  . Anxiety   . COPD (chronic obstructive pulmonary disease) (HCC)   . Depression   . History of kidney stones   . Hypertension   . IBS (irritable bowel syndrome)     Patient Active Problem List   Diagnosis Date Noted  . Bacteremia 06/04/2016  . UTI (urinary tract infection) 06/04/2016  . Abdominal pain, epigastric 06/04/2016  . Nausea and vomiting 06/04/2016  . Hypokalemia 06/04/2016  . AKI (acute kidney injury) (HCC) 06/04/2016  . Positive blood culture 06/04/2016  . Diarrhea 06/04/2016  . Endometriosis of pelvis 11/16/2015  . 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 Rectal fissure 10/04/2015    Past Surgical History:  Procedure Laterality Date  . ABDOMINAL HYSTERECTOMY N/A 11/09/2015   Procedure: HYSTERECTOMY ABDOMINAL;  Surgeon: Tilda Burrow, MD;  Location: [facility];  Service: Gynecology;  Laterality: N/A;  . DILATION AND CURETTAGE OF UTERUS    . SALPINGOOPHORECTOMY Bilateral 11/09/2015   Procedure: SALPINGO OOPHORECTOMY;  Surgeon: Tilda Burrow, MD;  Location: [facility];  Service: Gynecology;  Laterality: Bilateral;     OB History    Gravida  2   Para  2   Term  2     Preterm      AB      Living  2     SAB      TAB      Ectopic      Multiple      Live Births               Home Medications    Prior to Admission medications   Medication Sig Start Date End Date Taking? Authorizing Provider  albuterol (PROVENTIL HFA;VENTOLIN HFA) 108 (90 BASE) MCG/ACT inhaler Inhale 2 puffs into the lungs every 6 (six) hours as needed for wheezing.    [provider]  albuterol (PROVENTIL) (2.5 MG/3ML) 0.083% nebulizer solution Take 2.5 mg by nebulization every 6 (six) hours as needed for wheezing.    [provider]  Aspirin-Acetaminophen-Caffeine (GOODY HEADACHE PO) Take 1 Package by mouth daily as needed (pain).    [provider]  buPROPion (WELLBUTRIN XL) 150 MG 24 hr tablet Take 150 mg by mouth daily.    [provider]  Cholecalciferol (VITAMIN D3) 5000 units CAPS Take by mouth.    [provider]  cyclobenzaprine (FLEXERIL) 10 MG tablet Take 10 mg by mouth 3 (three) times daily as needed for muscle spasms.    [provider]  estradiol (ESTRACE) 1 MG tablet TAKE 1 TABLET (1 MG TOTAL) BY MOUTH DAILY. 02/12/16   Tilda Burrow, MD  meloxicam (MOBIC) 7.5 MG tablet Take 7.5 mg by mouth daily.    [provider]  pantoprazole (PROTONIX) 40 MG tablet Take 40 mg by mouth daily.    [provider]  QVAR 80 MCG/ACT inhaler Inhale 2 puffs into the lungs daily. 09/19/15   [provider]    Family History Family History  Problem Relation Age of Onset  . COPD Mother   . Diabetes Father   . Hypertension Father   . Heart disease Sister   . Heart disease Maternal Aunt   . Birth defects Daughter   . Kidney disease Daughter   . Bladder Cancer Neg Hx   . Kidney cancer Neg Hx     Social History Social History   Tobacco Use  . Smoking status: Current Every Day Smoker    Packs/day: 1.50    Years: 19.00    Pack years: 28.50    Types: Cigarettes  . Smokeless tobacco: Never  Used  Substance Use Topics  . Alcohol use: No    Alcohol/week: 0.0 standard drinks  . Drug use: No     Allergies   Patient has no known allergies.   Review of Systems Review of Systems  Constitutional: Negative for chills and fever.  Musculoskeletal: Positive for arthralgias (left hand pain) and joint swelling.  Skin: Negative for color change, rash and wound.  Neurological: Negative for weakness and numbness.     Physical Exam Updated Vital Signs BP (!) 176/105 (BP Location: Right Arm) Comment: out of bp meds  Pulse 64   Temp 98.4 F (36.9 C) (Oral)   Resp 17   LMP 09/24/2015   SpO2 98%   Physical Exam  Constitutional: She appears well-developed and well-nourished. No distress.  HENT:  Head: Atraumatic.  Cardiovascular: Normal rate, regular rhythm and intact distal pulses.  Pulmonary/Chest: Effort normal and breath sounds normal. No respiratory distress.  Musculoskeletal: She exhibits tenderness. She exhibits no edema or deformity.  Focal ttp at the radial aspect of the MCP joint of the left middle finger.  No appreciable edema, no erythema ecchymosis, no tenderness along the tendon.  Pt has full ROM of the fingers of the left hand  Neurological: She is alert. No sensory deficit.  Skin: Skin is warm. Capillary refill takes less than 2 seconds. No rash noted. No erythema.  Nursing note and vitals reviewed.    ED Treatments / Results  Labs (all labs ordered are listed, but only abnormal results are displayed) Labs Reviewed - No data to display  EKG None  Radiology Dg Hand Complete Left  Result Date: 05/06/2018 CLINICAL DATA:  Left hand pain.  No known injury. EXAM: LEFT HAND - COMPLETE 3+ VIEW COMPARISON:  None. FINDINGS: There is no evidence of fracture or dislocation. There is no evidence of arthropathy or other focal bone abnormality. Soft tissues are unremarkable. IMPRESSION: Negative. Electronically Signed   By: Francene Boyers M.D.   On: 05/06/2018 10:50      Procedures Procedures (including critical care time)  Medications Ordered in ED Medications - No data to display   Initial Impression / Assessment and Plan / ED Course  I have reviewed the triage vital signs and the nursing notes.  Pertinent labs & imaging results that were available during my care of the patient were reviewed by me and considered in my medical decision making (see chart for details).     NV intact.  No skin changes.  XR reassuring.  Discussed possible early developing  ganglion cyst vs contusion.  Exam is mostly benign.  Will splint finger for comfort.  She agrees to ice, NSAID and orthopedic f/u in one week if needed.  No concerning sx's for infectious process.   Final Clinical Impressions(s) / ED Diagnoses   Final diagnoses:  Finger pain, left    ED Discharge Orders    None       Pauline Aus, PA-C 05/06/18 1116    Raeford Razor, MD 05/06/18 1558

## 2018-05-06 NOTE — ED Notes (Signed)
Patient transported to X-ray 

## 2019-05-12 ENCOUNTER — Emergency Department: Payer: Medicaid Other

## 2019-05-12 ENCOUNTER — Other Ambulatory Visit: Payer: Self-pay

## 2019-05-12 ENCOUNTER — Emergency Department
Admission: EM | Admit: 2019-05-12 | Discharge: 2019-05-12 | Disposition: A | Discharge status: Home / Self Care | Payer: Medicaid Other | Attending: Emergency Medicine | Admitting: Emergency Medicine

## 2019-05-12 DIAGNOSIS — R079 Chest pain, unspecified: Secondary | ICD-10-CM | POA: Diagnosis not present

## 2019-05-12 DIAGNOSIS — Z5321 Procedure and treatment not carried out due to patient leaving prior to being seen by health care provider: Secondary | ICD-10-CM | POA: Diagnosis not present

## 2019-05-12 DIAGNOSIS — R519 Headache, unspecified: Secondary | ICD-10-CM | POA: Insufficient documentation

## 2019-05-12 LAB — CBC
HCT: 41.5 % (ref 36.0–46.0)
Hemoglobin: 13.4 g/dL (ref 12.0–15.0)
MCH: 29.1 pg (ref 26.0–34.0)
MCHC: 32.3 g/dL (ref 30.0–36.0)
MCV: 90 fL (ref 80.0–100.0)
Platelets: 339 10*3/uL (ref 150–400)
RBC: 4.61 MIL/uL (ref 3.87–5.11)
RDW: 13.9 % (ref 11.5–15.5)
WBC: 11.4 10*3/uL — ABNORMAL HIGH (ref 4.0–10.5)
nRBC: 0 % (ref 0.0–0.2)

## 2019-05-12 LAB — BASIC METABOLIC PANEL
Anion gap: 12 (ref 5–15)
BUN: 19 mg/dL (ref 6–20)
CO2: 25 mmol/L (ref 22–32)
Calcium: 8.9 mg/dL (ref 8.9–10.3)
Chloride: 102 mmol/L (ref 98–111)
Creatinine, Ser: 0.76 mg/dL (ref 0.44–1.00)
GFR calc Af Amer: >60 mL/min (ref >60)
GFR calc non Af Amer: >60 mL/min (ref >60)
Glucose, Bld: 110 mg/dL — ABNORMAL HIGH (ref 70–99)
Potassium: 3.3 mmol/L — ABNORMAL LOW (ref 3.5–5.1)
Sodium: 139 mmol/L (ref 135–145)

## 2019-05-12 LAB — TROPONIN I (HIGH SENSITIVITY): Troponin I (High Sensitivity): 3 ng/L (ref <18)

## 2019-05-12 NOTE — ED Notes (Signed)
CALLED x 3 MORE THAN 5 MINUTES APART EACH TIME STARTING AT 1510.

## 2019-05-12 NOTE — ED Notes (Signed)
Pt pulled for repeat vitals and labs

## 2019-05-12 NOTE — ED Triage Notes (Signed)
Headache over last few days, chest pain to right breast this AM. States hx of both for years without definitive diagnosis. Pt alert and oriented X4, cooperative, RR even and unlabored, color WNL. Pt in NAD.

## 2019-05-13 ENCOUNTER — Telehealth: Payer: Self-pay | Admitting: Emergency Medicine

## 2019-05-13 NOTE — Telephone Encounter (Signed)
Called patient due to lwot to inquire about condition and follow up plans. Left message.   

## 2019-07-27 ENCOUNTER — Other Ambulatory Visit: Payer: Self-pay | Admitting: General Surgery

## 2019-07-28 LAB — SURGICAL PATHOLOGY
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
## 2019-09-21 ENCOUNTER — Encounter: Payer: Self-pay | Admitting: Pain Medicine

## 2019-09-21 NOTE — Progress Notes (Signed)
Safety precautions to be maintained throughout the outpatient stay will include: orient to surroundings, keep bed in low position, maintain call bell within reach at all times, provide assistance with transfer out of bed and ambulation.  

## 2019-09-22 ENCOUNTER — Other Ambulatory Visit: Payer: Self-pay

## 2019-09-22 ENCOUNTER — Telehealth: Payer: Self-pay

## 2019-09-22 ENCOUNTER — Ambulatory Visit: Payer: Medicaid Other | Attending: Pain Medicine | Admitting: Pain Medicine

## 2019-09-22 DIAGNOSIS — M25562 Pain in left knee: Secondary | ICD-10-CM

## 2019-09-22 DIAGNOSIS — M79604 Pain in right leg: Secondary | ICD-10-CM

## 2019-09-22 DIAGNOSIS — M899 Disorder of bone, unspecified: Secondary | ICD-10-CM | POA: Insufficient documentation

## 2019-09-22 DIAGNOSIS — Z419 Encounter for procedure for purposes other than remedying health state, unspecified: Secondary | ICD-10-CM

## 2019-09-22 DIAGNOSIS — G8929 Other chronic pain: Secondary | ICD-10-CM | POA: Insufficient documentation

## 2019-09-22 DIAGNOSIS — M25511 Pain in right shoulder: Secondary | ICD-10-CM

## 2019-09-22 DIAGNOSIS — M542 Cervicalgia: Secondary | ICD-10-CM | POA: Diagnosis not present

## 2019-09-22 DIAGNOSIS — G894 Chronic pain syndrome: Secondary | ICD-10-CM | POA: Insufficient documentation

## 2019-09-22 DIAGNOSIS — Z789 Other specified health status: Secondary | ICD-10-CM | POA: Insufficient documentation

## 2019-09-22 DIAGNOSIS — M25561 Pain in right knee: Secondary | ICD-10-CM | POA: Diagnosis not present

## 2019-09-22 DIAGNOSIS — M545 Low back pain, unspecified: Secondary | ICD-10-CM | POA: Insufficient documentation

## 2019-09-22 DIAGNOSIS — M79605 Pain in left leg: Secondary | ICD-10-CM

## 2019-09-22 DIAGNOSIS — Z79899 Other long term (current) drug therapy: Secondary | ICD-10-CM

## 2019-09-22 HISTORY — DX: Encounter for procedure for purposes other than remedying health state, unspecified: Z41.9

## 2019-09-28 ENCOUNTER — Ambulatory Visit
Admission: RE | Admit: 2019-09-28 | Discharge: 2019-09-28 | Disposition: A | Discharge status: Home / Self Care | Payer: Medicaid Other | Source: Ambulatory Visit | Attending: Pain Medicine | Admitting: Pain Medicine

## 2019-09-28 ENCOUNTER — Other Ambulatory Visit
Admission: RE | Admit: 2019-09-28 | Discharge: 2019-09-28 | Disposition: A | Discharge status: Home / Self Care | Payer: Medicaid Other | Source: Ambulatory Visit | Attending: Pain Medicine | Admitting: Pain Medicine

## 2019-09-28 DIAGNOSIS — M25561 Pain in right knee: Secondary | ICD-10-CM | POA: Insufficient documentation

## 2019-09-28 DIAGNOSIS — M25562 Pain in left knee: Secondary | ICD-10-CM | POA: Insufficient documentation

## 2019-09-28 DIAGNOSIS — G8929 Other chronic pain: Secondary | ICD-10-CM | POA: Diagnosis present

## 2019-09-28 DIAGNOSIS — M542 Cervicalgia: Secondary | ICD-10-CM

## 2019-09-28 DIAGNOSIS — M25511 Pain in right shoulder: Secondary | ICD-10-CM | POA: Insufficient documentation

## 2019-09-28 DIAGNOSIS — M545 Low back pain: Secondary | ICD-10-CM | POA: Insufficient documentation

## 2019-09-28 LAB — MAGNESIUM: Magnesium: 2.1 mg/dL (ref 1.7–2.4)

## 2019-09-28 LAB — COMPREHENSIVE METABOLIC PANEL
ALT: 19 U/L (ref 0–44)
AST: 18 U/L (ref 15–41)
Albumin: 4 g/dL (ref 3.5–5.0)
Alkaline Phosphatase: 74 U/L (ref 38–126)
Anion gap: 9 (ref 5–15)
BUN: 12 mg/dL (ref 6–20)
CO2: 20 mmol/L — ABNORMAL LOW (ref 22–32)
Calcium: 9.2 mg/dL (ref 8.9–10.3)
Chloride: 109 mmol/L (ref 98–111)
Creatinine, Ser: 0.71 mg/dL (ref 0.44–1.00)
GFR calc Af Amer: >60 mL/min (ref >60)
GFR calc non Af Amer: >60 mL/min (ref >60)
Glucose, Bld: 98 mg/dL (ref 70–99)
Potassium: 3.6 mmol/L (ref 3.5–5.1)
Sodium: 138 mmol/L (ref 135–145)
Total Bilirubin: 0.3 mg/dL (ref 0.3–1.2)
Total Protein: 6.9 g/dL (ref 6.5–8.1)

## 2019-09-28 LAB — VITAMIN B12: Vitamin B-12: 431 pg/mL (ref 180–914)

## 2019-09-28 LAB — URINE DRUG SCREEN, QUALITATIVE (ARMC ONLY)
Amphetamines, Ur Screen: NOT DETECTED
Barbiturates, Ur Screen: NOT DETECTED
Benzodiazepine, Ur Scrn: POSITIVE — AB
Cannabinoid 50 Ng, Ur ~~LOC~~: NOT DETECTED
Cocaine Metabolite,Ur ~~LOC~~: NOT DETECTED
MDMA (Ecstasy)Ur Screen: NOT DETECTED
Methadone Scn, Ur: NOT DETECTED
Opiate, Ur Screen: NOT DETECTED
Phencyclidine (PCP) Ur S: NOT DETECTED
Tricyclic, Ur Screen: NOT DETECTED

## 2019-09-28 LAB — C-REACTIVE PROTEIN: CRP: 1 mg/dL — ABNORMAL HIGH (ref <1.0)

## 2019-09-28 LAB — SEDIMENTATION RATE: Sed Rate: 10 mm/hr (ref 0–20)

## 2019-10-02 LAB — 25-HYDROXY VITAMIN D LCMS D2+D3
25-Hydroxy, Vitamin D-2: <1 ng/mL
25-Hydroxy, Vitamin D-3: 38 ng/mL
25-Hydroxy, Vitamin D: 38 ng/mL

## 2019-11-01 ENCOUNTER — Encounter: Payer: Self-pay | Admitting: Family Medicine

## 2020-01-26 ENCOUNTER — Encounter (HOSPITAL_COMMUNITY): Payer: Self-pay | Admitting: Emergency Medicine

## 2020-01-26 ENCOUNTER — Emergency Department (HOSPITAL_COMMUNITY): Payer: Medicaid Other

## 2020-01-26 ENCOUNTER — Other Ambulatory Visit: Payer: Self-pay

## 2020-01-26 ENCOUNTER — Emergency Department (HOSPITAL_COMMUNITY)
Admission: EM | Admit: 2020-01-26 | Discharge: 2020-01-26 | Disposition: A | Discharge status: Home / Self Care | Payer: Medicaid Other | Attending: Emergency Medicine | Admitting: Emergency Medicine

## 2020-01-26 DIAGNOSIS — J449 Chronic obstructive pulmonary disease, unspecified: Secondary | ICD-10-CM | POA: Diagnosis not present

## 2020-01-26 DIAGNOSIS — R1011 Right upper quadrant pain: Secondary | ICD-10-CM | POA: Insufficient documentation

## 2020-01-26 DIAGNOSIS — R945 Abnormal results of liver function studies: Secondary | ICD-10-CM | POA: Insufficient documentation

## 2020-01-26 DIAGNOSIS — F17210 Nicotine dependence, cigarettes, uncomplicated: Secondary | ICD-10-CM | POA: Insufficient documentation

## 2020-01-26 DIAGNOSIS — Z7951 Long term (current) use of inhaled steroids: Secondary | ICD-10-CM | POA: Diagnosis not present

## 2020-01-26 DIAGNOSIS — R110 Nausea: Secondary | ICD-10-CM | POA: Diagnosis not present

## 2020-01-26 DIAGNOSIS — R748 Abnormal levels of other serum enzymes: Secondary | ICD-10-CM

## 2020-01-26 DIAGNOSIS — R1013 Epigastric pain: Secondary | ICD-10-CM | POA: Diagnosis present

## 2020-01-26 DIAGNOSIS — Z79899 Other long term (current) drug therapy: Secondary | ICD-10-CM | POA: Insufficient documentation

## 2020-01-26 DIAGNOSIS — I10 Essential (primary) hypertension: Secondary | ICD-10-CM

## 2020-01-26 DIAGNOSIS — Z7982 Long term (current) use of aspirin: Secondary | ICD-10-CM | POA: Diagnosis not present

## 2020-01-26 DIAGNOSIS — N390 Urinary tract infection, site not specified: Secondary | ICD-10-CM | POA: Diagnosis not present

## 2020-01-26 LAB — COMPREHENSIVE METABOLIC PANEL
ALT: 49 U/L — ABNORMAL HIGH (ref 0–44)
AST: 118 U/L — ABNORMAL HIGH (ref 15–41)
Albumin: 4.1 g/dL (ref 3.5–5.0)
Alkaline Phosphatase: 104 U/L (ref 38–126)
Anion gap: 10 (ref 5–15)
BUN: 13 mg/dL (ref 6–20)
CO2: 27 mmol/L (ref 22–32)
Calcium: 9.1 mg/dL (ref 8.9–10.3)
Chloride: 103 mmol/L (ref 98–111)
Creatinine, Ser: 0.68 mg/dL (ref 0.44–1.00)
GFR calc Af Amer: >60 mL/min (ref >60)
GFR calc non Af Amer: >60 mL/min (ref >60)
Glucose, Bld: 117 mg/dL — ABNORMAL HIGH (ref 70–99)
Potassium: 3.3 mmol/L — ABNORMAL LOW (ref 3.5–5.1)
Sodium: 140 mmol/L (ref 135–145)
Total Bilirubin: 1.3 mg/dL — ABNORMAL HIGH (ref 0.3–1.2)
Total Protein: 6.9 g/dL (ref 6.5–8.1)

## 2020-01-26 LAB — URINALYSIS, ROUTINE W REFLEX MICROSCOPIC
Glucose, UA: NEGATIVE mg/dL
Ketones, ur: NEGATIVE mg/dL
Nitrite: POSITIVE — AB
Protein, ur: 100 mg/dL — AB
RBC / HPF: >50 RBC/hpf — ABNORMAL HIGH (ref 0–5)
Specific Gravity, Urine: 1.024 (ref 1.005–1.030)
WBC, UA: >50 WBC/hpf — ABNORMAL HIGH (ref 0–5)
pH: 6 (ref 5.0–8.0)

## 2020-01-26 LAB — CBC WITH DIFFERENTIAL/PLATELET
Abs Immature Granulocytes: 0.04 10*3/uL (ref 0.00–0.07)
Basophils Absolute: 0.1 10*3/uL (ref 0.0–0.1)
Basophils Relative: 1 %
Eosinophils Absolute: 0 10*3/uL (ref 0.0–0.5)
Eosinophils Relative: 0 %
HCT: 40.4 % (ref 36.0–46.0)
Hemoglobin: 13 g/dL (ref 12.0–15.0)
Immature Granulocytes: 1 %
Lymphocytes Relative: 7 %
Lymphs Abs: 0.6 10*3/uL — ABNORMAL LOW (ref 0.7–4.0)
MCH: 30.7 pg (ref 26.0–34.0)
MCHC: 32.2 g/dL (ref 30.0–36.0)
MCV: 95.3 fL (ref 80.0–100.0)
Monocytes Absolute: 0.5 10*3/uL (ref 0.1–1.0)
Monocytes Relative: 6 %
Neutro Abs: 7.4 10*3/uL (ref 1.7–7.7)
Neutrophils Relative %: 85 %
Platelets: 251 10*3/uL (ref 150–400)
RBC: 4.24 MIL/uL (ref 3.87–5.11)
RDW: 14.1 % (ref 11.5–15.5)
WBC: 8.6 10*3/uL (ref 4.0–10.5)
nRBC: 0 % (ref 0.0–0.2)

## 2020-01-26 LAB — LIPASE, BLOOD: Lipase: 26 U/L (ref 11–51)

## 2020-01-26 LAB — TROPONIN I (HIGH SENSITIVITY): Troponin I (High Sensitivity): 4 ng/L (ref <18)

## 2020-01-26 MED ORDER — IOHEXOL 300 MG/ML  SOLN
100.0000 mL | Freq: Once | INTRAMUSCULAR | Status: AC | PRN
  Administered 2020-01-26: 100 mL via INTRAVENOUS

## 2020-01-26 MED ORDER — FENTANYL CITRATE (PF) 100 MCG/2ML IJ SOLN
50.0000 ug | Freq: Once | INTRAMUSCULAR | Status: AC
  Administered 2020-01-26: 50 ug via INTRAVENOUS
  Filled 2020-01-26: qty 2

## 2020-01-26 MED ORDER — CEPHALEXIN 500 MG PO CAPS
500.0000 mg | ORAL_CAPSULE | Freq: Four times a day (QID) | ORAL | 0 refills | Status: DC
Start: 2020-01-26 — End: 2020-03-03

## 2020-01-26 MED ORDER — SUCRALFATE 1 GM/10ML PO SUSP
1.0000 g | Freq: Three times a day (TID) | ORAL | 0 refills | Status: DC
Start: 2020-01-26 — End: 2021-08-07

## 2020-01-26 MED ORDER — FAMOTIDINE 20 MG PO TABS
20.0000 mg | ORAL_TABLET | Freq: Two times a day (BID) | ORAL | 0 refills | Status: DC
Start: 2020-01-26 — End: 2021-08-07

## 2020-01-26 MED ORDER — CEPHALEXIN 500 MG PO CAPS
1000.0000 mg | ORAL_CAPSULE | Freq: Once | ORAL | Status: AC
  Administered 2020-01-26: 1000 mg via ORAL
  Filled 2020-01-26: qty 2

## 2020-01-26 MED ORDER — LACTATED RINGERS IV BOLUS
1000.0000 mL | Freq: Once | INTRAVENOUS | Status: AC
  Administered 2020-01-26: 1000 mL via INTRAVENOUS

## 2020-01-26 MED ORDER — PANTOPRAZOLE SODIUM 40 MG PO TBEC
40.0000 mg | DELAYED_RELEASE_TABLET | Freq: Once | ORAL | Status: AC
  Administered 2020-01-26: 40 mg via ORAL
  Filled 2020-01-26: qty 1

## 2020-01-26 MED ORDER — FAMOTIDINE 20 MG PO TABS
20.0000 mg | ORAL_TABLET | Freq: Once | ORAL | Status: AC
  Administered 2020-01-26: 20 mg via ORAL
  Filled 2020-01-26: qty 1

## 2020-01-26 MED ORDER — METOCLOPRAMIDE HCL 5 MG/ML IJ SOLN
10.0000 mg | Freq: Once | INTRAMUSCULAR | Status: AC
  Administered 2020-01-26: 10 mg via INTRAVENOUS
  Filled 2020-01-26: qty 2

## 2020-01-26 MED ORDER — ALUM & MAG HYDROXIDE-SIMETH 200-200-20 MG/5ML PO SUSP
30.0000 mL | Freq: Once | ORAL | Status: AC
  Administered 2020-01-26: 30 mL via ORAL
  Filled 2020-01-26: qty 30

## 2020-01-26 MED ORDER — ONDANSETRON 4 MG PO TBDP
ORAL_TABLET | ORAL | 0 refills | Status: DC
Start: 2020-01-26 — End: 2021-08-07

## 2020-01-26 NOTE — ED Triage Notes (Signed)
Pt started having epigastric pain about 9pm last night that has never resolved.

## 2020-01-26 NOTE — ED Provider Notes (Signed)
[facility] EMERGENCY DEPARTMENT Provider Note   CSN: [id number] Arrival date & time: 01/26/20  0343     History Chief Complaint  Patient presents with  . Abdominal Pain    Norma [name] is a 44 y.o. female.   Abdominal Pain Pain location:  Epigastric and RUQ Pain quality: aching and sharp   Pain radiates to:  Chest Pain severity:  Mild Duration:  5 hours Timing:  Constant Progression:  Unchanged Chronicity:  Recurrent Context: not alcohol use (not yesterday), not diet changes, not recent illness, not suspicious food intake and not trauma   Relieved by:  None tried Worsened by:  Nothing Ineffective treatments:  None tried Associated symptoms: nausea   Associated symptoms: no fever        Past Medical History:  Diagnosis Date  . AKI (acute kidney injury) (HCC) 06/04/2016  . Anxiety   . COPD (chronic obstructive pulmonary disease) (HCC)   . Depression   . History of kidney stones   . Hypertension   . IBS (irritable bowel syndrome)     Patient Active Problem List   Diagnosis Date Noted  . Chronic low back pain (1ry area of Pain) (Bilateral) (L>R) w/o sciatica 09/22/2019  . Cervicalgia 09/22/2019  . Chronic neck pain (2ry area of Pain) (Bilateral) (L>R) 09/22/2019  . Chronic lower extremity pain (3ry area of Pain) (Bilateral) (L>R) 09/22/2019  . Chronic knee pain (Bilateral) (L>R) 09/22/2019  . Chronic shoulder pain (Right) 09/22/2019  . Chronic pain syndrome 09/22/2019  . Pharmacologic therapy 09/22/2019  . Disorder of skeletal system 09/22/2019  . Problems influencing health status 09/22/2019  . Bacteremia 06/04/2016  . UTI (urinary tract infection) 06/04/2016  . Abdominal pain, epigastric 06/04/2016  . Nausea and vomiting 06/04/2016  . Hypokalemia 06/04/2016  . AKI (acute kidney injury) (HCC) 06/04/2016  . Positive blood culture 06/04/2016  . Diarrhea 06/04/2016  . Endometriosis of pelvis 11/16/2015  . 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 Rectal fissure 10/04/2015    Past Surgical History:  Procedure Laterality Date  . ABDOMINAL HYSTERECTOMY N/A 11/09/2015   Procedure: HYSTERECTOMY ABDOMINAL;  Surgeon: Tilda Burrow, MD;  Location: [facility];  Service: Gynecology;  Laterality: N/A;  . DILATION AND CURETTAGE OF UTERUS    . SALPINGOOPHORECTOMY Bilateral 11/09/2015   Procedure: SALPINGO OOPHORECTOMY;  Surgeon: Tilda Burrow, MD;  Location: [facility];  Service: Gynecology;  Laterality: Bilateral;     OB History    Gravida  2   Para  2   Term  2   Preterm      AB      Living  2     SAB      TAB      Ectopic      Multiple      Live Births              Family History  Problem Relation Age of Onset  . COPD Mother   . Diabetes Father   . Hypertension Father   . Heart disease Sister   . Heart disease Maternal Aunt   . Birth defects Daughter   . Kidney disease Daughter   . Bladder Cancer Neg Hx   . Kidney cancer Neg Hx     Social History   Tobacco Use  . Smoking status: Current Every Day Smoker    Packs/day: 1.50    Years: 19.00    Pack years: 28.50    Types: Cigarettes  .  Smokeless tobacco: Never Used  Vaping Use  . Vaping Use: Never used  Substance Use Topics  . Alcohol use: No    Alcohol/week: 0.0 standard drinks  . Drug use: No    Home Medications Prior to Admission medications   Medication Sig Start Date End Date Taking? Authorizing Provider  albuterol (PROVENTIL HFA;VENTOLIN HFA) 108 (90 BASE) MCG/ACT inhaler Inhale 2 puffs into the lungs every 6 (six) hours as needed for wheezing.    [provider]  Aspirin-Acetaminophen-Caffeine (GOODY HEADACHE PO) Take 1 Package by mouth daily as needed (pain).    [provider]  buPROPion (WELLBUTRIN XL) 150 MG 24 hr tablet Take 150 mg by mouth daily.    [provider]  cephALEXin (KEFLEX) 500 MG capsule Take 1 capsule (500 mg total) by mouth 4 (four) times daily.  01/26/20   Kennede Lusk, Barbara Cower, MD  Cholecalciferol (VITAMIN D3) 5000 units CAPS Take 1 capsule by mouth.     [provider]  cyclobenzaprine (FLEXERIL) 10 MG tablet Take 10 mg by mouth 3 (three) times daily as needed for muscle spasms.    [provider]  famotidine (PEPCID) 20 MG tablet Take 1 tablet (20 mg total) by mouth 2 (two) times daily. 01/26/20   Seng Larch, Barbara Cower, MD  ibuprofen (ADVIL) 600 MG tablet ibuprofen 600 mg tablet  TAKE 1 TABLET BY MOUTH THREE TIMES A DAY AS NEEDED    [provider]  naproxen (NAPROSYN) 500 MG tablet Take 1 tablet (500 mg total) by mouth 2 (two) times daily with a meal. 05/06/18   Triplett, Tammy, PA-C  ondansetron (ZOFRAN ODT) 4 MG disintegrating tablet 4mg  ODT q4 hours prn nausea/vomit 01/26/20   Mischa Brittingham, 01/28/20, MD  pantoprazole (PROTONIX) 40 MG tablet Take 40 mg by mouth daily.    [provider]  QVAR 80 MCG/ACT inhaler Inhale 2 puffs into the lungs daily. 09/19/15   [provider]  sucralfate (CARAFATE) 1 GM/10ML suspension Take 10 mLs (1 g total) by mouth 4 (four) times daily -  with meals and at bedtime. 01/26/20   Palestine Mosco, 01/28/20, MD    Allergies    Patient has no known allergies.  Review of Systems   Review of Systems  Constitutional: Negative for fever.  Gastrointestinal: Positive for abdominal pain and nausea.  All other systems reviewed and are negative.   Physical Exam Updated Vital Signs BP (!) 168/104   Pulse 92   Resp 18   Ht 5\' 2"  (1.575 m)   Wt 90.7 kg   LMP 09/24/2015   SpO2 98%   BMI 36.58 kg/m   Physical Exam Vitals and nursing note reviewed.  Constitutional:      Appearance: She is well-developed.  HENT:     Head: Normocephalic and atraumatic.     Nose: No congestion or rhinorrhea.     Mouth/Throat:     Mouth: Mucous membranes are moist.     Pharynx: Oropharynx is clear.  Eyes:     Pupils: Pupils are equal, round, and reactive to light.  Cardiovascular:     Rate and Rhythm: Normal  rate and regular rhythm.  Pulmonary:     Effort: Pulmonary effort is normal. No respiratory distress.     Breath sounds: No stridor.  Abdominal:     General: There is no distension.     Palpations: Abdomen is soft.     Tenderness: There is no abdominal tenderness.  Musculoskeletal:        General: No swelling  or tenderness. Normal range of motion.     Cervical back: Normal range of motion.  Skin:    General: Skin is warm and dry.  Neurological:     General: No focal deficit present.     Mental Status: She is alert.     ED Results / Procedures / Treatments   Labs (all labs ordered are listed, but only abnormal results are displayed) Labs Reviewed  CBC WITH DIFFERENTIAL/PLATELET - Abnormal; Notable for the following components:      Result Value   Lymphs Abs 0.6 (*)    All other components within normal limits  COMPREHENSIVE METABOLIC PANEL - Abnormal; Notable for the following components:   Potassium 3.3 (*)    Glucose, Bld 117 (*)    AST 118 (*)    ALT 49 (*)    Total Bilirubin 1.3 (*)    All other components within normal limits  URINALYSIS, ROUTINE W REFLEX MICROSCOPIC - Abnormal; Notable for the following components:   Color, Urine AMBER (*)    APPearance CLOUDY (*)    Hgb urine dipstick MODERATE (*)    Bilirubin Urine SMALL (*)    Protein, ur 100 (*)    Nitrite POSITIVE (*)    Leukocytes,Ua MODERATE (*)    RBC / HPF >50 (*)    WBC, UA >50 (*)    Bacteria, UA RARE (*)    All other components within normal limits  URINE CULTURE  LIPASE, BLOOD  TROPONIN I (HIGH SENSITIVITY)    EKG EKG Interpretation  Date/Time:  Wednesday January 26 2020 03:59:19 EDT Ventricular Rate:  78 PR Interval:    QRS Duration: 102 QT Interval:  379 QTC Calculation: 432 R Axis:   50 Text Interpretation: Sinus rhythm RSR' in V1 or V2, right VCD or RVH no significant change since 04/2019 Confirmed by Marily MemosMesner, Vayden Weinand [phone]([id number]) on 01/26/2020 4:49:12 AM   Radiology CT ABDOMEN PELVIS W  CONTRAST  Result Date: 01/26/2020 CLINICAL DATA:  Epigastric abdominal pain since last evening. EXAM: CT ABDOMEN AND PELVIS WITH CONTRAST TECHNIQUE: Multidetector CT imaging of the abdomen and pelvis was performed using the standard protocol following bolus administration of intravenous contrast. CONTRAST:  100mL OMNIPAQUE IOHEXOL 300 MG/ML  SOLN COMPARISON:  09/17/2017 FINDINGS: Lower chest: The lung bases are clear of acute process. No pleural effusion or pulmonary lesions. The heart is normal in size. No pericardial effusion. The distal esophagus and aorta are unremarkable. Hepatobiliary: No focal hepatic lesions or intrahepatic biliary dilatation. The gallbladder is normal. No common bile duct dilatation. Pancreas: No mass, inflammation or ductal dilatation. Spleen: Normal size. No focal lesions. Adrenals/Urinary Tract: The adrenal glands are normal. Bilateral renal calculi are noted but no obstructing ureteral calculi or bladder calculi. Renal cortical scarring changes involving the right kidney. No worrisome renal lesions. No collecting system abnormalities on the delayed images. The bladder is unremarkable. Stomach/Bowel: The stomach, duodenum, small bowel and colon are grossly normal without oral contrast. No inflammatory changes, mass lesions or obstructive findings. The appendix is normal. Vascular/Lymphatic: Age advanced atherosclerotic calcifications involving the aorta and iliac arteries but no aneurysm or dissection. The branch vessels are patent. The major venous structures are patent. No mesenteric or retroperitoneal mass or adenopathy. Small scattered lymph nodes are noted. Reproductive: Surgically absent. Other: No pelvic mass or adenopathy. No free pelvic fluid collections. No inguinal mass or adenopathy. No abdominal wall hernia or subcutaneous lesions. Musculoskeletal: No significant bony findings. IMPRESSION: 1. No acute abdominal/pelvic findings, mass lesions or  adenopathy. 2. Bilateral  renal calculi but no obstructing ureteral calculi or bladder calculi. 3. Age advanced atherosclerotic calcifications involving the aorta and iliac arteries. Aortic Atherosclerosis (ICD10-I70.0). Electronically Signed   By: Rudie Meyer M.D.   On: 01/26/2020 05:54    Procedures Procedures (including critical care time)  Medications Ordered in ED Medications  cephALEXin (KEFLEX) capsule 1,000 mg (has no administration in time range)  alum & mag hydroxide-simeth (MAALOX/MYLANTA) 200-200-20 MG/5ML suspension 30 mL (30 mLs Oral Given 01/26/20 0417)  famotidine (PEPCID) tablet 20 mg (20 mg Oral Given 01/26/20 0415)  pantoprazole (PROTONIX) EC tablet 40 mg (40 mg Oral Given 01/26/20 0416)  lactated ringers bolus 1,000 mL (1,000 mLs Intravenous New Bag/Given 01/26/20 0425)  metoCLOPramide (REGLAN) injection 10 mg (10 mg Intravenous Given 01/26/20 0423)  fentaNYL (SUBLIMAZE) injection 50 mcg (50 mcg Intravenous Given 01/26/20 0515)  iohexol (OMNIPAQUE) 300 MG/ML solution 100 mL (100 mLs Intravenous Contrast Given 01/26/20 0534)    ED Course  I have reviewed the triage vital signs and the nursing notes.  Pertinent labs & imaging results that were available during my care of the patient were reviewed by me and considered in my medical decision making (see chart for details).    MDM Rules/Calculators/A&P                         eval for pancreatitis, cholecystitis. Less likely ACS. Could be PUD vs gastritis as well, will start with treatment for those.  Found to have UTI. - may or may not be related as discomfort is a bit higher but will add culture and start abx Also with HTN - will need recheck by pcp for med changes Also with mildly elevaated ast/alt - not a chronic drinker but did drink some over the weekend - will abstain from etoh and tylenol until follow up with PCP for recheck of same.   Final Clinical Impression(s) / ED Diagnoses Final diagnoses:  Lower urinary tract infectious disease    Elevated liver enzymes  Hypertension, unspecified type    Rx / DC Orders ED Discharge Orders         Ordered    famotidine (PEPCID) 20 MG tablet  2 times daily     Discontinue  Reprint     01/26/20 0630    cephALEXin (KEFLEX) 500 MG capsule  4 times daily     Discontinue  Reprint     01/26/20 0630    ondansetron (ZOFRAN ODT) 4 MG disintegrating tablet     Discontinue  Reprint     01/26/20 0630    sucralfate (CARAFATE) 1 GM/10ML suspension  3 times daily with meals & bedtime     Discontinue  Reprint     01/26/20 0630           Akera Snowberger, Barbara Cower, MD 01/26/20 [phone]

## 2020-01-29 LAB — URINE CULTURE: Culture: >=100000 — AB

## 2020-01-30 ENCOUNTER — Telehealth: Payer: Self-pay | Admitting: Emergency Medicine

## 2020-01-30 NOTE — Telephone Encounter (Signed)
Post ED Visit - Positive Culture Follow-up: Successful Patient Follow-Up  Culture assessed and recommendations reviewed by:  []  , Pharm.D. []  Enzo Bi, Pharm.D., BCPS AQ-ID []  , Pharm.D., BCPS []  Celedonio Miyamoto, Pharm.D., BCPS []  [place], Garvin Fila.D., BCPS, AAHIVP []  , Pharm.D., BCPS, AAHIVP []  Georgina Pillion, PharmD, BCPS []  , PharmD, BCPS []  [place], PharmD, BCPS [x]  [place], PharmD  Positive urine culture  []  Patient discharged without antimicrobial prescription and treatment is now indicated [x]  Organism is resistant to prescribed ED discharge antimicrobial []  Patient with positive blood cultures  Changes discussed with ED provider: PA New antibiotic prescription: Nitrofurantoin 100 mg BID x five days Called to [facility] [phone]   Contacted patient, date 01/30/2020, time 1545   , RN 01/30/2020, 3:52 PM

## 2020-03-01 ENCOUNTER — Inpatient Hospital Stay (HOSPITAL_COMMUNITY)
Admission: EM | Admit: 2020-03-01 | Discharge: 2020-03-03 | DRG: 419 | Disposition: A | Discharge status: Home / Self Care | Payer: Medicaid Other | Attending: Internal Medicine | Admitting: Internal Medicine

## 2020-03-01 ENCOUNTER — Other Ambulatory Visit: Payer: Self-pay

## 2020-03-01 ENCOUNTER — Emergency Department (HOSPITAL_COMMUNITY): Payer: Medicaid Other

## 2020-03-01 ENCOUNTER — Observation Stay (HOSPITAL_COMMUNITY): Payer: Medicaid Other

## 2020-03-01 ENCOUNTER — Encounter (HOSPITAL_COMMUNITY): Payer: Self-pay

## 2020-03-01 DIAGNOSIS — Z8249 Family history of ischemic heart disease and other diseases of the circulatory system: Secondary | ICD-10-CM

## 2020-03-01 DIAGNOSIS — Z720 Tobacco use: Secondary | ICD-10-CM

## 2020-03-01 DIAGNOSIS — Z823 Family history of stroke: Secondary | ICD-10-CM

## 2020-03-01 DIAGNOSIS — E119 Type 2 diabetes mellitus without complications: Secondary | ICD-10-CM

## 2020-03-01 DIAGNOSIS — K8050 Calculus of bile duct without cholangitis or cholecystitis without obstruction: Secondary | ICD-10-CM | POA: Diagnosis not present

## 2020-03-01 DIAGNOSIS — Z7984 Long term (current) use of oral hypoglycemic drugs: Secondary | ICD-10-CM

## 2020-03-01 DIAGNOSIS — K8020 Calculus of gallbladder without cholecystitis without obstruction: Secondary | ICD-10-CM | POA: Diagnosis present

## 2020-03-01 DIAGNOSIS — R109 Unspecified abdominal pain: Secondary | ICD-10-CM

## 2020-03-01 DIAGNOSIS — I10 Essential (primary) hypertension: Secondary | ICD-10-CM

## 2020-03-01 DIAGNOSIS — J449 Chronic obstructive pulmonary disease, unspecified: Secondary | ICD-10-CM

## 2020-03-01 DIAGNOSIS — F17210 Nicotine dependence, cigarettes, uncomplicated: Secondary | ICD-10-CM | POA: Diagnosis present

## 2020-03-01 DIAGNOSIS — Z833 Family history of diabetes mellitus: Secondary | ICD-10-CM

## 2020-03-01 DIAGNOSIS — Z825 Family history of asthma and other chronic lower respiratory diseases: Secondary | ICD-10-CM

## 2020-03-01 DIAGNOSIS — Z6834 Body mass index (BMI) 34.0-34.9, adult: Secondary | ICD-10-CM

## 2020-03-01 DIAGNOSIS — K8000 Calculus of gallbladder with acute cholecystitis without obstruction: Principal | ICD-10-CM | POA: Diagnosis present

## 2020-03-01 DIAGNOSIS — E876 Hypokalemia: Secondary | ICD-10-CM | POA: Diagnosis present

## 2020-03-01 DIAGNOSIS — Z79899 Other long term (current) drug therapy: Secondary | ICD-10-CM

## 2020-03-01 DIAGNOSIS — K8021 Calculus of gallbladder without cholecystitis with obstruction: Secondary | ICD-10-CM

## 2020-03-01 DIAGNOSIS — Z419 Encounter for procedure for purposes other than remedying health state, unspecified: Secondary | ICD-10-CM

## 2020-03-01 DIAGNOSIS — Z20822 Contact with and (suspected) exposure to covid-19: Secondary | ICD-10-CM | POA: Diagnosis present

## 2020-03-01 DIAGNOSIS — E6609 Other obesity due to excess calories: Secondary | ICD-10-CM | POA: Diagnosis present

## 2020-03-01 DIAGNOSIS — K219 Gastro-esophageal reflux disease without esophagitis: Secondary | ICD-10-CM | POA: Diagnosis present

## 2020-03-01 HISTORY — DX: Calculus of bile duct without cholangitis or cholecystitis without obstruction: K80.50

## 2020-03-01 LAB — CBC
HCT: 39.7 % (ref 36.0–46.0)
Hemoglobin: 12.6 g/dL (ref 12.0–15.0)
MCH: 31 pg (ref 26.0–34.0)
MCHC: 31.7 g/dL (ref 30.0–36.0)
MCV: 97.5 fL (ref 80.0–100.0)
Platelets: 292 10*3/uL (ref 150–400)
RBC: 4.07 MIL/uL (ref 3.87–5.11)
RDW: 15 % (ref 11.5–15.5)
WBC: 7.7 10*3/uL (ref 4.0–10.5)
nRBC: 0 % (ref 0.0–0.2)

## 2020-03-01 LAB — BASIC METABOLIC PANEL
Anion gap: 9 (ref 5–15)
BUN: 11 mg/dL (ref 6–20)
CO2: 26 mmol/L (ref 22–32)
Calcium: 8.7 mg/dL — ABNORMAL LOW (ref 8.9–10.3)
Chloride: 106 mmol/L (ref 98–111)
Creatinine, Ser: 0.75 mg/dL (ref 0.44–1.00)
GFR calc Af Amer: >60 mL/min (ref >60)
GFR calc non Af Amer: >60 mL/min (ref >60)
Glucose, Bld: 97 mg/dL (ref 70–99)
Potassium: 3 mmol/L — ABNORMAL LOW (ref 3.5–5.1)
Sodium: 141 mmol/L (ref 135–145)

## 2020-03-01 LAB — HEMOGLOBIN A1C
Hgb A1c MFr Bld: 5.4 % (ref 4.8–5.6)
Mean Plasma Glucose: 108.28 mg/dL

## 2020-03-01 LAB — LIPASE, BLOOD: Lipase: 27 U/L (ref 11–51)

## 2020-03-01 LAB — TROPONIN I (HIGH SENSITIVITY)
Troponin I (High Sensitivity): 4 ng/L (ref <18)
Troponin I (High Sensitivity): 4 ng/L (ref <18)

## 2020-03-01 LAB — HEPATIC FUNCTION PANEL
ALT: 43 U/L (ref 0–44)
AST: 91 U/L — ABNORMAL HIGH (ref 15–41)
Albumin: 3.7 g/dL (ref 3.5–5.0)
Alkaline Phosphatase: 121 U/L (ref 38–126)
Bilirubin, Direct: 0.9 mg/dL — ABNORMAL HIGH (ref 0.0–0.2)
Indirect Bilirubin: 0.5 mg/dL (ref 0.3–0.9)
Total Bilirubin: 1.4 mg/dL — ABNORMAL HIGH (ref 0.3–1.2)
Total Protein: 6.7 g/dL (ref 6.5–8.1)

## 2020-03-01 LAB — SARS CORONAVIRUS 2 BY RT PCR (HOSPITAL ORDER, PERFORMED IN ~~LOC~~ HOSPITAL LAB): SARS Coronavirus 2: NEGATIVE

## 2020-03-01 LAB — GLUCOSE, CAPILLARY: Glucose-Capillary: 85 mg/dL (ref 70–99)

## 2020-03-01 LAB — CBG MONITORING, ED: Glucose-Capillary: 105 mg/dL — ABNORMAL HIGH (ref 70–99)

## 2020-03-01 MED ORDER — SODIUM CHLORIDE 0.9 % IV SOLN: INTRAVENOUS | Status: DC

## 2020-03-01 MED ORDER — GADOBUTROL 1 MMOL/ML IV SOLN
7.0000 mL | Freq: Once | INTRAVENOUS | Status: AC | PRN
  Administered 2020-03-01: 7 mL via INTRAVENOUS

## 2020-03-01 MED ORDER — SUCRALFATE 1 GM/10ML PO SUSP
1.0000 g | Freq: Three times a day (TID) | ORAL | Status: DC
  Administered 2020-03-02 (×5): 1 g via ORAL
  Filled 2020-03-01 (×6): qty 10

## 2020-03-01 MED ORDER — ONDANSETRON HCL 4 MG PO TABS
4.0000 mg | ORAL_TABLET | Freq: Four times a day (QID) | ORAL | Status: DC | PRN
  Administered 2020-03-03: 4 mg via ORAL
  Filled 2020-03-01: qty 1

## 2020-03-01 MED ORDER — ALBUTEROL SULFATE (2.5 MG/3ML) 0.083% IN NEBU: 3.0000 mL | INHALATION_SOLUTION | Freq: Four times a day (QID) | RESPIRATORY_TRACT | Status: DC | PRN

## 2020-03-01 MED ORDER — ONDANSETRON HCL 4 MG/2ML IJ SOLN: 4.0000 mg | Freq: Four times a day (QID) | INTRAMUSCULAR | Status: DC | PRN

## 2020-03-01 MED ORDER — BECLOMETHASONE DIPROPIONATE 80 MCG/ACT IN AERS: 2.0000 | INHALATION_SPRAY | Freq: Every day | RESPIRATORY_TRACT | Status: DC

## 2020-03-01 MED ORDER — INSULIN ASPART 100 UNIT/ML ~~LOC~~ SOLN: 0.0000 [IU] | Freq: Three times a day (TID) | SUBCUTANEOUS | Status: DC

## 2020-03-01 MED ORDER — FAMOTIDINE 20 MG PO TABS
20.0000 mg | ORAL_TABLET | Freq: Two times a day (BID) | ORAL | Status: DC
  Administered 2020-03-02 (×3): 20 mg via ORAL
  Filled 2020-03-01 (×3): qty 1

## 2020-03-01 MED ORDER — ONDANSETRON HCL 4 MG/2ML IJ SOLN
4.0000 mg | Freq: Once | INTRAMUSCULAR | Status: AC
  Administered 2020-03-01: 4 mg via INTRAVENOUS
  Filled 2020-03-01: qty 2

## 2020-03-01 MED ORDER — VITAMIN D (ERGOCALCIFEROL) 1.25 MG (50000 UNIT) PO CAPS: 50000.0000 [IU] | ORAL_CAPSULE | ORAL | Status: DC

## 2020-03-01 MED ORDER — MONTELUKAST SODIUM 10 MG PO TABS
10.0000 mg | ORAL_TABLET | Freq: Every day | ORAL | Status: DC
  Administered 2020-03-02 (×2): 10 mg via ORAL
  Filled 2020-03-01 (×2): qty 1

## 2020-03-01 MED ORDER — SODIUM CHLORIDE 0.9 % IV SOLN
2.0000 g | INTRAVENOUS | Status: DC
  Administered 2020-03-01 – 2020-03-02 (×2): 2 g via INTRAVENOUS
  Filled 2020-03-01 (×2): qty 20

## 2020-03-01 MED ORDER — BUDESONIDE 0.25 MG/2ML IN SUSP
0.2500 mg | Freq: Two times a day (BID) | RESPIRATORY_TRACT | Status: DC
  Administered 2020-03-02 (×2): 0.25 mg via RESPIRATORY_TRACT
  Filled 2020-03-01 (×3): qty 2

## 2020-03-01 MED ORDER — HEPARIN SODIUM (PORCINE) 5000 UNIT/ML IJ SOLN
5000.0000 [IU] | Freq: Three times a day (TID) | INTRAMUSCULAR | Status: DC
  Administered 2020-03-02 (×4): 5000 [IU] via SUBCUTANEOUS
  Filled 2020-03-01 (×4): qty 1

## 2020-03-01 MED ORDER — OXYCODONE HCL 5 MG PO TABS
5.0000 mg | ORAL_TABLET | ORAL | Status: DC | PRN
  Administered 2020-03-01 – 2020-03-03 (×8): 5 mg via ORAL
  Filled 2020-03-01 (×8): qty 1

## 2020-03-01 MED ORDER — INSULIN ASPART 100 UNIT/ML ~~LOC~~ SOLN: 0.0000 [IU] | Freq: Every day | SUBCUTANEOUS | Status: DC

## 2020-03-01 MED ORDER — POTASSIUM CHLORIDE CRYS ER 20 MEQ PO TBCR
40.0000 meq | EXTENDED_RELEASE_TABLET | Freq: Four times a day (QID) | ORAL | Status: AC
  Administered 2020-03-01 – 2020-03-02 (×2): 40 meq via ORAL
  Filled 2020-03-01 (×2): qty 2

## 2020-03-01 MED ORDER — QUETIAPINE FUMARATE 25 MG PO TABS
50.0000 mg | ORAL_TABLET | Freq: Every day | ORAL | Status: DC
  Administered 2020-03-02 (×2): 50 mg via ORAL
  Filled 2020-03-01 (×2): qty 2

## 2020-03-01 MED ORDER — HYDROMORPHONE HCL 1 MG/ML IJ SOLN
1.0000 mg | Freq: Once | INTRAMUSCULAR | Status: AC
  Administered 2020-03-01: 1 mg via INTRAVENOUS
  Filled 2020-03-01: qty 1

## 2020-03-01 NOTE — H&P (Signed)
TRH H&P   Patient Demographics:    Norma [name], is a 44 y.o. female  MRN: [id number]   DOB - [birth year]  Admit Date - 03/01/2020  Outpatient Primary MD for the patient is Lorelee Market, MD  Referring MD/NP/PA: Dr Rogene Houston  Patient coming from: Home  Chief Complaint  Patient presents with  . Chest Pain      HPI:    Norma [name]  is a 44 y.o. female, diabetes mellitus, hypertension, COPD, tobacco abuse, obesity, patient presents to ED secondary to epigastric pain, rating to right upper quadrant, noted yesterday, upon further questioning report this pain has been intermittent, for years, provoked by food, usually fatty food, but it has been more intense recently, as well she does report nausea and vomiting associated with this pain x2, currently reports nausea but no vomiting since presentation to ED, she denies fever, chills, diarrhea or constipation, no coffee-ground emesis. - in ED work-up significant for elevated at 91, total bili mildly elevated at 1.4, ALT and alk phos within normal limit, no leukocytosis, mild hypokalemia at 3,ultrasound showing cholelithiasis without wall thickening, bile duct measured 7 mm, but no stone could be seen, patient was started on IV fluids, GI and general surgery were consulted due to concerns of choledocholithiasis, triage hospitalist consulted to admit.    Review of systems:    In addition to the HPI above,  No Fever-chills, No Headache, No changes with Vision or hearing, No problems swallowing food or Liquids, No Chest pain, Cough or Shortness of Breath, Complains of abdominal pain and nausea, bowel movements are regular, No Blood in stool or Urine, No dysuria, No new skin rashes or bruises, No new joints pains-aches,  No new weakness, tingling, numbness in any extremity, No recent weight gain or loss, No polyuria,  polydypsia or polyphagia, No significant Mental Stressors.  A full 10 point Review of Systems was done, except as stated above, all other Review of Systems were negative.   With Past History of the following :    Past Medical History:  Diagnosis Date  . AKI (acute kidney injury) ([place]) 06/04/2016  . Anxiety   . COPD (chronic obstructive pulmonary disease) ([place])   . Depression   . History of kidney stones   . Hypertension   . IBS (irritable bowel syndrome)       Past Surgical History:  Procedure Laterality Date  . ABDOMINAL HYSTERECTOMY N/A 11/09/2015   Procedure: HYSTERECTOMY ABDOMINAL;  Surgeon: Jonnie Kind, MD;  Location: [facility];  Service: Gynecology;  Laterality: N/A;  . DILATION AND CURETTAGE OF UTERUS    . SALPINGOOPHORECTOMY Bilateral 11/09/2015   Procedure: SALPINGO OOPHORECTOMY;  Surgeon: Jonnie Kind, MD;  Location: [facility];  Service: Gynecology;  Laterality: Bilateral;      Social History:     Social History   Tobacco Use  . Smoking status: Current Every Day  Smoker    Packs/day: 1.50    Years: 19.00    Pack years: 28.50    Types: Cigarettes  . Smokeless tobacco: Never Used  Substance Use Topics  . Alcohol use: No    Alcohol/week: 0.0 standard drinks        Family History :     Family History  Problem Relation Age of Onset  . COPD Mother   . Diabetes Father   . Hypertension Father   . Heart disease Sister   . Heart disease Maternal Aunt   . Birth defects Daughter   . Kidney disease Daughter   . Bladder Cancer Neg Hx   . Kidney cancer Neg Hx      Home Medications:   Prior to Admission medications   Medication Sig Start Date End Date Taking? Authorizing Provider  albuterol (PROVENTIL HFA;VENTOLIN HFA) 108 (90 BASE) MCG/ACT inhaler Inhale 2 puffs into the lungs every 6 (six) hours as needed for wheezing.   Yes [provider]  amLODipine (NORVASC) 5 MG tablet Take 5 mg by mouth daily. 11/18/19  Yes [provider]    benzonatate (TESSALON) 200 MG capsule Take 1 capsule by mouth 3 (three) times daily as needed. 11/18/19  Yes [provider]  ciprofloxacin (CIPRO) 500 MG tablet Take 500 mg by mouth 2 (two) times daily. 02/24/20  Yes [provider]  famotidine (PEPCID) 20 MG tablet Take 1 tablet (20 mg total) by mouth 2 (two) times daily. 01/26/20  Yes Mesner, Corene Cornea, MD  ibuprofen (ADVIL) 800 MG tablet Take 800 mg by mouth 3 (three) times daily. 11/18/19  Yes [provider]  metFORMIN (GLUCOPHAGE) 500 MG tablet Take 500 mg by mouth daily. 11/18/19  Yes [provider]  montelukast (SINGULAIR) 10 MG tablet Take 10 mg by mouth daily as needed. 11/18/19  Yes [provider]  ondansetron (ZOFRAN ODT) 4 MG disintegrating tablet 3m ODT q4 hours prn nausea/vomit 01/26/20  Yes Mesner, JCorene Cornea MD  QUEtiapine (SEROQUEL) 50 MG tablet Take 50 mg by mouth at bedtime. 01/31/20  Yes [provider]  QVAR 80 MCG/ACT inhaler Inhale 2 puffs into the lungs daily. 09/19/15  Yes [provider]  sucralfate (CARAFATE) 1 GM/10ML suspension Take 10 mLs (1 g total) by mouth 4 (four) times daily -  with meals and at bedtime. 01/26/20  Yes Mesner, JCorene Cornea MD  Vitamin D, Ergocalciferol, (DRISDOL) 1.25 MG (50000 UNIT) CAPS capsule Take 50,000 Units by mouth every 7 (seven) days. fridays   Yes [provider]  cephALEXin (KEFLEX) 500 MG capsule Take 1 capsule (500 mg total) by mouth 4 (four) times daily. Patient not taking: Reported on 03/01/2020 01/26/20   Mesner, JCorene Cornea MD     Allergies:    No Known Allergies   Physical Exam:   Vitals  Blood pressure (!) 146/95, pulse 70, temperature 98.3 F (36.8 C), temperature source Oral, resp. rate 16, height 5' 2"  (1.575 m), weight 86.2 kg, last menstrual period 09/24/2015, SpO2 97 %.   1. General well developed female lying in bed in NAD,    2. Normal affect and insight, Not Suicidal or Homicidal, Awake Alert, Oriented X 3.  3. No  F.N deficits, ALL C.Nerves Intact, Strength 5/5 all 4 extremities, Sensation intact all 4 extremities, Plantars down going.  4. Ears and Eyes appear Normal, Conjunctivae clear, PERRLA. Moist Oral Mucosa.  5. Supple Neck, No JVD, No cervical lymphadenopathy appriciated, No Carotid Bruits.  6. Symmetrical Chest wall movement, Good  air movement bilaterally, CTAB.  7. RRR, No Gallops, Rubs or Murmurs, No Parasternal Heave.  8. Positive Bowel Sounds, Abdomen Soft, RUQ tenderness  tenderness, No organomegaly appriciated,No rebound -guarding or rigidity.  9.  No Cyanosis, Normal Skin Turgor, No Skin Rash or Bruise.  10. Good muscle tone,  joints appear normal , no effusions, Normal ROM.  11. No Palpable Lymph Nodes in Neck or Axillae     Data Review:    CBC Recent Labs  Lab 03/01/20 1110  WBC 7.7  HGB 12.6  HCT 39.7  PLT 292  MCV 97.5  MCH 31.0  MCHC 31.7  RDW 15.0   ------------------------------------------------------------------------------------------------------------------  Chemistries  Recent Labs  Lab 03/01/20 1110  NA 141  K 3.0*  CL 106  CO2 26  GLUCOSE 97  BUN 11  CREATININE 0.75  CALCIUM 8.7*  AST 91*  ALT 43  ALKPHOS 121  BILITOT 1.4*   ------------------------------------------------------------------------------------------------------------------ estimated creatinine clearance is 92.3 mL/min (by C-G formula based on SCr of 0.75 mg/dL). ------------------------------------------------------------------------------------------------------------------ No results for input(s): TSH, T4TOTAL, T3FREE, THYROIDAB in the last 72 hours.  Invalid input(s): FREET3  Coagulation profile No results for input(s): INR, PROTIME in the last 168 hours. ------------------------------------------------------------------------------------------------------------------- No results for input(s): DDIMER in the last 72  hours. -------------------------------------------------------------------------------------------------------------------  Cardiac Enzymes No results for input(s): CKMB, TROPONINI, MYOGLOBIN in the last 168 hours.  Invalid input(s): CK ------------------------------------------------------------------------------------------------------------------ No results found for: BNP   ---------------------------------------------------------------------------------------------------------------  Urinalysis    Component Value Date/Time   COLORURINE AMBER (A) 01/26/2020 0408   APPEARANCEUR CLOUDY (A) 01/26/2020 0408   APPEARANCEUR Cloudy (A) 08/29/2017 1422   LABSPEC 1.024 01/26/2020 0408   PHURINE 6.0 01/26/2020 0408   GLUCOSEU NEGATIVE 01/26/2020 0408   HGBUR MODERATE (A) 01/26/2020 0408   BILIRUBINUR SMALL (A) 01/26/2020 0408   BILIRUBINUR Negative 08/29/2017 1422   KETONESUR NEGATIVE 01/26/2020 0408   PROTEINUR 100 (A) 01/26/2020 0408   UROBILINOGEN 0.2 07/04/2014 1210   NITRITE POSITIVE (A) 01/26/2020 0408   LEUKOCYTESUR MODERATE (A) 01/26/2020 0408    ----------------------------------------------------------------------------------------------------------------   Imaging Results:    DG Chest 2 View  Result Date: 03/01/2020 CLINICAL DATA:  Chest pain. EXAM: CHEST - 2 VIEW COMPARISON:  Prior chest radiographs 05/12/2019 and earlier FINDINGS: Heart size within normal limits. No appreciable airspace consolidation or pulmonary edema. No evidence of pleural effusion or pneumothorax. No acute bony abnormality identified. IMPRESSION: No evidence of active cardiopulmonary disease. Electronically Signed   By: Kellie Simmering DO   On: 03/01/2020 10:52   US Abdomen Complete  Result Date: 03/01/2020 CLINICAL DATA:  44 year old female with generalized abdominal pain for 1 day. EXAM: ABDOMEN ULTRASOUND COMPLETE COMPARISON:  CT Abdomen and Pelvis 01/26/2020, and earlier. FINDINGS: Gallbladder:  Multiple small echogenic foci within the lumen compatible with gallstones, the largest about 8 mm (image 12). Some of the smaller foci might also be tiny gallbladder polyps (inconsequential). No gallbladder wall thickening. No pericholecystic fluid. Equivocal sonographic Murphy sign. Common bile duct: Diameter: 7 mm, mildly dilated. No filling defect identified within the visible duct. Liver: Liver echogenicity within normal limits. Mildly coarse hepatic echotexture. No discrete liver lesion. No intrahepatic biliary ductal dilatation. Portal vein is patent on color Doppler imaging with normal direction of blood flow towards the liver. IVC: No abnormality visualized. Pancreas: Visualized portion unremarkable. Spleen: Size and appearance within normal limits. Right Kidney: Length: 9.8 cm. No hydronephrosis. Stable mildly lobulated liver contour. Nephrolithiasis is again evident in the lower pole (image 90). Left Kidney:  Length: 9.3 cm. Echogenicity within normal limits. No mass or hydronephrosis visualized. Abdominal aorta: Incompletely visualized due to overlying bowel gas, visualized portions within normal limits. Other findings: None. IMPRESSION: 1. Positive for multiple small gallstones, and a mildly dilated CBD - suspicious for Choledocholithiasis. 2. No evidence of acute cholecystitis. 3. Chronic nephrolithiasis. Electronically Signed   By: Genevie Ann M.D.   On: 03/01/2020 13:23      Assessment & Plan:    Active Problems:   HTN (hypertension)   Diabetes ([employer])   Choledocholithiasis  Choledocholithiasis -Presents with postprandial abdominal pain, nausea and vomiting, intermittent symptoms over the years but has worsened recently. -Work-up significant for mildly elevated transaminases, and ultrasound reveals cholelithiasis and bile duct at the upper limit of normal, but no stones can be seen in the bile duct. -High clinical suspicion for choledocholithiasis, GI consulted regarding further  recommendations, plan to proceed for MRCP before considering ERCP. -General surgery as well consulted for laparoscopic cholecystectomy once cleared by GI. -Continue with IV Rocephin per GI recommendation -As needed pain medications and IV fluids and clear liquid diets.  Diabetes mellitus -hold Oral regimen, will keep on insulin sliding scale during hospital stay  Hypertension -Continue with home medication  Hypokalemia -Repleted, recheck in a.m.  Tobacco abuse -Counseled, reports she is down to 2 cigarettes/day, currently she does not want nicotine patch  COPD -No wheezing, continue with home medications and as needed albuterol  DVT Prophylaxis Heparin - SCDs   AM Labs Ordered, also please review Full Orders  Family Communication: Admission, patients condition and plan of care including tests being ordered have been discussed with the patient who indicate understanding and agree with the plan and Code Status.  Code Status Full  Likely DC to Home  Condition GUARDED    Consults called GI, Surgery  Admission status: observation  Time spent in minutes : 60 minutes   Phillips Climes M.D on 03/01/2020 at 3:44 PM   [facility] - Office  [phone]

## 2020-03-01 NOTE — ED Provider Notes (Signed)
[facility]   CSN: [id number] Arrival date & time: 03/01/20  1003     History Chief Complaint  Patient presents with   Chest Pain    Norma [name] is a 44 y.o. female.  Patient with complaint of some lower substernal epigastric and right upper quadrant abdominal pain since yesterday.  Is been constant.  Associated with nausea and vomiting x2.  No fevers.  No diarrhea.        Past Medical History:  Diagnosis Date   AKI (acute kidney injury) ([facility]) 06/04/2016   Anxiety    COPD (chronic obstructive pulmonary disease) ([facility])    Depression    History of kidney stones    Hypertension    IBS (irritable bowel syndrome)     Patient Active Problem List   Diagnosis Date Noted   HTN (hypertension) 03/01/2020   Diabetes ([facility]) 03/01/2020   Choledocholithiasis 03/01/2020   Chronic low back pain (1ry area of Pain) (Bilateral) (L>R) w/o sciatica 09/22/2019   Cervicalgia 09/22/2019   Chronic neck pain (2ry area of Pain) (Bilateral) (L>R) 09/22/2019   Chronic lower extremity pain (3ry area of Pain) (Bilateral) (L>R) 09/22/2019   Chronic knee pain (Bilateral) (L>R) 09/22/2019   Chronic shoulder pain (Right) 09/22/2019   Chronic pain syndrome 09/22/2019   Pharmacologic therapy 09/22/2019   Disorder of skeletal system 09/22/2019   Problems influencing health status 09/22/2019   Bacteremia 06/04/2016   UTI (urinary tract infection) 06/04/2016   Abdominal pain, epigastric 06/04/2016   Nausea and vomiting 06/04/2016   Hypokalemia 06/04/2016   AKI (acute kidney injury) ([facility]) 06/04/2016   Positive blood culture 06/04/2016   Diarrhea 06/04/2016   Endometriosis of pelvis 11/16/2015   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 11/09/2015   Rectal fissure 10/04/2015    Past Surgical History:  Procedure Laterality Date   ABDOMINAL HYSTERECTOMY N/A 11/09/2015   Procedure:  HYSTERECTOMY ABDOMINAL;  Surgeon: Tilda BurrowJohn Ferguson V, MD;  Location: [facility];  Service: Gynecology;  Laterality: N/A;   DILATION AND CURETTAGE OF UTERUS     SALPINGOOPHORECTOMY Bilateral 11/09/2015   Procedure: SALPINGO OOPHORECTOMY;  Surgeon: Tilda BurrowJohn Ferguson V, MD;  Location: [facility];  Service: Gynecology;  Laterality: Bilateral;     OB History    Gravida  2   Para  2   Term  2   Preterm      AB      Living  2     SAB      TAB      Ectopic      Multiple      Live Births              Family History  Problem Relation Age of Onset   COPD Mother    Diabetes Father    Hypertension Father    Heart disease Sister    Heart disease Maternal Aunt    Birth defects Daughter    Kidney disease Daughter    Bladder Cancer Neg Hx    Kidney cancer Neg Hx     Social History   Tobacco Use   Smoking status: Current Every Day Smoker    Packs/day: 1.50    Years: 19.00    Pack years: 28.50    Types: Cigarettes   Smokeless tobacco: Never Used  Building services engineerVaping Use   Vaping Use: Never used  Substance Use Topics   Alcohol use: No    Alcohol/week: 0.0 standard drinks   Drug use:  No    Home Medications Prior to Admission medications   Medication Sig Start Date End Date Taking? Authorizing Provider  albuterol (PROVENTIL HFA;VENTOLIN HFA) 108 (90 BASE) MCG/ACT inhaler Inhale 2 puffs into the lungs every 6 (six) hours as needed for wheezing.   Yes [provider]  amLODipine (NORVASC) 5 MG tablet Take 5 mg by mouth daily. 11/18/19  Yes [provider]  benzonatate (TESSALON) 200 MG capsule Take 1 capsule by mouth 3 (three) times daily as needed. 11/18/19  Yes [provider]  ciprofloxacin (CIPRO) 500 MG tablet Take 500 mg by mouth 2 (two) times daily. 02/24/20  Yes [provider]  famotidine (PEPCID) 20 MG tablet Take 1 tablet (20 mg total) by mouth 2 (two) times daily. 01/26/20  Yes Mesner, Barbara Cower, MD  ibuprofen (ADVIL) 800 MG tablet Take 800 mg  by mouth 3 (three) times daily. 11/18/19  Yes [provider]  metFORMIN (GLUCOPHAGE) 500 MG tablet Take 500 mg by mouth daily. 11/18/19  Yes [provider]  montelukast (SINGULAIR) 10 MG tablet Take 10 mg by mouth daily as needed. 11/18/19  Yes [provider]  ondansetron (ZOFRAN ODT) 4 MG disintegrating tablet 4mg  ODT q4 hours prn nausea/vomit 01/26/20  Yes Mesner, 01/28/20, MD  QUEtiapine (SEROQUEL) 50 MG tablet Take 50 mg by mouth at bedtime. 01/31/20  Yes [provider]  QVAR 80 MCG/ACT inhaler Inhale 2 puffs into the lungs daily. 09/19/15  Yes [provider]  sucralfate (CARAFATE) 1 GM/10ML suspension Take 10 mLs (1 g total) by mouth 4 (four) times daily -  with meals and at bedtime. 01/26/20  Yes Mesner, 01/28/20, MD  Vitamin D, Ergocalciferol, (DRISDOL) 1.25 MG (50000 UNIT) CAPS capsule Take 50,000 Units by mouth every 7 (seven) days. fridays   Yes [provider]  cephALEXin (KEFLEX) 500 MG capsule Take 1 capsule (500 mg total) by mouth 4 (four) times daily. Patient not taking: Reported on 03/01/2020 01/26/20   Mesner, 01/28/20, MD    Allergies    Patient has no known allergies.  Review of Systems   Review of Systems  Constitutional: Negative for chills and fever.  HENT: Negative for rhinorrhea and sore throat.   Eyes: Negative for visual disturbance.  Respiratory: Negative for cough and shortness of breath.   Cardiovascular: Negative for chest pain and leg swelling.  Gastrointestinal: Positive for abdominal pain, nausea and vomiting. Negative for diarrhea.  Genitourinary: Negative for dysuria and hematuria.  Musculoskeletal: Negative for back pain and neck pain.  Skin: Negative for rash.  Neurological: Negative for dizziness, light-headedness and headaches.  Hematological: Does not bruise/bleed easily.  Psychiatric/Behavioral: Negative for confusion.    Physical Exam Updated Vital Signs BP (!) 146/95    Pulse 70    Temp 98.3 F (36.8 C)  (Oral)    Resp 16    Ht 1.575 m (5\' 2" )    Wt 86.2 kg    LMP 09/24/2015    SpO2 97%    BMI 34.75 kg/m   Physical Exam Vitals and nursing note reviewed.  Constitutional:      General: She is not in acute distress.    Appearance: Normal appearance. She is well-developed.  HENT:     Head: Normocephalic and atraumatic.  Eyes:     Extraocular Movements: Extraocular movements intact.     Conjunctiva/sclera: Conjunctivae normal.     Pupils: Pupils are equal, round, and reactive to light.  Cardiovascular:     Rate and Rhythm: Normal  rate and regular rhythm.     Heart sounds: No murmur heard.   Pulmonary:     Effort: Pulmonary effort is normal. No respiratory distress.     Breath sounds: Normal breath sounds.  Abdominal:     Palpations: Abdomen is soft.     Tenderness: There is abdominal tenderness.  Musculoskeletal:     Cervical back: Normal range of motion and neck supple.  Skin:    General: Skin is warm and dry.     Capillary Refill: Capillary refill takes less than 2 seconds.  Neurological:     General: No focal deficit present.     Mental Status: She is alert and oriented to person, place, and time.     Cranial Nerves: No cranial nerve deficit.     Sensory: No sensory deficit.     Motor: No weakness.     ED Results / Procedures / Treatments   Labs (all labs ordered are listed, but only abnormal results are displayed) Labs Reviewed  BASIC METABOLIC PANEL - Abnormal; Notable for the following components:      Result Value   Potassium 3.0 (*)    Calcium 8.7 (*)    All other components within normal limits  HEPATIC FUNCTION PANEL - Abnormal; Notable for the following components:   AST 91 (*)    Total Bilirubin 1.4 (*)    Bilirubin, Direct 0.9 (*)    All other components within normal limits  SARS CORONAVIRUS 2 BY RT PCR (HOSPITAL ORDER, PERFORMED IN [facility])  CBC  LIPASE, BLOOD  HEMOGLOBIN A1C  TROPONIN I (HIGH SENSITIVITY)  TROPONIN I (HIGH  SENSITIVITY)    EKG EKG Interpretation  Date/Time:  Wednesday March 01 2020 10:16:56 EDT Ventricular Rate:  80 PR Interval:  156 QRS Duration: 82 QT Interval:  378 QTC Calculation: 435 R Axis:   29 Text Interpretation: Normal sinus rhythm Normal ECG Confirmed by Vanetta Mulders [phone]) on 03/01/2020 11:31:38 AM   Radiology DG Chest 2 View  Result Date: 03/01/2020 CLINICAL DATA:  Chest pain. EXAM: CHEST - 2 VIEW COMPARISON:  Prior chest radiographs 05/12/2019 and earlier FINDINGS: Heart size within normal limits. No appreciable airspace consolidation or pulmonary edema. No evidence of pleural effusion or pneumothorax. No acute bony abnormality identified. IMPRESSION: No evidence of active cardiopulmonary disease. Electronically Signed   By: Jackey Loge DO   On: 03/01/2020 10:52   US Abdomen Complete  Result Date: 03/01/2020 CLINICAL DATA:  44 year old female with generalized abdominal pain for 1 day. EXAM: ABDOMEN ULTRASOUND COMPLETE COMPARISON:  CT Abdomen and Pelvis 01/26/2020, and earlier. FINDINGS: Gallbladder: Multiple small echogenic foci within the lumen compatible with gallstones, the largest about 8 mm (image 12). Some of the smaller foci might also be tiny gallbladder polyps (inconsequential). No gallbladder wall thickening. No pericholecystic fluid. Equivocal sonographic Murphy sign. Common bile duct: Diameter: 7 mm, mildly dilated. No filling defect identified within the visible duct. Liver: Liver echogenicity within normal limits. Mildly coarse hepatic echotexture. No discrete liver lesion. No intrahepatic biliary ductal dilatation. Portal vein is patent on color Doppler imaging with normal direction of blood flow towards the liver. IVC: No abnormality visualized. Pancreas: Visualized portion unremarkable. Spleen: Size and appearance within normal limits. Right Kidney: Length: 9.8 cm. No hydronephrosis. Stable mildly lobulated liver contour. Nephrolithiasis is again evident in  the lower pole (image 90). Left Kidney: Length: 9.3 cm. Echogenicity within normal limits. No mass or hydronephrosis visualized. Abdominal aorta: Incompletely visualized due to overlying  bowel gas, visualized portions within normal limits. Other findings: None. IMPRESSION: 1. Positive for multiple small gallstones, and a mildly dilated CBD - suspicious for Choledocholithiasis. 2. No evidence of acute cholecystitis. 3. Chronic nephrolithiasis. Electronically Signed   By: Odessa Fleming M.D.   On: 03/01/2020 13:23    Procedures Procedures (including critical care time)  Medications Ordered in ED Medications  0.9 %  sodium chloride infusion ( Intravenous New Bag/Given 03/01/20 1301)  oxyCODONE (Oxy IR/ROXICODONE) immediate release tablet 5 mg (5 mg Oral Given 03/01/20 1534)  insulin aspart (novoLOG) injection 0-15 Units (has no administration in time range)  insulin aspart (novoLOG) injection 0-5 Units (has no administration in time range)  ondansetron (ZOFRAN) injection 4 mg (4 mg Intravenous Given 03/01/20 1255)  HYDROmorphone (DILAUDID) injection 1 mg (1 mg Intravenous Given 03/01/20 1254)    ED Course  I have reviewed the triage vital signs and the nursing notes.  Pertinent labs & imaging results that were available during my care of the patient were reviewed by me and considered in my medical decision making (see chart for details).    MDM Rules/Calculators/A&P                          Patient without evidence of acute cholecystitis.  No leukocytosis.  Does have an elevated bilirubin.  Does have a marginal gallbladder common bile duct size.  Radiology raises concerns about possible gallstone in the gallbladder.  Discussed with Dr. Lovell Sheehan general surgery as well as Dr. Dionicia Abler from gastroenterology.  Both are recommending medicine admission.  Patient may require ERCP.  Hospitalist will admit.  Patient's liver function tests otherwise without significant abnormalities.    Final Clinical  Impression(s) / ED Diagnoses Final diagnoses:  Calculus of gallbladder with biliary obstruction but without cholecystitis    Rx / DC Orders ED Discharge Orders    None       Vanetta Mulders, MD 03/01/20 1547

## 2020-03-01 NOTE — ED Notes (Signed)
IVF's on hold for MRI

## 2020-03-01 NOTE — ED Triage Notes (Signed)
Pt began having chest pain yesterday. It is in the center and she describes pain as sharp. Denies any cardiac issues. States she does have back pain that also started yesterday.

## 2020-03-01 NOTE — Consult Note (Signed)
Referring Provider: Vanetta Mulders, MD and  Huey Bienenstock, MD Primary Care Physician:  Evelene Croon, MD Primary Gastroenterologist:  Dr. Karilyn Cota  Reason for Consultation:    Cholelithiasis.  Elevated transaminases.  HPI:   Patient is 44 year old Caucasian female who presented to emergency room this morning with complaints of chest and right upper quadrant pain which started about an hour after her evening meal.  She noted chills.  She had nausea and vomiting but no fever.  Patient states she has had similar may be less intense pain off and on for the past few years.  She previously had been told it was due to GERD.  However patient was not getting better.  She therefore came to emergency room.  On further evaluation she was noted to have elevated AST and ALT and ultrasound some revealed cholelithiasis without wall thickening.  Bile duct measured 7 mm but there were no stones evident in the bile duct.  Patient also had troponin level x2 and is within normal limits.  Patient reports significant decrease in pain intensity with pain medication.  Patient also was evaluated in this emergency room on January 26, 2020 with epigastric pain.  She had abdominal pelvic CT during that visit.  She had bilateral renal calculi without obstruction.  She also had atherosclerotic changes to the aorta and iliac arteries. Patient was treated and discharged. Patient is on OTC Nexium and famotidine with satisfactory control of her heartburn.  She denies dysphagia.  She also denies melena or  frank rectal bleeding.  She is prone to constipation and occasionally she may noted spotting on the tissue.  She has good appetite and she denies weight loss.  She says she was diagnosed with diabetes mellitus 3 months ago.  She is watching her diet. Patient states she was tested for hepatitis C few years ago and the test was negative. No history of elevated transaminases in the past.  She is presently not working.  She is  widowed.  She lost her husband in 2017.  She has 2 children.  She has a son and her daughter.  Her daughter was born with 1 kidney.  She is doing fine.  Patient says she used to smoke 1 to 1-1/2 pack/day.  She started to smoke when she is 44 years old.  She is finally try to quit.  Now she may have a cigarette or 2 every couple of days.  She made a change 3 months ago.  She drinks alcohol socially but not daily.  Father was diabetic hypertensive and died of a stroke at age 43. Mother had COPD and lived to be 94. She lost her brother at age 43 because of massive upper GI bleed due to what appears to be Mallory-Weiss tear.  He drank too much alcohol.  She has a sister who had V. tach at age 43 and she is now 43 years old and doing well.  She has 3 half-sisters who are in good health.    Past Medical History:  Diagnosis Date  . AKI (acute kidney injury) (HCC) 06/04/2016  . Anxiety   . COPD (chronic obstructive pulmonary disease) (HCC)   . Depression   . History of kidney stones   . Hypertension   . IBS (irritable bowel syndrome)        Diabetes mellitus.      Obesity.  Past Surgical History:  Procedure Laterality Date  . ABDOMINAL HYSTERECTOMY N/A 11/09/2015   Procedure: HYSTERECTOMY ABDOMINAL;  Surgeon: Christin Bach  V, MD;  Location: [facility];  Service: Gynecology;  Laterality: N/A;  . DILATION AND CURETTAGE OF UTERUS    . SALPINGOOPHORECTOMY Bilateral 11/09/2015   Procedure: SALPINGO OOPHORECTOMY;  Surgeon: Tilda Burrow, MD;  Location: [facility];  Service: Gynecology;  Laterality: Bilateral;    Prior to Admission medications   Medication Sig Start Date End Date Taking? Authorizing Provider  albuterol (PROVENTIL HFA;VENTOLIN HFA) 108 (90 BASE) MCG/ACT inhaler Inhale 2 puffs into the lungs every 6 (six) hours as needed for wheezing.   Yes [provider]  amLODipine (NORVASC) 5 MG tablet Take 5 mg by mouth daily. 11/18/19  Yes [provider]  benzonatate (TESSALON) 200 MG  capsule Take 1 capsule by mouth 3 (three) times daily as needed. 11/18/19  Yes [provider]  ciprofloxacin (CIPRO) 500 MG tablet Take 500 mg by mouth 2 (two) times daily. 02/24/20  Yes [provider]  famotidine (PEPCID) 20 MG tablet Take 1 tablet (20 mg total) by mouth 2 (two) times daily. 01/26/20  Yes Mesner, Barbara Cower, MD  ibuprofen (ADVIL) 800 MG tablet Take 800 mg by mouth 3 (three) times daily. 11/18/19  Yes [provider]  metFORMIN (GLUCOPHAGE) 500 MG tablet Take 500 mg by mouth daily. 11/18/19  Yes [provider]  montelukast (SINGULAIR) 10 MG tablet Take 10 mg by mouth daily as needed. 11/18/19  Yes [provider]  ondansetron (ZOFRAN ODT) 4 MG disintegrating tablet  ODT q4 hours prn nausea/vomit 01/26/20  Yes Mesner, Barbara Cower, MD  QUEtiapine (SEROQUEL) 50 MG tablet Take 50 mg by mouth at bedtime. 01/31/20  Yes [provider]  QVAR 80 MCG/ACT inhaler Inhale 2 puffs into the lungs daily. 09/19/15  Yes [provider]  sucralfate (CARAFATE) 1 GM/10ML suspension Take 10 mLs (1 g total) by mouth 4 (four) times daily -  with meals and at bedtime. 01/26/20  Yes Mesner, Barbara Cower, MD  Vitamin D, Ergocalciferol, (DRISDOL) 1.25 MG (50000 UNIT) CAPS capsule Take 50,000 Units by mouth every 7 (seven) days. fridays   Yes [provider]  cephALEXin (KEFLEX) 500 MG capsule Take 1 capsule (500 mg total) by mouth 4 (four) times daily. Patient not taking: Reported on 03/01/2020 01/26/20   Mesner, Barbara Cower, MD    Current Facility-Administered Medications  Medication Dose Route Frequency Provider Last Rate Last Admin  . 0.9 %  sodium chloride infusion   Intravenous Continuous Vanetta Mulders, MD 100 mL/hr at 03/01/20 1301 New Bag at 03/01/20 1301  . insulin aspart (novoLOG) injection 0-15 Units  0-15 Units Subcutaneous TID WC Elgergawy, Leana Roe, MD      . insulin aspart (novoLOG) injection 0-5 Units  0-5 Units Subcutaneous QHS Elgergawy, Dawood S, MD       . oxyCODONE (Oxy IR/ROXICODONE) immediate release tablet 5 mg  5 mg Oral Q4H PRN Elgergawy, Leana Roe, MD   5 mg at 03/01/20 1534   Current Outpatient Medications  Medication Sig Dispense Refill  . albuterol (PROVENTIL HFA;VENTOLIN HFA) 108 (90 BASE) MCG/ACT inhaler Inhale 2 puffs into the lungs every 6 (six) hours as needed for wheezing.    Marland Kitchen amLODipine (NORVASC) 5 MG tablet Take 5 mg by mouth daily.    . benzonatate (TESSALON) 200 MG capsule Take 1 capsule by mouth 3 (three) times daily as needed.    . ciprofloxacin (CIPRO) 500 MG tablet Take 500 mg by mouth 2 (two) times daily.    . famotidine (PEPCID) 20 MG tablet Take 1 tablet (20 mg  total) by mouth 2 (two) times daily. 10 tablet 0  . ibuprofen (ADVIL) 800 MG tablet Take 800 mg by mouth 3 (three) times daily.    . metFORMIN (GLUCOPHAGE) 500 MG tablet Take 500 mg by mouth daily.    . montelukast (SINGULAIR) 10 MG tablet Take 10 mg by mouth daily as needed.    . ondansetron (ZOFRAN ODT) 4 MG disintegrating tablet 4mg  ODT q4 hours prn nausea/vomit 4 tablet 0  . QUEtiapine (SEROQUEL) 50 MG tablet Take 50 mg by mouth at bedtime.    Marland Kitchen. QVAR 80 MCG/ACT inhaler Inhale 2 puffs into the lungs daily.  5  . sucralfate (CARAFATE) 1 GM/10ML suspension Take 10 mLs (1 g total) by mouth 4 (four) times daily -  with meals and at bedtime. 420 mL 0  . Vitamin D, Ergocalciferol, (DRISDOL) 1.25 MG (50000 UNIT) CAPS capsule Take 50,000 Units by mouth every 7 (seven) days. fridays    . cephALEXin (KEFLEX) 500 MG capsule Take 1 capsule (500 mg total) by mouth 4 (four) times daily. (Patient not taking: Reported on 03/01/2020) 28 capsule 0    Allergies as of 03/01/2020  . (No Known Allergies)    Family History  Problem Relation Age of Onset  . COPD Mother   . Diabetes Father   . Hypertension Father   . Heart disease Sister   . Heart disease Maternal Aunt   . Birth defects Daughter   . Kidney disease Daughter   . Bladder Cancer Neg Hx   . Kidney cancer  Neg Hx     Social History   Socioeconomic History  . Marital status: Divorced    Spouse name: Not on file  . Number of children: Not on file  . Years of education: Not on file  . Highest education level: Not on file  Occupational History  . Not on file  Tobacco Use  . Smoking status: Current Every Day Smoker    Packs/day: 1.50    Years: 19.00    Pack years: 28.50    Types: Cigarettes  . Smokeless tobacco: Never Used  Vaping Use  . Vaping Use: Never used  Substance and Sexual Activity  . Alcohol use: No    Alcohol/week: 0.0 standard drinks  . Drug use: No  . Sexual activity: Not Currently    Birth control/protection: None  Other Topics Concern  . Not on file  Social History Narrative  . Not on file   Social Determinants of Health   Financial Resource Strain:   . Difficulty of Paying Living Expenses:   Food Insecurity:   . Worried About Programme researcher, broadcasting/film/videounning Out of Food in the Last Year:   . Baristaan Out of Food in the Last Year:   Transportation Needs:   . Freight forwarderLack of Transportation (Medical):   Marland Kitchen. Lack of Transportation (Non-Medical):   Physical Activity:   . Days of Exercise per Week:   . Minutes of Exercise per Session:   Stress:   . Feeling of Stress :   Social Connections:   . Frequency of Communication with Friends and Family:   . Frequency of Social Gatherings with Friends and Family:   . Attends Religious Services:   . Active Member of Clubs or Organizations:   . Attends BankerClub or Organization Meetings:   Marland Kitchen. Marital Status:   Intimate Partner Violence:   . Fear of Current or Ex-Partner:   . Emotionally Abused:   Marland Kitchen. Physically Abused:   . Sexually Abused:  Review of Systems: See HPI, otherwise normal ROS  Physical Exam: Temp:  [98.3 F (36.8 C)] 98.3 F (36.8 C) (08/18 1022) Pulse Rate:  [70-86] 70 (08/18 1332) Resp:  [12-23] 16 (08/18 1332) BP: (146-156)/(88-102) 146/95 (08/18 1332) SpO2:  [97 %-100 %] 97 % (08/18 1332) Weight:  [86.2 kg] 86.2 kg (08/18 1023)    Patient was seen in emergency room. Patient is alert and in no acute distress. Conjunctiva is pink and sclerae nonicteric. Oropharyngeal mucosa is normal. Neck without masses thyromegaly or lymphadenopathy. Cardiac exam with regular rhythm normal S1 and S2.  No murmur gallop noted. Auscultation lungs reveal vesicular breath sounds bilaterally. Abdomen is protuberant with striae across lower abdomen as well as lower midline scar.  Bowel sounds are normal.  On palpation abdomen is soft.  She has mild tenderness in midepigastric region as well as below the right costal margin.  No masses or organomegaly. No peripheral edema or clubbing noted. She does have a tattoo over her upper back and both upper extremities.  Lab Results: Recent Labs    03/01/20 1110  WBC 7.7  HGB 12.6  HCT 39.7  PLT 292   BMET Recent Labs    03/01/20 1110  NA 141  K 3.0*  CL 106  CO2 26  GLUCOSE 97  BUN 11  CREATININE 0.75  CALCIUM 8.7*   LFT Recent Labs    03/01/20 1110  PROT 6.7  ALBUMIN 3.7  AST 91*  ALT 43  ALKPHOS 121  BILITOT 1.4*  BILIDIR 0.9*  IBILI 0.5   Lab data from 01/26/2020 Bilirubin 1.3, AP 104, AST 118 and ALT 49.  Transaminases.  From 3 years ago and 5 months ago were within normal limits.    Studies/Results: DG Chest 2 View  Result Date: 03/01/2020 CLINICAL DATA:  Chest pain. EXAM: CHEST - 2 VIEW COMPARISON:  Prior chest radiographs 05/12/2019 and earlier FINDINGS: Heart size within normal limits. No appreciable airspace consolidation or pulmonary edema. No evidence of pleural effusion or pneumothorax. No acute bony abnormality identified. IMPRESSION: No evidence of active cardiopulmonary disease. Electronically Signed   By: Jackey Loge DO   On: 03/01/2020 10:52   US Abdomen Complete  Result Date: 03/01/2020 CLINICAL DATA:  44 year old female with generalized abdominal pain for 1 day. EXAM: ABDOMEN ULTRASOUND COMPLETE COMPARISON:  CT Abdomen and Pelvis 01/26/2020, and  earlier. FINDINGS: Gallbladder: Multiple small echogenic foci within the lumen compatible with gallstones, the largest about 8 mm (image 12). Some of the smaller foci might also be tiny gallbladder polyps (inconsequential). No gallbladder wall thickening. No pericholecystic fluid. Equivocal sonographic Murphy sign. Common bile duct: Diameter: 7 mm, mildly dilated. No filling defect identified within the visible duct. Liver: Liver echogenicity within normal limits. Mildly coarse hepatic echotexture. No discrete liver lesion. No intrahepatic biliary ductal dilatation. Portal vein is patent on color Doppler imaging with normal direction of blood flow towards the liver. IVC: No abnormality visualized. Pancreas: Visualized portion unremarkable. Spleen: Size and appearance within normal limits. Right Kidney: Length: 9.8 cm. No hydronephrosis. Stable mildly lobulated liver contour. Nephrolithiasis is again evident in the lower pole (image 90). Left Kidney: Length: 9.3 cm. Echogenicity within normal limits. No mass or hydronephrosis visualized. Abdominal aorta: Incompletely visualized due to overlying bowel gas, visualized portions within normal limits. Other findings: None. IMPRESSION: 1. Positive for multiple small gallstones, and a mildly dilated CBD - suspicious for Choledocholithiasis. 2. No evidence of acute cholecystitis. 3. Chronic nephrolithiasis. Electronically Signed   By:  Odessa Fleming M.D.   On: 03/01/2020 13:23   CT from 01/26/2020 and ultrasound reviewed with Dr. Ulyses Southward.  Findings as above.  Assessment;  Patient is 44 year old Caucasian female who presents with postprandial episode of chest and right upper quadrant abdominal pain which started after supper yesterday.  She apparently has had similar episodes in the past but not as intense.  On evaluation she was noted to have mildly elevated transaminases and ultrasound reveals cholelithiasis and bile duct at upper limit of normal.  No stones are evident in  bile duct.  Patient was seen in emergency room for epigastric pain on 01/26/2020 and her transaminases were elevated. Patient presentation is suspicious for choledocholithiasis.  Since no stones or evident in borderline bile duct we will proceed with MRCP before considering ERCP. If MRCP is negative would definitely screen her for chronic viral hepatitis.  Recommendations;  Ceftriaxone 1 g IV q 24 hours MRCP. Further recommendations to follow.    LOS: 0 days   Malyah Ohlrich  03/01/2020, 4:06 PM

## 2020-03-02 DIAGNOSIS — Z7984 Long term (current) use of oral hypoglycemic drugs: Secondary | ICD-10-CM | POA: Diagnosis not present

## 2020-03-02 DIAGNOSIS — Z8249 Family history of ischemic heart disease and other diseases of the circulatory system: Secondary | ICD-10-CM | POA: Diagnosis not present

## 2020-03-02 DIAGNOSIS — Z825 Family history of asthma and other chronic lower respiratory diseases: Secondary | ICD-10-CM | POA: Diagnosis not present

## 2020-03-02 DIAGNOSIS — K8020 Calculus of gallbladder without cholecystitis without obstruction: Secondary | ICD-10-CM

## 2020-03-02 DIAGNOSIS — E6609 Other obesity due to excess calories: Secondary | ICD-10-CM | POA: Diagnosis present

## 2020-03-02 DIAGNOSIS — K8021 Calculus of gallbladder without cholecystitis with obstruction: Secondary | ICD-10-CM | POA: Diagnosis not present

## 2020-03-02 DIAGNOSIS — Z6834 Body mass index (BMI) 34.0-34.9, adult: Secondary | ICD-10-CM | POA: Diagnosis not present

## 2020-03-02 DIAGNOSIS — J449 Chronic obstructive pulmonary disease, unspecified: Secondary | ICD-10-CM | POA: Diagnosis present

## 2020-03-02 DIAGNOSIS — Z823 Family history of stroke: Secondary | ICD-10-CM | POA: Diagnosis not present

## 2020-03-02 DIAGNOSIS — F17210 Nicotine dependence, cigarettes, uncomplicated: Secondary | ICD-10-CM | POA: Diagnosis present

## 2020-03-02 DIAGNOSIS — Z20822 Contact with and (suspected) exposure to covid-19: Secondary | ICD-10-CM | POA: Diagnosis present

## 2020-03-02 DIAGNOSIS — E876 Hypokalemia: Secondary | ICD-10-CM | POA: Diagnosis present

## 2020-03-02 DIAGNOSIS — Z419 Encounter for procedure for purposes other than remedying health state, unspecified: Secondary | ICD-10-CM | POA: Diagnosis not present

## 2020-03-02 DIAGNOSIS — K219 Gastro-esophageal reflux disease without esophagitis: Secondary | ICD-10-CM | POA: Diagnosis present

## 2020-03-02 DIAGNOSIS — I10 Essential (primary) hypertension: Secondary | ICD-10-CM | POA: Diagnosis present

## 2020-03-02 DIAGNOSIS — K8050 Calculus of bile duct without cholangitis or cholecystitis without obstruction: Secondary | ICD-10-CM | POA: Diagnosis present

## 2020-03-02 DIAGNOSIS — E119 Type 2 diabetes mellitus without complications: Secondary | ICD-10-CM | POA: Diagnosis present

## 2020-03-02 DIAGNOSIS — Z833 Family history of diabetes mellitus: Secondary | ICD-10-CM | POA: Diagnosis not present

## 2020-03-02 DIAGNOSIS — K8000 Calculus of gallbladder with acute cholecystitis without obstruction: Secondary | ICD-10-CM | POA: Diagnosis present

## 2020-03-02 DIAGNOSIS — Z79899 Other long term (current) drug therapy: Secondary | ICD-10-CM | POA: Diagnosis not present

## 2020-03-02 HISTORY — DX: Calculus of gallbladder without cholecystitis without obstruction: K80.20

## 2020-03-02 LAB — CBC
HCT: 34.9 % — ABNORMAL LOW (ref 36.0–46.0)
Hemoglobin: 10.8 g/dL — ABNORMAL LOW (ref 12.0–15.0)
MCH: 30.3 pg (ref 26.0–34.0)
MCHC: 30.9 g/dL (ref 30.0–36.0)
MCV: 98 fL (ref 80.0–100.0)
Platelets: 242 10*3/uL (ref 150–400)
RBC: 3.56 MIL/uL — ABNORMAL LOW (ref 3.87–5.11)
RDW: 14.9 % (ref 11.5–15.5)
WBC: 3.8 10*3/uL — ABNORMAL LOW (ref 4.0–10.5)
nRBC: 0 % (ref 0.0–0.2)

## 2020-03-02 LAB — COMPREHENSIVE METABOLIC PANEL
ALT: 42 U/L (ref 0–44)
AST: 46 U/L — ABNORMAL HIGH (ref 15–41)
Albumin: 3.2 g/dL — ABNORMAL LOW (ref 3.5–5.0)
Alkaline Phosphatase: 106 U/L (ref 38–126)
Anion gap: 10 (ref 5–15)
BUN: 9 mg/dL (ref 6–20)
CO2: 26 mmol/L (ref 22–32)
Calcium: 8.5 mg/dL — ABNORMAL LOW (ref 8.9–10.3)
Chloride: 106 mmol/L (ref 98–111)
Creatinine, Ser: 0.67 mg/dL (ref 0.44–1.00)
GFR calc Af Amer: >60 mL/min (ref >60)
GFR calc non Af Amer: >60 mL/min (ref >60)
Glucose, Bld: 75 mg/dL (ref 70–99)
Potassium: 3.3 mmol/L — ABNORMAL LOW (ref 3.5–5.1)
Sodium: 142 mmol/L (ref 135–145)
Total Bilirubin: 0.8 mg/dL (ref 0.3–1.2)
Total Protein: 5.8 g/dL — ABNORMAL LOW (ref 6.5–8.1)

## 2020-03-02 LAB — GLUCOSE, CAPILLARY
Glucose-Capillary: 72 mg/dL (ref 70–99)
Glucose-Capillary: 68 mg/dL — ABNORMAL LOW (ref 70–99)
Glucose-Capillary: 77 mg/dL (ref 70–99)
Glucose-Capillary: 78 mg/dL (ref 70–99)

## 2020-03-02 LAB — HIV ANTIBODY (ROUTINE TESTING W REFLEX): HIV Screen 4th Generation wRfx: NONREACTIVE

## 2020-03-02 MED ORDER — KCL IN DEXTROSE-NACL 20-5-0.45 MEQ/L-%-% IV SOLN: INTRAVENOUS | Status: DC

## 2020-03-02 MED ORDER — BUDESONIDE 0.25 MG/2ML IN SUSP: 0.2500 mg | Freq: Two times a day (BID) | RESPIRATORY_TRACT | Status: DC | PRN

## 2020-03-02 MED ORDER — POTASSIUM CHLORIDE CRYS ER 20 MEQ PO TBCR
40.0000 meq | EXTENDED_RELEASE_TABLET | ORAL | Status: AC
  Administered 2020-03-02 (×2): 40 meq via ORAL
  Filled 2020-03-02: qty 4
  Filled 2020-03-02: qty 2

## 2020-03-02 NOTE — H&P (View-Only) (Signed)
Reason for Consult: Biliary colic, cholelithiasis Referring Physician: Dr. Darcey Nora is an 44 y.o. female.  HPI: Patient is a 44 year old white female who presented to the emergency room with worsening epigastric and right upper quadrant abdominal pain and nausea.  She states she has had this intermittently for many months.  It is aggravated by fatty foods.  No fever, chills, jaundice have been noted.  Work-up in the emergency room revealed cholelithiasis with a mildly dilated common bile duct.  She was noted to have mildly elevated LFTs.  An MRCP was performed which was negative for choledocholithiasis.  She states her abdominal pain is slightly better, but still requires IV pain medication.  Her nausea is improved.  She does have a decreased appetite.  Past Medical History:  Diagnosis Date  . AKI (acute kidney injury) (HCC) 06/04/2016  . Anxiety   . COPD (chronic obstructive pulmonary disease) (HCC)   . Depression   . History of kidney stones   . Hypertension   . IBS (irritable bowel syndrome)     Past Surgical History:  Procedure Laterality Date  . ABDOMINAL HYSTERECTOMY N/A 11/09/2015   Procedure: HYSTERECTOMY ABDOMINAL;  Surgeon: Tilda Burrow, MD;  Location: [facility];  Service: Gynecology;  Laterality: N/A;  . DILATION AND CURETTAGE OF UTERUS    . SALPINGOOPHORECTOMY Bilateral 11/09/2015   Procedure: SALPINGO OOPHORECTOMY;  Surgeon: Tilda Burrow, MD;  Location: [facility];  Service: Gynecology;  Laterality: Bilateral;    Family History  Problem Relation Age of Onset  . COPD Mother   . Diabetes Father   . Hypertension Father   . Heart disease Sister   . Heart disease Maternal Aunt   . Birth defects Daughter   . Kidney disease Daughter   . Bladder Cancer Neg Hx   . Kidney cancer Neg Hx     Social History:  reports that she has been smoking cigarettes. She has a 28.50 pack-year smoking history. She has never used smokeless tobacco. She reports that she does not  drink alcohol and does not use drugs.  Allergies: No Known Allergies  Medications: I have reviewed the patient's current medications.  Results for orders placed or performed during the hospital encounter of 03/01/20 (from the past 48 hour(s))  Basic metabolic panel     Status: Abnormal   Collection Time: 03/01/20 11:10 AM  Result Value Ref Range   Sodium 141 135 - 145 mmol/L   Potassium 3.0 (L) 3.5 - 5.1 mmol/L   Chloride 106 98 - 111 mmol/L   CO2 26 22 - 32 mmol/L   Glucose, Bld 97 70 - 99 mg/dL    Comment: Glucose reference range applies only to samples taken after fasting for at least 8 hours.   BUN 11 6 - 20 mg/dL   Creatinine, Ser 8.93 0.44 - 1.00 mg/dL   Calcium 8.7 (L) 8.9 - 10.3 mg/dL   GFR calc non Af Amer >60 >60 mL/min   GFR calc Af Amer >60 >60 mL/min   Anion gap 9 5 - 15    Comment: Performed at [facility], [address] [place], [place] [address]  CBC     Status: None   Collection Time: 03/01/20 11:10 AM  Result Value Ref Range   WBC 7.7 4.0 - 10.5 K/uL   RBC 4.07 3.87 - 5.11 MIL/uL   Hemoglobin 12.6 12.0 - 15.0 g/dL   HCT 51.0 36 - 46 %   MCV 97.5 80.0 - 100.0  fL   MCH 31.0 26.0 - 34.0 pg   MCHC 31.7 30.0 - 36.0 g/dL   RDW 03.4 91.7 - 91.5 %   Platelets 292 150 - 400 K/uL   nRBC 0.0 0.0 - 0.2 %    Comment: Performed at [facility], [address] [place], [place] [address]  Troponin I (High Sensitivity)     Status: None   Collection Time: 03/01/20 11:10 AM  Result Value Ref Range   Troponin I (High Sensitivity) 4 <18 ng/L    Comment: (NOTE) Elevated high sensitivity troponin I (hsTnI) values and significant  changes across serial measurements may suggest ACS but many other  chronic and acute conditions are known to elevate hsTnI results.  Refer to the "Links" section for chest pain algorithms and additional  guidance. Performed at [facility], [address], [place], [place] [address]   Hepatic function panel     Status: Abnormal   Collection  Time: 03/01/20 11:10 AM  Result Value Ref Range   Total Protein 6.7 6.5 - 8.1 g/dL   Albumin 3.7 3.5 - 5.0 g/dL   AST 91 (H) 15 - 41 U/L   ALT 43 0 - 44 U/L   Alkaline Phosphatase 121 38 - 126 U/L   Total Bilirubin 1.4 (H) 0.3 - 1.2 mg/dL   Bilirubin, Direct 0.9 (H) 0.0 - 0.2 mg/dL   Indirect Bilirubin 0.5 0.3 - 0.9 mg/dL    Comment: Performed at [facility], [address] [place], [place] [address]  Lipase, blood     Status: None   Collection Time: 03/01/20 11:10 AM  Result Value Ref Range   Lipase 27 11 - 51 U/L    Comment: Performed at [facility], [address] [place], [place] [address]  Hemoglobin A1c     Status: None   Collection Time: 03/01/20 11:10 AM  Result Value Ref Range   Hgb A1c MFr Bld 5.4 4.8 - 5.6 %    Comment: (NOTE) Pre diabetes:          5.7%-6.4%  Diabetes:              >6.4%  Glycemic control for   <7.0% adults with diabetes    Mean Plasma Glucose 108.28 mg/dL    Comment: Performed at [facility], [address] [place], [place] [address]  Troponin I (High Sensitivity)     Status: None   Collection Time: 03/01/20 12:56 PM  Result Value Ref Range   Troponin I (High Sensitivity) 4 <18 ng/L    Comment: (NOTE) Elevated high sensitivity troponin I (hsTnI) values and significant  changes across serial measurements may suggest ACS but many other  chronic and acute conditions are known to elevate hsTnI results.  Refer to the "Links" section for chest pain algorithms and additional  guidance. Performed at [facility], [address] [place], [place] [address]   SARS Coronavirus 2 by RT PCR (hospital order, performed in [facility] hospital lab) Nasopharyngeal Nasopharyngeal Swab     Status: None   Collection Time: 03/01/20  3:19 PM   Specimen: Nasopharyngeal Swab  Result Value Ref Range   SARS Coronavirus 2 NEGATIVE NEGATIVE    Comment: (NOTE) SARS-CoV-2 target nucleic acids are NOT DETECTED.  The SARS-CoV-2 RNA is generally detectable in  upper and lower respiratory specimens during the acute phase of infection. The lowest concentration of SARS-CoV-2 viral copies this assay can detect is 250 copies / mL. A negative result does not  preclude SARS-CoV-2 infection and should not be used as the sole basis for treatment or other patient management decisions.  A negative result may occur with improper specimen collection / handling, submission of specimen other than nasopharyngeal swab, presence of viral mutation(s) within the areas targeted by this assay, and inadequate number of viral copies (<250 copies / mL). A negative result must be combined with clinical observations, patient history, and epidemiological information.  Fact Sheet for Patients:   BoilerBrush.com.cy  Fact Sheet for Healthcare Providers: https://pope.com/  This test is not yet approved or  cleared by the [place] FDA and has been authorized for detection and/or diagnosis of SARS-CoV-2 by FDA under an Emergency Use Authorization (EUA).  This EUA will remain in effect (meaning this test can be used) for the duration of the COVID-19 declaration under Section 564(b)(1) of the Act, 21 U.S.C. section 360bbb-3(b)(1), unless the authorization is terminated or revoked sooner.  Performed at [facility], [address] [place], [place] [address]   CBG monitoring, ED     Status: Abnormal   Collection Time: 03/01/20  5:09 PM  Result Value Ref Range   Glucose-Capillary 105 (H) 70 - 99 mg/dL    Comment: Glucose reference range applies only to samples taken after fasting for at least 8 hours.  Glucose, capillary     Status: None   Collection Time: 03/01/20  9:14 PM  Result Value Ref Range   Glucose-Capillary 85 70 - 99 mg/dL    Comment: Glucose reference range applies only to samples taken after fasting for at least 8 hours.  Comprehensive metabolic panel     Status: Abnormal   Collection Time: 03/02/20  5:01 AM   Result Value Ref Range   Sodium 142 135 - 145 mmol/L   Potassium 3.3 (L) 3.5 - 5.1 mmol/L   Chloride 106 98 - 111 mmol/L   CO2 26 22 - 32 mmol/L   Glucose, Bld 75 70 - 99 mg/dL    Comment: Glucose reference range applies only to samples taken after fasting for at least 8 hours.   BUN 9 6 - 20 mg/dL   Creatinine, Ser 7.12 0.44 - 1.00 mg/dL   Calcium 8.5 (L) 8.9 - 10.3 mg/dL   Total Protein 5.8 (L) 6.5 - 8.1 g/dL   Albumin 3.2 (L) 3.5 - 5.0 g/dL   AST 46 (H) 15 - 41 U/L   ALT 42 0 - 44 U/L   Alkaline Phosphatase 106 38 - 126 U/L   Total Bilirubin 0.8 0.3 - 1.2 mg/dL   GFR calc non Af Amer >60 >60 mL/min   GFR calc Af Amer >60 >60 mL/min   Anion gap 10 5 - 15    Comment: Performed at [facility], [address] [place], [place] [address]  CBC     Status: Abnormal   Collection Time: 03/02/20  5:01 AM  Result Value Ref Range   WBC 3.8 (L) 4.0 - 10.5 K/uL   RBC 3.56 (L) 3.87 - 5.11 MIL/uL   Hemoglobin 10.8 (L) 12.0 - 15.0 g/dL   HCT 98.3 (L) 36 - 46 %   MCV 98.0 80.0 - 100.0 fL   MCH 30.3 26.0 - 34.0 pg   MCHC 30.9 30.0 - 36.0 g/dL   RDW 38.2 50.5 - 39.7 %   Platelets 242 150 - 400 K/uL   nRBC 0.0 0.0 - 0.2 %    Comment: Performed at [facility], [address], [place], [place] [address]  Glucose, capillary     Status: None   Collection Time: 03/02/20  7:42 AM  Result Value Ref Range   Glucose-Capillary 72 70 - 99 mg/dL    Comment: Glucose reference range applies only to samples taken after fasting for at least 8 hours.    DG Chest 2 View  Result Date: 03/01/2020 CLINICAL DATA:  Chest pain. EXAM: CHEST - 2 VIEW COMPARISON:  Prior chest radiographs 05/12/2019 and earlier FINDINGS: Heart size within normal limits. No appreciable airspace consolidation or pulmonary edema. No evidence of pleural effusion or pneumothorax. No acute bony abnormality identified. IMPRESSION: No evidence of active cardiopulmonary disease. Electronically Signed   By: Jackey Loge DO   On: 03/01/2020  10:52   US Abdomen Complete  Result Date: 03/01/2020 CLINICAL DATA:  44 year old female with generalized abdominal pain for 1 day. EXAM: ABDOMEN ULTRASOUND COMPLETE COMPARISON:  CT Abdomen and Pelvis 01/26/2020, and earlier. FINDINGS: Gallbladder: Multiple small echogenic foci within the lumen compatible with gallstones, the largest about 8 mm (image 12). Some of the smaller foci might also be tiny gallbladder polyps (inconsequential). No gallbladder wall thickening. No pericholecystic fluid. Equivocal sonographic Murphy sign. Common bile duct: Diameter: 7 mm, mildly dilated. No filling defect identified within the visible duct. Liver: Liver echogenicity within normal limits. Mildly coarse hepatic echotexture. No discrete liver lesion. No intrahepatic biliary ductal dilatation. Portal vein is patent on color Doppler imaging with normal direction of blood flow towards the liver. IVC: No abnormality visualized. Pancreas: Visualized portion unremarkable. Spleen: Size and appearance within normal limits. Right Kidney: Length: 9.8 cm. No hydronephrosis. Stable mildly lobulated liver contour. Nephrolithiasis is again evident in the lower pole (image 90). Left Kidney: Length: 9.3 cm. Echogenicity within normal limits. No mass or hydronephrosis visualized. Abdominal aorta: Incompletely visualized due to overlying bowel gas, visualized portions within normal limits. Other findings: None. IMPRESSION: 1. Positive for multiple small gallstones, and a mildly dilated CBD - suspicious for Choledocholithiasis. 2. No evidence of acute cholecystitis. 3. Chronic nephrolithiasis. Electronically Signed   By: Odessa Fleming M.D.   On: 03/01/2020 13:23   MR 3D Recon At Scanner  Result Date: 03/01/2020 CLINICAL DATA:  Right upper quadrant pain. Cholelithiasis and biliary ductal dilatation. EXAM: MRI ABDOMEN WITHOUT AND WITH CONTRAST (INCLUDING MRCP) TECHNIQUE: Multiplanar multisequence MR imaging of the abdomen was performed both before  and after the administration of intravenous contrast. Heavily T2-weighted images of the biliary and pancreatic ducts were obtained, and three-dimensional MRCP images were rendered by post processing. CONTRAST:  7mL GADAVIST GADOBUTROL 1 MMOL/ML IV SOLN COMPARISON:  Ultrasound on 03/01/2020 and CT on 01/26/2020 FINDINGS: Lower chest: No acute findings. Hepatobiliary: No hepatic masses identified. Gallbladder is distended and several tiny gallstones are seen. Mild gallbladder wall thickening is seen adjacent to the liver, however there is no evidence of pericholecystic inflammatory changes. Mild diffuse biliary ductal dilatation is seen to the level of the ampulla, with common bile duct measuring 11 mm in diameter. No evidence of choledocholithiasis or biliary stricture. Pancreas:  No mass or inflammatory changes. Spleen:  Within normal limits in size and appearance. Adrenals/Urinary Tract: No masses identified. Mild right renal parenchymal scarring noted. No evidence of hydronephrosis. Stomach/Bowel: Visualized portion unremarkable. Vascular/Lymphatic: No pathologically enlarged lymph nodes identified. No abdominal aortic aneurysm. Other:  None. Musculoskeletal:  No suspicious bone lesions identified. IMPRESSION: Cholelithiasis and mild gallbladder wall thickening. Acute cholecystitis cannot be excluded. Consider nuclear medicine HIDA scan if clinically warranted. Mild diffuse biliary ductal dilatation to the  level of the ampulla. No radiographic evidence of choledocholithiasis or other obstructing etiology. Electronically Signed   By: Danae OrleansJohn A Stahl M.D.   On: 03/01/2020 19:16   MR ABDOMEN MRCP W WO CONTAST  Result Date: 03/01/2020 CLINICAL DATA:  Right upper quadrant pain. Cholelithiasis and biliary ductal dilatation. EXAM: MRI ABDOMEN WITHOUT AND WITH CONTRAST (INCLUDING MRCP) TECHNIQUE: Multiplanar multisequence MR imaging of the abdomen was performed both before and after the administration of intravenous  contrast. Heavily T2-weighted images of the biliary and pancreatic ducts were obtained, and three-dimensional MRCP images were rendered by post processing. CONTRAST:  7mL GADAVIST GADOBUTROL 1 MMOL/ML IV SOLN COMPARISON:  Ultrasound on 03/01/2020 and CT on 01/26/2020 FINDINGS: Lower chest: No acute findings. Hepatobiliary: No hepatic masses identified. Gallbladder is distended and several tiny gallstones are seen. Mild gallbladder wall thickening is seen adjacent to the liver, however there is no evidence of pericholecystic inflammatory changes. Mild diffuse biliary ductal dilatation is seen to the level of the ampulla, with common bile duct measuring 11 mm in diameter. No evidence of choledocholithiasis or biliary stricture. Pancreas:  No mass or inflammatory changes. Spleen:  Within normal limits in size and appearance. Adrenals/Urinary Tract: No masses identified. Mild right renal parenchymal scarring noted. No evidence of hydronephrosis. Stomach/Bowel: Visualized portion unremarkable. Vascular/Lymphatic: No pathologically enlarged lymph nodes identified. No abdominal aortic aneurysm. Other:  None. Musculoskeletal:  No suspicious bone lesions identified. IMPRESSION: Cholelithiasis and mild gallbladder wall thickening. Acute cholecystitis cannot be excluded. Consider nuclear medicine HIDA scan if clinically warranted. Mild diffuse biliary ductal dilatation to the level of the ampulla. No radiographic evidence of choledocholithiasis or other obstructing etiology. Electronically Signed   By: Danae OrleansJohn A Stahl M.D.   On: 03/01/2020 19:16    ROS:  Pertinent items are noted in HPI.  Blood pressure 139/90, pulse 96, temperature 97.9 F (36.6 C), resp. rate 18, height 5\' 2"  (1.575 m), weight 86.2 kg, last menstrual period 09/24/2015, SpO2 95 %. Physical Exam: Pleasant white female no acute distress Head is normocephalic, atraumatic Eyes without scleral icterus Lungs clear to auscultation with good breath sounds  bilaterally Heart examination reveals a regular rate and rhythm without S3, S4, murmurs Abdomen is soft without significant tenderness or rigidity.  Bowel sounds are present.  Imaging results personally reviewed  Assessment/Plan: Impression: Biliary colic secondary to cholelithiasis, dilated common bile duct but negative for choledocholithiasis Plan: We will proceed with laparoscopic cholecystectomy with possible cholangiograms tomorrow.  The risks and benefits of the procedure including bleeding, infection, hepatobiliary injury, the possibility of an open procedure were fully explained to the patient, who gave informed consent.  Franky MachoMark Danniell Rotundo 03/02/2020, 8:16 AM

## 2020-03-02 NOTE — Anesthesia Preprocedure Evaluation (Addendum)
Anesthesia Evaluation  Patient identified by MRN, date of birth, ID band Patient awake    Reviewed: Allergy & Precautions, NPO status , Patient's Chart, lab work & pertinent test results  History of Anesthesia Complications Negative for: history of anesthetic complications  Airway Mallampati: II  TM Distance: >3 FB Neck ROM: Full    Dental  (+) Missing, Dental Advisory Given   Pulmonary COPD, Current Smoker and Patient abstained from smoking.,    Pulmonary exam normal breath sounds clear to auscultation       Cardiovascular Exercise Tolerance: Good hypertension, Pt. on medications Normal cardiovascular exam Rhythm:Regular Rate:Normal     Neuro/Psych PSYCHIATRIC DISORDERS Anxiety Depression    GI/Hepatic negative GI ROS, Acute cholecystitis    Endo/Other  diabetes, Well Controlled, Type 2, Oral Hypoglycemic Agents  Renal/GU Renal disease (AKI)     Musculoskeletal  (+) Arthritis  (CHRONIC BACK PAIN),   Abdominal   Peds  Hematology   Anesthesia Other Findings   Reproductive/Obstetrics                            Anesthesia Physical Anesthesia Plan  ASA: II  Anesthesia Plan: General   Post-op Pain Management:    Induction: Intravenous  PONV Risk Score and Plan: 4 or greater and Ondansetron, Dexamethasone, Scopolamine patch - Pre-op and Midazolam  Airway Management Planned: Oral ETT  Additional Equipment:   Intra-op Plan:   Post-operative Plan: Extubation in OR  Informed Consent: I have reviewed the patients History and Physical, chart, labs and discussed the procedure including the risks, benefits and alternatives for the proposed anesthesia with the patient or authorized representative who has indicated his/her understanding and acceptance.     Dental advisory given  Plan Discussed with: CRNA  Anesthesia Plan Comments:         Anesthesia Quick Evaluation

## 2020-03-02 NOTE — Consult Note (Signed)
Reason for Consult: Biliary colic, cholelithiasis Referring Physician: Dr. [name] is an 44 y.o. female.  HPI: Patient is a 44 year old white female who presented to the emergency room with worsening epigastric and right upper quadrant abdominal pain and nausea.  She states she has had this intermittently for many months.  It is aggravated by fatty foods.  No fever, chills, jaundice have been noted.  Work-up in the emergency room revealed cholelithiasis with a mildly dilated common bile duct.  She was noted to have mildly elevated LFTs.  An MRCP was performed which was negative for choledocholithiasis.  She states her abdominal pain is slightly better, but still requires IV pain medication.  Her nausea is improved.  She does have a decreased appetite.  Past Medical History:  Diagnosis Date  . AKI (acute kidney injury) (HCC) 06/04/2016  . Anxiety   . COPD (chronic obstructive pulmonary disease) (HCC)   . Depression   . History of kidney stones   . Hypertension   . IBS (irritable bowel syndrome)     Past Surgical History:  Procedure Laterality Date  . ABDOMINAL HYSTERECTOMY N/A 11/09/2015   Procedure: HYSTERECTOMY ABDOMINAL;  Surgeon: Norma Burrow, MD;  Location: [facility];  Service: Gynecology;  Laterality: N/A;  . DILATION AND CURETTAGE OF UTERUS    . SALPINGOOPHORECTOMY Bilateral 11/09/2015   Procedure: SALPINGO OOPHORECTOMY;  Surgeon: Norma Burrow, MD;  Location: [facility];  Service: Gynecology;  Laterality: Bilateral;    Family History  Problem Relation Age of Onset  . COPD Mother   . Diabetes Father   . Hypertension Father   . Heart disease Sister   . Heart disease Maternal Aunt   . Birth defects Daughter   . Kidney disease Daughter   . Bladder Cancer Neg Hx   . Kidney cancer Neg Hx     Social History:  reports that she has been smoking cigarettes. She has a 28.50 pack-year smoking history. She has never used smokeless tobacco. She reports that she does not  drink alcohol and does not use drugs.  Allergies: No Known Allergies  Medications: I have reviewed the patient's current medications.  Results for orders placed or performed during the hospital encounter of 03/01/20 (from the past 48 hour(s))  Basic metabolic panel     Status: Abnormal   Collection Time: 03/01/20 11:10 AM  Result Value Ref Range   Sodium 141 135 - 145 mmol/L   Potassium 3.0 (L) 3.5 - 5.1 mmol/L   Chloride 106 98 - 111 mmol/L   CO2 26 22 - 32 mmol/L   Glucose, Bld 97 70 - 99 mg/dL    Comment: Glucose reference range applies only to samples taken after fasting for at least 8 hours.   BUN 11 6 - 20 mg/dL   Creatinine, Ser 8.93 0.44 - 1.00 mg/dL   Calcium 8.7 (L) 8.9 - 10.3 mg/dL   GFR calc non Af Amer >60 >60 mL/min   GFR calc Af Amer >60 >60 mL/min   Anion gap 9 5 - 15    Comment: Performed at [facility], [address] [place], [place] [address]  CBC     Status: None   Collection Time: 03/01/20 11:10 AM  Result Value Ref Range   WBC 7.7 4.0 - 10.5 K/uL   RBC 4.07 3.87 - 5.11 MIL/uL   Hemoglobin 12.6 12.0 - 15.0 g/dL   HCT 51.0 36 - 46 %   MCV 97.5 80.0 - 100.0  fL   MCH 31.0 26.0 - 34.0 pg   MCHC 31.7 30.0 - 36.0 g/dL   RDW 03.4 91.7 - 91.5 %   Platelets 292 150 - 400 K/uL   nRBC 0.0 0.0 - 0.2 %    Comment: Performed at [facility], [address] [place], [place] [address]  Troponin I (High Sensitivity)     Status: None   Collection Time: 03/01/20 11:10 AM  Result Value Ref Range   Troponin I (High Sensitivity) 4 <18 ng/L    Comment: (NOTE) Elevated high sensitivity troponin I (hsTnI) values and significant  changes across serial measurements may suggest ACS but many other  chronic and acute conditions are known to elevate hsTnI results.  Refer to the "Links" section for chest pain algorithms and additional  guidance. Performed at [facility], [address], [place], [place] [address]   Hepatic function panel     Status: Abnormal   Collection  Time: 03/01/20 11:10 AM  Result Value Ref Range   Total Protein 6.7 6.5 - 8.1 g/dL   Albumin 3.7 3.5 - 5.0 g/dL   AST 91 (Norma) 15 - 41 U/L   ALT 43 0 - 44 U/L   Alkaline Phosphatase 121 38 - 126 U/L   Total Bilirubin 1.4 (Norma) 0.3 - 1.2 mg/dL   Bilirubin, Direct 0.9 (Norma) 0.0 - 0.2 mg/dL   Indirect Bilirubin 0.5 0.3 - 0.9 mg/dL    Comment: Performed at [facility], [address] [place], [place] [address]  Lipase, blood     Status: None   Collection Time: 03/01/20 11:10 AM  Result Value Ref Range   Lipase 27 11 - 51 U/L    Comment: Performed at [facility], [address] [place], [place] [address]  Hemoglobin A1c     Status: None   Collection Time: 03/01/20 11:10 AM  Result Value Ref Range   Hgb A1c MFr Bld 5.4 4.8 - 5.6 %    Comment: (NOTE) Pre diabetes:          5.7%-6.4%  Diabetes:              >6.4%  Glycemic control for   <7.0% adults with diabetes    Mean Plasma Glucose 108.28 mg/dL    Comment: Performed at [facility], [address] [place], [place] [address]  Troponin I (High Sensitivity)     Status: None   Collection Time: 03/01/20 12:56 PM  Result Value Ref Range   Troponin I (High Sensitivity) 4 <18 ng/L    Comment: (NOTE) Elevated high sensitivity troponin I (hsTnI) values and significant  changes across serial measurements may suggest ACS but many other  chronic and acute conditions are known to elevate hsTnI results.  Refer to the "Links" section for chest pain algorithms and additional  guidance. Performed at [facility], [address] [place], [place] [address]   SARS Coronavirus 2 by RT PCR (hospital order, performed in [facility] hospital lab) Nasopharyngeal Nasopharyngeal Swab     Status: None   Collection Time: 03/01/20  3:19 PM   Specimen: Nasopharyngeal Swab  Result Value Ref Range   SARS Coronavirus 2 NEGATIVE NEGATIVE    Comment: (NOTE) SARS-CoV-2 target nucleic acids are NOT DETECTED.  The SARS-CoV-2 RNA is generally detectable in  upper and lower respiratory specimens during the acute phase of infection. The lowest concentration of SARS-CoV-2 viral copies this assay can detect is 250 copies / mL. A negative result does not  preclude SARS-CoV-2 infection and should not be used as the sole basis for treatment or other patient management decisions.  A negative result may occur with improper specimen collection / handling, submission of specimen other than nasopharyngeal swab, presence of viral mutation(s) within the areas targeted by this assay, and inadequate number of viral copies (<250 copies / mL). A negative result must be combined with clinical observations, patient history, and epidemiological information.  Fact Sheet for Patients:   BoilerBrush.com.cy  Fact Sheet for Healthcare Providers: https://pope.com/  This test is not yet approved or  cleared by the [place] FDA and has been authorized for detection and/or diagnosis of SARS-CoV-2 by FDA under an Emergency Use Authorization (EUA).  This EUA will remain in effect (meaning this test can be used) for the duration of the COVID-19 declaration under Section 564(b)(1) of the Act, 21 U.S.C. section 360bbb-3(b)(1), unless the authorization is terminated or revoked sooner.  Performed at [facility], [address] [place], [place] [address]   CBG monitoring, ED     Status: Abnormal   Collection Time: 03/01/20  5:09 PM  Result Value Ref Range   Glucose-Capillary 105 (Norma) 70 - 99 mg/dL    Comment: Glucose reference range applies only to samples taken after fasting for at least 8 hours.  Glucose, capillary     Status: None   Collection Time: 03/01/20  9:14 PM  Result Value Ref Range   Glucose-Capillary 85 70 - 99 mg/dL    Comment: Glucose reference range applies only to samples taken after fasting for at least 8 hours.  Comprehensive metabolic panel     Status: Abnormal   Collection Time: 03/02/20  5:01 AM   Result Value Ref Range   Sodium 142 135 - 145 mmol/L   Potassium 3.3 (L) 3.5 - 5.1 mmol/L   Chloride 106 98 - 111 mmol/L   CO2 26 22 - 32 mmol/L   Glucose, Bld 75 70 - 99 mg/dL    Comment: Glucose reference range applies only to samples taken after fasting for at least 8 hours.   BUN 9 6 - 20 mg/dL   Creatinine, Ser 7.12 0.44 - 1.00 mg/dL   Calcium 8.5 (L) 8.9 - 10.3 mg/dL   Total Protein 5.8 (L) 6.5 - 8.1 g/dL   Albumin 3.2 (L) 3.5 - 5.0 g/dL   AST 46 (Norma) 15 - 41 U/L   ALT 42 0 - 44 U/L   Alkaline Phosphatase 106 38 - 126 U/L   Total Bilirubin 0.8 0.3 - 1.2 mg/dL   GFR calc non Af Amer >60 >60 mL/min   GFR calc Af Amer >60 >60 mL/min   Anion gap 10 5 - 15    Comment: Performed at [facility], [address] [place], [place] [address]  CBC     Status: Abnormal   Collection Time: 03/02/20  5:01 AM  Result Value Ref Range   WBC 3.8 (L) 4.0 - 10.5 K/uL   RBC 3.56 (L) 3.87 - 5.11 MIL/uL   Hemoglobin 10.8 (L) 12.0 - 15.0 g/dL   HCT 98.3 (L) 36 - 46 %   MCV 98.0 80.0 - 100.0 fL   MCH 30.3 26.0 - 34.0 pg   MCHC 30.9 30.0 - 36.0 g/dL   RDW 38.2 50.5 - 39.7 %   Platelets 242 150 - 400 K/uL   nRBC 0.0 0.0 - 0.2 %    Comment: Performed at [facility], [address], [place], [place] [address]  Glucose, capillary     Status: None   Collection Time: 03/02/20  7:42 AM  Result Value Ref Range   Glucose-Capillary 72 70 - 99 mg/dL    Comment: Glucose reference range applies only to samples taken after fasting for at least 8 hours.    DG Chest 2 View  Result Date: 03/01/2020 CLINICAL DATA:  Chest pain. EXAM: CHEST - 2 VIEW COMPARISON:  Prior chest radiographs 05/12/2019 and earlier FINDINGS: Heart size within normal limits. No appreciable airspace consolidation or pulmonary edema. No evidence of pleural effusion or pneumothorax. No acute bony abnormality identified. IMPRESSION: No evidence of active cardiopulmonary disease. Electronically Signed   By: Norma  Golden DO   On: 03/01/2020  10:52   US Abdomen Complete  Result Date: 03/01/2020 CLINICAL DATA:  44-year-old female with generalized abdominal pain for 1 day. EXAM: ABDOMEN ULTRASOUND COMPLETE COMPARISON:  CT Abdomen and Pelvis 01/26/2020, and earlier. FINDINGS: Gallbladder: Multiple small echogenic foci within the lumen compatible with gallstones, the largest about 8 mm (image 12). Some of the smaller foci might also be tiny gallbladder polyps (inconsequential). No gallbladder wall thickening. No pericholecystic fluid. Equivocal sonographic Murphy sign. Common bile duct: Diameter: 7 mm, mildly dilated. No filling defect identified within the visible duct. Liver: Liver echogenicity within normal limits. Mildly coarse hepatic echotexture. No discrete liver lesion. No intrahepatic biliary ductal dilatation. Portal vein is patent on color Doppler imaging with normal direction of blood flow towards the liver. IVC: No abnormality visualized. Pancreas: Visualized portion unremarkable. Spleen: Size and appearance within normal limits. Right Kidney: Length: 9.8 cm. No hydronephrosis. Stable mildly lobulated liver contour. Nephrolithiasis is again evident in the lower pole (image 90). Left Kidney: Length: 9.3 cm. Echogenicity within normal limits. No mass or hydronephrosis visualized. Abdominal aorta: Incompletely visualized due to overlying bowel gas, visualized portions within normal limits. Other findings: None. IMPRESSION: 1. Positive for multiple small gallstones, and a mildly dilated CBD - suspicious for Choledocholithiasis. 2. No evidence of acute cholecystitis. 3. Chronic nephrolithiasis. Electronically Signed   By: Norma [name] M.D.   On: 03/01/2020 13:23   MR 3D Recon At Scanner  Result Date: 03/01/2020 CLINICAL DATA:  Right upper quadrant pain. Cholelithiasis and biliary ductal dilatation. EXAM: MRI ABDOMEN WITHOUT AND WITH CONTRAST (INCLUDING MRCP) TECHNIQUE: Multiplanar multisequence MR imaging of the abdomen was performed both before  and after the administration of intravenous contrast. Heavily T2-weighted images of the biliary and pancreatic ducts were obtained, and three-dimensional MRCP images were rendered by post processing. CONTRAST:  7mL GADAVIST GADOBUTROL 1 MMOL/ML IV SOLN COMPARISON:  Ultrasound on 03/01/2020 and CT on 01/26/2020 FINDINGS: Lower chest: No acute findings. Hepatobiliary: No hepatic masses identified. Gallbladder is distended and several tiny gallstones are seen. Mild gallbladder wall thickening is seen adjacent to the liver, however there is no evidence of pericholecystic inflammatory changes. Mild diffuse biliary ductal dilatation is seen to the level of the ampulla, with common bile duct measuring 11 mm in diameter. No evidence of choledocholithiasis or biliary stricture. Pancreas:  No mass or inflammatory changes. Spleen:  Within normal limits in size and appearance. Adrenals/Urinary Tract: No masses identified. Mild right renal parenchymal scarring noted. No evidence of hydronephrosis. Stomach/Bowel: Visualized portion unremarkable. Vascular/Lymphatic: No pathologically enlarged lymph nodes identified. No abdominal aortic aneurysm. Other:  None. Musculoskeletal:  No suspicious bone lesions identified. IMPRESSION: Cholelithiasis and mild gallbladder wall thickening. Acute cholecystitis cannot be excluded. Consider nuclear medicine HIDA scan if clinically warranted. Mild diffuse biliary ductal dilatation to the   level of the ampulla. No radiographic evidence of choledocholithiasis or other obstructing etiology. Electronically Signed   By: Norma [name] M.D.   On: 03/01/2020 19:16   MR ABDOMEN MRCP W WO CONTAST  Result Date: 03/01/2020 CLINICAL DATA:  Right upper quadrant pain. Cholelithiasis and biliary ductal dilatation. EXAM: MRI ABDOMEN WITHOUT AND WITH CONTRAST (INCLUDING MRCP) TECHNIQUE: Multiplanar multisequence MR imaging of the abdomen was performed both before and after the administration of intravenous  contrast. Heavily T2-weighted images of the biliary and pancreatic ducts were obtained, and three-dimensional MRCP images were rendered by post processing. CONTRAST:  7mL GADAVIST GADOBUTROL 1 MMOL/ML IV SOLN COMPARISON:  Ultrasound on 03/01/2020 and CT on 01/26/2020 FINDINGS: Lower chest: No acute findings. Hepatobiliary: No hepatic masses identified. Gallbladder is distended and several tiny gallstones are seen. Mild gallbladder wall thickening is seen adjacent to the liver, however there is no evidence of pericholecystic inflammatory changes. Mild diffuse biliary ductal dilatation is seen to the level of the ampulla, with common bile duct measuring 11 mm in diameter. No evidence of choledocholithiasis or biliary stricture. Pancreas:  No mass or inflammatory changes. Spleen:  Within normal limits in size and appearance. Adrenals/Urinary Tract: No masses identified. Mild right renal parenchymal scarring noted. No evidence of hydronephrosis. Stomach/Bowel: Visualized portion unremarkable. Vascular/Lymphatic: No pathologically enlarged lymph nodes identified. No abdominal aortic aneurysm. Other:  None. Musculoskeletal:  No suspicious bone lesions identified. IMPRESSION: Cholelithiasis and mild gallbladder wall thickening. Acute cholecystitis cannot be excluded. Consider nuclear medicine HIDA scan if clinically warranted. Mild diffuse biliary ductal dilatation to the level of the ampulla. No radiographic evidence of choledocholithiasis or other obstructing etiology. Electronically Signed   By: Norma [name] M.D.   On: 03/01/2020 19:16    ROS:  Pertinent items are noted in HPI.  Blood pressure 139/90, pulse 96, temperature 97.9 F (36.6 C), resp. rate 18, height 5\' 2"  (1.575 m), weight 86.2 kg, last menstrual period 09/24/2015, SpO2 95 %. Physical Exam: Pleasant white female no acute distress Head is normocephalic, atraumatic Eyes without scleral icterus Lungs clear to auscultation with good breath sounds  bilaterally Heart examination reveals a regular rate and rhythm without S3, S4, murmurs Abdomen is soft without significant tenderness or rigidity.  Bowel sounds are present.  Imaging results personally reviewed  Assessment/Plan: Impression: Biliary colic secondary to cholelithiasis, dilated common bile duct but negative for choledocholithiasis Plan: We will proceed with laparoscopic cholecystectomy with possible cholangiograms tomorrow.  The risks and benefits of the procedure including bleeding, infection, hepatobiliary injury, the possibility of an open procedure were fully explained to the patient, who gave informed consent.  Franky MachoMark Matthan Sledge 03/02/2020, 8:16 AM

## 2020-03-02 NOTE — Progress Notes (Signed)
PROGRESS NOTE    Norma [name]  UQJ:[id number] DOB: May 02, 1976 DOA: 03/01/2020 PCP: Evelene Croon, MD    Chief Complaint  Patient presents with  . Chest Pain    Brief Narrative:  As per H&P written by Dr. Randol Kern on 03/01/2020; briefly: 44 year old white female who presented to the emergency room with worsening epigastric and right upper quadrant abdominal pain and nausea.  She states she has had this intermittently for many months.  It is aggravated by fatty foods.  No fever, chills or jaundice have been noted.  With biliary colic in the setting of cholelithiasis.  MRCP has rule out choledocholithiasis.   Assessment & Plan: 1-biliary colic in the setting of cholelithiasis -Continue IV fluids, antiemetics and pain management -Supportive care and laparoscopic cholecystectomy with intraoperative cholangiogram most likely 03/03/2020 -Appreciate assistance and recommendation by GI service and general surgery.  2-essential hypertension -Stable and well-controlled -Continue current antihypertensive regimen -Follow vital signs.  3-type 2 diabetes mellitus -Continue sliding scale insulin -Will follow CBGs. -Continue holding oral hypoglycemic agents while inpatient.  4-class I obesity -Low calorie diet, portion control increase physical activity discussed with patient -Body mass index is 34.75 kg/m.  5-tobacco abuse/COPD -No wheezing and good air movement bilaterally -Continue as needed bronchodilator regiment -Cessation counseling has been provided.  6-GERD -Continue famotidine and Carafate  DVT prophylaxis: Heparin Code Status: Full code.   Family Communication: No family at bedside. Disposition:   Status is: Admitted under observation; but after discussing with organization review department, the continued need of IV pain medication, IV fluids for supportive care and anticipated surgical intervention patient meets inpatient criteria.    Dispo: The patient is  from: Home              Anticipated d/c is to: Home              Anticipated d/c date is: 1-2 days              Patient currently no medically ready for discharge; still experiencing intermittent right upper quadrant discomfort and expressing having some nausea (even improved).  Decreased appetite and the need for laparoscopic cholecystectomy with intraoperative cholangiogram most likely on 03/03/2020.       Consultants:   General surgery  GI service   Procedures:  See below for x-ray reports   Antimicrobials:  Rocephin  Subjective: Currently afebrile, no chest pain, no vomiting.  Reports intermittent right upper quadrant discomfort and some nausea (last one much improved).  Decreased appetite.  Objective: Vitals:   03/01/20 1958 03/02/20 0040 03/02/20 0505 03/02/20 1014  BP: (!) 158/99 (!) 179/106 139/90   Pulse: 70 76 96   Resp: 16 19 18    Temp:  98.2 F (36.8 C) 97.9 F (36.6 C)   TempSrc:      SpO2: 100% 97% 95% 96%  Weight:      Height:        Intake/Output Summary (Last 24 hours) at 03/02/2020 1231 Last data filed at 03/02/2020 0300 Gross per 24 hour  Intake 646.61 ml  Output --  Net 646.61 ml   Filed Weights   03/01/20 1023  Weight: 86.2 kg    Examination:  General exam: Currently afebrile, reporting intermittent right upper quadrant discomfort and some nausea.  No chest pain, no shortness of breath. Respiratory system: Good air movement bilaterally; no using accessory muscles.  Good oxygen saturation on room air. Cardiovascular system: S1 & S2 heard, RRR. No JVD, murmurs, rubs, gallops or clicks.  No pedal edema. Gastrointestinal system: Abdomen is nondistended, soft and mildly tender to palpation in midepigastric and right upper quadrant area. No organomegaly or masses felt. Normal bowel sounds heard. Central nervous system: Alert and oriented. No focal neurological deficits. Extremities: No cyanosis or clubbing. Skin: No rashes, no  petechiae. Psychiatry: Judgement and insight appear normal. Mood & affect appropriate.     Data Reviewed: I have personally reviewed following labs and imaging studies  CBC: Recent Labs  Lab 03/01/20 1110 03/02/20 0501  WBC 7.7 3.8*  HGB 12.6 10.8*  HCT 39.7 34.9*  MCV 97.5 98.0  PLT 292 242    Basic Metabolic Panel: Recent Labs  Lab 03/01/20 1110 03/02/20 0501  NA 141 142  K 3.0* 3.3*  CL 106 106  CO2 26 26  GLUCOSE 97 75  BUN 11 9  CREATININE 0.75 0.67  CALCIUM 8.7* 8.5*    GFR: Estimated Creatinine Clearance: 92.3 mL/min (by C-G formula based on SCr of 0.67 mg/dL).  Liver Function Tests: Recent Labs  Lab 03/01/20 1110 03/02/20 0501  AST 91* 46*  ALT 43 42  ALKPHOS 121 106  BILITOT 1.4* 0.8  PROT 6.7 5.8*  ALBUMIN 3.7 3.2*    CBG: Recent Labs  Lab 03/01/20 1709 03/01/20 2114 03/02/20 0742 03/02/20 1106  GLUCAP 105* 85 72 68*     Recent Results (from the past 240 hour(s))  SARS Coronavirus 2 by RT PCR (hospital order, performed in [facility] hospital lab) Nasopharyngeal Nasopharyngeal Swab     Status: None   Collection Time: 03/01/20  3:19 PM   Specimen: Nasopharyngeal Swab  Result Value Ref Range Status   SARS Coronavirus 2 NEGATIVE NEGATIVE Final    Comment: (NOTE) SARS-CoV-2 target nucleic acids are NOT DETECTED.  The SARS-CoV-2 RNA is generally detectable in upper and lower respiratory specimens during the acute phase of infection. The lowest concentration of SARS-CoV-2 viral copies this assay can detect is 250 copies / mL. A negative result does not preclude SARS-CoV-2 infection and should not be used as the sole basis for treatment or other patient management decisions.  A negative result may occur with improper specimen collection / handling, submission of specimen other than nasopharyngeal swab, presence of viral mutation(s) within the areas targeted by this assay, and inadequate number of viral copies (<250 copies / mL). A  negative result must be combined with clinical observations, patient history, and epidemiological information.  Fact Sheet for Patients:   BoilerBrush.com.cy  Fact Sheet for Healthcare Providers: https://pope.com/  This test is not yet approved or  cleared by the [place] FDA and has been authorized for detection and/or diagnosis of SARS-CoV-2 by FDA under an Emergency Use Authorization (EUA).  This EUA will remain in effect (meaning this test can be used) for the duration of the COVID-19 declaration under Section 564(b)(1) of the Act, 21 U.S.C. section 360bbb-3(b)(1), unless the authorization is terminated or revoked sooner.  Performed at [facility], [address], [place], [place] [address]      Radiology Studies: DG Chest 2 View  Result Date: 03/01/2020 CLINICAL DATA:  Chest pain. EXAM: CHEST - 2 VIEW COMPARISON:  Prior chest radiographs 05/12/2019 and earlier FINDINGS: Heart size within normal limits. No appreciable airspace consolidation or pulmonary edema. No evidence of pleural effusion or pneumothorax. No acute bony abnormality identified. IMPRESSION: No evidence of active cardiopulmonary disease. Electronically Signed   By: Jackey Loge DO   On: 03/01/2020 10:52   US Abdomen Complete  Result  Date: 03/01/2020 CLINICAL DATA:  44 year old female with generalized abdominal pain for 1 day. EXAM: ABDOMEN ULTRASOUND COMPLETE COMPARISON:  CT Abdomen and Pelvis 01/26/2020, and earlier. FINDINGS: Gallbladder: Multiple small echogenic foci within the lumen compatible with gallstones, the largest about 8 mm (image 12). Some of the smaller foci might also be tiny gallbladder polyps (inconsequential). No gallbladder wall thickening. No pericholecystic fluid. Equivocal sonographic Murphy sign. Common bile duct: Diameter: 7 mm, mildly dilated. No filling defect identified within the visible duct. Liver: Liver echogenicity within normal  limits. Mildly coarse hepatic echotexture. No discrete liver lesion. No intrahepatic biliary ductal dilatation. Portal vein is patent on color Doppler imaging with normal direction of blood flow towards the liver. IVC: No abnormality visualized. Pancreas: Visualized portion unremarkable. Spleen: Size and appearance within normal limits. Right Kidney: Length: 9.8 cm. No hydronephrosis. Stable mildly lobulated liver contour. Nephrolithiasis is again evident in the lower pole (image 90). Left Kidney: Length: 9.3 cm. Echogenicity within normal limits. No mass or hydronephrosis visualized. Abdominal aorta: Incompletely visualized due to overlying bowel gas, visualized portions within normal limits. Other findings: None. IMPRESSION: 1. Positive for multiple small gallstones, and a mildly dilated CBD - suspicious for Choledocholithiasis. 2. No evidence of acute cholecystitis. 3. Chronic nephrolithiasis. Electronically Signed   By: Odessa FlemingH  Hall M.D.   On: 03/01/2020 13:23   MR 3D Recon At Scanner  Result Date: 03/01/2020 CLINICAL DATA:  Right upper quadrant pain. Cholelithiasis and biliary ductal dilatation. EXAM: MRI ABDOMEN WITHOUT AND WITH CONTRAST (INCLUDING MRCP) TECHNIQUE: Multiplanar multisequence MR imaging of the abdomen was performed both before and after the administration of intravenous contrast. Heavily T2-weighted images of the biliary and pancreatic ducts were obtained, and three-dimensional MRCP images were rendered by post processing. CONTRAST:  7mL GADAVIST GADOBUTROL 1 MMOL/ML IV SOLN COMPARISON:  Ultrasound on 03/01/2020 and CT on 01/26/2020 FINDINGS: Lower chest: No acute findings. Hepatobiliary: No hepatic masses identified. Gallbladder is distended and several tiny gallstones are seen. Mild gallbladder wall thickening is seen adjacent to the liver, however there is no evidence of pericholecystic inflammatory changes. Mild diffuse biliary ductal dilatation is seen to the level of the ampulla, with  common bile duct measuring 11 mm in diameter. No evidence of choledocholithiasis or biliary stricture. Pancreas:  No mass or inflammatory changes. Spleen:  Within normal limits in size and appearance. Adrenals/Urinary Tract: No masses identified. Mild right renal parenchymal scarring noted. No evidence of hydronephrosis. Stomach/Bowel: Visualized portion unremarkable. Vascular/Lymphatic: No pathologically enlarged lymph nodes identified. No abdominal aortic aneurysm. Other:  None. Musculoskeletal:  No suspicious bone lesions identified. IMPRESSION: Cholelithiasis and mild gallbladder wall thickening. Acute cholecystitis cannot be excluded. Consider nuclear medicine HIDA scan if clinically warranted. Mild diffuse biliary ductal dilatation to the level of the ampulla. No radiographic evidence of choledocholithiasis or other obstructing etiology. Electronically Signed   By: Danae OrleansJohn A Stahl M.D.   On: 03/01/2020 19:16   MR ABDOMEN MRCP W WO CONTAST  Result Date: 03/01/2020 CLINICAL DATA:  Right upper quadrant pain. Cholelithiasis and biliary ductal dilatation. EXAM: MRI ABDOMEN WITHOUT AND WITH CONTRAST (INCLUDING MRCP) TECHNIQUE: Multiplanar multisequence MR imaging of the abdomen was performed both before and after the administration of intravenous contrast. Heavily T2-weighted images of the biliary and pancreatic ducts were obtained, and three-dimensional MRCP images were rendered by post processing. CONTRAST:  7mL GADAVIST GADOBUTROL 1 MMOL/ML IV SOLN COMPARISON:  Ultrasound on 03/01/2020 and CT on 01/26/2020 FINDINGS: Lower chest: No acute findings. Hepatobiliary: No hepatic masses identified. Gallbladder is  distended and several tiny gallstones are seen. Mild gallbladder wall thickening is seen adjacent to the liver, however there is no evidence of pericholecystic inflammatory changes. Mild diffuse biliary ductal dilatation is seen to the level of the ampulla, with common bile duct measuring 11 mm in diameter. No  evidence of choledocholithiasis or biliary stricture. Pancreas:  No mass or inflammatory changes. Spleen:  Within normal limits in size and appearance. Adrenals/Urinary Tract: No masses identified. Mild right renal parenchymal scarring noted. No evidence of hydronephrosis. Stomach/Bowel: Visualized portion unremarkable. Vascular/Lymphatic: No pathologically enlarged lymph nodes identified. No abdominal aortic aneurysm. Other:  None. Musculoskeletal:  No suspicious bone lesions identified. IMPRESSION: Cholelithiasis and mild gallbladder wall thickening. Acute cholecystitis cannot be excluded. Consider nuclear medicine HIDA scan if clinically warranted. Mild diffuse biliary ductal dilatation to the level of the ampulla. No radiographic evidence of choledocholithiasis or other obstructing etiology. Electronically Signed   By: Danae Orleans M.D.   On: 03/01/2020 19:16    Scheduled Meds: . budesonide (PULMICORT) nebulizer solution  0.25 mg Nebulization BID  . famotidine  20 mg Oral BID  . heparin  5,000 Units Subcutaneous Q8H  . insulin aspart  0-15 Units Subcutaneous TID WC  . insulin aspart  0-5 Units Subcutaneous QHS  . montelukast  10 mg Oral QHS  . potassium chloride  40 mEq Oral Q4H  . QUEtiapine  50 mg Oral QHS  . sucralfate  1 g Oral TID WC & HS  . [START ON 03/03/2020] Vitamin D (Ergocalciferol)  50,000 Units Oral Q7 days   Continuous Infusions: . cefTRIAXone (ROCEPHIN)  IV Stopped (03/01/20 1923)  . dextrose 5 % and 0.45 % NaCl with KCl 20 mEq/L 75 mL/hr at 03/02/20 0834     LOS: 0 days    Time spent: 30 minutes.    Vassie Loll, MD [facility]   To contact the attending provider between 7A-7P or the covering provider during after hours 7P-7A, please log into the web site [email or web address] and access using universal [place] password for that web site. If you do not have the password, please call the hospital operator.  03/02/2020, 12:31 PM

## 2020-03-03 ENCOUNTER — Inpatient Hospital Stay (HOSPITAL_COMMUNITY): Payer: Medicaid Other | Admitting: Anesthesiology

## 2020-03-03 ENCOUNTER — Encounter (HOSPITAL_COMMUNITY)
Admission: EM | Disposition: A | Discharge status: Home / Self Care | Payer: Self-pay | Source: Home / Self Care | Attending: Internal Medicine

## 2020-03-03 ENCOUNTER — Inpatient Hospital Stay (HOSPITAL_COMMUNITY): Payer: Medicaid Other

## 2020-03-03 DIAGNOSIS — E6609 Other obesity due to excess calories: Secondary | ICD-10-CM

## 2020-03-03 DIAGNOSIS — Z419 Encounter for procedure for purposes other than remedying health state, unspecified: Secondary | ICD-10-CM

## 2020-03-03 DIAGNOSIS — Z6834 Body mass index (BMI) 34.0-34.9, adult: Secondary | ICD-10-CM

## 2020-03-03 DIAGNOSIS — J449 Chronic obstructive pulmonary disease, unspecified: Secondary | ICD-10-CM

## 2020-03-03 DIAGNOSIS — Z720 Tobacco use: Secondary | ICD-10-CM

## 2020-03-03 HISTORY — PX: CHOLECYSTECTOMY: SHX55

## 2020-03-03 LAB — MRSA PCR SCREENING: MRSA by PCR: NEGATIVE

## 2020-03-03 LAB — BASIC METABOLIC PANEL
Anion gap: 8 (ref 5–15)
BUN: <5 mg/dL — ABNORMAL LOW (ref 6–20)
CO2: 27 mmol/L (ref 22–32)
Calcium: 9 mg/dL (ref 8.9–10.3)
Chloride: 104 mmol/L (ref 98–111)
Creatinine, Ser: 0.59 mg/dL (ref 0.44–1.00)
GFR calc Af Amer: >60 mL/min (ref >60)
GFR calc non Af Amer: >60 mL/min (ref >60)
Glucose, Bld: 91 mg/dL (ref 70–99)
Potassium: 4.5 mmol/L (ref 3.5–5.1)
Sodium: 139 mmol/L (ref 135–145)

## 2020-03-03 LAB — GLUCOSE, CAPILLARY
Glucose-Capillary: 98 mg/dL (ref 70–99)
Glucose-Capillary: 86 mg/dL (ref 70–99)
Glucose-Capillary: 89 mg/dL (ref 70–99)

## 2020-03-03 SURGERY — LAPAROSCOPIC CHOLECYSTECTOMY WITH INTRAOPERATIVE CHOLANGIOGRAM
Anesthesia: General | Site: Abdomen

## 2020-03-03 MED ORDER — HYDROMORPHONE HCL 1 MG/ML IJ SOLN
INTRAMUSCULAR | Status: AC
  Filled 2020-03-03: qty 0.5

## 2020-03-03 MED ORDER — ONDANSETRON HCL 4 MG/2ML IJ SOLN
INTRAMUSCULAR | Status: DC | PRN
  Administered 2020-03-03: 4 mg via INTRAVENOUS

## 2020-03-03 MED ORDER — SUGAMMADEX SODIUM 200 MG/2ML IV SOLN
INTRAVENOUS | Status: DC | PRN
  Administered 2020-03-03: 200 mg via INTRAVENOUS

## 2020-03-03 MED ORDER — GLYCOPYRROLATE PF 0.2 MG/ML IJ SOSY
PREFILLED_SYRINGE | INTRAMUSCULAR | Status: AC
  Filled 2020-03-03: qty 1

## 2020-03-03 MED ORDER — LACTATED RINGERS IV SOLN: INTRAVENOUS | Status: DC | PRN

## 2020-03-03 MED ORDER — CHLORHEXIDINE GLUCONATE 0.12 % MT SOLN
15.0000 mL | Freq: Once | OROMUCOSAL | Status: AC
  Administered 2020-03-03: 15 mL via OROMUCOSAL
  Filled 2020-03-03: qty 15

## 2020-03-03 MED ORDER — LIDOCAINE 2% (20 MG/ML) 5 ML SYRINGE
INTRAMUSCULAR | Status: AC
  Filled 2020-03-03: qty 5

## 2020-03-03 MED ORDER — FENTANYL CITRATE (PF) 250 MCG/5ML IJ SOLN
INTRAMUSCULAR | Status: AC
  Filled 2020-03-03: qty 5

## 2020-03-03 MED ORDER — PROPOFOL 10 MG/ML IV BOLUS
INTRAVENOUS | Status: AC
  Filled 2020-03-03: qty 40

## 2020-03-03 MED ORDER — PROPOFOL 10 MG/ML IV BOLUS
INTRAVENOUS | Status: DC | PRN
  Administered 2020-03-03: 160 mg via INTRAVENOUS

## 2020-03-03 MED ORDER — ESMOLOL HCL 100 MG/10ML IV SOLN
INTRAVENOUS | Status: AC
  Filled 2020-03-03: qty 10

## 2020-03-03 MED ORDER — PHENYLEPHRINE 40 MCG/ML (10ML) SYRINGE FOR IV PUSH (FOR BLOOD PRESSURE SUPPORT)
PREFILLED_SYRINGE | INTRAVENOUS | Status: DC | PRN
  Administered 2020-03-03: 40 ug via INTRAVENOUS

## 2020-03-03 MED ORDER — PROMETHAZINE HCL 25 MG/ML IJ SOLN
INTRAMUSCULAR | Status: AC
  Filled 2020-03-03: qty 1

## 2020-03-03 MED ORDER — ACETAMINOPHEN 10 MG/ML IV SOLN
INTRAVENOUS | Status: AC
  Filled 2020-03-03: qty 100

## 2020-03-03 MED ORDER — MIDAZOLAM HCL 2 MG/2ML IJ SOLN
INTRAMUSCULAR | Status: DC | PRN
  Administered 2020-03-03: 1 mg via INTRAVENOUS

## 2020-03-03 MED ORDER — MIDAZOLAM HCL 2 MG/2ML IJ SOLN
2.0000 mg | Freq: Once | INTRAMUSCULAR | Status: AC
  Administered 2020-03-03: 2 mg via INTRAVENOUS

## 2020-03-03 MED ORDER — SUCCINYLCHOLINE CHLORIDE 200 MG/10ML IV SOSY
PREFILLED_SYRINGE | INTRAVENOUS | Status: AC
  Filled 2020-03-03: qty 10

## 2020-03-03 MED ORDER — 0.9 % SODIUM CHLORIDE (POUR BTL) OPTIME
TOPICAL | Status: DC | PRN
  Administered 2020-03-03: 1000 mL

## 2020-03-03 MED ORDER — LACTATED RINGERS IV SOLN: Freq: Once | INTRAVENOUS | Status: AC

## 2020-03-03 MED ORDER — HYDROMORPHONE HCL 1 MG/ML IJ SOLN: 1.0000 mg | INTRAMUSCULAR | Status: DC | PRN

## 2020-03-03 MED ORDER — LIDOCAINE HCL (CARDIAC) PF 100 MG/5ML IV SOSY
PREFILLED_SYRINGE | INTRAVENOUS | Status: DC | PRN
  Administered 2020-03-03: 100 mg via INTRAVENOUS

## 2020-03-03 MED ORDER — MIDAZOLAM HCL 2 MG/2ML IJ SOLN
INTRAMUSCULAR | Status: AC
  Filled 2020-03-03: qty 2

## 2020-03-03 MED ORDER — ROCURONIUM BROMIDE 10 MG/ML (PF) SYRINGE
PREFILLED_SYRINGE | INTRAVENOUS | Status: AC
  Filled 2020-03-03: qty 10

## 2020-03-03 MED ORDER — GLYCOPYRROLATE PF 0.2 MG/ML IJ SOSY
PREFILLED_SYRINGE | INTRAMUSCULAR | Status: AC
  Filled 2020-03-03: qty 3

## 2020-03-03 MED ORDER — ONDANSETRON HCL 4 MG/2ML IJ SOLN
INTRAMUSCULAR | Status: AC
  Filled 2020-03-03: qty 2

## 2020-03-03 MED ORDER — HYDROMORPHONE HCL 1 MG/ML IJ SOLN
0.2500 mg | INTRAMUSCULAR | Status: DC | PRN
  Administered 2020-03-03 (×2): 0.5 mg via INTRAVENOUS
  Filled 2020-03-03: qty 0.5

## 2020-03-03 MED ORDER — ACETAMINOPHEN 10 MG/ML IV SOLN
1000.0000 mg | Freq: Once | INTRAVENOUS | Status: AC
  Administered 2020-03-03: 1000 mg via INTRAVENOUS

## 2020-03-03 MED ORDER — IPRATROPIUM-ALBUTEROL 0.5-2.5 (3) MG/3ML IN SOLN: 3.0000 mL | Freq: Once | RESPIRATORY_TRACT | Status: DC | PRN

## 2020-03-03 MED ORDER — CHLORHEXIDINE GLUCONATE CLOTH 2 % EX PADS
6.0000 | MEDICATED_PAD | Freq: Once | CUTANEOUS | Status: AC
  Administered 2020-03-03: 6 via TOPICAL

## 2020-03-03 MED ORDER — EPHEDRINE 5 MG/ML INJ
INTRAVENOUS | Status: AC
  Filled 2020-03-03: qty 10

## 2020-03-03 MED ORDER — HYDROCODONE-ACETAMINOPHEN 10-325 MG PO TABS
1.0000 | ORAL_TABLET | Freq: Four times a day (QID) | ORAL | 0 refills | Status: DC | PRN
Start: 2020-03-03 — End: 2020-03-09

## 2020-03-03 MED ORDER — BUPIVACAINE LIPOSOME 1.3 % IJ SUSP
INTRAMUSCULAR | Status: AC
  Filled 2020-03-03: qty 20

## 2020-03-03 MED ORDER — ORAL CARE MOUTH RINSE: 15.0000 mL | Freq: Once | OROMUCOSAL | Status: AC

## 2020-03-03 MED ORDER — MEPERIDINE HCL 50 MG/ML IJ SOLN: 6.2500 mg | INTRAMUSCULAR | Status: DC | PRN

## 2020-03-03 MED ORDER — ALBUTEROL SULFATE HFA 108 (90 BASE) MCG/ACT IN AERS
INHALATION_SPRAY | RESPIRATORY_TRACT | Status: AC
  Filled 2020-03-03: qty 6.7

## 2020-03-03 MED ORDER — PROMETHAZINE HCL 25 MG/ML IJ SOLN
6.2500 mg | INTRAMUSCULAR | Status: DC | PRN
  Administered 2020-03-03: 6.25 mg via INTRAVENOUS

## 2020-03-03 MED ORDER — METOCLOPRAMIDE HCL 5 MG/ML IJ SOLN
INTRAMUSCULAR | Status: AC
  Filled 2020-03-03: qty 2

## 2020-03-03 MED ORDER — FENTANYL CITRATE (PF) 100 MCG/2ML IJ SOLN
INTRAMUSCULAR | Status: DC | PRN
Start: 2020-03-03 — End: 2020-03-03
  Administered 2020-03-03 (×2): 100 ug via INTRAVENOUS
  Administered 2020-03-03: 50 ug via INTRAVENOUS

## 2020-03-03 MED ORDER — SUCCINYLCHOLINE CHLORIDE 20 MG/ML IJ SOLN
INTRAMUSCULAR | Status: DC | PRN
  Administered 2020-03-03: 140 mg via INTRAVENOUS

## 2020-03-03 MED ORDER — ROCURONIUM BROMIDE 100 MG/10ML IV SOLN
INTRAVENOUS | Status: DC | PRN
  Administered 2020-03-03: 40 mg via INTRAVENOUS

## 2020-03-03 MED ORDER — PHENYLEPHRINE 40 MCG/ML (10ML) SYRINGE FOR IV PUSH (FOR BLOOD PRESSURE SUPPORT)
PREFILLED_SYRINGE | INTRAVENOUS | Status: AC
  Filled 2020-03-03: qty 10

## 2020-03-03 MED ORDER — AMOXICILLIN-POT CLAVULANATE 875-125 MG PO TABS: 1.0000 | ORAL_TABLET | Freq: Two times a day (BID) | ORAL | 0 refills | Status: AC

## 2020-03-03 MED ORDER — HYDROCODONE-ACETAMINOPHEN 10-325 MG PO TABS
1.0000 | ORAL_TABLET | ORAL | Status: DC | PRN
  Administered 2020-03-03: 1 via ORAL
  Filled 2020-03-03 (×2): qty 1

## 2020-03-03 MED ORDER — DEXAMETHASONE SODIUM PHOSPHATE 10 MG/ML IJ SOLN
INTRAMUSCULAR | Status: AC
  Filled 2020-03-03: qty 2

## 2020-03-03 MED ORDER — BUPIVACAINE LIPOSOME 1.3 % IJ SUSP
INTRAMUSCULAR | Status: DC | PRN
  Administered 2020-03-03: 20 mL

## 2020-03-03 MED ORDER — DEXAMETHASONE SODIUM PHOSPHATE 4 MG/ML IJ SOLN
INTRAMUSCULAR | Status: DC | PRN
  Administered 2020-03-03: 10 mg via INTRAVENOUS

## 2020-03-03 SURGICAL SUPPLY — 47 items
APPLICATOR ARISTA FLEXITIP XL (MISCELLANEOUS) IMPLANT
APPLIER CLIP ROT 10 11.4 M/L (STAPLE) ×3
BAG RETRIEVAL 10 (BASKET) ×1
BAG RETRIEVAL 10MM (BASKET) ×1
CATH CHOLANGIOGRAM 4.5FR (CATHETERS) ×3 IMPLANT
CLIP APPLIE ROT 10 11.4 M/L (STAPLE) ×1 IMPLANT
CLOTH BEACON ORANGE TIMEOUT ST (SAFETY) ×3 IMPLANT
COVER LIGHT HANDLE STERIS (MISCELLANEOUS) ×6 IMPLANT
COVER WAND RF STERILE (DRAPES) ×3 IMPLANT
DERMABOND ADVANCED (GAUZE/BANDAGES/DRESSINGS) ×2
DERMABOND ADVANCED .7 DNX12 (GAUZE/BANDAGES/DRESSINGS) ×1 IMPLANT
DRAPE C-ARM FOLDED MOBILE STRL (DRAPES) ×3 IMPLANT
DURAPREP 26ML APPLICATOR (WOUND CARE) ×3 IMPLANT
ELECT REM PT RETURN 9FT ADLT (ELECTROSURGICAL) ×3
ELECTRODE REM PT RTRN 9FT ADLT (ELECTROSURGICAL) ×1 IMPLANT
GLOVE BIOGEL PI IND STRL 7.0 (GLOVE) ×3 IMPLANT
GLOVE BIOGEL PI INDICATOR 7.0 (GLOVE) ×8
GLOVE SURG SS PI 7.0 STRL IVOR (GLOVE) ×2 IMPLANT
GLOVE SURG SS PI 7.5 STRL IVOR (GLOVE) ×3 IMPLANT
GOWN STRL REUS W/TWL LRG LVL3 (GOWN DISPOSABLE) ×9 IMPLANT
HEMOSTAT ARISTA ABSORB 3G PWDR (HEMOSTASIS) IMPLANT
HEMOSTAT SNOW SURGICEL 2X4 (HEMOSTASIS) ×3 IMPLANT
INST SET LAPROSCOPIC AP (KITS) ×3 IMPLANT
KIT TURNOVER KIT A (KITS) ×3 IMPLANT
MANIFOLD NEPTUNE II (INSTRUMENTS) ×3 IMPLANT
NDL HYPO 18GX1.5 BLUNT FILL (NEEDLE) ×1 IMPLANT
NEEDLE HYPO 18GX1.5 BLUNT FILL (NEEDLE) ×3 IMPLANT
NEEDLE HYPO 22GX1.5 SAFETY (NEEDLE) ×3 IMPLANT
NEEDLE INSUFFLATION 120MM (ENDOMECHANICALS) ×3 IMPLANT
NS IRRIG 1000ML POUR BTL (IV SOLUTION) ×3 IMPLANT
PACK LAP CHOLE LZT030E (CUSTOM PROCEDURE TRAY) ×3 IMPLANT
PAD ARMBOARD 7.5X6 YLW CONV (MISCELLANEOUS) ×3 IMPLANT
SET BASIN LINEN APH (SET/KITS/TRAYS/PACK) ×3 IMPLANT
SET TUBE IRRIG SUCTION NO TIP (IRRIGATION / IRRIGATOR) IMPLANT
SET TUBE SMOKE EVAC HIGH FLOW (TUBING) ×3 IMPLANT
SLEEVE ENDOPATH XCEL 5M (ENDOMECHANICALS) ×3 IMPLANT
SUT MNCRL AB 4-0 PS2 18 (SUTURE) ×6 IMPLANT
SUT VICRYL 0 UR6 27IN ABS (SUTURE) ×3 IMPLANT
SYR 20ML LL LF (SYRINGE) ×6 IMPLANT
SYR 30ML LL (SYRINGE) ×3 IMPLANT
SYS BAG RETRIEVAL 10MM (BASKET) ×1
SYSTEM BAG RETRIEVAL 10MM (BASKET) ×1 IMPLANT
TROCAR ENDO BLADELESS 11MM (ENDOMECHANICALS) ×3 IMPLANT
TROCAR XCEL NON-BLD 5MMX100MML (ENDOMECHANICALS) ×3 IMPLANT
TROCAR XCEL UNIV SLVE 11M 100M (ENDOMECHANICALS) ×3 IMPLANT
WARMER LAPAROSCOPE (MISCELLANEOUS) ×3 IMPLANT
YANKAUER SUCT 12FT TUBE ARGYLE (SUCTIONS) ×3 IMPLANT

## 2020-03-03 NOTE — Transfer of Care (Signed)
Immediate Anesthesia Transfer of Care Note  Patient: Norma [name]  Procedure(s) Performed: LAPAROSCOPIC CHOLECYSTECTOMY (N/A Abdomen)  Patient Location: PACU  Anesthesia Type:General  Level of Consciousness: awake, alert  and oriented  Airway & Oxygen Therapy: Patient Spontanous Breathing  Post-op Assessment: Report given to RN and Post -op Vital signs reviewed and stable  Post vital signs: Reviewed and stable  Last Vitals:  Vitals Value Taken Time  BP 137/91 03/03/20 1330  Temp 36.5 C 03/03/20 1321  Pulse 102 03/03/20 1334  Resp 13 03/03/20 1334  SpO2 95 % 03/03/20 1334  Vitals shown include unvalidated device data.  Last Pain:  Vitals:   03/03/20 1321  TempSrc:   PainSc: 8       Patients Stated Pain Goal: 4 (03/03/20 1007)  Complications: No complications documented.

## 2020-03-03 NOTE — Discharge Instructions (Signed)
Laparoscopic Cholecystectomy, Care After This sheet gives you information about how to care for yourself after your procedure. Your health care provider may also give you more specific instructions. If you have problems or questions, contact your health care provider. What can I expect after the procedure? After the procedure, it is common to have:  Pain at your incision sites. You will be given medicines to control this pain.  Mild nausea or vomiting.  Bloating and possible shoulder pain from the air-like gas that was used during the procedure. Follow these instructions at home: Incision care   Follow instructions from your health care provider about how to take care of your incisions. Make sure you: ? Wash your hands with soap and water before you change your bandage (dressing). If soap and water are not available, use hand sanitizer. ? Change your dressing as told by your health care provider. ? Leave stitches (sutures), skin glue, or adhesive strips in place. These skin closures may need to be in place for 2 weeks or longer. If adhesive strip edges start to loosen and curl up, you may trim the loose edges. Do not remove adhesive strips completely unless your health care provider tells you to do that.  Do not take baths, swim, or use a hot tub until your health care provider approves. Ask your health care provider if you can take showers. You may only be allowed to take sponge baths for bathing.  Check your incision area every day for signs of infection. Check for: ? More redness, swelling, or pain. ? More fluid or blood. ? Warmth. ? Pus or a bad smell. Activity  Do not drive or use heavy machinery while taking prescription pain medicine.  Do not lift anything that is heavier than 10 lb (4.5 kg) until your health care provider approves.  Do not play contact sports until your health care provider approves.  Do not drive for 24 hours if you were given a medicine to help you relax  (sedative).  Rest as needed. Do not return to work or school until your health care provider approves. General instructions  Take over-the-counter and prescription medicines only as told by your health care provider.  To prevent or treat constipation while you are taking prescription pain medicine, your health care provider may recommend that you: ? Drink enough fluid to keep your urine clear or pale yellow. ? Take over-the-counter or prescription medicines. ? Eat foods that are high in fiber, such as fresh fruits and vegetables, whole grains, and beans. ? Limit foods that are high in fat and processed sugars, such as fried and sweet foods. Contact a health care provider if:  You develop a rash.  You have more redness, swelling, or pain around your incisions.  You have more fluid or blood coming from your incisions.  Your incisions feel warm to the touch.  You have pus or a bad smell coming from your incisions.  You have a fever.  One or more of your incisions breaks open. Get help right away if:  You have trouble breathing.  You have chest pain.  You have increasing pain in your shoulders.  You faint or feel dizzy when you stand.  You have severe pain in your abdomen.  You have nausea or vomiting that lasts for more than one day.  You have leg pain. This information is not intended to replace advice given to you by your health care provider. Make sure you discuss any questions you have   with your health care provider. Document Revised: 06/13/2017 Document Reviewed: 12/18/2015 Elsevier Patient Education  2020 Elsevier Inc.  

## 2020-03-03 NOTE — Anesthesia Postprocedure Evaluation (Signed)
Anesthesia Post Note  Patient: DILLYN JOAQUIN  Procedure(s) Performed: LAPAROSCOPIC CHOLECYSTECTOMY (N/A Abdomen)  Patient location during evaluation: PACU Anesthesia Type: General Level of consciousness: awake and awake and alert Pain management: pain level controlled Vital Signs Assessment: post-procedure vital signs reviewed and stable Respiratory status: spontaneous breathing, nonlabored ventilation and respiratory function stable Cardiovascular status: stable Postop Assessment: no apparent nausea or vomiting Anesthetic complications: no   No complications documented.   Last Vitals:  Vitals:   03/03/20 1322 03/03/20 1330  BP:  (!) 137/91  Pulse:  (!) 103  Resp:  15  Temp:    SpO2: 95% 95%    Last Pain:  Vitals:   03/03/20 1321  TempSrc:   PainSc: 8                  Aretha Levi Hristova

## 2020-03-03 NOTE — Anesthesia Procedure Notes (Signed)
Procedure Name: Intubation Date/Time: 03/03/2020 12:23 PM Performed by: Hewitt Blade, CRNA Pre-anesthesia Checklist: Patient identified, Emergency Drugs available, Suction available and Patient being monitored Patient Re-evaluated:Patient Re-evaluated prior to induction Oxygen Delivery Method: Circle system utilized Preoxygenation: Pre-oxygenation with 100% oxygen Induction Type: IV induction Ventilation: Mask ventilation without difficulty Laryngoscope Size: Mac and 3 Grade View: Grade I Tube type: Oral Tube size: 7.0 mm Number of attempts: 1 Airway Equipment and Method: Stylet and Oral airway Placement Confirmation: ETT inserted through vocal cords under direct vision,  positive ETCO2 and breath sounds checked- equal and bilateral Secured at: 21 cm Tube secured with: Tape Dental Injury: Teeth and Oropharynx as per pre-operative assessment

## 2020-03-03 NOTE — Interval H&P Note (Signed)
History and Physical Interval Note:  03/03/2020 11:09 AM  Norma [name]  has presented today for surgery, with the diagnosis of cholelithiasis.  The various methods of treatment have been discussed with the patient and family. After consideration of risks, benefits and other options for treatment, the patient has consented to  Procedure(s): LAPAROSCOPIC CHOLECYSTECTOMY WITH INTRAOPERATIVE CHOLANGIOGRAM (N/A) as a surgical intervention.  The patient's history has been reviewed, patient examined, no change in status, stable for surgery.  I have reviewed the patient's chart and labs.  Questions were answered to the patient's satisfaction.     Franky Macho

## 2020-03-03 NOTE — Op Note (Signed)
Patient:  Norma [name]  DOB:  1976-04-27  MRN:  [id number]   Preop Diagnosis: Acute cholecystitis, cholelithiasis  Postop Diagnosis: Same  Procedure: Laparoscopic cholecystectomy  Surgeon: Franky Macho, MD  Anes: General endotracheal  Indications: Patient is a 44 year old white female who presented to the emergency room with worsening right upper quadrant abdominal pain, nausea, and vomiting.  She did have a preoperative MRCP as her common bile duct was noted to be dilated.  She also had abnormal LFTs.  Her MRCP was unremarkable.  No choledocholithiasis was seen.  The patient now comes to the operating room for laparoscopic cholecystectomy with possible intraoperative cholangiograms.  The risks and benefits of the procedure including bleeding, infection, hepatobiliary injury, the possibility of an open procedure were fully explained to the patient, who gave informed consent.  Procedure note: The patient was placed in the supine position.  After induction of general endotracheal anesthesia, the abdomen was prepped and draped using the usual sterile technique with ChloraPrep.  Surgical site confirmation was performed.  A supraumbilical incision was made down to the fascia.  A Veress needle was introduced into the abdominal cavity and confirmation of placement was done using the saline drop test.  The abdomen was then insufflated to 15 mmHg pressure.  An 11 mm trocar was introduced into the abdominal cavity under direct visualization without difficulty.  The patient was placed in reverse Trendelenburg position and an additional 11 mm trocar was placed in the epigastric region and 5 mm trochars were placed the right upper quadrant and right flank regions.  The liver was inspected and noted to be within normal limits.  The gallbladder was noted to be distended and had to be decompressed in order to facilitate exposure.  The gallbladder was retracted in a dynamic fashion in order to provide a  critical view of the triangle of [place].  A partial retrograde dome down approach was performed until the critical view was seen.  The cystic duct was noted to be diffusely dilated and there was difficult to insert a cholangiogram.  Thus, endoclips were placed proximally and distally on the cystic duct, the cystic duct was divided.  This was likewise done the cystic artery.  The gallbladder was then removed from the gallbladder fossa using Bovie electrocautery.  The gallbladder was delivered through the epigastric trocar site using an Endo Catch bag.  The gallbladder fossa was inspected and no abnormal bleeding or bile leakage was noted.  Surgicel was placed in the gallbladder fossa.  All fluid and air were then evacuated from the abdominal cavity prior to removal of the trochars.  All wounds were irrigated with normal saline.  All wounds were injected with Exparel.  All skin incisions were closed using a 4-0 Monocryl subcuticular suture.  Dermabond was applied.  All tape and needle counts were correct at the end of the procedure.  The patient was extubated in the operating room and transferred to PACU in stable condition.  Complications: None  EBL: Minimal  Specimen: Gallbladder

## 2020-03-03 NOTE — Discharge Summary (Addendum)
Physician Discharge Summary  Norma [name] WUJ:[id number] DOB: [birth year] DOA: 03/01/2020  PCP: Evelene Croon, MD  Admit date: 03/01/2020 Discharge date: 03/03/2020  Time spent: 35 minutes  Recommendations for Outpatient Follow-up:  1. Repeat complete metabolic panel to follow electrolytes, LFTs and renal function. 2. Reassess blood pressure and adjust antihypertensive treatment as needed 3. Continue assisting patient with tobacco cessation 4. Close monitoring of patient's CBGs with further adjustment to hypoglycemic regimen as required.   Discharge Diagnoses:  Active Problems:   Surgery, elective   HTN (hypertension)   Diabetes (HCC)   Choledocholithiasis   Cholelithiasis   Class 1 obesity due to excess calories without serious comorbidity with body mass index (BMI) of 34.0 to 34.9 in adult   Tobacco abuse   Chronic obstructive pulmonary disease (HCC)   Discharge Condition: Stable and improved.  Discharged home with instruction to follow-up with PCP in 2 weeks and in 1 week with general surgery.  CODE STATUS: Full code  Diet recommendation: Heart healthy modified carbohydrate diet.  Filed Weights   03/01/20 1023  Weight: 86.2 kg    History of present illness:  As per H&P written by Dr. Randol Kern on 03/01/2020; briefly: 44 year old white female who presented to the emergency room with worsening epigastric and right upper quadrant abdominal pain and nausea. She states she has had this intermittently for many months. It is aggravated by fatty foods. No fever, chills or jaundice have been noted.  With biliary colic in the setting of cholelithiasis.  MRCP has rule out choledocholithiasis.   Hospital Course:  1-biliary colic in the setting of cholelithiasis -Patient condition is stabilized with conservative management initially -Obstructions and gallstone retention rule out; status post laparoscopic cholecystectomy on 03/03/2020. -Patient will complete oral  ciprofloxacin as recommended by general surgery and follow-up in 1 week with then. -Continue as needed analgesics and antiemetics.    2-essential hypertension -Stable and well-controlled -Continue current antihypertensive regimen -Heart healthy diet has been encouraged.  3-type 2 diabetes mellitus -Continue home oral hypoglycemic agents -Patient advised to follow modified carbohydrate diet -Outpatient follow-up patient CBG with further adjustment to medications are required.  4-class I obesity -Low calorie diet, portion control increase physical activity discussed with patient -Body mass index is 34.75 kg/m.  5-tobacco abuse/COPD -No wheezing and good air movement bilaterally -Continue home scheduled and as needed bronchodilator regimen -Cessation counseling has been provided.  6-GERD -Continue famotidine and Carafate -Lifestyle changes discussed with patient.   Procedures:  Laparoscopic cholecystectomy 03/03/2020  Consultations:  General surgery  Discharge Exam: Vitals:   03/03/20 1330 03/03/20 1342  BP: (!) 137/91 (!) 144/97  Pulse: (!) 103 (!) 102  Resp: 15   Temp:    SpO2: 95% 95%    General: Afebrile, no chest pain, no nausea, no vomiting.  Reports improvement in her abdominal discomfort. Cardiovascular: S1 and S2, no rubs, no gallops, no JVD on exam. Respiratory: No wheezing, no crackles, no using accessory muscle.  Good O2 sat on room air Abdomen: Mildly tender to palpation around laparoscopic incisions; positive bowel sounds, no guarding. Extremities: No cyanosis or clubbing.  Discharge Instructions   Discharge Instructions    Diet - low sodium heart healthy   Complete by: As directed    Diet Carb Modified   Complete by: As directed    Discharge instructions   Complete by: As directed    Follow heart healthy and modify carbohydrate diet Maintain adequate hydration Follow-up with general surgery service as instructed Arrange follow-up with  PCP in 2 weeks.   Discharge wound care:   Complete by: As directed    Routine care as instructed by general surgery service.     Allergies as of 03/03/2020   No Known Allergies     Medication List    STOP taking these medications   cephALEXin 500 MG capsule Commonly known as: KEFLEX   ciprofloxacin 500 MG tablet Commonly known as: CIPRO     TAKE these medications   albuterol 108 (90 Base) MCG/ACT inhaler Commonly known as: VENTOLIN HFA Inhale 2 puffs into the lungs every 6 (six) hours as needed for wheezing.   amLODipine 5 MG tablet Commonly known as: NORVASC Take 5 mg by mouth daily.   amoxicillin-clavulanate 875-125 MG tablet Commonly known as: Augmentin Take 1 tablet by mouth 2 (two) times daily for 5 days.   benzonatate 200 MG capsule Commonly known as: TESSALON Take 1 capsule by mouth 3 (three) times daily as needed.   famotidine 20 MG tablet Commonly known as: Pepcid Take 1 tablet (20 mg total) by mouth 2 (two) times daily.   HYDROcodone-acetaminophen 10-325 MG tablet Commonly known as: Norco Take 1 tablet by mouth every 6 (six) hours as needed.   ibuprofen 800 MG tablet Commonly known as: ADVIL Take 800 mg by mouth 3 (three) times daily.   metFORMIN 500 MG tablet Commonly known as: GLUCOPHAGE Take 500 mg by mouth daily.   montelukast 10 MG tablet Commonly known as: SINGULAIR Take 10 mg by mouth daily as needed.   ondansetron 4 MG disintegrating tablet Commonly known as: Zofran ODT 4mg  ODT q4 hours prn nausea/vomit   QUEtiapine 50 MG tablet Commonly known as: SEROQUEL Take 50 mg by mouth at bedtime.   Qvar 80 MCG/ACT inhaler Generic drug: beclomethasone Inhale 2 puffs into the lungs daily.   sucralfate 1 GM/10ML suspension Commonly known as: Carafate Take 10 mLs (1 g total) by mouth 4 (four) times daily -  with meals and at bedtime.   Vitamin D (Ergocalciferol) 1.25 MG (50000 UNIT) Caps capsule Commonly known as: DRISDOL Take 50,000  Units by mouth every 7 (seven) days. fridays            Discharge Care Instructions  (From admission, onward)         Start     Ordered   03/03/20 0000  Discharge wound care:       Comments: Routine care as instructed by general surgery service.   03/03/20 1524         No Known Allergies  Follow-up Information    03/05/20, MD.   Specialty: General Surgery Why: As needed.  Will call you next week for follow up Contact information: [address] [facility] [phone]        [phone], MD. Schedule an appointment as soon as possible for a visit in 2 week(s).   Specialty: Family Medicine Contact information: [address] [place] [phone]                The results of significant diagnostics from this hospitalization (including imaging, microbiology, ancillary and laboratory) are listed below for reference.    Significant Diagnostic Studies: DG Chest 2 View  Result Date: 03/01/2020 CLINICAL DATA:  Chest pain. EXAM: CHEST - 2 VIEW COMPARISON:  Prior chest radiographs 05/12/2019 and earlier FINDINGS: Heart size within normal limits. No appreciable airspace consolidation or pulmonary edema. No evidence of pleural effusion or pneumothorax. No acute bony abnormality identified.  IMPRESSION: No evidence of active cardiopulmonary disease. Electronically Signed   By: Jackey LogeKyle  Golden DO   On: 03/01/2020 10:52   US Abdomen Complete  Result Date: 03/01/2020 CLINICAL DATA:  44 year old female with generalized abdominal pain for 1 day. EXAM: ABDOMEN ULTRASOUND COMPLETE COMPARISON:  CT Abdomen and Pelvis 01/26/2020, and earlier. FINDINGS: Gallbladder: Multiple small echogenic foci within the lumen compatible with gallstones, the largest about 8 mm (image 12). Some of the smaller foci might also be tiny gallbladder polyps (inconsequential). No gallbladder wall thickening. No pericholecystic fluid. Equivocal sonographic Murphy sign.  Common bile duct: Diameter: 7 mm, mildly dilated. No filling defect identified within the visible duct. Liver: Liver echogenicity within normal limits. Mildly coarse hepatic echotexture. No discrete liver lesion. No intrahepatic biliary ductal dilatation. Portal vein is patent on color Doppler imaging with normal direction of blood flow towards the liver. IVC: No abnormality visualized. Pancreas: Visualized portion unremarkable. Spleen: Size and appearance within normal limits. Right Kidney: Length: 9.8 cm. No hydronephrosis. Stable mildly lobulated liver contour. Nephrolithiasis is again evident in the lower pole (image 90). Left Kidney: Length: 9.3 cm. Echogenicity within normal limits. No mass or hydronephrosis visualized. Abdominal aorta: Incompletely visualized due to overlying bowel gas, visualized portions within normal limits. Other findings: None. IMPRESSION: 1. Positive for multiple small gallstones, and a mildly dilated CBD - suspicious for Choledocholithiasis. 2. No evidence of acute cholecystitis. 3. Chronic nephrolithiasis. Electronically Signed   By: Odessa FlemingH  Hall M.D.   On: 03/01/2020 13:23   MR 3D Recon At Scanner  Result Date: 03/01/2020 CLINICAL DATA:  Right upper quadrant pain. Cholelithiasis and biliary ductal dilatation. EXAM: MRI ABDOMEN WITHOUT AND WITH CONTRAST (INCLUDING MRCP) TECHNIQUE: Multiplanar multisequence MR imaging of the abdomen was performed both before and after the administration of intravenous contrast. Heavily T2-weighted images of the biliary and pancreatic ducts were obtained, and three-dimensional MRCP images were rendered by post processing. CONTRAST:  7mL GADAVIST GADOBUTROL 1 MMOL/ML IV SOLN COMPARISON:  Ultrasound on 03/01/2020 and CT on 01/26/2020 FINDINGS: Lower chest: No acute findings. Hepatobiliary: No hepatic masses identified. Gallbladder is distended and several tiny gallstones are seen. Mild gallbladder wall thickening is seen adjacent to the liver, however  there is no evidence of pericholecystic inflammatory changes. Mild diffuse biliary ductal dilatation is seen to the level of the ampulla, with common bile duct measuring 11 mm in diameter. No evidence of choledocholithiasis or biliary stricture. Pancreas:  No mass or inflammatory changes. Spleen:  Within normal limits in size and appearance. Adrenals/Urinary Tract: No masses identified. Mild right renal parenchymal scarring noted. No evidence of hydronephrosis. Stomach/Bowel: Visualized portion unremarkable. Vascular/Lymphatic: No pathologically enlarged lymph nodes identified. No abdominal aortic aneurysm. Other:  None. Musculoskeletal:  No suspicious bone lesions identified. IMPRESSION: Cholelithiasis and mild gallbladder wall thickening. Acute cholecystitis cannot be excluded. Consider nuclear medicine HIDA scan if clinically warranted. Mild diffuse biliary ductal dilatation to the level of the ampulla. No radiographic evidence of choledocholithiasis or other obstructing etiology. Electronically Signed   By: Danae OrleansJohn A Stahl M.D.   On: 03/01/2020 19:16   MR ABDOMEN MRCP W WO CONTAST  Result Date: 03/01/2020 CLINICAL DATA:  Right upper quadrant pain. Cholelithiasis and biliary ductal dilatation. EXAM: MRI ABDOMEN WITHOUT AND WITH CONTRAST (INCLUDING MRCP) TECHNIQUE: Multiplanar multisequence MR imaging of the abdomen was performed both before and after the administration of intravenous contrast. Heavily T2-weighted images of the biliary and pancreatic ducts were obtained, and three-dimensional MRCP images were rendered by post processing. CONTRAST:  7mL  GADAVIST GADOBUTROL 1 MMOL/ML IV SOLN COMPARISON:  Ultrasound on 03/01/2020 and CT on 01/26/2020 FINDINGS: Lower chest: No acute findings. Hepatobiliary: No hepatic masses identified. Gallbladder is distended and several tiny gallstones are seen. Mild gallbladder wall thickening is seen adjacent to the liver, however there is no evidence of pericholecystic  inflammatory changes. Mild diffuse biliary ductal dilatation is seen to the level of the ampulla, with common bile duct measuring 11 mm in diameter. No evidence of choledocholithiasis or biliary stricture. Pancreas:  No mass or inflammatory changes. Spleen:  Within normal limits in size and appearance. Adrenals/Urinary Tract: No masses identified. Mild right renal parenchymal scarring noted. No evidence of hydronephrosis. Stomach/Bowel: Visualized portion unremarkable. Vascular/Lymphatic: No pathologically enlarged lymph nodes identified. No abdominal aortic aneurysm. Other:  None. Musculoskeletal:  No suspicious bone lesions identified. IMPRESSION: Cholelithiasis and mild gallbladder wall thickening. Acute cholecystitis cannot be excluded. Consider nuclear medicine HIDA scan if clinically warranted. Mild diffuse biliary ductal dilatation to the level of the ampulla. No radiographic evidence of choledocholithiasis or other obstructing etiology. Electronically Signed   By: Danae Orleans M.D.   On: 03/01/2020 19:16    Microbiology: Recent Results (from the past 240 hour(s))  SARS Coronavirus 2 by RT PCR (hospital order, performed in [facility] hospital lab) Nasopharyngeal Nasopharyngeal Swab     Status: None   Collection Time: 03/01/20  3:19 PM   Specimen: Nasopharyngeal Swab  Result Value Ref Range Status   SARS Coronavirus 2 NEGATIVE NEGATIVE Final    Comment: (NOTE) SARS-CoV-2 target nucleic acids are NOT DETECTED.  The SARS-CoV-2 RNA is generally detectable in upper and lower respiratory specimens during the acute phase of infection. The lowest concentration of SARS-CoV-2 viral copies this assay can detect is 250 copies / mL. A negative result does not preclude SARS-CoV-2 infection and should not be used as the sole basis for treatment or other patient management decisions.  A negative result may occur with improper specimen collection / handling, submission of specimen other than  nasopharyngeal swab, presence of viral mutation(s) within the areas targeted by this assay, and inadequate number of viral copies (<250 copies / mL). A negative result must be combined with clinical observations, patient history, and epidemiological information.  Fact Sheet for Patients:   BoilerBrush.com.cy  Fact Sheet for Healthcare Providers: https://pope.com/  This test is not yet approved or  cleared by the [place] FDA and has been authorized for detection and/or diagnosis of SARS-CoV-2 by FDA under an Emergency Use Authorization (EUA).  This EUA will remain in effect (meaning this test can be used) for the duration of the COVID-19 declaration under Section 564(b)(1) of the Act, 21 U.S.C. section 360bbb-3(b)(1), unless the authorization is terminated or revoked sooner.  Performed at [facility], [address] [place], [place] [address]   MRSA PCR Screening     Status: None   Collection Time: 03/03/20  7:38 AM   Specimen: Nasal Mucosa; Nasopharyngeal  Result Value Ref Range Status   MRSA by PCR NEGATIVE NEGATIVE Final    Comment:        The GeneXpert MRSA Assay (FDA approved for NASAL specimens only), is one component of a comprehensive MRSA colonization surveillance program. It is not intended to diagnose MRSA infection nor to guide or monitor treatment for MRSA infections. Performed at [facility], [address], [place], [place] [address]      Labs: Basic Metabolic Panel: Recent Labs  Lab 03/01/20 1110 03/02/20 0501 03/03/20 0716  NA 141  142 139  K 3.0* 3.3* 4.5  CL 106 106 104  CO2 26 26 27   GLUCOSE 97 75 91  BUN 11 9 <5*  CREATININE 0.75 0.67 0.59  CALCIUM 8.7* 8.5* 9.0   Liver Function Tests: Recent Labs  Lab 03/01/20 1110 03/02/20 0501  AST 91* 46*  ALT 43 42  ALKPHOS 121 106  BILITOT 1.4* 0.8  PROT 6.7 5.8*  ALBUMIN 3.7 3.2*   Recent Labs  Lab 03/01/20 1110  LIPASE 27    CBC: Recent Labs  Lab 03/01/20 1110 03/02/20 0501  WBC 7.7 3.8*  HGB 12.6 10.8*  HCT 39.7 34.9*  MCV 97.5 98.0  PLT 292 242   CBG: Recent Labs  Lab 03/02/20 1616 03/02/20 2014 03/03/20 0501 03/03/20 0727 03/03/20 1341  GLUCAP 77 78 98 86 89    Signed:  03/05/20 MD.  [facility] 03/03/2020, 3:26 PM

## 2020-03-06 ENCOUNTER — Encounter (HOSPITAL_COMMUNITY): Payer: Self-pay | Admitting: General Surgery

## 2020-03-06 LAB — SURGICAL PATHOLOGY

## 2020-03-09 ENCOUNTER — Encounter: Payer: Self-pay | Admitting: General Surgery

## 2020-03-09 ENCOUNTER — Other Ambulatory Visit: Payer: Self-pay

## 2020-03-09 ENCOUNTER — Ambulatory Visit (INDEPENDENT_AMBULATORY_CARE_PROVIDER_SITE_OTHER): Payer: Medicaid Other | Admitting: General Surgery

## 2020-03-09 VITALS — BP 136/89 | HR 66 | Temp 97.4°F | Resp 12 | Ht 61.0 in | Wt 194.0 lb

## 2020-03-09 DIAGNOSIS — Z09 Encounter for follow-up examination after completed treatment for conditions other than malignant neoplasm: Secondary | ICD-10-CM

## 2020-03-09 MED ORDER — HYDROCODONE-ACETAMINOPHEN 10-325 MG PO TABS: 1.0000 | ORAL_TABLET | Freq: Four times a day (QID) | ORAL | 0 refills | Status: DC | PRN

## 2020-03-09 NOTE — Progress Notes (Signed)
Subjective:     Norma [name]  Here for follow-up, status post laparoscopic cholecystectomy.  Having mild incisional pain, but it is slowly resolving.  She denies any fever or chills. Objective:    BP 136/89   Pulse 66   Temp (!) 97.4 F (36.3 C) (Oral)   Resp 12   Ht 5\' 1"  (1.549 m)   Wt 194 lb (88 kg)   LMP 09/24/2015   SpO2 98%   BMI 36.66 kg/m   General:  alert, cooperative and no distress  Abdomen soft.  One 5 mm incision has opened up a little bit on the right side of the abdomen.  The others have healed well.  Resolving ecchymosis present.     Assessment:    Doing well postoperatively. Mild to moderate incisional pain which is to be expected.    Plan:   Will reorder her Norco for 1 more week.  I told her that was her last pain prescription.  She understands.  Neosporin ointment to the open wound as needed.  Follow-up here as needed.

## 2020-03-26 ENCOUNTER — Emergency Department (HOSPITAL_COMMUNITY)
Admission: EM | Admit: 2020-03-26 | Discharge: 2020-03-26 | Discharge status: Against Medical Advice | Payer: Medicaid Other

## 2020-03-26 NOTE — ED Notes (Signed)
Called for triage with no response. Pt not outside.

## 2020-03-30 ENCOUNTER — Ambulatory Visit: Payer: Medicaid Other | Admitting: General Surgery

## 2020-03-30 ENCOUNTER — Telehealth: Payer: Self-pay | Admitting: Family Medicine

## 2020-03-30 NOTE — Telephone Encounter (Signed)
Pt called 03/29/2020 requesting more pain medication. Spoke to Dr. Lovell Sheehan and she NTBS. Pt aware and apt made for 03/30/2020. Pt no showed. No refill on pain med without ov!

## 2020-09-29 ENCOUNTER — Other Ambulatory Visit (HOSPITAL_COMMUNITY): Payer: Self-pay | Admitting: Physician Assistant

## 2020-09-29 ENCOUNTER — Other Ambulatory Visit: Payer: Self-pay | Admitting: Physician Assistant

## 2020-09-29 DIAGNOSIS — R10819 Abdominal tenderness, unspecified site: Secondary | ICD-10-CM

## 2020-10-30 ENCOUNTER — Ambulatory Visit: Payer: Medicaid Other

## 2021-03-04 ENCOUNTER — Other Ambulatory Visit: Payer: Self-pay

## 2021-03-04 ENCOUNTER — Emergency Department
Admission: EM | Admit: 2021-03-04 | Discharge: 2021-03-04 | Disposition: A | Discharge status: Home / Self Care | Payer: Medicaid Other | Attending: Emergency Medicine | Admitting: Emergency Medicine

## 2021-03-04 ENCOUNTER — Emergency Department: Payer: Medicaid Other

## 2021-03-04 DIAGNOSIS — I10 Essential (primary) hypertension: Secondary | ICD-10-CM

## 2021-03-04 DIAGNOSIS — Z79899 Other long term (current) drug therapy: Secondary | ICD-10-CM | POA: Insufficient documentation

## 2021-03-04 DIAGNOSIS — Z7984 Long term (current) use of oral hypoglycemic drugs: Secondary | ICD-10-CM | POA: Insufficient documentation

## 2021-03-04 DIAGNOSIS — E119 Type 2 diabetes mellitus without complications: Secondary | ICD-10-CM | POA: Diagnosis not present

## 2021-03-04 DIAGNOSIS — F17210 Nicotine dependence, cigarettes, uncomplicated: Secondary | ICD-10-CM | POA: Diagnosis not present

## 2021-03-04 DIAGNOSIS — W228XXA Striking against or struck by other objects, initial encounter: Secondary | ICD-10-CM | POA: Insufficient documentation

## 2021-03-04 DIAGNOSIS — S90122A Contusion of left lesser toe(s) without damage to nail, initial encounter: Secondary | ICD-10-CM

## 2021-03-04 DIAGNOSIS — J449 Chronic obstructive pulmonary disease, unspecified: Secondary | ICD-10-CM | POA: Diagnosis not present

## 2021-03-04 DIAGNOSIS — Y92009 Unspecified place in unspecified non-institutional (private) residence as the place of occurrence of the external cause: Secondary | ICD-10-CM | POA: Diagnosis not present

## 2021-03-04 DIAGNOSIS — S99922A Unspecified injury of left foot, initial encounter: Secondary | ICD-10-CM | POA: Diagnosis present

## 2021-03-04 MED ORDER — IBUPROFEN 400 MG PO TABS
400.0000 mg | ORAL_TABLET | Freq: Once | ORAL | Status: AC
  Administered 2021-03-04: 400 mg via ORAL
  Filled 2021-03-04: qty 1

## 2021-03-04 MED ORDER — ACETAMINOPHEN 500 MG PO TABS
1000.0000 mg | ORAL_TABLET | Freq: Once | ORAL | Status: AC
  Administered 2021-03-04: 1000 mg via ORAL
  Filled 2021-03-04: qty 2

## 2021-03-04 NOTE — ED Triage Notes (Signed)
Pt states that last night she tripped on something in the house and hit her middle toe on the L foot and thinks it is broken- pt states it is swollen and bruised

## 2021-03-04 NOTE — Discharge Instructions (Addendum)
X-ray of your foot did not show fracture or dislocation.  I suspect you have sustained a bruising and contusion to your toe.  This should improve over the next couple of days.  I recommend wearing loose fitting shoes i.e. flip-flops.  He may take Tylenol or Profen for discomfort.  He may ice for 15 minutes at a time twice a day.  Blood pressure was also noted to be slightly elevated today which may be secondary to your pain although I recommend he have this rechecked by your primary care doctor in the next 2 or 3 days.

## 2021-03-04 NOTE — ED Provider Notes (Signed)
[facility] Emergency Department Provider Note  ____________________________________________   Event Date/Time   First MD Initiated Contact with Patient 03/04/21 1231     (approximate)  I have reviewed the triage vital signs and the nursing notes.   HISTORY  Chief Complaint Toe Pain   HPI Norma [name] is a 45 y.o. female with a past medical history of COPD, HTN, kidney stones, IBS, anxiety and tobacco abuse who presents for assessment of acute left toe pain that she states she sustained last night when she tripped hitting it on something in her house last night.  She denies any other injuries including head pain, neck pain or pain in any other extremity or the other toes other than the third toe.  She does note the pain seems to radiate towards the foot and she has a little bit of bruising laterally over the top of the third toe but right at the toe and foot joint.  No other recent injuries to this area.  No other recent sick symptoms including fevers, chills, cough, nausea, vomiting, [place] dysuria, rash or any other acute sick symptoms.         Past Medical History:  Diagnosis Date   AKI (acute kidney injury) (HCC) 06/04/2016   Anxiety    COPD (chronic obstructive pulmonary disease) (HCC)    Depression    History of kidney stones    Hypertension    IBS (irritable bowel syndrome)     Patient Active Problem List   Diagnosis Date Noted   Class 1 obesity due to excess calories without serious comorbidity with body mass index (BMI) of 34.0 to 34.9 in adult    Tobacco abuse    Chronic obstructive pulmonary disease (HCC)    Cholelithiasis 03/02/2020   HTN (hypertension) 03/01/2020   Diabetes (HCC) 03/01/2020   Choledocholithiasis 03/01/2020   Chronic low back pain (1ry area of Pain) (Bilateral) (L>R) w/o sciatica 09/22/2019   Cervicalgia 09/22/2019   Chronic neck pain (2ry area of Pain) (Bilateral) (L>R) 09/22/2019   Chronic lower extremity  pain (3ry area of Pain) (Bilateral) (L>R) 09/22/2019   Chronic knee pain (Bilateral) (L>R) 09/22/2019   Chronic shoulder pain (Right) 09/22/2019   Chronic pain syndrome 09/22/2019   Surgery, elective 09/22/2019   Disorder of skeletal system 09/22/2019   Problems influencing health status 09/22/2019   Bacteremia 06/04/2016   UTI (urinary tract infection) 06/04/2016   Abdominal pain, epigastric 06/04/2016   Nausea and vomiting 06/04/2016   Hypokalemia 06/04/2016   AKI (acute kidney injury) (HCC) 06/04/2016   Positive blood culture 06/04/2016   Diarrhea 06/04/2016   Endometriosis of pelvis 11/16/2015   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 11/09/2015   Rectal fissure 10/04/2015    Past Surgical History:  Procedure Laterality Date   ABDOMINAL HYSTERECTOMY N/A 11/09/2015   Procedure: HYSTERECTOMY ABDOMINAL;  Surgeon: Tilda Burrow, MD;  Location: [facility];  Service: Gynecology;  Laterality: N/A;   CHOLECYSTECTOMY N/A 03/03/2020   Procedure: LAPAROSCOPIC CHOLECYSTECTOMY;  Surgeon: Franky Macho, MD;  Location: [facility];  Service: General;  Laterality: N/A;   DILATION AND CURETTAGE OF UTERUS     SALPINGOOPHORECTOMY Bilateral 11/09/2015   Procedure: SALPINGO OOPHORECTOMY;  Surgeon: Tilda Burrow, MD;  Location: [facility];  Service: Gynecology;  Laterality: Bilateral;    Prior to Admission medications   Medication Sig Start Date End Date Taking? Authorizing Provider  albuterol (PROVENTIL HFA;VENTOLIN HFA) 108 (90 BASE) MCG/ACT inhaler Inhale 2 puffs into  the lungs every 6 (six) hours as needed for wheezing.    [provider]  amLODipine (NORVASC) 5 MG tablet Take 5 mg by mouth daily. 11/18/19   [provider]  benzonatate (TESSALON) 200 MG capsule Take 1 capsule by mouth 3 (three) times daily as needed. 11/18/19   [provider]  famotidine (PEPCID) 20 MG tablet Take 1 tablet (20 mg total) by mouth 2 (two) times daily. 01/26/20    Mesner, Barbara CowerJason, MD  HYDROcodone-acetaminophen (NORCO) 10-325 MG tablet Take 1 tablet by mouth every 6 (six) hours as needed. 03/09/20   Franky MachoJenkins, Mark, MD  ibuprofen (ADVIL) 800 MG tablet Take 800 mg by mouth 3 (three) times daily. 11/18/19   [provider]  metFORMIN (GLUCOPHAGE) 500 MG tablet Take 500 mg by mouth daily. 11/18/19   [provider]  montelukast (SINGULAIR) 10 MG tablet Take 10 mg by mouth daily as needed. 11/18/19   [provider]  ondansetron (ZOFRAN ODT) 4 MG disintegrating tablet 4mg  ODT q4 hours prn nausea/vomit 01/26/20   Mesner, Barbara CowerJason, MD  QUEtiapine (SEROQUEL) 50 MG tablet Take 50 mg by mouth at bedtime. 01/31/20   [provider]  QVAR 80 MCG/ACT inhaler Inhale 2 puffs into the lungs daily. 09/19/15   [provider]  sucralfate (CARAFATE) 1 GM/10ML suspension Take 10 mLs (1 g total) by mouth 4 (four) times daily -  with meals and at bedtime. 01/26/20   Mesner, Barbara CowerJason, MD  Vitamin D, Ergocalciferol, (DRISDOL) 1.25 MG (50000 UNIT) CAPS capsule Take 50,000 Units by mouth every 7 (seven) days. fridays    [provider]    Allergies Sulfa antibiotics  Family History  Problem Relation Age of Onset   COPD Mother    Diabetes Father    Hypertension Father    Heart disease Sister    Heart disease Maternal Aunt    Birth defects Daughter    Kidney disease Daughter    Bladder Cancer Neg Hx    Kidney cancer Neg Hx     Social History Social History   Tobacco Use   Smoking status: Every Day    Packs/day: 1.50    Years: 19.00    Pack years: 28.50    Types: Cigarettes   Smokeless tobacco: Never  Vaping Use   Vaping Use: Never used  Substance Use Topics   Alcohol use: No    Alcohol/week: 0.0 standard drinks   Drug use: No    Review of Systems  Review of Systems  Constitutional:  Negative for chills and fever.  HENT:  Negative for sore throat.   Eyes:  Negative for pain.  Respiratory:  Negative for cough and stridor.    Cardiovascular:  Negative for chest pain.  Gastrointestinal:  Negative for vomiting.  Genitourinary:  Negative for dysuria.  Musculoskeletal:  Positive for joint pain (L 3rd metatarsal interphalangeal joint and third toe interphalangeal joint.) and myalgias (Left third toe.).  Skin:  Negative for rash.  Neurological:  Negative for seizures, loss of consciousness and headaches.  Psychiatric/Behavioral:  Negative for suicidal ideas.   All other systems reviewed and are negative.    ____________________________________________   PHYSICAL EXAM:  VITAL SIGNS: ED Triage Vitals  Enc Vitals Group     BP 03/04/21 1053 (!) 160/103     Pulse Rate 03/04/21 1053 94     Resp 03/04/21 1053 18     Temp 03/04/21 1053 98.6 F (37 C)     Temp Source 03/04/21 1053  Oral     SpO2 03/04/21 1053 100 %     Weight 03/04/21 1055 165 lb (74.8 kg)     Height 03/04/21 1055 5\' 2"  (1.575 m)     Head Circumference --      Peak Flow --      Pain Score 03/04/21 1055 8     Pain Loc --      Pain Edu? --      Excl. in GC? --    Vitals:   03/04/21 1053  BP: (!) 160/103  Pulse: 94  Resp: 18  Temp: 98.6 F (37 C)  SpO2: 100%   Physical Exam Vitals and nursing note reviewed.  Constitutional:      General: She is not in acute distress.    Appearance: She is well-developed. She is obese.  HENT:     Head: Normocephalic and atraumatic.     Right Ear: External ear normal.     Left Ear: External ear normal.     Nose: Nose normal.  Eyes:     Conjunctiva/sclera: Conjunctivae normal.  Cardiovascular:     Rate and Rhythm: Normal rate and regular rhythm.     Heart sounds: No murmur heard. Pulmonary:     Effort: Pulmonary effort is normal. No respiratory distress.     Breath sounds: Normal breath sounds.  Abdominal:     Palpations: Abdomen is soft.     Tenderness: There is no abdominal tenderness.  Musculoskeletal:     Cervical back: Neck supple.  Skin:    General: Skin is warm and dry.     Capillary  Refill: Capillary refill takes less than 2 seconds.  Neurological:     Mental Status: She is alert and oriented to person, place, and time.  Psychiatric:        Mood and Affect: Mood normal.    There is some ecchymosis edema tenderness over the dorsum of the left third toe with some ecchymosis extending proximally at the metatarsal phalangeal joint.  Remainder of toes are unremarkable.  No midfoot tenderness or significant tenderness of the dorsum of the foot.  Ankle is unremarkable.  2+ DP pulse.  Sensation is intact to light touch throughout the foot.  No other evidence of trauma to the foot or toes ____________________________________________   LABS (all labs ordered are listed, but only abnormal results are displayed)  Labs Reviewed - No data to display ____________________________________________  EKG  ____________________________________________  RADIOLOGY  ED MD interpretation: Plain film left foot shows no fracture dislocation of the foot or toes.  Official radiology report(s): DG Foot Complete Left  Result Date: 03/04/2021 CLINICAL DATA:  Tripped on something last night in her home, hitting the middle toe of the left foot. Pain and swelling. EXAM: LEFT FOOT - COMPLETE 3+ VIEW COMPARISON:  None. FINDINGS: No fracture or bone lesion. Joints are normally spaced and aligned. Small plantar calcaneal spur. Normal soft tissues. IMPRESSION: No fracture or dislocation. Electronically Signed   By: 03/06/2021 M.D.   On: 03/04/2021 13:18    ____________________________________________   PROCEDURES  Procedure(s) performed (including Critical Care):  Procedures   ____________________________________________   INITIAL IMPRESSION / ASSESSMENT AND PLAN / ED COURSE      Patient presents with above-stated history exam for assessment of acute left third toe pain that she thinks she may have broken when she hit it against something hard tripping yesterday.  She denies any other  associated injuries or symptoms.  She has not taken  any analgesia today.  On arrival she is hypertensive with BP of 160/103 with otherwise stable vital signs on room air.  She does have some erythema tenderness and edema over the dorsum of the third toe extending proximally to the phalangeal metatarsal joint but the remainder the foot is unremarkable and neurovascularly intact.  Plain film shows no fracture dislocation.  I suspect contusion.  Low suspicion for acute infectious process or other significant visceral or occult injury at this time.  Advised in writing to have her blood pressure rechecked in a couple days.  Discharged stable condition.  Strict return precautions advised and discussed      ____________________________________________   FINAL CLINICAL IMPRESSION(S) / ED DIAGNOSES  Final diagnoses:  Contusion of lesser toe of left foot without damage to nail, initial encounter  Hypertension, unspecified type    Medications  acetaminophen (TYLENOL) tablet 1,000 mg (1,000 mg Oral Given 03/04/21 1245)  ibuprofen (ADVIL) tablet 400 mg (400 mg Oral Given 03/04/21 1246)     ED Discharge Orders     None        Note:  This document was prepared using Dragon voice recognition software and may include unintentional dictation errors.    Gilles Chiquito, MD 03/04/21 1328

## 2021-03-08 ENCOUNTER — Other Ambulatory Visit (INDEPENDENT_AMBULATORY_CARE_PROVIDER_SITE_OTHER): Payer: Self-pay

## 2021-03-08 DIAGNOSIS — Z1211 Encounter for screening for malignant neoplasm of colon: Secondary | ICD-10-CM

## 2021-03-08 DIAGNOSIS — Z800 Family history of malignant neoplasm of digestive organs: Secondary | ICD-10-CM

## 2021-03-08 MED ORDER — NA SULFATE-K SULFATE-MG SULF 17.5-3.13-1.6 GM/177ML PO SOLN: 1.0000 | Freq: Once | ORAL | 0 refills | Status: AC

## 2021-03-08 NOTE — Progress Notes (Signed)
Gastroenterology Pre-Procedure Review  Request Date: 03/29/21 Requesting Physician: Dr. Maximino Greenland  PATIENT REVIEW QUESTIONS: The patient responded to the following health history questions as indicated:    1. Are you having any GI issues? Yes, constipation(sometimes), abdominal pain (comes & goes all over) 2. Do you have a personal history of Polyps? no 3. Do you have a family history of Colon Cancer or Polyps? yes (father colon cancer) 4. Diabetes Mellitus? no 5. Joint replacements in the past 12 months?no 6. Major health problems in the past 3 months?no 7. Any artificial heart valves, MVP, or defibrillator?no    MEDICATIONS & ALLERGIES:    Patient reports the following regarding taking any anticoagulation/antiplatelet therapy:   Plavix, Coumadin, Eliquis, Xarelto, Lovenox, Pradaxa, Brilinta, or Effient? no Aspirin? no  Patient confirms/reports the following medications:  Current Outpatient Medications  Medication Sig Dispense Refill   albuterol (PROVENTIL HFA;VENTOLIN HFA) 108 (90 BASE) MCG/ACT inhaler Inhale 2 puffs into the lungs every 6 (six) hours as needed for wheezing.     amLODipine (NORVASC) 5 MG tablet Take 5 mg by mouth daily.     benzonatate (TESSALON) 200 MG capsule Take 1 capsule by mouth 3 (three) times daily as needed.     famotidine (PEPCID) 20 MG tablet Take 1 tablet (20 mg total) by mouth 2 (two) times daily. 10 tablet 0   HYDROcodone-acetaminophen (NORCO) 10-325 MG tablet Take 1 tablet by mouth every 6 (six) hours as needed. 25 tablet 0   ibuprofen (ADVIL) 800 MG tablet Take 800 mg by mouth 3 (three) times daily.     metFORMIN (GLUCOPHAGE) 500 MG tablet Take 500 mg by mouth daily.     montelukast (SINGULAIR) 10 MG tablet Take 10 mg by mouth daily as needed.     ondansetron (ZOFRAN ODT) 4 MG disintegrating tablet 4mg  ODT q4 hours prn nausea/vomit 4 tablet 0   QUEtiapine (SEROQUEL) 50 MG tablet Take 50 mg by mouth at bedtime.     QVAR 80 MCG/ACT inhaler Inhale 2  puffs into the lungs daily.  5   sucralfate (CARAFATE) 1 GM/10ML suspension Take 10 mLs (1 g total) by mouth 4 (four) times daily -  with meals and at bedtime. 420 mL 0   Vitamin D, Ergocalciferol, (DRISDOL) 1.25 MG (50000 UNIT) CAPS capsule Take 50,000 Units by mouth every 7 (seven) days. fridays     No current facility-administered medications for this visit.    Patient confirms/reports the following allergies:  Allergies  Allergen Reactions   Sulfa Antibiotics     No orders of the defined types were placed in this encounter.   AUTHORIZATION INFORMATION Primary Insurance: 1D#: Group #:  Secondary Insurance: 1D#: Group #:  SCHEDULE INFORMATION: Date: 03/29/21 Time: Location: [facility]

## 2021-03-28 ENCOUNTER — Encounter: Payer: Self-pay | Admitting: Gastroenterology

## 2021-03-29 ENCOUNTER — Ambulatory Visit: Payer: Medicaid Other

## 2021-03-29 ENCOUNTER — Encounter
Admission: RE | Disposition: A | Discharge status: Home / Self Care | Payer: Self-pay | Source: Home / Self Care | Attending: Gastroenterology

## 2021-03-29 ENCOUNTER — Other Ambulatory Visit: Payer: Self-pay

## 2021-03-29 ENCOUNTER — Ambulatory Visit: Payer: Medicaid Other | Admitting: Anesthesiology

## 2021-03-29 ENCOUNTER — Encounter: Payer: Self-pay | Admitting: Gastroenterology

## 2021-03-29 ENCOUNTER — Ambulatory Visit
Admission: RE | Admit: 2021-03-29 | Discharge: 2021-03-29 | Disposition: A | Discharge status: Home / Self Care | Payer: Medicaid Other | Attending: Gastroenterology | Admitting: Gastroenterology

## 2021-03-29 DIAGNOSIS — Z800 Family history of malignant neoplasm of digestive organs: Secondary | ICD-10-CM | POA: Insufficient documentation

## 2021-03-29 DIAGNOSIS — Z1211 Encounter for screening for malignant neoplasm of colon: Secondary | ICD-10-CM

## 2021-03-29 DIAGNOSIS — K635 Polyp of colon: Secondary | ICD-10-CM | POA: Diagnosis not present

## 2021-03-29 DIAGNOSIS — Z79899 Other long term (current) drug therapy: Secondary | ICD-10-CM | POA: Insufficient documentation

## 2021-03-29 DIAGNOSIS — Z7951 Long term (current) use of inhaled steroids: Secondary | ICD-10-CM | POA: Insufficient documentation

## 2021-03-29 DIAGNOSIS — F17210 Nicotine dependence, cigarettes, uncomplicated: Secondary | ICD-10-CM | POA: Diagnosis not present

## 2021-03-29 DIAGNOSIS — Z882 Allergy status to sulfonamides status: Secondary | ICD-10-CM | POA: Diagnosis not present

## 2021-03-29 DIAGNOSIS — Z7984 Long term (current) use of oral hypoglycemic drugs: Secondary | ICD-10-CM | POA: Diagnosis not present

## 2021-03-29 DIAGNOSIS — Z791 Long term (current) use of non-steroidal anti-inflammatories (NSAID): Secondary | ICD-10-CM | POA: Diagnosis not present

## 2021-03-29 DIAGNOSIS — R109 Unspecified abdominal pain: Secondary | ICD-10-CM

## 2021-03-29 HISTORY — PX: COLONOSCOPY WITH PROPOFOL: SHX5780

## 2021-03-29 SURGERY — COLONOSCOPY WITH PROPOFOL
Anesthesia: General

## 2021-03-29 MED ORDER — PROPOFOL 10 MG/ML IV BOLUS
INTRAVENOUS | Status: DC | PRN
  Administered 2021-03-29: 90 mg via INTRAVENOUS

## 2021-03-29 MED ORDER — MIDAZOLAM HCL 2 MG/2ML IJ SOLN
INTRAMUSCULAR | Status: DC | PRN
  Administered 2021-03-29: 2 mg via INTRAVENOUS

## 2021-03-29 MED ORDER — PROPOFOL 500 MG/50ML IV EMUL
INTRAVENOUS | Status: AC
  Filled 2021-03-29: qty 50

## 2021-03-29 MED ORDER — LIDOCAINE HCL (PF) 2 % IJ SOLN
INTRAMUSCULAR | Status: AC
  Filled 2021-03-29: qty 5

## 2021-03-29 MED ORDER — LIDOCAINE HCL (CARDIAC) PF 100 MG/5ML IV SOSY
PREFILLED_SYRINGE | INTRAVENOUS | Status: DC | PRN
  Administered 2021-03-29: 50 mg via INTRAVENOUS

## 2021-03-29 MED ORDER — PHENYLEPHRINE HCL (PRESSORS) 10 MG/ML IV SOLN
INTRAVENOUS | Status: DC | PRN
  Administered 2021-03-29 (×2): 100 ug via INTRAVENOUS

## 2021-03-29 MED ORDER — SODIUM CHLORIDE 0.9 % IV SOLN: INTRAVENOUS | Status: DC

## 2021-03-29 MED ORDER — PROPOFOL 500 MG/50ML IV EMUL
INTRAVENOUS | Status: DC | PRN
  Administered 2021-03-29: 175 ug/kg/min via INTRAVENOUS

## 2021-03-29 MED ORDER — MIDAZOLAM HCL 2 MG/2ML IJ SOLN
INTRAMUSCULAR | Status: AC
  Filled 2021-03-29: qty 2

## 2021-03-29 NOTE — Progress Notes (Signed)
Patient had reported abdominal pain after the procedure.  She was examined and abdomen remained soft.  She was not having any nausea or vomiting.  She was advised to pass gas.  She was reassured that the colono polyp removed today did not require use of cautery.  An abdominal x-ray was obtained and showed a nonobstructive pattern of bowel gas, with no free air.  This was discussed with the patient as well.  Images personally reviewed.  Patient's abdominal pain improved at the time of discharge.  If pain reoccurs or worsens, patient advised to call us and she verbalized understanding.  Please note that patient was anxious prior to the procedure as well, and after the procedure wanted [place] to "hurry up"  with her vital signs as she wanted to go home.  She was advised that per protocol, vital signs are done at timed intervals.

## 2021-03-29 NOTE — Anesthesia Postprocedure Evaluation (Signed)
Anesthesia Post Note  Patient: CHERLY ERNO  Procedure(s) Performed: COLONOSCOPY WITH PROPOFOL  Patient location during evaluation: Endoscopy Anesthesia Type: General Level of consciousness: awake and alert Pain management: pain level controlled Vital Signs Assessment: post-procedure vital signs reviewed and stable Respiratory status: spontaneous breathing, nonlabored ventilation, respiratory function stable and patient connected to nasal cannula oxygen Cardiovascular status: blood pressure returned to baseline and stable Postop Assessment: no apparent nausea or vomiting Anesthetic complications: no   No notable events documented.   Last Vitals:  Vitals:   03/29/21 1015 03/29/21 1021  BP: (!) 116/58 (!) 125/91  Pulse: 88 83  Resp: 18 18  Temp:  (!) 35.9 C  SpO2: 99% 100%    Last Pain:  Vitals:   03/29/21 1021  TempSrc: Temporal  PainSc: 10-Worst pain ever                 Johny Blamer

## 2021-03-29 NOTE — Anesthesia Preprocedure Evaluation (Signed)
Anesthesia Evaluation  Patient identified by MRN, date of birth, ID band Patient awake    Reviewed: Allergy & Precautions, NPO status , Patient's Chart, lab work & pertinent test results  Airway Mallampati: III  TM Distance: >3 FB Neck ROM: full    Dental  (+) Chipped   Pulmonary neg pulmonary ROS, Current Smoker and Patient abstained from smoking.,    Pulmonary exam normal        Cardiovascular hypertension, Pt. on medications negative cardio ROS Normal cardiovascular exam     Neuro/Psych negative neurological ROS  negative psych ROS   GI/Hepatic negative GI ROS, Neg liver ROS,   Endo/Other  negative endocrine ROSdiabetes, Type 2  Renal/GU Renal diseasenegative Renal ROS  negative genitourinary   Musculoskeletal   Abdominal Normal abdominal exam  (+)   Peds  Hematology negative hematology ROS (+)   Anesthesia Other Findings Past Medical History: 06/04/2016: AKI (acute kidney injury) (HCC) No date: Anxiety No date: COPD (chronic obstructive pulmonary disease) (HCC) No date: Depression No date: History of kidney stones No date: Hypertension No date: IBS (irritable bowel syndrome)  Past Surgical History: 11/09/2015: ABDOMINAL HYSTERECTOMY; N/A     Comment:  Procedure: HYSTERECTOMY ABDOMINAL;  Surgeon: Tilda Burrow, MD;  Location: [facility];  Service: Gynecology;               Laterality: N/A; 03/03/2020: CHOLECYSTECTOMY; N/A     Comment:  Procedure: LAPAROSCOPIC CHOLECYSTECTOMY;  Surgeon:               Franky Macho, MD;  Location: [facility];  Service: General;               Laterality: N/A; No date: DILATION AND CURETTAGE OF UTERUS 11/09/2015: SALPINGOOPHORECTOMY; Bilateral     Comment:  Procedure: SALPINGO OOPHORECTOMY;  Surgeon: Tilda Burrow, MD;  Location: [facility];  Service: Gynecology;               Laterality: Bilateral;  BMI    Body Mass Index: 28.53 kg/m       Reproductive/Obstetrics negative OB ROS                             Anesthesia Physical Anesthesia Plan  ASA: 2  Anesthesia Plan: General   Post-op Pain Management:    Induction: Intravenous  PONV Risk Score and Plan: Propofol infusion and TIVA  Airway Management Planned: Natural Airway and Nasal Cannula  Additional Equipment:   Intra-op Plan:   Post-operative Plan:   Informed Consent: I have reviewed the patients History and Physical, chart, labs and discussed the procedure including the risks, benefits and alternatives for the proposed anesthesia with the patient or authorized representative who has indicated his/her understanding and acceptance.     Dental Advisory Given  Plan Discussed with: Anesthesiologist, CRNA and Surgeon  Anesthesia Plan Comments: (Patient consented for risks of anesthesia including but not limited to:  - adverse reactions to medications - risk of airway placement if required - damage to eyes, teeth, lips or other oral mucosa - nerve damage due to positioning  - sore throat or hoarseness - Damage to heart, brain, nerves, lungs, other parts of body or loss of life  Patient voiced understanding.)        Anesthesia Quick Evaluation

## 2021-03-29 NOTE — H&P (Signed)
Melodie Bouillon, MD [address], [place], [place], [address] [address], [address], [place], [place], [address] Phone: [phone]  Fax: [phone]  Primary Care Physician:  [name], MD   Pre-Procedure History & Physical: HPI:  Norma [name] is a 45 y.o. female is here for a colonoscopy.   Past Medical History:  Diagnosis Date   AKI (acute kidney injury) (HCC) 06/04/2016   Anxiety    COPD (chronic obstructive pulmonary disease) (HCC)    Depression    History of kidney stones    Hypertension    IBS (irritable bowel syndrome)     Past Surgical History:  Procedure Laterality Date   ABDOMINAL HYSTERECTOMY N/A 11/09/2015   Procedure: HYSTERECTOMY ABDOMINAL;  Surgeon: Tilda Burrow, MD;  Location: [facility];  Service: Gynecology;  Laterality: N/A;   CHOLECYSTECTOMY N/A 03/03/2020   Procedure: LAPAROSCOPIC CHOLECYSTECTOMY;  Surgeon: Franky Macho, MD;  Location: [facility];  Service: General;  Laterality: N/A;   DILATION AND CURETTAGE OF UTERUS     SALPINGOOPHORECTOMY Bilateral 11/09/2015   Procedure: SALPINGO OOPHORECTOMY;  Surgeon: Tilda Burrow, MD;  Location: [facility];  Service: Gynecology;  Laterality: Bilateral;    Prior to Admission medications   Medication Sig Start Date End Date Taking? Authorizing Provider  albuterol (PROVENTIL HFA;VENTOLIN HFA) 108 (90 BASE) MCG/ACT inhaler Inhale 2 puffs into the lungs every 6 (six) hours as needed for wheezing.   Yes [provider]  amLODipine (NORVASC) 5 MG tablet Take 5 mg by mouth daily. 11/18/19  Yes [provider]  famotidine (PEPCID) 20 MG tablet Take 1 tablet (20 mg total) by mouth 2 (two) times daily. 01/26/20  Yes Mesner, Barbara Cower, MD  HYDROcodone-acetaminophen (NORCO) 10-325 MG tablet Take 1 tablet by mouth every 6 (six) hours as needed. 03/09/20  Yes Franky Macho, MD  metFORMIN (GLUCOPHAGE) 500 MG tablet Take 500 mg by mouth daily. 11/18/19  Yes [provider]  montelukast  (SINGULAIR) 10 MG tablet Take 10 mg by mouth daily as needed. 11/18/19  Yes [provider]  QUEtiapine (SEROQUEL) 50 MG tablet Take 50 mg by mouth at bedtime. 01/31/20  Yes [provider]  QVAR 80 MCG/ACT inhaler Inhale 2 puffs into the lungs daily. 09/19/15  Yes [provider]  sucralfate (CARAFATE) 1 GM/10ML suspension Take 10 mLs (1 g total) by mouth 4 (four) times daily -  with meals and at bedtime. 01/26/20  Yes Mesner, Barbara Cower, MD  Vitamin D, Ergocalciferol, (DRISDOL) 1.25 MG (50000 UNIT) CAPS capsule Take 50,000 Units by mouth every 7 (seven) days. fridays   Yes [provider]  benzonatate (TESSALON) 200 MG capsule Take 1 capsule by mouth 3 (three) times daily as needed. 11/18/19   [provider]  ibuprofen (ADVIL) 800 MG tablet Take 800 mg by mouth 3 (three) times daily. 11/18/19   [provider]  ondansetron (ZOFRAN ODT) 4 MG disintegrating tablet 4mg  ODT q4 hours prn nausea/vomit 01/26/20   Mesner, 01/28/20, MD    Allergies as of 03/08/2021 - Review Complete 03/04/2021  Allergen Reaction Noted   Sulfa antibiotics  03/04/2021    Family History  Problem Relation Age of Onset   COPD Mother    Diabetes Father    Hypertension Father    Heart disease Sister    Heart disease Maternal Aunt    Birth defects Daughter    Kidney disease Daughter    Bladder Cancer Neg Hx    Kidney cancer Neg Hx  Social History   Socioeconomic History   Marital status: Divorced    Spouse name: Not on file   Number of children: Not on file   Years of education: Not on file   Highest education level: Not on file  Occupational History   Not on file  Tobacco Use   Smoking status: Every Day    Packs/day: 1.50    Years: 19.00    Pack years: 28.50    Types: Cigarettes   Smokeless tobacco: Never  Vaping Use   Vaping Use: Never used  Substance and Sexual Activity   Alcohol use: No    Alcohol/week: 0.0 standard drinks   Drug use: No   Sexual  activity: Not Currently    Birth control/protection: None  Other Topics Concern   Not on file  Social History Narrative   Not on file   Social Determinants of Health   Financial Resource Strain: Not on file  Food Insecurity: Not on file  Transportation Needs: Not on file  Physical Activity: Not on file  Stress: Not on file  Social Connections: Not on file  Intimate Partner Violence: Not on file    Review of Systems: See HPI, otherwise negative ROS  Physical Exam: Constitutional: General:   Alert,  Well-developed, well-nourished, pleasant and cooperative in NAD BP 134/90   Pulse 87   Temp (!) 96.9 F (36.1 C) (Temporal)   Resp 17   Ht 5\' 2"  (1.575 m)   Wt 70.8 kg   LMP 09/24/2015   SpO2 97%   BMI 28.53 kg/m   Head: Normocephalic, atraumatic.   Eyes:  Sclera clear, no icterus.   Conjunctiva pink.   Mouth:  No deformity or lesions, oropharynx pink & moist.  Neck:  Supple, trachea midline  Respiratory: Normal respiratory effort  Gastrointestinal:  Soft, non-tender and non-distended without masses, hepatosplenomegaly or hernias noted.  No guarding or rebound tenderness.     Cardiac: No clubbing or edema.  No cyanosis. Normal posterior tibial pedal pulses noted.  Lymphatic:  No significant cervical adenopathy.  Psych:  Alert and cooperative. Normal mood and affect.  Musculoskeletal:   Symmetrical without gross deformities. 5/5 Lower extremity strength bilaterally.  Skin: Warm. Intact without significant lesions or rashes. No jaundice.  Neurologic:  Face symmetrical, tongue midline, Normal sensation to touch;  grossly normal neurologically.  Psych:  Alert and oriented x3, Alert and cooperative. Normal mood and affect.  Impression/Plan: Norma [name] is here for a colonoscopy to be performed for family history of colon cancer  Risks, benefits, limitations, and alternatives regarding  colonoscopy have been reviewed with the patient.  Questions have been  answered.  All parties agreeable.   [name], MD  03/29/2021, 9:37 AM

## 2021-03-29 NOTE — Op Note (Signed)
[facility] Gastroenterology Patient Name: Norma [name] Procedure Date: 03/29/2021 9:30 AM MRN: [id number] Account #: [id number] Date of Birth: Oct 03, 1975 Admit Type: Outpatient Age: 45 Room: [facility] ENDO ROOM 2 Gender: Female Note Status: Finalized Instrument Name: Nelda Marseille [id number] Procedure:             Colonoscopy Indications:           Screening in patient at increased risk: Family history                         of 1st-degree relative with colorectal cancer Providers:             Silvia Hightower B. Maximino Greenland MD, MD Referring MD:          Sallye Lat Md, MD (Referring MD) Medicines:             Monitored Anesthesia Care Complications:         No immediate complications. Procedure:             Pre-Anesthesia Assessment:                        - ASA Grade Assessment: II - A patient with mild                         systemic disease.                        - Prior to the procedure, a History and Physical was                         performed, and patient medications, allergies and                         sensitivities were reviewed. The patient's tolerance                         of previous anesthesia was reviewed.                        - The risks and benefits of the procedure and the                         sedation options and risks were discussed with the                         patient. All questions were answered and informed                         consent was obtained.                        - Patient identification and proposed procedure were                         verified prior to the procedure by the physician, the                         nurse, the anesthesiologist, the anesthetist and the  technician. The procedure was verified in the                         procedure room.                        After obtaining informed consent, the colonoscope was                         passed under direct vision. Throughout the procedure,                          the patient's blood pressure, pulse, and oxygen                         saturations were monitored continuously. The                         Colonoscope was introduced through the anus and                         advanced to the the cecum, identified by appendiceal                         orifice and ileocecal valve. The colonoscopy was                         performed with ease. The patient tolerated the                         procedure well. The quality of the bowel preparation                         was fair. Repeated clogging of the suction channel                         occured due to seeds and vegetable matter. This                         required cleaning of the scope button and unclogging                         of the suction channel Findings:      The perianal and digital rectal examinations were normal.      A 10 mm polyp was found in the descending colon. The polyp was flat.       Imaging was performed using white light and narrow band imaging to       visualize the mucosa. The polyp was removed with a cold snare. Resection       and retrieval were complete.      The exam was otherwise without abnormality.      The rectum, sigmoid colon, descending colon, transverse colon, ascending       colon and cecum appeared normal.      The retroflexed view of the distal rectum and anal verge was normal and       showed no anal or rectal abnormalities. Impression:            - Preparation of the colon was fair.                        -  One 10 mm polyp in the descending colon, removed                         with a cold snare. Resected and retrieved.                        - The examination was otherwise normal.                        - The rectum, sigmoid colon, descending colon,                         transverse colon, ascending colon and cecum are normal.                        - The distal rectum and anal verge are normal on                          retroflexion view. Recommendation:        - Await pathology results.                        - Discharge patient to home (with escort).                        - Advance diet as tolerated.                        - Continue present medications.                        - Repeat colonoscopy date to be determined after                         pending pathology results are reviewed.                        - The findings and recommendations were discussed with                         the patient.                        - The findings and recommendations were discussed with                         the patient's family.                        - Return to primary care physician as previously                         scheduled. Procedure Code(s):     --- Professional ---                        [phone], Colonoscopy, flexible; with removal of                         tumor(s), polyp(s), or other lesion(s) by snare  technique Diagnosis Code(s):     --- Professional ---                        Z80.0, Family history of malignant neoplasm of                         digestive organs                        K63.5, Polyp of colon CPT copyright 2019 [employer]. All rights reserved. The codes documented in this report are preliminary and upon coder review may  be revised to meet current compliance requirements.  Melodie Bouillon, MD Michel Bickers B. Maximino Greenland MD, MD 03/29/2021 10:19:29 AM This report has been signed electronically. Number of Addenda: 0 Note Initiated On: 03/29/2021 9:30 AM Scope Withdrawal Time: 0 hours 17 minutes 37 seconds  Total Procedure Duration: 0 hours 20 minutes 49 seconds  Estimated Blood Loss:  Estimated blood loss: none.      [facility]

## 2021-03-29 NOTE — Transfer of Care (Signed)
Immediate Anesthesia Transfer of Care Note  Patient: Norma [name]  Procedure(s) Performed: COLONOSCOPY WITH PROPOFOL  Patient Location: PACU  Anesthesia Type:General  Level of Consciousness: awake and drowsy  Airway & Oxygen Therapy: Patient Spontanous Breathing  Post-op Assessment: Report given to RN and Post -op Vital signs reviewed and stable  Post vital signs: Reviewed and stable  Last Vitals:  Vitals Value Taken Time  BP 116/58 03/29/21 1015  Temp    Pulse 88 03/29/21 1015  Resp 18 03/29/21 1015  SpO2 99 % 03/29/21 1015    Last Pain:  Vitals:   03/29/21 1013  TempSrc:   PainSc: 8          Complications: No notable events documented.

## 2021-03-29 NOTE — Anesthesia Procedure Notes (Signed)
Date/Time: 03/29/2021 9:37 AM Performed by: Ginger Carne, CRNA Pre-anesthesia Checklist: Patient identified, Emergency Drugs available, Suction available, Patient being monitored and Timeout performed Patient Re-evaluated:Patient Re-evaluated prior to induction Oxygen Delivery Method: Nasal cannula Preoxygenation: Pre-oxygenation with 100% oxygen Induction Type: IV induction

## 2021-04-01 LAB — SURGICAL PATHOLOGY

## 2021-04-03 ENCOUNTER — Encounter: Payer: Self-pay | Admitting: Gastroenterology

## 2021-04-22 ENCOUNTER — Other Ambulatory Visit: Payer: Self-pay

## 2021-04-22 ENCOUNTER — Emergency Department: Payer: Medicaid Other

## 2021-04-22 ENCOUNTER — Emergency Department
Admission: EM | Admit: 2021-04-22 | Discharge: 2021-04-22 | Disposition: A | Discharge status: Home / Self Care | Payer: Medicaid Other | Attending: Emergency Medicine | Admitting: Emergency Medicine

## 2021-04-22 DIAGNOSIS — Z79899 Other long term (current) drug therapy: Secondary | ICD-10-CM | POA: Insufficient documentation

## 2021-04-22 DIAGNOSIS — Z20822 Contact with and (suspected) exposure to covid-19: Secondary | ICD-10-CM | POA: Diagnosis not present

## 2021-04-22 DIAGNOSIS — I10 Essential (primary) hypertension: Secondary | ICD-10-CM | POA: Insufficient documentation

## 2021-04-22 DIAGNOSIS — J449 Chronic obstructive pulmonary disease, unspecified: Secondary | ICD-10-CM | POA: Insufficient documentation

## 2021-04-22 DIAGNOSIS — R000 Tachycardia, unspecified: Secondary | ICD-10-CM | POA: Diagnosis not present

## 2021-04-22 DIAGNOSIS — F17210 Nicotine dependence, cigarettes, uncomplicated: Secondary | ICD-10-CM | POA: Diagnosis not present

## 2021-04-22 DIAGNOSIS — J189 Pneumonia, unspecified organism: Secondary | ICD-10-CM | POA: Insufficient documentation

## 2021-04-22 DIAGNOSIS — Z7984 Long term (current) use of oral hypoglycemic drugs: Secondary | ICD-10-CM | POA: Diagnosis not present

## 2021-04-22 DIAGNOSIS — E119 Type 2 diabetes mellitus without complications: Secondary | ICD-10-CM | POA: Diagnosis not present

## 2021-04-22 DIAGNOSIS — R059 Cough, unspecified: Secondary | ICD-10-CM | POA: Diagnosis present

## 2021-04-22 LAB — CBC
HCT: 38.6 % (ref 36.0–46.0)
Hemoglobin: 13.1 g/dL (ref 12.0–15.0)
MCH: 31.2 pg (ref 26.0–34.0)
MCHC: 33.9 g/dL (ref 30.0–36.0)
MCV: 91.9 fL (ref 80.0–100.0)
Platelets: 227 10*3/uL (ref 150–400)
RBC: 4.2 MIL/uL (ref 3.87–5.11)
RDW: 15.1 % (ref 11.5–15.5)
WBC: 11 10*3/uL — ABNORMAL HIGH (ref 4.0–10.5)
nRBC: 0 % (ref 0.0–0.2)

## 2021-04-22 LAB — URINALYSIS, COMPLETE (UACMP) WITH MICROSCOPIC
Bacteria, UA: NONE SEEN
Bilirubin Urine: NEGATIVE
Glucose, UA: NEGATIVE mg/dL
Ketones, ur: 80 mg/dL — AB
Nitrite: NEGATIVE
Protein, ur: 30 mg/dL — AB
Specific Gravity, Urine: 1.025 (ref 1.005–1.030)
pH: 5 (ref 5.0–8.0)

## 2021-04-22 LAB — COMPREHENSIVE METABOLIC PANEL
ALT: 9 U/L (ref 0–44)
AST: 17 U/L (ref 15–41)
Albumin: 3.5 g/dL (ref 3.5–5.0)
Alkaline Phosphatase: 73 U/L (ref 38–126)
Anion gap: 11 (ref 5–15)
BUN: 18 mg/dL (ref 6–20)
CO2: 19 mmol/L — ABNORMAL LOW (ref 22–32)
Calcium: 8.2 mg/dL — ABNORMAL LOW (ref 8.9–10.3)
Chloride: 110 mmol/L (ref 98–111)
Creatinine, Ser: 0.93 mg/dL (ref 0.44–1.00)
GFR, Estimated: >60 mL/min (ref >60)
Glucose, Bld: 129 mg/dL — ABNORMAL HIGH (ref 70–99)
Potassium: 3.2 mmol/L — ABNORMAL LOW (ref 3.5–5.1)
Sodium: 140 mmol/L (ref 135–145)
Total Bilirubin: 0.4 mg/dL (ref 0.3–1.2)
Total Protein: 6.8 g/dL (ref 6.5–8.1)

## 2021-04-22 LAB — RESP PANEL BY RT-PCR (FLU A&B, COVID) ARPGX2
Influenza A by PCR: NEGATIVE
Influenza B by PCR: NEGATIVE
SARS Coronavirus 2 by RT PCR: NEGATIVE

## 2021-04-22 LAB — LIPASE, BLOOD: Lipase: 21 U/L (ref 11–51)

## 2021-04-22 MED ORDER — AZITHROMYCIN 250 MG PO TABS: ORAL_TABLET | ORAL | 0 refills | Status: DC

## 2021-04-22 MED ORDER — CEPHALEXIN 500 MG PO CAPS: 500.0000 mg | ORAL_CAPSULE | Freq: Four times a day (QID) | ORAL | 0 refills | Status: AC

## 2021-04-22 MED ORDER — SODIUM CHLORIDE 0.9 % IV BOLUS
1000.0000 mL | Freq: Once | INTRAVENOUS | Status: AC
  Administered 2021-04-22: 1000 mL via INTRAVENOUS

## 2021-04-22 MED ORDER — AZITHROMYCIN 250 MG PO TABS: ORAL_TABLET | ORAL | 0 refills | Status: AC

## 2021-04-22 MED ORDER — CEPHALEXIN 500 MG PO CAPS: 500.0000 mg | ORAL_CAPSULE | Freq: Four times a day (QID) | ORAL | 0 refills | Status: DC

## 2021-04-22 MED ORDER — ACETAMINOPHEN 500 MG PO TABS
1000.0000 mg | ORAL_TABLET | Freq: Once | ORAL | Status: AC
  Administered 2021-04-22: 1000 mg via ORAL
  Filled 2021-04-22: qty 2

## 2021-04-22 NOTE — ED Notes (Signed)
Went to check on patient, found her sitting up in bed, had removed monitoring devices. I asked if she needed anything, she asked for the doctor. I informed Derrill Kay patient would like to speak with him. Patient with very flat affect.

## 2021-04-22 NOTE — ED Provider Notes (Signed)
[facility] Emergency Department Provider Note   ____________________________________________   I have reviewed the triage vital signs and the nursing notes.   HISTORY  Chief Complaint Feels sick   History limited by: Not Limited   HPI Norma [name] is a 45 y.o. female who presents to the emergency department today with complaints of feeling sick.  Patient states that she has not felt well for roughly 1 week.  She states that she thinks she might have a head and chest cold.  She has had some congestion and cough and decreased appetite.  She denies trying any medications for this.  She does state that she is also been having issues with abdominal pain for roughly 2 or 3 years.  It does not sound like there has been any recent change to her abdominal pain.   Records reviewed. Per medical record review patient has a history of cholecystectomy  Past Medical History:  Diagnosis Date   AKI (acute kidney injury) (HCC) 06/04/2016   Anxiety    COPD (chronic obstructive pulmonary disease) (HCC)    Depression    History of kidney stones    Hypertension    IBS (irritable bowel syndrome)     Patient Active Problem List   Diagnosis Date Noted   Class 1 obesity due to excess calories without serious comorbidity with body mass index (BMI) of 34.0 to 34.9 in adult    Tobacco abuse    Chronic obstructive pulmonary disease (HCC)    Cholelithiasis 03/02/2020   HTN (hypertension) 03/01/2020   Diabetes (HCC) 03/01/2020   Choledocholithiasis 03/01/2020   Chronic low back pain (1ry area of Pain) (Bilateral) (L>R) w/o sciatica 09/22/2019   Cervicalgia 09/22/2019   Chronic neck pain (2ry area of Pain) (Bilateral) (L>R) 09/22/2019   Chronic lower extremity pain (3ry area of Pain) (Bilateral) (L>R) 09/22/2019   Chronic knee pain (Bilateral) (L>R) 09/22/2019   Chronic shoulder pain (Right) 09/22/2019   Chronic pain syndrome 09/22/2019   Surgery, elective 09/22/2019    Disorder of skeletal system 09/22/2019   Problems influencing health status 09/22/2019   Bacteremia 06/04/2016   UTI (urinary tract infection) 06/04/2016   Abdominal pain, epigastric 06/04/2016   Nausea and vomiting 06/04/2016   Hypokalemia 06/04/2016   AKI (acute kidney injury) (HCC) 06/04/2016   Positive blood culture 06/04/2016   Diarrhea 06/04/2016   Endometriosis of pelvis 11/16/2015   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 11/09/2015   Rectal fissure 10/04/2015    Past Surgical History:  Procedure Laterality Date   ABDOMINAL HYSTERECTOMY N/A 11/09/2015   Procedure: HYSTERECTOMY ABDOMINAL;  Surgeon: Tilda Burrow, MD;  Location: [facility];  Service: Gynecology;  Laterality: N/A;   CHOLECYSTECTOMY N/A 03/03/2020   Procedure: LAPAROSCOPIC CHOLECYSTECTOMY;  Surgeon: Franky Macho, MD;  Location: [facility] ORS;  Service: General;  Laterality: N/A;   COLONOSCOPY WITH PROPOFOL N/A 03/29/2021   Procedure: COLONOSCOPY WITH PROPOFOL;  Surgeon: Pasty Spillers, MD;  Location: [facility] ENDOSCOPY;  Service: Endoscopy;  Laterality: N/A;   DILATION AND CURETTAGE OF UTERUS     SALPINGOOPHORECTOMY Bilateral 11/09/2015   Procedure: SALPINGO OOPHORECTOMY;  Surgeon: Tilda Burrow, MD;  Location: [facility] OR[facility];  Service: Gynecology;  Laterality: Bilateral;    Prior to Admission medications   Medication Sig Start Date End Date Taking? Authorizing Provider  albuterol (PROVENTIL HFA;VENTOLIN HFA) 108 (90 BASE) MCG/ACT inhaler Inhale 2 puffs into the lungs every 6 (six) hours as needed for wheezing.    [provider]  amLODipine (NORVASC) 5 MG tablet Take 5 mg by mouth daily. 11/18/19   [provider]  benzonatate (TESSALON) 200 MG capsule Take 1 capsule by mouth 3 (three) times daily as needed. 11/18/19   [provider]  famotidine (PEPCID) 20 MG tablet Take 1 tablet (20 mg total) by mouth 2 (two) times daily. 01/26/20   Mesner, Barbara Cower, MD   HYDROcodone-acetaminophen (NORCO) 10-325 MG tablet Take 1 tablet by mouth every 6 (six) hours as needed. 03/09/20   Franky Macho, MD  ibuprofen (ADVIL) 800 MG tablet Take 800 mg by mouth 3 (three) times daily. 11/18/19   [provider]  metFORMIN (GLUCOPHAGE) 500 MG tablet Take 500 mg by mouth daily. 11/18/19   [provider]  montelukast (SINGULAIR) 10 MG tablet Take 10 mg by mouth daily as needed. 11/18/19   [provider]  ondansetron (ZOFRAN ODT) 4 MG disintegrating tablet 4mg  ODT q4 hours prn nausea/vomit 01/26/20   Mesner, 01/28/20, MD  QUEtiapine (SEROQUEL) 50 MG tablet Take 50 mg by mouth at bedtime. 01/31/20   [provider]  QVAR 80 MCG/ACT inhaler Inhale 2 puffs into the lungs daily. 09/19/15   [provider]  sucralfate (CARAFATE) 1 GM/10ML suspension Take 10 mLs (1 g total) by mouth 4 (four) times daily -  with meals and at bedtime. 01/26/20   Mesner, 01/28/20, MD  Vitamin D, Ergocalciferol, (DRISDOL) 1.25 MG (50000 UNIT) CAPS capsule Take 50,000 Units by mouth every 7 (seven) days. fridays    [provider]    Allergies Sulfa antibiotics  Family History  Problem Relation Age of Onset   COPD Mother    Diabetes Father    Hypertension Father    Heart disease Sister    Heart disease Maternal Aunt    Birth defects Daughter    Kidney disease Daughter    Bladder Cancer Neg Hx    Kidney cancer Neg Hx     Social History Social History   Tobacco Use   Smoking status: Every Day    Packs/day: 1.50    Years: 19.00    Pack years: 28.50    Types: Cigarettes   Smokeless tobacco: Never  Vaping Use   Vaping Use: Never used  Substance Use Topics   Alcohol use: No    Alcohol/week: 0.0 standard drinks   Drug use: No    Review of Systems Constitutional: No fever/chills Eyes: No visual changes. ENT: Positive for congestion Cardiovascular: Denies chest pain. Respiratory: Positive for cough. Gastrointestinal: Positive for chronic  abdominal pain, nausea and vomiting.  Genitourinary: Negative for dysuria. Musculoskeletal: Negative for back pain. Skin: Negative for rash. Neurological: Negative for headaches, focal weakness or numbness.  ____________________________________________   PHYSICAL EXAM:  VITAL SIGNS: ED Triage Vitals  Enc Vitals Group     BP 04/22/21 1450 117/81     Pulse Rate 04/22/21 1450 (!) 112     Resp 04/22/21 1450 (!) 22     Temp 04/22/21 1450 98.6 F (37 C)     Temp Source 04/22/21 1450 Oral     SpO2 04/22/21 1450 95 %     Weight 04/22/21 1451 155 lb (70.3 kg)     Height 04/22/21 1451 5\' 1"  (1.549 m)     Head Circumference --      Peak Flow --      Pain Score 04/22/21 1451 6    Constitutional: Alert and oriented.  Eyes: Conjunctivae are normal.  ENT  Head: Normocephalic and atraumatic.      Nose: No congestion/rhinnorhea.      Mouth/Throat: Mucous membranes are moist.      Neck: No stridor. Hematological/Lymphatic/Immunilogical: No cervical lymphadenopathy. Cardiovascular: Normal rate, regular rhythm.  No murmurs, rubs, or gallops.  Respiratory: Normal respiratory effort without tachypnea nor retractions. Breath sounds are clear and equal bilaterally. No wheezes/rales/rhonchi. Gastrointestinal: Soft and non tender. No rebound. No guarding.  Genitourinary: Deferred Musculoskeletal: Normal range of motion in all extremities. No lower extremity edema. Neurologic:  Normal speech and language. No gross focal neurologic deficits are appreciated.  Skin:  Skin is warm, dry and intact. No rash noted. Psychiatric: Mood and affect are normal. Speech and behavior are normal. Patient exhibits appropriate insight and judgment.  ____________________________________________    LABS (pertinent positives/negatives)  CBC wbc 11.0, hgb 13.1, plt 227 Lipase 21 CMP na 140, k 3.2, glu 129, cr 0.93 ____________________________________________   EKG  I, Phineas Semen, attending  physician, personally viewed and interpreted this EKG  EKG Time: 1459 Rate: 103 Rhythm: sinus tachycardia Axis: normal Intervals: qtc 445 QRS: RSR' in V1 ST changes: no st elevation Impression: abnormal ekg  ____________________________________________    RADIOLOGY  CXR Concern for right lung pneumonia  ____________________________________________   PROCEDURES  Procedures  ____________________________________________   INITIAL IMPRESSION / ASSESSMENT AND PLAN / ED COURSE  Pertinent labs & imaging results that were available during my care of the patient were reviewed by me and considered in my medical decision making (see chart for details).   Patient presents to the emergency department today because of concern for feeling sick for roughly 1 week. The patient was found to have pneumonia which would also explain her feelings of cough. Additionally urine is concerning for possible UTI. Because of this will start patient on antibiotics for both pneumonia and UTI. Discussed findings with patient.  ____________________________________________   FINAL CLINICAL IMPRESSION(S) / ED DIAGNOSES  Final diagnoses:  Community acquired pneumonia, unspecified laterality     Note: This dictation was prepared with Office manager. Any transcriptional errors that result from this process are unintentional     Phineas Semen, MD 04/22/21 [phone]

## 2021-04-22 NOTE — ED Notes (Signed)
COVID swabbed obtained but very poor sample because patient would not let me insert the swab into her nose and immediately grabbed it out when I attempted

## 2021-04-22 NOTE — ED Notes (Signed)
Patient c/o feeling "bad" for a week now. States today she started feeling worse, c/o nausea, denies vomiting or diarrhea.

## 2021-04-22 NOTE — Discharge Instructions (Addendum)
Please seek medical attention for any high fevers, chest pain, shortness of breath, change in behavior, persistent vomiting, bloody stool or any other new or concerning symptoms.  

## 2021-04-22 NOTE — ED Triage Notes (Addendum)
Pt to ER via POV with complaints of emesis, diarrhea, and poor appetite x1 week and right upper quadrant pain. Reports possible dehydration. Denies known fevers. Reports some urinary frequency, denies dyuria.

## 2021-06-10 ENCOUNTER — Encounter: Payer: Self-pay | Admitting: Family Medicine

## 2021-06-21 ENCOUNTER — Emergency Department: Payer: No Typology Code available for payment source

## 2021-06-21 ENCOUNTER — Emergency Department
Admission: EM | Admit: 2021-06-21 | Discharge: 2021-06-21 | Disposition: A | Discharge status: Other Acute Inpatient Hospital | Payer: No Typology Code available for payment source | Attending: Emergency Medicine | Admitting: Emergency Medicine

## 2021-06-21 ENCOUNTER — Other Ambulatory Visit: Payer: Self-pay

## 2021-06-21 ENCOUNTER — Encounter: Payer: Self-pay | Admitting: Emergency Medicine

## 2021-06-21 DIAGNOSIS — S3991XA Unspecified injury of abdomen, initial encounter: Secondary | ICD-10-CM | POA: Diagnosis present

## 2021-06-21 DIAGNOSIS — S36892A Contusion of other intra-abdominal organs, initial encounter: Secondary | ICD-10-CM | POA: Diagnosis not present

## 2021-06-21 DIAGNOSIS — I10 Essential (primary) hypertension: Secondary | ICD-10-CM | POA: Diagnosis not present

## 2021-06-21 DIAGNOSIS — Y92410 Unspecified street and highway as the place of occurrence of the external cause: Secondary | ICD-10-CM | POA: Insufficient documentation

## 2021-06-21 DIAGNOSIS — F17210 Nicotine dependence, cigarettes, uncomplicated: Secondary | ICD-10-CM | POA: Insufficient documentation

## 2021-06-21 DIAGNOSIS — E119 Type 2 diabetes mellitus without complications: Secondary | ICD-10-CM | POA: Diagnosis not present

## 2021-06-21 DIAGNOSIS — J449 Chronic obstructive pulmonary disease, unspecified: Secondary | ICD-10-CM | POA: Insufficient documentation

## 2021-06-21 DIAGNOSIS — Z7984 Long term (current) use of oral hypoglycemic drugs: Secondary | ICD-10-CM | POA: Diagnosis not present

## 2021-06-21 DIAGNOSIS — Z79899 Other long term (current) drug therapy: Secondary | ICD-10-CM | POA: Insufficient documentation

## 2021-06-21 DIAGNOSIS — M545 Low back pain, unspecified: Secondary | ICD-10-CM | POA: Diagnosis not present

## 2021-06-21 LAB — COMPREHENSIVE METABOLIC PANEL
ALT: 13 U/L (ref 0–44)
AST: 19 U/L (ref 15–41)
Albumin: 3.6 g/dL (ref 3.5–5.0)
Alkaline Phosphatase: 53 U/L (ref 38–126)
Anion gap: 8 (ref 5–15)
BUN: 18 mg/dL (ref 6–20)
CO2: 19 mmol/L — ABNORMAL LOW (ref 22–32)
Calcium: 8.6 mg/dL — ABNORMAL LOW (ref 8.9–10.3)
Chloride: 111 mmol/L (ref 98–111)
Creatinine, Ser: 0.76 mg/dL (ref 0.44–1.00)
GFR, Estimated: >60 mL/min (ref >60)
Glucose, Bld: 110 mg/dL — ABNORMAL HIGH (ref 70–99)
Potassium: 3.3 mmol/L — ABNORMAL LOW (ref 3.5–5.1)
Sodium: 138 mmol/L (ref 135–145)
Total Bilirubin: 0.4 mg/dL (ref 0.3–1.2)
Total Protein: 6.3 g/dL — ABNORMAL LOW (ref 6.5–8.1)

## 2021-06-21 LAB — URINALYSIS, ROUTINE W REFLEX MICROSCOPIC
Bilirubin Urine: NEGATIVE
Glucose, UA: NEGATIVE mg/dL
Ketones, ur: NEGATIVE mg/dL
Nitrite: NEGATIVE
Protein, ur: >300 mg/dL — AB
Specific Gravity, Urine: >1.03 — ABNORMAL HIGH (ref 1.005–1.030)
pH: 6 (ref 5.0–8.0)

## 2021-06-21 LAB — CBC
HCT: 38.6 % (ref 36.0–46.0)
Hemoglobin: 12.2 g/dL (ref 12.0–15.0)
MCH: 29.1 pg (ref 26.0–34.0)
MCHC: 31.6 g/dL (ref 30.0–36.0)
MCV: 92.1 fL (ref 80.0–100.0)
Platelets: 357 10*3/uL (ref 150–400)
RBC: 4.19 MIL/uL (ref 3.87–5.11)
RDW: 14.5 % (ref 11.5–15.5)
WBC: 9.1 10*3/uL (ref 4.0–10.5)
nRBC: 0 % (ref 0.0–0.2)

## 2021-06-21 LAB — URINALYSIS, MICROSCOPIC (REFLEX)
Bacteria, UA: NONE SEEN
RBC / HPF: >50 RBC/hpf (ref 0–5)
WBC, UA: >50 WBC/hpf (ref 0–5)

## 2021-06-21 LAB — LIPASE, BLOOD: Lipase: 40 U/L (ref 11–51)

## 2021-06-21 MED ORDER — IOHEXOL 300 MG/ML  SOLN
60.0000 mL | Freq: Once | INTRAMUSCULAR | Status: AC | PRN
  Administered 2021-06-21: 60 mL via INTRAVENOUS

## 2021-06-21 MED ORDER — IOHEXOL 300 MG/ML  SOLN
100.0000 mL | Freq: Once | INTRAMUSCULAR | Status: AC | PRN
  Administered 2021-06-21: 100 mL via INTRAVENOUS

## 2021-06-21 MED ORDER — FENTANYL CITRATE PF 50 MCG/ML IJ SOSY
50.0000 ug | PREFILLED_SYRINGE | Freq: Once | INTRAMUSCULAR | Status: AC
  Administered 2021-06-21: 50 ug via INTRAVENOUS
  Filled 2021-06-21: qty 1

## 2021-06-21 MED ORDER — SODIUM CHLORIDE 0.9 % IV SOLN: Freq: Once | INTRAVENOUS | Status: DC

## 2021-06-21 NOTE — ED Notes (Signed)
EMTALA reviewed by this RN.  Transfer consent signed. ?
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
## 2021-06-21 NOTE — ED Notes (Signed)
Per Dr. Derrill Kay need to check pts BP before he will order pain medication.
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
## 2021-06-21 NOTE — ED Notes (Signed)
Patient transported to CT 

## 2021-06-21 NOTE — ED Notes (Signed)
Pt family to front desk requesting pain medication for patient. Message sent to [facility].

## 2021-06-21 NOTE — ED Notes (Signed)
Called [facility] trauma  ( [place]) transfer per Derrill Kay , MD. [facility] faxed and images powershared
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
## 2021-06-21 NOTE — ED Notes (Signed)
Pt given warm blanket.

## 2021-08-07 ENCOUNTER — Ambulatory Visit (INDEPENDENT_AMBULATORY_CARE_PROVIDER_SITE_OTHER): Payer: Medicaid Other | Admitting: Gastroenterology

## 2021-08-07 ENCOUNTER — Encounter: Payer: Self-pay | Admitting: Gastroenterology

## 2021-08-07 ENCOUNTER — Other Ambulatory Visit: Payer: Self-pay

## 2021-08-07 VITALS — BP 123/84 | HR 69 | Temp 98.1°F | Ht 61.0 in | Wt 151.0 lb

## 2021-08-07 DIAGNOSIS — D509 Iron deficiency anemia, unspecified: Secondary | ICD-10-CM | POA: Diagnosis not present

## 2021-08-07 DIAGNOSIS — Z800 Family history of malignant neoplasm of digestive organs: Secondary | ICD-10-CM

## 2021-08-07 DIAGNOSIS — R1013 Epigastric pain: Secondary | ICD-10-CM

## 2021-08-07 DIAGNOSIS — Z86010 Personal history of colonic polyps: Secondary | ICD-10-CM

## 2021-08-07 MED ORDER — ONDANSETRON HCL 4 MG PO TABS: 4.0000 mg | ORAL_TABLET | Freq: Three times a day (TID) | ORAL | 1 refills | Status: DC | PRN

## 2021-08-07 MED ORDER — NA SULFATE-K SULFATE-MG SULF 17.5-3.13-1.6 GM/177ML PO SOLN: 354.0000 mL | Freq: Once | ORAL | 0 refills | Status: AC
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
## 2021-08-08 ENCOUNTER — Telehealth: Payer: Self-pay

## 2021-08-08 LAB — COMPREHENSIVE METABOLIC PANEL
ALT: 9 IU/L (ref 0–32)
AST: 12 IU/L (ref 0–40)
Albumin/Globulin Ratio: 1.5 (ref 1.2–2.2)
Albumin: 3.6 g/dL — ABNORMAL LOW (ref 3.8–4.8)
Alkaline Phosphatase: 65 IU/L (ref 44–121)
BUN/Creatinine Ratio: 27 — ABNORMAL HIGH (ref 9–23)
BUN: 16 mg/dL (ref 6–24)
Bilirubin Total: <0.2 mg/dL (ref 0.0–1.2)
CO2: 25 mmol/L (ref 20–29)
Calcium: 8.6 mg/dL — ABNORMAL LOW (ref 8.7–10.2)
Chloride: 104 mmol/L (ref 96–106)
Creatinine, Ser: 0.6 mg/dL (ref 0.57–1.00)
Globulin, Total: 2.4 g/dL (ref 1.5–4.5)
Glucose: 85 mg/dL (ref 70–99)
Potassium: 3.8 mmol/L (ref 3.5–5.2)
Sodium: 141 mmol/L (ref 134–144)
Total Protein: 6 g/dL (ref 6.0–8.5)
eGFR: 113 mL/min/{1.73_m2} (ref >59)

## 2021-08-08 LAB — CBC
Hematocrit: 33.8 % — ABNORMAL LOW (ref 34.0–46.6)
Hemoglobin: 10.6 g/dL — ABNORMAL LOW (ref 11.1–15.9)
MCH: 27.2 pg (ref 26.6–33.0)
MCHC: 31.4 g/dL — ABNORMAL LOW (ref 31.5–35.7)
MCV: 87 fL (ref 79–97)
Platelets: 279 10*3/uL (ref 150–450)
RBC: 3.9 x10E6/uL (ref 3.77–5.28)
RDW: 13.5 % (ref 11.7–15.4)
WBC: 4.1 10*3/uL (ref 3.4–10.8)

## 2021-08-08 LAB — IRON,TIBC AND FERRITIN PANEL
Ferritin: 13 ng/mL — ABNORMAL LOW (ref 15–150)
Iron Saturation: 7 % — CL (ref 15–55)
Iron: 23 ug/dL — ABNORMAL LOW (ref 27–159)
Total Iron Binding Capacity: 310 ug/dL (ref 250–450)
UIBC: 287 ug/dL (ref 131–425)

## 2021-08-08 LAB — VITAMIN B12: Vitamin B-12: 698 pg/mL (ref 232–1245)

## 2021-08-08 MED ORDER — FUSION PLUS PO CAPS: 1.0000 | ORAL_CAPSULE | Freq: Every day | ORAL | 2 refills | Status: DC
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
## 2021-08-08 NOTE — Telephone Encounter (Signed)
Patient verbalized understanding sent fusion plus to the pharmacy. Patient states she has no ride to come to our office so she will just pick up the prescription from the pharmacy.

## 2021-09-05 ENCOUNTER — Telehealth: Payer: Self-pay

## 2021-09-05 ENCOUNTER — Encounter
Admission: RE | Disposition: A | Discharge status: Home / Self Care | Payer: Self-pay | Source: Home / Self Care | Attending: Gastroenterology

## 2021-09-05 ENCOUNTER — Ambulatory Visit: Payer: Medicaid Other | Admitting: Anesthesiology

## 2021-09-05 ENCOUNTER — Encounter: Payer: Self-pay | Admitting: Gastroenterology

## 2021-09-05 ENCOUNTER — Ambulatory Visit
Admission: RE | Admit: 2021-09-05 | Discharge: 2021-09-05 | Disposition: A | Discharge status: Home / Self Care | Payer: Medicaid Other | Attending: Gastroenterology | Admitting: Gastroenterology

## 2021-09-05 DIAGNOSIS — Z79899 Other long term (current) drug therapy: Secondary | ICD-10-CM | POA: Diagnosis not present

## 2021-09-05 DIAGNOSIS — F419 Anxiety disorder, unspecified: Secondary | ICD-10-CM | POA: Diagnosis not present

## 2021-09-05 DIAGNOSIS — Z860100 Personal history of colon polyps, unspecified: Secondary | ICD-10-CM

## 2021-09-05 DIAGNOSIS — J449 Chronic obstructive pulmonary disease, unspecified: Secondary | ICD-10-CM | POA: Insufficient documentation

## 2021-09-05 DIAGNOSIS — I10 Essential (primary) hypertension: Secondary | ICD-10-CM | POA: Insufficient documentation

## 2021-09-05 DIAGNOSIS — R1013 Epigastric pain: Secondary | ICD-10-CM | POA: Diagnosis present

## 2021-09-05 DIAGNOSIS — F32A Depression, unspecified: Secondary | ICD-10-CM | POA: Insufficient documentation

## 2021-09-05 DIAGNOSIS — K319 Disease of stomach and duodenum, unspecified: Secondary | ICD-10-CM | POA: Insufficient documentation

## 2021-09-05 DIAGNOSIS — R1011 Right upper quadrant pain: Secondary | ICD-10-CM | POA: Insufficient documentation

## 2021-09-05 DIAGNOSIS — K259 Gastric ulcer, unspecified as acute or chronic, without hemorrhage or perforation: Secondary | ICD-10-CM

## 2021-09-05 DIAGNOSIS — K209 Esophagitis, unspecified without bleeding: Secondary | ICD-10-CM | POA: Diagnosis not present

## 2021-09-05 DIAGNOSIS — K269 Duodenal ulcer, unspecified as acute or chronic, without hemorrhage or perforation: Secondary | ICD-10-CM

## 2021-09-05 DIAGNOSIS — Z800 Family history of malignant neoplasm of digestive organs: Secondary | ICD-10-CM

## 2021-09-05 DIAGNOSIS — F17210 Nicotine dependence, cigarettes, uncomplicated: Secondary | ICD-10-CM | POA: Insufficient documentation

## 2021-09-05 DIAGNOSIS — Z1211 Encounter for screening for malignant neoplasm of colon: Secondary | ICD-10-CM | POA: Insufficient documentation

## 2021-09-05 HISTORY — PX: COLONOSCOPY WITH PROPOFOL: SHX5780

## 2021-09-05 HISTORY — PX: ESOPHAGOGASTRODUODENOSCOPY (EGD) WITH PROPOFOL: SHX5813

## 2021-09-05 SURGERY — COLONOSCOPY WITH PROPOFOL
Anesthesia: General

## 2021-09-05 MED ORDER — PROPOFOL 500 MG/50ML IV EMUL
INTRAVENOUS | Status: DC | PRN
Start: 2021-09-05 — End: 2021-09-05
  Administered 2021-09-05: 175 ug/kg/min via INTRAVENOUS

## 2021-09-05 MED ORDER — SODIUM CHLORIDE 0.9 % IV SOLN
INTRAVENOUS | Status: DC
  Administered 2021-09-05: 20 mL/h via INTRAVENOUS

## 2021-09-05 MED ORDER — OMEPRAZOLE 40 MG PO CPDR: 40.0000 mg | DELAYED_RELEASE_CAPSULE | Freq: Two times a day (BID) | ORAL | 3 refills | Status: DC

## 2021-09-05 MED ORDER — PROPOFOL 500 MG/50ML IV EMUL
INTRAVENOUS | Status: AC
  Filled 2021-09-05: qty 50

## 2021-09-05 MED ORDER — LIDOCAINE HCL (CARDIAC) PF 100 MG/5ML IV SOSY
PREFILLED_SYRINGE | INTRAVENOUS | Status: DC | PRN
Start: 2021-09-05 — End: 2021-09-05
  Administered 2021-09-05: 100 mg via INTRAVENOUS

## 2021-09-05 MED ORDER — PHENYLEPHRINE 40 MCG/ML (10ML) SYRINGE FOR IV PUSH (FOR BLOOD PRESSURE SUPPORT)
PREFILLED_SYRINGE | INTRAVENOUS | Status: DC | PRN
Start: 2021-09-05 — End: 2021-09-05
  Administered 2021-09-05 (×2): 80 ug via INTRAVENOUS

## 2021-09-05 MED ORDER — PHENYLEPHRINE HCL-NACL 20-0.9 MG/250ML-% IV SOLN
INTRAVENOUS | Status: AC
  Filled 2021-09-05: qty 250

## 2021-09-05 MED ORDER — PROPOFOL 10 MG/ML IV BOLUS
INTRAVENOUS | Status: DC | PRN
  Administered 2021-09-05: 160 mg via INTRAVENOUS

## 2021-09-05 MED ORDER — ALBUTEROL SULFATE HFA 108 (90 BASE) MCG/ACT IN AERS
INHALATION_SPRAY | RESPIRATORY_TRACT | Status: AC
  Filled 2021-09-05: qty 6.7

## 2021-09-05 NOTE — Transfer of Care (Signed)
Immediate Anesthesia Transfer of Care Note  Patient: Norma [name]  Procedure(s) Performed: COLONOSCOPY WITH PROPOFOL ESOPHAGOGASTRODUODENOSCOPY (EGD) WITH PROPOFOL  Patient Location: Endoscopy Unit  Anesthesia Type:General  Level of Consciousness: drowsy  Airway & Oxygen Therapy: Patient Spontanous Breathing  Post-op Assessment: Report given to RN and Post -op Vital signs reviewed and stable  Post vital signs: Reviewed and stable  Last Vitals:  Vitals Value Taken Time  BP 89/66 09/05/21 0816  Temp    Pulse 69 09/05/21 0816  Resp 15 09/05/21 0816  SpO2 99 % 09/05/21 0816    Last Pain:  Vitals:   09/05/21 [id number]  TempSrc: Temporal         Complications: No notable events documented.

## 2021-09-05 NOTE — Anesthesia Preprocedure Evaluation (Signed)
Anesthesia Evaluation  Patient identified by MRN, date of birth, ID band Patient awake    Reviewed: Allergy & Precautions, NPO status , Patient's Chart, lab work & pertinent test results  Airway Mallampati: III  TM Distance: >3 FB Neck ROM: full    Dental  (+) Chipped   Pulmonary COPD,  COPD inhaler, Current SmokerPatient did not abstain from smoking.,    Pulmonary exam normal        Cardiovascular hypertension, Pt. on medications Normal cardiovascular exam  10/22: Sinus tachycardia RSR' in V1 or V2, right VCD or RVH Borderline T abnormalities, anterior leads   Neuro/Psych PSYCHIATRIC DISORDERS Anxiety Depression negative neurological ROS     GI/Hepatic Neg liver ROS, GERD  ,Epigastric pain   Endo/Other  negative endocrine ROS  Renal/GU      Musculoskeletal   Abdominal (+) + obese,   Peds  Hematology negative hematology ROS (+)   Anesthesia Other Findings Past Medical History: 06/04/2016: AKI (acute kidney injury) (HCC) No date: Anxiety 06/04/2016: Bacteremia 03/01/2020: Choledocholithiasis 03/02/2020: Cholelithiasis No date: COPD (chronic obstructive pulmonary disease) (HCC) No date: Depression 06/04/2016: Diarrhea No date: History of kidney stones No date: Hypertension 06/04/2016: Hypokalemia No date: IBS (irritable bowel syndrome) 06/04/2016: Nausea and vomiting 06/04/2016: Positive blood culture 10/04/2015: Rectal fissure 09/22/2019: Surgery, elective  Past Surgical History: 11/09/2015: ABDOMINAL HYSTERECTOMY; N/A     Comment:  Procedure: HYSTERECTOMY ABDOMINAL;  Surgeon: Tilda Burrow, MD;  Location: [facility];  Service: Gynecology;               Laterality: N/A; 03/03/2020: CHOLECYSTECTOMY; N/A     Comment:  Procedure: LAPAROSCOPIC CHOLECYSTECTOMY;  Surgeon:               Franky Macho, MD;  Location: [facility];  Service: General;               Laterality: N/A; 03/29/2021: COLONOSCOPY  WITH PROPOFOL; N/A     Comment:  Procedure: COLONOSCOPY WITH PROPOFOL;  Surgeon:               Pasty Spillers, MD;  Location: [facility];                Service: Endoscopy;  Laterality: N/A; No date: DILATION AND CURETTAGE OF UTERUS 11/09/2015: SALPINGOOPHORECTOMY; Bilateral     Comment:  Procedure: SALPINGO OOPHORECTOMY;  Surgeon: Tilda Burrow, MD;  Location: [facility];  Service: Gynecology;               Laterality: Bilateral;  BMI    Body Mass Index: 27.44 kg/m      Reproductive/Obstetrics negative OB ROS                             Anesthesia Physical Anesthesia Plan  ASA: 2  Anesthesia Plan: General   Post-op Pain Management:    Induction:   PONV Risk Score and Plan: Treatment may vary due to age or medical condition, TIVA and Propofol infusion  Airway Management Planned: Natural Airway and Nasal Cannula  Additional Equipment:   Intra-op Plan:   Post-operative Plan:   Informed Consent: I have reviewed the patients History and Physical, chart, labs and discussed the procedure including the risks, benefits and alternatives for the proposed anesthesia with the patient or authorized representative  who has indicated his/her understanding and acceptance.     Dental Advisory Given  Plan Discussed with: Anesthesiologist, CRNA and Surgeon  Anesthesia Plan Comments:         Anesthesia Quick Evaluation

## 2021-09-05 NOTE — Op Note (Signed)
[facility] Gastroenterology Patient Name: Norma [name] Procedure Date: 09/05/2021 7:09 AM MRN: [id number] Account #: [id number] Date of Birth: [birth year] Admit Type: Outpatient Age: 46 Room: [facility] ENDO ROOM 4 Gender: Female Note Status: Finalized Instrument Name: Peds Colonoscope [id number] Procedure:             Colonoscopy Indications:           Screening in patient at increased risk: Family history                         of 1st-degree relative with colorectal cancer before                         age 41 years Providers:             Toney Reil MD, MD Medicines:             General Anesthesia Complications:         No immediate complications. Estimated blood loss: None. Procedure:             Pre-Anesthesia Assessment:                        - Prior to the procedure, a History and Physical was                         performed, and patient medications and allergies were                         reviewed. The patient is competent. The risks and                         benefits of the procedure and the sedation options and                         risks were discussed with the patient. All questions                         were answered and informed consent was obtained.                         Patient identification and proposed procedure were                         verified by the physician, the nurse, the                         anesthesiologist, the anesthetist and the technician                         in the pre-procedure area in the procedure room in the                         endoscopy suite. Mental Status Examination: alert and                         oriented. Airway Examination: normal oropharyngeal                         airway  and neck mobility. Respiratory Examination:                         clear to auscultation. CV Examination: normal.                         Prophylactic Antibiotics: The patient does not require                          prophylactic antibiotics. Prior Anticoagulants: The                         patient has taken no previous anticoagulant or                         antiplatelet agents. ASA Grade Assessment: III - A                         patient with severe systemic disease. After reviewing                         the risks and benefits, the patient was deemed in                         satisfactory condition to undergo the procedure. The                         anesthesia plan was to use general anesthesia.                         Immediately prior to administration of medications,                         the patient was re-assessed for adequacy to receive                         sedatives. The heart rate, respiratory rate, oxygen                         saturations, blood pressure, adequacy of pulmonary                         ventilation, and response to care were monitored                         throughout the procedure. The physical status of the                         patient was re-assessed after the procedure.                        After obtaining informed consent, the colonoscope was                         passed under direct vision. Throughout the procedure,                         the patient's blood pressure, pulse, and oxygen  saturations were monitored continuously. The                         Colonoscope was introduced through the anus and                         advanced to the the cecum, identified by appendiceal                         orifice and ileocecal valve. The colonoscopy was                         performed without difficulty. The patient tolerated                         the procedure well. The quality of the bowel                         preparation was adequate to identify polyps 6 mm and                         larger in size. Findings:      The perianal and digital rectal examinations were normal. Pertinent       negatives include normal  sphincter tone and no palpable rectal lesions.      The entire examined colon appeared normal.      The retroflexed view of the distal rectum and anal verge was normal and       showed no anal or rectal abnormalities. Impression:            - The entire examined colon is normal.                        - The distal rectum and anal verge are normal on                         retroflexion view.                        - No specimens collected. Recommendation:        - Discharge patient to home (with escort).                        - Resume previous diet today.                        - Continue present medications.                        - Repeat colonoscopy in 5 years for surveillance given                         family h/o colon cancer. Procedure Code(s):     --- Professional ---                        R0076, Colorectal cancer screening; colonoscopy on                         individual at high risk Diagnosis Code(s):     ---  Professional ---                        Z80.0, Family history of malignant neoplasm of                         digestive organs CPT copyright 2019 American [employer] Association. All rights reserved. The codes documented in this report are preliminary and upon coder review may  be revised to meet current compliance requirements. Dr. Libby Maw Toney Reil MD, MD 09/05/2021 8:13:05 AM This report has been signed electronically. Number of Addenda: 0 Note Initiated On: 09/05/2021 7:09 AM Scope Withdrawal Time: 0 hours 7 minutes 48 seconds  Total Procedure Duration: 0 hours 10 minutes 17 seconds  Estimated Blood Loss:  Estimated blood loss: none.      [facility]

## 2021-09-05 NOTE — H&P (Signed)
Arlyss Repress, MD [address]  [address]  [place], [place] [address]  Main: [phone]  Fax: [phone] Pager: [phone]  Primary Care Physician:  [name], MD Primary Gastroenterologist:  Dr. Arlyss Repress  Pre-Procedure History & Physical: HPI:  Norma [name] is a 46 y.o. female is here for an endoscopy and colonoscopy.   Past Medical History:  Diagnosis Date   AKI (acute kidney injury) (HCC) 06/04/2016   Anxiety    Bacteremia 06/04/2016   Choledocholithiasis 03/01/2020   Cholelithiasis 03/02/2020   COPD (chronic obstructive pulmonary disease) (HCC)    Depression    Diarrhea 06/04/2016   History of kidney stones    Hypertension    Hypokalemia 06/04/2016   IBS (irritable bowel syndrome)    Nausea and vomiting 06/04/2016   Positive blood culture 06/04/2016   Rectal fissure 10/04/2015   Surgery, elective 09/22/2019    Past Surgical History:  Procedure Laterality Date   ABDOMINAL HYSTERECTOMY N/A 11/09/2015   Procedure: HYSTERECTOMY ABDOMINAL;  Surgeon: Tilda Burrow, MD;  Location: [facility];  Service: Gynecology;  Laterality: N/A;   CHOLECYSTECTOMY N/A 03/03/2020   Procedure: LAPAROSCOPIC CHOLECYSTECTOMY;  Surgeon: Franky Macho, MD;  Location: [facility];  Service: General;  Laterality: N/A;   COLONOSCOPY WITH PROPOFOL N/A 03/29/2021   Procedure: COLONOSCOPY WITH PROPOFOL;  Surgeon: Pasty Spillers, MD;  Location: [facility] ENDOSCOPY;  Service: Endoscopy;  Laterality: N/A;   DILATION AND CURETTAGE OF UTERUS     SALPINGOOPHORECTOMY Bilateral 11/09/2015   Procedure: SALPINGO OOPHORECTOMY;  Surgeon: Tilda Burrow, MD;  Location: [facility];  Service: Gynecology;  Laterality: Bilateral;    Prior to Admission medications   Medication Sig Start Date End Date Taking? Authorizing Provider  ADVAIR DISKUS 250-50 MCG/ACT AEPB Inhale 1 puff into the lungs 2 (two) times daily. 07/31/21  Yes [provider]  albuterol (PROVENTIL HFA;VENTOLIN HFA) 108 (90  BASE) MCG/ACT inhaler Inhale 2 puffs into the lungs every 6 (six) hours as needed for wheezing.   Yes [provider]  amLODipine (NORVASC) 5 MG tablet Take 5 mg by mouth daily. 11/18/19  Yes [provider]  FLUoxetine (PROZAC) 20 MG capsule Take 20 mg by mouth daily. 07/24/21  Yes [provider]  gabapentin (NEURONTIN) 100 MG capsule SMARTSIG:3 By Mouth 3 Times Daily 06/28/21  Yes [provider]  hydrOXYzine (ATARAX) 25 MG tablet Take by mouth. 07/12/21  Yes [provider]  Iron-FA-B Cmp-C-Biot-Probiotic (FUSION PLUS) CAPS Take 1 capsule by mouth daily. 08/08/21  Yes Carlisha Wisler, Loel Dubonnet, MD  lisinopril (ZESTRIL) 20 MG tablet Take 20 mg by mouth daily. 07/17/21  Yes [provider]  ondansetron (ZOFRAN) 4 MG tablet Take 1 tablet (4 mg total) by mouth every 8 (eight) hours as needed for nausea or vomiting. 08/07/21  Yes Avant Printy, Loel Dubonnet, MD  triamcinolone cream (KENALOG) 0.1 % SMARTSIG:1 Application Topical 2-3 Times Daily 03/06/21  Yes [provider]    Allergies as of 08/07/2021 - Review Complete 08/07/2021  Allergen Reaction Noted   Sulfa antibiotics  03/04/2021    Family History  Problem Relation Age of Onset   COPD Mother    Diabetes Father    Hypertension Father    Heart disease Sister    Heart disease Maternal Aunt    Birth defects Daughter    Kidney disease Daughter    Bladder Cancer Neg Hx    Kidney cancer Neg Hx     Social History   Socioeconomic  History   Marital status: Divorced    Spouse name: Not on file   Number of children: Not on file   Years of education: Not on file   Highest education level: Not on file  Occupational History   Not on file  Tobacco Use   Smoking status: Every Day    Packs/day: 1.50    Years: 19.00    Pack years: 28.50    Types: Cigarettes   Smokeless tobacco: Never  Vaping Use   Vaping Use: Never used  Substance and Sexual Activity   Alcohol use: No    Alcohol/week: 0.0  standard drinks   Drug use: No   Sexual activity: Not Currently    Birth control/protection: None  Other Topics Concern   Not on file  Social History Narrative   Not on file   Social Determinants of Health   Financial Resource Strain: Not on file  Food Insecurity: Not on file  Transportation Needs: Not on file  Physical Activity: Not on file  Stress: Not on file  Social Connections: Not on file  Intimate Partner Violence: Not on file    Review of Systems: See HPI, otherwise negative ROS  Physical Exam: BP 139/88    Pulse 80    Temp (!) 96.4 F (35.8 C) (Temporal)    Resp 20    Ht 5\' 2"  (1.575 m)    Wt 68 kg    LMP 09/24/2015    SpO2 99%    BMI 27.44 kg/m  General:   Alert,  pleasant and cooperative in NAD Head:  Normocephalic and atraumatic. Neck:  Supple; no masses or thyromegaly. Lungs:  Clear throughout to auscultation.    Heart:  Regular rate and rhythm. Abdomen:  Soft, nontender and nondistended. Normal bowel sounds, without guarding, and without rebound.   Neurologic:  Alert and  oriented x4;  grossly normal neurologically.  Impression/Plan: [name] is here for an endoscopy and colonoscopy to be performed for epigastric, RUQ pain, Family history of colon cancer in first-degree relative  Risks, benefits, limitations, and alternatives regarding  endoscopy and colonoscopy have been reviewed with the patient.  Questions have been answered.  All parties agreeable.   [name], MD  09/05/2021, 7:36 AM

## 2021-09-05 NOTE — Anesthesia Postprocedure Evaluation (Signed)
Anesthesia Post Note  Patient: BRYNNE DOANE  Procedure(s) Performed: COLONOSCOPY WITH PROPOFOL ESOPHAGOGASTRODUODENOSCOPY (EGD) WITH PROPOFOL  Patient location during evaluation: PACU Anesthesia Type: General Level of consciousness: awake and alert Pain management: pain level controlled Vital Signs Assessment: post-procedure vital signs reviewed and stable Respiratory status: spontaneous breathing, nonlabored ventilation and respiratory function stable Cardiovascular status: blood pressure returned to baseline and stable Postop Assessment: no apparent nausea or vomiting Anesthetic complications: no   No notable events documented.   Last Vitals:  Vitals:   09/05/21 0814 09/05/21 0816  BP: (!) 89/66 (!) 89/66  Pulse:  69  Resp:  15  Temp: (!) 36.1 C   SpO2:  99%    Last Pain:  Vitals:   09/05/21 0834  TempSrc:   PainSc: 0-No pain                 Foye Deer

## 2021-09-05 NOTE — Op Note (Signed)
[facility] Gastroenterology Patient Name: Norma [name] Procedure Date: 09/05/2021 7:19 AM MRN: [id number] Account #: [id number] Date of Birth: [birth year] Admit Type: Outpatient Age: 46 Room: [facility] ENDO ROOM 4 Gender: Female Note Status: Finalized Instrument Name: Upper Endoscope [id number] Procedure:             Upper GI endoscopy Indications:           Epigastric abdominal pain, Abdominal pain in the right                         upper quadrant Providers:             Toney Reil MD, MD Medicines:             General Anesthesia Complications:         No immediate complications. Estimated blood loss: None. Procedure:             Pre-Anesthesia Assessment:                        - Prior to the procedure, a History and Physical was                         performed, and patient medications and allergies were                         reviewed. The patient is competent. The risks and                         benefits of the procedure and the sedation options and                         risks were discussed with the patient. All questions                         were answered and informed consent was obtained.                         Patient identification and proposed procedure were                         verified by the physician, the nurse, the                         anesthesiologist, the anesthetist and the technician                         in the pre-procedure area in the procedure room in the                         endoscopy suite. Mental Status Examination: alert and                         oriented. Airway Examination: normal oropharyngeal                         airway and neck mobility. Respiratory Examination:                         clear  to auscultation. CV Examination: normal.                         Prophylactic Antibiotics: The patient does not require                         prophylactic antibiotics. Prior Anticoagulants: The                          patient has taken no previous anticoagulant or                         antiplatelet agents. ASA Grade Assessment: III - A                         patient with severe systemic disease. After reviewing                         the risks and benefits, the patient was deemed in                         satisfactory condition to undergo the procedure. The                         anesthesia plan was to use general anesthesia.                         Immediately prior to administration of medications,                         the patient was re-assessed for adequacy to receive                         sedatives. The heart rate, respiratory rate, oxygen                         saturations, blood pressure, adequacy of pulmonary                         ventilation, and response to care were monitored                         throughout the procedure. The physical status of the                         patient was re-assessed after the procedure.                        After obtaining informed consent, the endoscope was                         passed under direct vision. Throughout the procedure,                         the patient's blood pressure, pulse, and oxygen                         saturations were monitored continuously. The Endoscope  was introduced through the mouth, and advanced to the                         second part of duodenum. The upper GI endoscopy was                         accomplished without difficulty. The patient tolerated                         the procedure well. Findings:      One non-obstructing non-bleeding superficial duodenal ulcer with a clean       ulcer base (Forrest Class III) was found in the duodenal bulb. The       lesion was 12 mm in largest dimension. There is no evidence of       perforation.      The second portion of the duodenum was normal.      Multiple dispersed small erosions with stigmata of recent bleeding were       found  in the gastric fundus, in the gastric body and in the gastric       antrum. Biopsies were taken with a cold forceps for Helicobacter pylori       testing.      Few non-bleeding superficial gastric ulcers with a clean ulcer base       (Forrest Class III) were found in the gastric body. The largest lesion       was 3 mm in largest dimension.      LA Grade A (one or more mucosal breaks less than 5 mm, not extending       between tops of 2 mucosal folds) esophagitis with no bleeding was found       in the lower third of the esophagus. Biopsies were taken with a cold       forceps for histology. Impression:            - Non-obstructing non-bleeding duodenal ulcer with a                         clean ulcer base (Forrest Class III). Suspicious for                         stress induced etiology. There is no evidence of                         perforation.                        - Normal second portion of the duodenum.                        - Erosive gastropathy with stigmata of recent                         bleeding. Biopsied.                        - Non-bleeding gastric ulcers with a clean ulcer base                         (Forrest Class III).                        -  LA Grade A esophagitis with no bleeding. Biopsied. Recommendation:        - Await pathology results.                        - Use Prilosec (omeprazole) 40 mg PO BID for 3 months.                        - Follow an antireflux regimen.                        - Return to my office as previously scheduled.                        - Proceed with colonoscopy as scheduled                        See colonoscopy report Procedure Code(s):     --- Professional ---                        [phone], Esophagogastroduodenoscopy, flexible,                         transoral; with biopsy, single or multiple Diagnosis Code(s):     --- Professional ---                        K26.9, Duodenal ulcer, unspecified as acute or                          chronic, without hemorrhage or perforation                        K92.2, Gastrointestinal hemorrhage, unspecified                        K25.9, Gastric ulcer, unspecified as acute or chronic,                         without hemorrhage or perforation                        R10.13, Epigastric pain                        K20.90, Esophagitis, unspecified without bleeding                        R10.11, Right upper quadrant pain CPT copyright 2019 American Medical Association. All rights reserved. The codes documented in this report are preliminary and upon coder review may  be revised to meet current compliance requirements. Dr. Libby Maw Toney Reil MD, MD 09/05/2021 7:58:11 AM This report has been signed electronically. Number of Addenda: 0 Note Initiated On: 09/05/2021 7:19 AM Estimated Blood Loss:  Estimated blood loss: none.      [facility]

## 2021-09-05 NOTE — Telephone Encounter (Signed)
Made appointment for 12/03/21 and informed patient by [facility]

## 2021-09-05 NOTE — Telephone Encounter (Signed)
-----   Message from Toney Reil, MD sent at 09/05/2021  8:26 AM EST ----- Regarding: follow up Please make a follow up in 3 months Peptic ulcer disease  RV

## 2021-09-06 ENCOUNTER — Encounter: Payer: Self-pay | Admitting: Gastroenterology

## 2021-09-06 LAB — SURGICAL PATHOLOGY

## 2021-10-24 ENCOUNTER — Other Ambulatory Visit: Payer: Self-pay | Admitting: Gastroenterology

## 2021-11-27 ENCOUNTER — Emergency Department: Payer: No Typology Code available for payment source

## 2021-11-27 ENCOUNTER — Emergency Department
Admission: EM | Admit: 2021-11-27 | Discharge: 2021-11-27 | Disposition: A | Discharge status: Home / Self Care | Payer: No Typology Code available for payment source | Attending: Emergency Medicine | Admitting: Emergency Medicine

## 2021-11-27 ENCOUNTER — Other Ambulatory Visit: Payer: Self-pay

## 2021-11-27 DIAGNOSIS — R55 Syncope and collapse: Secondary | ICD-10-CM | POA: Diagnosis not present

## 2021-11-27 DIAGNOSIS — Y92410 Unspecified street and highway as the place of occurrence of the external cause: Secondary | ICD-10-CM | POA: Insufficient documentation

## 2021-11-27 DIAGNOSIS — N3000 Acute cystitis without hematuria: Secondary | ICD-10-CM | POA: Diagnosis not present

## 2021-11-27 DIAGNOSIS — I10 Essential (primary) hypertension: Secondary | ICD-10-CM | POA: Insufficient documentation

## 2021-11-27 DIAGNOSIS — J449 Chronic obstructive pulmonary disease, unspecified: Secondary | ICD-10-CM | POA: Insufficient documentation

## 2021-11-27 LAB — BASIC METABOLIC PANEL
Anion gap: 10 (ref 5–15)
BUN: 12 mg/dL (ref 6–20)
CO2: 25 mmol/L (ref 22–32)
Calcium: 8.6 mg/dL — ABNORMAL LOW (ref 8.9–10.3)
Chloride: 109 mmol/L (ref 98–111)
Creatinine, Ser: 0.86 mg/dL (ref 0.44–1.00)
GFR, Estimated: >60 mL/min (ref >60)
Glucose, Bld: 100 mg/dL — ABNORMAL HIGH (ref 70–99)
Potassium: 3.9 mmol/L (ref 3.5–5.1)
Sodium: 144 mmol/L (ref 135–145)

## 2021-11-27 LAB — CBC WITH DIFFERENTIAL/PLATELET
Abs Immature Granulocytes: 0.02 10*3/uL (ref 0.00–0.07)
Basophils Absolute: 0.1 10*3/uL (ref 0.0–0.1)
Basophils Relative: 1 %
Eosinophils Absolute: 0.2 10*3/uL (ref 0.0–0.5)
Eosinophils Relative: 4 %
HCT: 39.7 % (ref 36.0–46.0)
Hemoglobin: 12.4 g/dL (ref 12.0–15.0)
Immature Granulocytes: 0 %
Lymphocytes Relative: 29 %
Lymphs Abs: 1.7 10*3/uL (ref 0.7–4.0)
MCH: 28.7 pg (ref 26.0–34.0)
MCHC: 31.2 g/dL (ref 30.0–36.0)
MCV: 91.9 fL (ref 80.0–100.0)
Monocytes Absolute: 0.3 10*3/uL (ref 0.1–1.0)
Monocytes Relative: 6 %
Neutro Abs: 3.3 10*3/uL (ref 1.7–7.7)
Neutrophils Relative %: 60 %
Platelets: 260 10*3/uL (ref 150–400)
RBC: 4.32 MIL/uL (ref 3.87–5.11)
RDW: 15.9 % — ABNORMAL HIGH (ref 11.5–15.5)
WBC: 5.6 10*3/uL (ref 4.0–10.5)
nRBC: 0 % (ref 0.0–0.2)

## 2021-11-27 LAB — URINALYSIS, ROUTINE W REFLEX MICROSCOPIC
Bacteria, UA: NONE SEEN
Bilirubin Urine: NEGATIVE
Glucose, UA: NEGATIVE mg/dL
Ketones, ur: NEGATIVE mg/dL
Nitrite: NEGATIVE
Protein, ur: 30 mg/dL — AB
Specific Gravity, Urine: 1.024 (ref 1.005–1.030)
WBC, UA: >50 WBC/hpf — ABNORMAL HIGH (ref 0–5)
pH: 5 (ref 5.0–8.0)

## 2021-11-27 LAB — URINE DRUG SCREEN, QUALITATIVE (ARMC ONLY)
Amphetamines, Ur Screen: NOT DETECTED
Barbiturates, Ur Screen: NOT DETECTED
Benzodiazepine, Ur Scrn: NOT DETECTED
Cannabinoid 50 Ng, Ur ~~LOC~~: NOT DETECTED
Cocaine Metabolite,Ur ~~LOC~~: NOT DETECTED
MDMA (Ecstasy)Ur Screen: NOT DETECTED
Methadone Scn, Ur: NOT DETECTED
Opiate, Ur Screen: NOT DETECTED
Phencyclidine (PCP) Ur S: NOT DETECTED
Tricyclic, Ur Screen: NOT DETECTED

## 2021-11-27 LAB — TROPONIN I (HIGH SENSITIVITY)
Troponin I (High Sensitivity): 3 ng/L (ref <18)
Troponin I (High Sensitivity): 3 ng/L (ref <18)

## 2021-11-27 LAB — ETHANOL: Alcohol, Ethyl (B): <10 mg/dL (ref <10)

## 2021-11-27 MED ORDER — OXYCODONE-ACETAMINOPHEN 5-325 MG PO TABS
1.0000 | ORAL_TABLET | Freq: Once | ORAL | Status: AC
  Administered 2021-11-27: 1 via ORAL
  Filled 2021-11-27: qty 1

## 2021-11-27 MED ORDER — KETOROLAC TROMETHAMINE 30 MG/ML IJ SOLN
15.0000 mg | Freq: Once | INTRAMUSCULAR | Status: AC
  Administered 2021-11-27: 15 mg via INTRAVENOUS
  Filled 2021-11-27: qty 1

## 2021-11-27 MED ORDER — SODIUM CHLORIDE 0.9 % IV BOLUS
1000.0000 mL | Freq: Once | INTRAVENOUS | Status: AC
Start: 2021-11-27 — End: 2021-11-27
  Administered 2021-11-27: 1000 mL via INTRAVENOUS

## 2021-11-27 MED ORDER — CYCLOBENZAPRINE HCL 5 MG PO TABS: 5.0000 mg | ORAL_TABLET | Freq: Three times a day (TID) | ORAL | 0 refills | Status: DC | PRN

## 2021-11-27 MED ORDER — CEPHALEXIN 500 MG PO CAPS: 500.0000 mg | ORAL_CAPSULE | Freq: Two times a day (BID) | ORAL | 0 refills | Status: AC

## 2021-11-27 MED ORDER — LIDOCAINE 5 % EX PTCH: 1.0000 | MEDICATED_PATCH | Freq: Two times a day (BID) | CUTANEOUS | 0 refills | Status: DC

## 2021-11-27 MED ORDER — SODIUM CHLORIDE 0.9 % IV SOLN
1.0000 g | Freq: Once | INTRAVENOUS | Status: AC
  Administered 2021-11-27: 1 g via INTRAVENOUS
  Filled 2021-11-27: qty 10
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
## 2021-11-27 NOTE — ED Notes (Addendum)
BP cuff and pulse ox applied; pt on monitor.  ?
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
## 2021-11-27 NOTE — ED Provider Notes (Signed)
Went to evaluate the patient approximately 15 minutes ago, and notified by staff patient currently in CT having imaging completed.  Still at CT at this time.  Plan to have oncoming ED provider see and evaluate the patient. ?  Sharyn Creamer, MD ?11/27/21 1449 ? ?
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
## 2021-11-28 LAB — URINE CULTURE

## 2021-12-03 ENCOUNTER — Other Ambulatory Visit: Payer: Self-pay

## 2021-12-03 ENCOUNTER — Ambulatory Visit: Payer: Medicaid Other | Admitting: Gastroenterology
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
## 2022-01-03 ENCOUNTER — Encounter: Payer: Self-pay | Admitting: Neurology

## 2022-01-03 ENCOUNTER — Telehealth (HOSPITAL_COMMUNITY): Payer: Self-pay | Admitting: Pharmacy Technician

## 2022-01-03 ENCOUNTER — Other Ambulatory Visit (INDEPENDENT_AMBULATORY_CARE_PROVIDER_SITE_OTHER): Payer: Medicaid Other

## 2022-01-03 ENCOUNTER — Ambulatory Visit: Payer: Medicaid Other | Admitting: Neurology

## 2022-01-03 VITALS — BP 146/86 | HR 59 | Ht 62.0 in | Wt 162.0 lb

## 2022-01-03 DIAGNOSIS — R413 Other amnesia: Secondary | ICD-10-CM

## 2022-01-03 DIAGNOSIS — R55 Syncope and collapse: Secondary | ICD-10-CM

## 2022-01-03 DIAGNOSIS — Z87820 Personal history of traumatic brain injury: Secondary | ICD-10-CM | POA: Diagnosis not present

## 2022-01-03 DIAGNOSIS — G43009 Migraine without aura, not intractable, without status migrainosus: Secondary | ICD-10-CM

## 2022-01-03 LAB — TSH: TSH: 1.46 u[IU]/mL (ref 0.35–5.50)

## 2022-01-03 MED ORDER — AIMOVIG 140 MG/ML ~~LOC~~ SOAJ: 140.0000 mg | SUBCUTANEOUS | 5 refills | Status: DC

## 2022-01-03 MED ORDER — SUMATRIPTAN SUCCINATE 100 MG PO TABS: 100.0000 mg | ORAL_TABLET | Freq: Once | ORAL | 5 refills | Status: DC | PRN

## 2022-01-03 NOTE — Progress Notes (Signed)
MRI Brain W/O Contrast PA Started. Pending medical review. Records faxed [phone] [place] # [phone]

## 2022-01-03 NOTE — Telephone Encounter (Signed)
Patient Advocate Encounter  Prior Authorization for Aimovig 140MG /ML auto-injectors has been approved.    PA# Effective dates: 01/03/2022 through 04/05/2022      04/07/2022, CPhT Pharmacy Patient Advocate Specialist [facility] Patient Advocate Team Direct Number: [phone]  Fax: [phone]

## 2022-01-03 NOTE — Telephone Encounter (Signed)
Patient Advocate Encounter   Received notification that prior authorization for Aimovig 140MG /ML auto-injectors is required.   PA submitted on 01/03/2022 Key [id number] Status is pending       01/05/2022, CPhT Pharmacy Patient Advocate Specialist [facility] Patient Advocate Team Direct Number: [phone]  Fax: [phone]

## 2022-01-03 NOTE — Patient Instructions (Signed)
Start Aimovig 140mg  injection every 28 days Take sumatriptan earliest onset of headache.  May repeat after 2 hours.  Maximum 2 tablets in 24 hours Stop BC and Goody powder Limit use of pain relievers to no more than 2 days out of week to prevent risk of rebound or medication-overuse headache. Check MRI of brain Check TSH Check routine EEG Follow up with PCP regarding passing out and palpitations Follow up in 4 months.

## 2022-01-04 NOTE — Progress Notes (Signed)
Patient advised of Appt time and date.

## 2022-01-04 NOTE — Progress Notes (Signed)
LMOVM appt scheduled for 01/10/22 at 8 p,. Please arrive at 7;30 pm at [facility].

## 2022-01-07 ENCOUNTER — Telehealth: Payer: Self-pay | Admitting: Neurology

## 2022-01-07 NOTE — Telephone Encounter (Signed)
Patient called and said the medication Dr. Everlena Cooper prescribed is not working, she'd like Dr. Moises Blood recommendation for something else to try.

## 2022-01-08 ENCOUNTER — Other Ambulatory Visit: Payer: Self-pay | Admitting: Neurology

## 2022-01-08 MED ORDER — RIZATRIPTAN BENZOATE 10 MG PO TABS: 10.0000 mg | ORAL_TABLET | ORAL | 5 refills | Status: DC | PRN

## 2022-01-08 NOTE — Telephone Encounter (Signed)
Pt called and I let her know of the answers per Dr. Everlena Cooper. She asked about disability and I told her to call Social Service and see what the steps are.

## 2022-01-10 ENCOUNTER — Ambulatory Visit (INDEPENDENT_AMBULATORY_CARE_PROVIDER_SITE_OTHER): Payer: Medicaid Other

## 2022-01-10 ENCOUNTER — Ambulatory Visit: Payer: Medicaid Other | Admitting: Podiatry

## 2022-01-10 ENCOUNTER — Other Ambulatory Visit: Payer: Self-pay | Admitting: Podiatry

## 2022-01-10 ENCOUNTER — Ambulatory Visit
Admission: RE | Admit: 2022-01-10 | Discharge: 2022-01-10 | Disposition: A | Discharge status: Home / Self Care | Payer: Medicaid Other | Source: Ambulatory Visit | Attending: Neurology | Admitting: Neurology

## 2022-01-10 DIAGNOSIS — Z87820 Personal history of traumatic brain injury: Secondary | ICD-10-CM | POA: Insufficient documentation

## 2022-01-10 DIAGNOSIS — M79672 Pain in left foot: Secondary | ICD-10-CM

## 2022-01-10 DIAGNOSIS — M722 Plantar fascial fibromatosis: Secondary | ICD-10-CM | POA: Diagnosis not present

## 2022-01-10 DIAGNOSIS — R413 Other amnesia: Secondary | ICD-10-CM | POA: Insufficient documentation

## 2022-01-10 DIAGNOSIS — R55 Syncope and collapse: Secondary | ICD-10-CM | POA: Diagnosis present

## 2022-01-10 DIAGNOSIS — M7672 Peroneal tendinitis, left leg: Secondary | ICD-10-CM | POA: Diagnosis not present

## 2022-01-10 MED ORDER — METHYLPREDNISOLONE 4 MG PO TBPK: ORAL_TABLET | ORAL | 0 refills | Status: DC

## 2022-01-10 MED ORDER — ACETAMINOPHEN-CODEINE 300-30 MG PO TABS: 1.0000 | ORAL_TABLET | ORAL | 0 refills | Status: DC | PRN

## 2022-01-11 NOTE — Progress Notes (Signed)
LMOVM for patient to call the office back.

## 2022-01-14 ENCOUNTER — Other Ambulatory Visit: Payer: Self-pay | Admitting: Podiatry

## 2022-01-14 ENCOUNTER — Telehealth: Payer: Self-pay | Admitting: Neurology

## 2022-01-14 ENCOUNTER — Other Ambulatory Visit (HOSPITAL_COMMUNITY): Payer: Medicaid Other

## 2022-01-14 ENCOUNTER — Ambulatory Visit (HOSPITAL_COMMUNITY)
Admission: RE | Admit: 2022-01-14 | Discharge: 2022-01-14 | Disposition: A | Discharge status: Home / Self Care | Payer: Medicaid Other | Source: Ambulatory Visit | Attending: Family Medicine | Admitting: Family Medicine

## 2022-01-14 DIAGNOSIS — R413 Other amnesia: Secondary | ICD-10-CM | POA: Diagnosis not present

## 2022-01-14 DIAGNOSIS — R55 Syncope and collapse: Secondary | ICD-10-CM | POA: Insufficient documentation

## 2022-01-14 NOTE — Progress Notes (Signed)
Patient advised of MRI results.  Patient on her way to her EEG appt.

## 2022-01-14 NOTE — Telephone Encounter (Signed)
Pt called in and left a message with the access nurse. She is returning a call about some results

## 2022-01-14 NOTE — Telephone Encounter (Signed)
Norma [name] already called and told patient results

## 2022-01-14 NOTE — Progress Notes (Signed)
EEG complete - results pending 

## 2022-01-16 ENCOUNTER — Telehealth: Payer: Self-pay | Admitting: Neurology

## 2022-01-16 ENCOUNTER — Telehealth: Payer: Self-pay | Admitting: *Deleted

## 2022-01-16 NOTE — Telephone Encounter (Signed)
Patient said she missed a call from someone.

## 2022-01-16 NOTE — Telephone Encounter (Signed)
Gave patient results no questions

## 2022-01-16 NOTE — Telephone Encounter (Signed)
Patient wants refill on pain medication

## 2022-01-16 NOTE — Procedures (Signed)
ELECTROENCEPHALOGRAM REPORT  Date of Study: 01/14/2022  Patient's Name: Norma [name] MRN: [id number] Date of Birth: 1975-12-22   Clinical History: 46 year old female with COPD, HTN, chronic pain and depression and anxiety with migraines and recurrent syncope  Medications: Tylenol #3 Clonazepam Fluoxetine Hydroxyzine Rizatriptan Lidocaine patch Aimovig amlodipine  Technical Summary: A multichannel digital EEG recording measured by the international 10-20 system with electrodes applied with paste and impedances below 5000 ohms performed in our laboratory with EKG monitoring in an awake patient.  Hyperventilation not performed due to patient having COPD. Photic stimulation was performed.  The digital EEG was referentially recorded, reformatted, and digitally filtered in a variety of bipolar and referential montages for optimal display.    Description: The patient is awake during the recording.  During maximal wakefulness, there is a symmetric, medium voltage 9 Hz posterior dominant rhythm that attenuates with eye opening.  The record is symmetric.  Stage 2 sleep was not seen.  Photic stimulation did not elicit any abnormalities.  There were no epileptiform discharges or electrographic seizures seen.    EKG lead was unremarkable.  Impression: This awake EEG is normal.    Clinical Correlation: A normal EEG does not exclude a clinical diagnosis of epilepsy.  If further clinical questions remain, prolonged EEG may be helpful.  Clinical correlation is advised.   Shon Millet, DO

## 2022-01-17 MED ORDER — ACETAMINOPHEN-CODEINE 300-30 MG PO TABS: 1.0000 | ORAL_TABLET | ORAL | 0 refills | Status: DC | PRN

## 2022-01-17 NOTE — Progress Notes (Signed)
Subjective:  Patient ID: Norma [name], female    DOB: 24-Sep-1975,  MRN: [id number]  No chief complaint on file.   46 y.o. female presents with the above complaint.  Patient presents with complaint left lateral foot pain that has been on for quite some time is progressive gotten worse worse with ambulation pain scale 7 out of 10.  Hurts with pressure.  She went to get it evaluated she has not seen anyone else prior to seeing me.  She has not tried any treatment options.  She would like to discuss treatment options for this.   Review of Systems: Negative except as noted in the HPI. Denies N/V/F/Ch.  Past Medical History:  Diagnosis Date   AKI (acute kidney injury) (HCC) 06/04/2016   Anxiety    Bacteremia 06/04/2016   Choledocholithiasis 03/01/2020   Cholelithiasis 03/02/2020   COPD (chronic obstructive pulmonary disease) (HCC)    Depression    Diarrhea 06/04/2016   History of kidney stones    Hypertension    Hypokalemia 06/04/2016   IBS (irritable bowel syndrome)    Nausea and vomiting 06/04/2016   Positive blood culture 06/04/2016   Rectal fissure 10/04/2015   Surgery, elective 09/22/2019    Current Outpatient Medications:    acetaminophen-codeine (TYLENOL #3) 300-30 MG tablet, Take 1-2 tablets by mouth every 4 (four) hours as needed for moderate pain., Disp: 30 tablet, Rfl: 0   methylPREDNISolone (MEDROL DOSEPAK) 4 MG TBPK tablet, Take as directed, Disp: 21 each, Rfl: 0   acetaminophen-codeine (TYLENOL #3) 300-30 MG tablet, Take 1-2 tablets by mouth every 4 (four) hours as needed for moderate pain., Disp: 30 tablet, Rfl: 0   ADVAIR DISKUS 250-50 MCG/ACT AEPB, Inhale 1 puff into the lungs 2 (two) times daily., Disp: , Rfl:    albuterol (PROVENTIL HFA;VENTOLIN HFA) 108 (90 BASE) MCG/ACT inhaler, Inhale 2 puffs into the lungs every 6 (six) hours as needed for wheezing., Disp: , Rfl:    amLODipine (NORVASC) 5 MG tablet, Take 5 mg by mouth daily. (Patient not taking: Reported on  01/03/2022), Disp: , Rfl:    clonazePAM (KLONOPIN) 1 MG tablet, Take 1 mg by mouth 3 (three) times daily as needed., Disp: , Rfl:    Erenumab-aooe (AIMOVIG) 140 MG/ML SOAJ, Inject 140 mg into the skin every 28 (twenty-eight) days., Disp: 1.12 mL, Rfl: 5   FLUoxetine (PROZAC) 20 MG capsule, Take 20 mg by mouth daily., Disp: , Rfl:    hydrOXYzine (ATARAX) 25 MG tablet, Take by mouth., Disp: , Rfl:    lansoprazole (PREVACID) 30 MG capsule, Take 30 mg by mouth 2 (two) times daily., Disp: , Rfl:    lidocaine (LIDODERM) 5 %, Place 1 patch onto the skin every 12 (twelve) hours. Remove & Discard patch within 12 hours or as directed by MD, Disp: 10 patch, Rfl: 0   lisinopril (ZESTRIL) 20 MG tablet, Take 20 mg by mouth daily., Disp: , Rfl:    omeprazole (PRILOSEC) 40 MG capsule, Take 1 capsule (40 mg total) by mouth 2 (two) times daily before a meal., Disp: 60 capsule, Rfl: 3   rizatriptan (MAXALT) 10 MG tablet, Take 1 tablet (10 mg total) by mouth as needed for migraine. May repeat after 2 hours if needed.  Maximum 2 tablets in 24 hours., Disp: 10 tablet, Rfl: 5   sucralfate (CARAFATE) 1 g tablet, Take 1 g by mouth 4 (four) times daily., Disp: , Rfl:    triamcinolone cream (KENALOG) 0.1 %, SMARTSIG:1 Application Topical 2-3  Times Daily, Disp: , Rfl:   Social History   Tobacco Use  Smoking Status Every Day   Packs/day: 0.50   Years: 19.00   Total pack years: 9.50   Types: Cigarettes  Smokeless Tobacco Never    Allergies  Allergen Reactions   Sulfa Antibiotics    Objective:  There were no vitals filed for this visit. There is no height or weight on file to calculate BMI. Constitutional Well developed. Well nourished.  Vascular Dorsalis pedis pulses palpable bilaterally. Posterior tibial pulses palpable bilaterally. Capillary refill normal to all digits.  No cyanosis or clubbing noted. Pedal hair growth normal.  Neurologic Normal speech. Oriented to person, place, and time. Epicritic  sensation to light touch grossly present bilaterally.  Dermatologic Nails well groomed and normal in appearance. No open wounds. No skin lesions.  Orthopedic: Pain on palpation along the course of the left lateral foot.  Pain along the peroneal tendon pain with dorsiflexion eversion of the foot resisted.  No pain with plantarflexion inversion of the foot.   Radiographs: 3 views of skeletally mature adult left foot: No bony abnormalities noted no fractures noted.  Pes planovalgus foot structure noted.  Mild midfoot arthritis noted. Assessment:   1. Peroneal tendinitis of left lower extremity    Plan:  Patient was evaluated and treated and all questions answered.  Left peroneal tendinitis -I explained the patient the etiology of tendinitis and worse treatment options were discussed.  Given the amount of pain that she is experiencing I believe she will benefit from cam boot immobilization to allow the soft tissue component to heal appropriately.  She wanted to also benefit from Medrol Dosepak and Tylenol 3 for pain control.  Patient agrees with plan to proceed with that -Cam boot was dispensed  No follow-ups on file.

## 2022-01-17 NOTE — Addendum Note (Signed)
Addended by: Nicholes Rough on: 01/17/2022 06:37 AM   Modules accepted: Orders

## 2022-01-18 ENCOUNTER — Telehealth: Payer: Self-pay | Admitting: Neurology

## 2022-01-18 MED ORDER — UBRELVY 100 MG PO TABS: 100.0000 mg | ORAL_TABLET | ORAL | 5 refills | Status: AC | PRN

## 2022-01-18 NOTE — Telephone Encounter (Signed)
Patient would like to change her migraine medicine. Getting a HA every day. 9/10 when having a bad HA. Patient LM with AN. Did not state which medication

## 2022-01-18 NOTE — Telephone Encounter (Signed)
Per Patient the Rizatrtipan not helping. Patient last seen 01/03/22. Per Patient she has to take it when she wakes up. It not every morning that she has an headache. Sometimes no NSAIDS at all.

## 2022-01-18 NOTE — Telephone Encounter (Signed)
Patient advised of Dr.Jaffe note,  Please send in prescription for Ubrelvy 100mg  - take as needed.  May repeat after 2 hours.  Maximum 2 tablets in 24 hours.  Quantity 16.  Refills 5.  This will need prior authorization (unfortunately like most medications these days).  She may come by the office to pick up samples today

## 2022-01-18 NOTE — Telephone Encounter (Signed)
Please advise 

## 2022-01-21 ENCOUNTER — Other Ambulatory Visit (HOSPITAL_COMMUNITY): Payer: Self-pay

## 2022-01-21 ENCOUNTER — Other Ambulatory Visit: Payer: Self-pay | Admitting: Podiatry

## 2022-01-21 ENCOUNTER — Telehealth (HOSPITAL_COMMUNITY): Payer: Self-pay | Admitting: Pharmacy Technician

## 2022-01-21 NOTE — Telephone Encounter (Signed)
Patient Advocate Encounter  Prior Authorization for [name] 100MG  tablets has been approved.    PA# Effective dates: 01/21/2022 through 01/22/2023      03/25/2023, CPhT Pharmacy Patient Advocate Specialist [facility] Patient Advocate Team Direct Number: [phone]  Fax: [phone]

## 2022-01-21 NOTE — Telephone Encounter (Signed)
Patient Advocate Encounter   Received notification that prior authorization for Ubrelvy 100MG  tablets is required.   PA submitted on 01/21/2022 Key [id number] Status is pending       03/24/2022, CPhT Pharmacy Patient Advocate Specialist [facility] Patient Advocate Team Direct Number: [phone]  Fax: [phone]

## 2022-02-07 ENCOUNTER — Encounter: Payer: Self-pay | Admitting: Podiatry

## 2022-02-07 ENCOUNTER — Ambulatory Visit: Payer: Medicaid Other | Admitting: Podiatry

## 2022-02-07 ENCOUNTER — Ambulatory Visit (INDEPENDENT_AMBULATORY_CARE_PROVIDER_SITE_OTHER): Payer: Medicaid Other

## 2022-02-07 DIAGNOSIS — M216X2 Other acquired deformities of left foot: Secondary | ICD-10-CM

## 2022-02-07 DIAGNOSIS — M7672 Peroneal tendinitis, left leg: Secondary | ICD-10-CM | POA: Diagnosis not present

## 2022-02-07 DIAGNOSIS — T148XXA Other injury of unspecified body region, initial encounter: Secondary | ICD-10-CM

## 2022-02-07 MED ORDER — ACETAMINOPHEN-CODEINE 300-30 MG PO TABS: 1.0000 | ORAL_TABLET | ORAL | 0 refills | Status: DC | PRN

## 2022-02-07 NOTE — Progress Notes (Signed)
Subjective:  Patient ID: Norma [name], female    DOB: 19-Apr-1976,  MRN: [id number]  No chief complaint on file.   46 y.o. female presents with the above complaint.  Patient presents for follow-up to left peroneal tendinitis.  She states that she is doing good.  The boot immobilization helped some.  She also had an acute fall this past week again when she went to the beach.  She wants to make sure that there is nothing broken.  She denies any other acute complaints.  The cam boot immobilization helped.  She would like to discuss next treatment plan.  Review of Systems: Negative except as noted in the HPI. Denies N/V/F/Ch.  Past Medical History:  Diagnosis Date   AKI (acute kidney injury) (HCC) 06/04/2016   Anxiety    Bacteremia 06/04/2016   Choledocholithiasis 03/01/2020   Cholelithiasis 03/02/2020   COPD (chronic obstructive pulmonary disease) (HCC)    Depression    Diarrhea 06/04/2016   History of kidney stones    Hypertension    Hypokalemia 06/04/2016   IBS (irritable bowel syndrome)    Nausea and vomiting 06/04/2016   Positive blood culture 06/04/2016   Rectal fissure 10/04/2015   Surgery, elective 09/22/2019    Current Outpatient Medications:    acetaminophen-codeine (TYLENOL #3) 300-30 MG tablet, Take 1-2 tablets by mouth every 4 (four) hours as needed for moderate pain., Disp: 30 tablet, Rfl: 0   acetaminophen-codeine (TYLENOL #3) 300-30 MG tablet, Take 1-2 tablets by mouth every 4 (four) hours as needed for moderate pain., Disp: 30 tablet, Rfl: 0   acetaminophen-codeine (TYLENOL #3) 300-30 MG tablet, Take 1-2 tablets by mouth every 4 (four) hours as needed for moderate pain., Disp: 30 tablet, Rfl: 0   ADVAIR DISKUS 250-50 MCG/ACT AEPB, Inhale 1 puff into the lungs 2 (two) times daily., Disp: , Rfl:    albuterol (PROVENTIL HFA;VENTOLIN HFA) 108 (90 BASE) MCG/ACT inhaler, Inhale 2 puffs into the lungs every 6 (six) hours as needed for wheezing., Disp: , Rfl:     amLODipine (NORVASC) 5 MG tablet, Take 5 mg by mouth daily. (Patient not taking: Reported on 01/03/2022), Disp: , Rfl:    clonazePAM (KLONOPIN) 1 MG tablet, Take 1 mg by mouth 3 (three) times daily as needed., Disp: , Rfl:    Erenumab-aooe (AIMOVIG) 140 MG/ML SOAJ, Inject 140 mg into the skin every 28 (twenty-eight) days., Disp: 1.12 mL, Rfl: 5   FLUoxetine (PROZAC) 20 MG capsule, Take 20 mg by mouth daily., Disp: , Rfl:    hydrOXYzine (ATARAX) 25 MG tablet, Take by mouth., Disp: , Rfl:    lansoprazole (PREVACID) 30 MG capsule, Take 30 mg by mouth 2 (two) times daily., Disp: , Rfl:    lidocaine (LIDODERM) 5 %, Place 1 patch onto the skin every 12 (twelve) hours. Remove & Discard patch within 12 hours or as directed by MD, Disp: 10 patch, Rfl: 0   lisinopril (ZESTRIL) 20 MG tablet, Take 20 mg by mouth daily., Disp: , Rfl:    methylPREDNISolone (MEDROL DOSEPAK) 4 MG TBPK tablet, Take as directed, Disp: 21 each, Rfl: 0   omeprazole (PRILOSEC) 40 MG capsule, Take 1 capsule (40 mg total) by mouth 2 (two) times daily before a meal., Disp: 60 capsule, Rfl: 3   rizatriptan (MAXALT) 10 MG tablet, Take 1 tablet (10 mg total) by mouth as needed for migraine. May repeat after 2 hours if needed.  Maximum 2 tablets in 24 hours., Disp: 10 tablet, Rfl: 5  sucralfate (CARAFATE) 1 g tablet, Take 1 g by mouth 4 (four) times daily., Disp: , Rfl:    triamcinolone cream (KENALOG) 0.1 %, SMARTSIG:1 Application Topical 2-3 Times Daily, Disp: , Rfl:    Ubrogepant (UBRELVY) 100 MG TABS, Take 100 mg by mouth as needed (take at the earlist onset of Migraine. May repeat after 2 hours.  Maximum 2 tablets in 24 hours.)., Disp: 16 tablet, Rfl: 5  Social History   Tobacco Use  Smoking Status Every Day   Packs/day: 0.50   Years: 19.00   Total pack years: 9.50   Types: Cigarettes  Smokeless Tobacco Never    Allergies  Allergen Reactions   Sulfa Antibiotics    Objective:  There were no vitals filed for this visit. There  is no height or weight on file to calculate BMI. Constitutional Well developed. Well nourished.  Vascular Dorsalis pedis pulses palpable bilaterally. Posterior tibial pulses palpable bilaterally. Capillary refill normal to all digits.  No cyanosis or clubbing noted. Pedal hair growth normal.  Neurologic Normal speech. Oriented to person, place, and time. Epicritic sensation to light touch grossly present bilaterally.  Dermatologic Nails well groomed and normal in appearance. No open wounds. No skin lesions.  Orthopedic: Pain on palpation along the course of the left lateral foot.  Pain along the peroneal tendon pain with dorsiflexion eversion of the foot resisted.  No pain with plantarflexion inversion of the foot.   Radiographs: 3 views of skeletally mature adult left foot: No bony abnormalities noted no fractures noted.  Pes planovalgus foot structure noted.  Mild midfoot arthritis noted.  No acute break noted. Assessment:   1. Peroneal tendinitis of left lower extremity   2. Contusion of soft tissue    Plan:  Patient was evaluated and treated and all questions answered.  Mild soft tissue contusion-I explained the patient the etiology of contusion versus treatment options were discussed.  This may have happened due to the fall.  Again she will benefit from transitioning into the Tri-Lock ankle brace.  Her pain is mostly at the peroneal tendon -  Left peroneal tendinitis -I explained the patient the etiology of tendinitis and worse treatment options were discussed.   -Clinically at this time patient can begin transition from cam boot into a Tri-Lock ankle brace.  Tri-Lock ankle brace was dispensed.  She will also benefit from steroid injection of decrease inflammatory component associate with pain.  Patient agrees with plan like to proceed with steroid injection.  No follow-ups on file.

## 2022-02-11 ENCOUNTER — Telehealth: Payer: Self-pay | Admitting: *Deleted

## 2022-02-11 MED ORDER — ACETAMINOPHEN-CODEINE 300-30 MG PO TABS: 1.0000 | ORAL_TABLET | ORAL | 0 refills | Status: DC | PRN

## 2022-02-11 NOTE — Telephone Encounter (Signed)
Patient is calling for a pain medicine refill of (tylenol #[id number]) ,left foot is still hurting. Please advise.

## 2022-02-12 ENCOUNTER — Telehealth: Payer: Self-pay | Admitting: *Deleted

## 2022-02-12 ENCOUNTER — Other Ambulatory Visit: Payer: Self-pay | Admitting: Podiatry

## 2022-02-12 MED ORDER — OXYCODONE-ACETAMINOPHEN 5-325 MG PO TABS
1.0000 | ORAL_TABLET | ORAL | 0 refills | Status: DC | PRN
Start: 2022-02-12 — End: 2022-08-28

## 2022-02-12 NOTE — Telephone Encounter (Signed)
Patient is requesting something stronger for pain,current pain medicine not helping.Please advise

## 2022-03-05 ENCOUNTER — Other Ambulatory Visit (HOSPITAL_COMMUNITY): Payer: Self-pay

## 2022-03-05 ENCOUNTER — Telehealth (HOSPITAL_COMMUNITY): Payer: Self-pay | Admitting: Pharmacy Technician

## 2022-03-05 NOTE — Telephone Encounter (Signed)
Patient Advocate Encounter  Prior Authorization for Ubrelvy 100 mg tablets  has been approved.    PA# [id number] Effective dates: 03/05/2022 through 03/05/2023      Roland Earl, CPhT Pharmacy Patient Advocate Specialist [facility] Patient Advocate Team Direct Number: [phone]  Fax: [phone]

## 2022-03-05 NOTE — Telephone Encounter (Signed)
Patient Advocate Encounter   Received notification that prior authorization for Ubrelvy 100 mg tablets is required.   PA submitted on 03/05/2022 Confirmation #[id number] [address] [place] Tr[employer] Status is pending       Roland Earl, CPhT Pharmacy Patient Advocate Specialist [facility] Patient Advocate Team Direct Number: [phone]  Fax: [phone]

## 2022-03-13 ENCOUNTER — Telehealth (HOSPITAL_COMMUNITY): Payer: Self-pay | Admitting: Pharmacy Technician

## 2022-03-13 ENCOUNTER — Other Ambulatory Visit (HOSPITAL_COMMUNITY): Payer: Self-pay

## 2022-03-13 NOTE — Telephone Encounter (Signed)
Patient Advocate Encounter  Prior Authorization for Aimovig 140MG /ML auto-injectors has been approved.    PA# Conf#  [id number] W Effective dates: 03/13/2022 through 03/13/2023      03/15/2023, CPhT Pharmacy Patient Advocate Specialist [facility] Patient Advocate Team Direct Number: [phone]  Fax: [phone]

## 2022-05-15 ENCOUNTER — Ambulatory Visit: Payer: Medicaid Other | Admitting: Neurology

## 2022-07-17 ENCOUNTER — Telehealth: Payer: Self-pay

## 2022-07-17 NOTE — Telephone Encounter (Signed)
Gastroenterology Pre-Procedure Review  Request Date: TBD Requesting Physician: Dr. Marius Ditch  PATIENT REVIEW QUESTIONS: The patient responded to the following health history questions as indicated:    1. Are you having any GI issues? no 2. Do you have a personal history of Polyps? Yes last colonoscopy performed by Dr. Marius Ditch on 09/05/21 Report noted:Repeat colonoscopy in 5 years for surveillance given family h/o colon cancer. 3. Do you have a family history of Colon Cancer or Polyps? no 4. Diabetes Mellitus? no 5. Joint replacements in the past 12 months?no 6. Major health problems in the past 3 months?no 7. Any artificial heart valves, MVP, or defibrillator?no    MEDICATIONS & ALLERGIES:    Patient reports the following regarding taking any anticoagulation/antiplatelet therapy:   Plavix, Coumadin, Eliquis, Xarelto, Lovenox, Pradaxa, Brilinta, or Effient? no Aspirin? no  Patient confirms/reports the following medications:  Current Outpatient Medications  Medication Sig Dispense Refill   acetaminophen-codeine (TYLENOL #3) 300-30 MG tablet Take 1-2 tablets by mouth every 4 (four) hours as needed for moderate pain. 30 tablet 0   acetaminophen-codeine (TYLENOL #3) 300-30 MG tablet Take 1-2 tablets by mouth every 4 (four) hours as needed for moderate pain. 30 tablet 0   acetaminophen-codeine (TYLENOL #3) 300-30 MG tablet Take 1-2 tablets by mouth every 4 (four) hours as needed for moderate pain. 30 tablet 0   acetaminophen-codeine (TYLENOL #3) 300-30 MG tablet Take 1-2 tablets by mouth every 4 (four) hours as needed for moderate pain. 30 tablet 0   ADVAIR DISKUS 250-50 MCG/ACT AEPB Inhale 1 puff into the lungs 2 (two) times daily.     albuterol (PROVENTIL HFA;VENTOLIN HFA) 108 (90 BASE) MCG/ACT inhaler Inhale 2 puffs into the lungs every 6 (six) hours as needed for wheezing.     amLODipine (NORVASC) 5 MG tablet Take 5 mg by mouth daily. (Patient not taking: Reported on 01/03/2022)     clonazePAM  (KLONOPIN) 1 MG tablet Take 1 mg by mouth 3 (three) times daily as needed.     Erenumab-aooe (AIMOVIG) 140 MG/ML SOAJ Inject 140 mg into the skin every 28 (twenty-eight) days. 1.12 mL 5   FLUoxetine (PROZAC) 20 MG capsule Take 20 mg by mouth daily.     hydrOXYzine (ATARAX) 25 MG tablet Take by mouth.     lansoprazole (PREVACID) 30 MG capsule Take 30 mg by mouth 2 (two) times daily.     lidocaine (LIDODERM) 5 % Place 1 patch onto the skin every 12 (twelve) hours. Remove & Discard patch within 12 hours or as directed by MD 10 patch 0   lisinopril (ZESTRIL) 20 MG tablet Take 20 mg by mouth daily.     methylPREDNISolone (MEDROL DOSEPAK) 4 MG TBPK tablet Take as directed 21 each 0   omeprazole (PRILOSEC) 40 MG capsule Take 1 capsule (40 mg total) by mouth 2 (two) times daily before a meal. 60 capsule 3   oxyCODONE-acetaminophen (PERCOCET) 5-325 MG tablet Take 1 tablet by mouth every 4 (four) hours as needed for severe pain. 30 tablet 0   rizatriptan (MAXALT) 10 MG tablet Take 1 tablet (10 mg total) by mouth as needed for migraine. May repeat after 2 hours if needed.  Maximum 2 tablets in 24 hours. 10 tablet 5   sucralfate (CARAFATE) 1 g tablet Take 1 g by mouth 4 (four) times daily.     triamcinolone cream (KENALOG) 0.1 % SMARTSIG:1 Application Topical 2-3 Times Daily     No current facility-administered medications for this visit.  Patient confirms/reports the following allergies:  Allergies  Allergen Reactions   Sulfa Antibiotics     No orders of the defined types were placed in this encounter.   AUTHORIZATION INFORMATION Primary Insurance: 1D#: Group #:  Secondary Insurance: 1D#: Group #:  SCHEDULE INFORMATION: Date:  Time: Location:

## 2022-07-18 NOTE — Telephone Encounter (Signed)
Patient has been advised she is not due for her colonoscopy until 5 years.  Thanks,  [place], [place]

## 2022-07-18 NOTE — Telephone Encounter (Signed)
She is not due for 5 years for her next colonoscopy  [facility]

## 2022-07-24 ENCOUNTER — Other Ambulatory Visit: Payer: Self-pay | Admitting: Neurology

## 2022-08-01 ENCOUNTER — Ambulatory Visit: Payer: Medicaid Other | Admitting: Urology

## 2022-08-28 ENCOUNTER — Ambulatory Visit (INDEPENDENT_AMBULATORY_CARE_PROVIDER_SITE_OTHER): Payer: Medicaid Other | Admitting: Urology

## 2022-08-28 VITALS — BP 128/85 | HR 96 | Ht 62.0 in | Wt 178.4 lb

## 2022-08-28 DIAGNOSIS — N390 Urinary tract infection, site not specified: Secondary | ICD-10-CM

## 2022-08-28 DIAGNOSIS — M545 Low back pain, unspecified: Secondary | ICD-10-CM

## 2022-08-28 DIAGNOSIS — Z87440 Personal history of urinary (tract) infections: Secondary | ICD-10-CM

## 2022-08-28 DIAGNOSIS — R3915 Urgency of urination: Secondary | ICD-10-CM

## 2022-08-28 DIAGNOSIS — Z87891 Personal history of nicotine dependence: Secondary | ICD-10-CM

## 2022-08-28 DIAGNOSIS — R3129 Other microscopic hematuria: Secondary | ICD-10-CM

## 2022-08-28 DIAGNOSIS — G8929 Other chronic pain: Secondary | ICD-10-CM | POA: Diagnosis not present

## 2022-08-28 DIAGNOSIS — R109 Unspecified abdominal pain: Secondary | ICD-10-CM

## 2022-08-28 DIAGNOSIS — R350 Frequency of micturition: Secondary | ICD-10-CM

## 2022-08-28 DIAGNOSIS — N200 Calculus of kidney: Secondary | ICD-10-CM

## 2022-08-28 NOTE — Patient Instructions (Addendum)
For UTI prevention take cranberry tablets, probiotic, D-Mannose daily.   Cystoscopy Cystoscopy is a procedure that is used to help diagnose and sometimes treat conditions that affect the lower urinary tract. The lower urinary tract includes the bladder and the urethra. The urethra is the tube that drains urine from the bladder. Cystoscopy is done using a thin, tube-shaped instrument with a light and camera at the end (cystoscope). The cystoscope may be hard or flexible, depending on the goal of the procedure. The cystoscope is inserted through the urethra, into the bladder. Cystoscopy may be recommended if you have: Urinary tract infections that keep coming back. Blood in the urine (hematuria). An inability to control when you urinate (urinary incontinence) or an overactive bladder. Unusual cells found in a urine sample. A blockage in the urethra, such as a urinary stone. Painful urination. An abnormality in the bladder found during an intravenous pyelogram (IVP) or CT scan. What are the risks? Generally, this is a safe procedure. However, problems may occur, including: Infection. Bleeding.  What happens during the procedure?  You will be given one or more of the following: A medicine to numb the area (local anesthetic). The area around the opening of your urethra will be cleaned. The cystoscope will be passed through your urethra into your bladder. Germ-free (sterile) fluid will flow through the cystoscope to fill your bladder. The fluid will stretch your bladder so that your health care provider can clearly examine your bladder walls. Your doctor will look at the urethra and bladder. The cystoscope will be removed The procedure may vary among health care providers  What can I expect after the procedure? After the procedure, it is common to have: Some soreness or pain in your urethra. Urinary symptoms. These include: Mild pain or burning when you urinate. Pain should stop within a few  minutes after you urinate. This may last for up to a few days after the procedure. A small amount of blood in your urine for several days. Feeling like you need to urinate but producing only a small amount of urine. Follow these instructions at home: General instructions Return to your normal activities as told by your health care provider.  Drink plenty of fluids after the procedure. Keep all follow-up visits as told by your health care provider. This is important. Contact a health care provider if you: Have pain that gets worse or does not get better with medicine, especially pain when you urinate lasting longer than 72 hours after the procedure. Have trouble urinating. Get help right away if you: Have blood clots in your urine. Have a fever or chills. Are unable to urinate. Summary Cystoscopy is a procedure that is used to help diagnose and sometimes treat conditions that affect the lower urinary tract. Cystoscopy is done using a thin, tube-shaped instrument with a light and camera at the end. After the procedure, it is common to have some soreness or pain in your urethra. It is normal to have blood in your urine after the procedure.  If you were prescribed an antibiotic medicine, take it as told by your health care provider.  This information is not intended to replace advice given to you by your health care provider. Make sure you discuss any questions you have with your health care provider. Document Revised: 06/23/2018 Document Reviewed: 06/23/2018 Elsevier Patient Education  [place].
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
## 2022-08-29 LAB — URINALYSIS, COMPLETE
Bilirubin, UA: NEGATIVE
Glucose, UA: NEGATIVE
Ketones, UA: NEGATIVE
Nitrite, UA: NEGATIVE
Specific Gravity, UA: 1.025 (ref 1.005–1.030)
Urobilinogen, Ur: 0.2 mg/dL (ref 0.2–1.0)
pH, UA: 5.5 (ref 5.0–7.5)

## 2022-08-29 LAB — MICROSCOPIC EXAMINATION

## 2022-09-03 LAB — CULTURE, URINE COMPREHENSIVE

## 2022-09-06 ENCOUNTER — Ambulatory Visit: Payer: Medicaid Other | Admitting: Podiatry

## 2022-09-06 ENCOUNTER — Ambulatory Visit
Admission: RE | Admit: 2022-09-06 | Discharge: 2022-09-06 | Disposition: A | Discharge status: Home / Self Care | Payer: Medicaid Other | Source: Ambulatory Visit | Attending: Urology | Admitting: Urology

## 2022-09-06 ENCOUNTER — Encounter: Payer: Self-pay | Admitting: Podiatry

## 2022-09-06 ENCOUNTER — Telehealth: Payer: Self-pay | Admitting: *Deleted

## 2022-09-06 DIAGNOSIS — M7751 Other enthesopathy of right foot: Secondary | ICD-10-CM

## 2022-09-06 DIAGNOSIS — R3129 Other microscopic hematuria: Secondary | ICD-10-CM | POA: Insufficient documentation

## 2022-09-06 DIAGNOSIS — N390 Urinary tract infection, site not specified: Secondary | ICD-10-CM | POA: Insufficient documentation

## 2022-09-06 DIAGNOSIS — R350 Frequency of micturition: Secondary | ICD-10-CM | POA: Insufficient documentation

## 2022-09-06 DIAGNOSIS — Z87891 Personal history of nicotine dependence: Secondary | ICD-10-CM | POA: Diagnosis present

## 2022-09-06 DIAGNOSIS — M545 Low back pain, unspecified: Secondary | ICD-10-CM | POA: Diagnosis present

## 2022-09-06 DIAGNOSIS — M2041 Other hammer toe(s) (acquired), right foot: Secondary | ICD-10-CM

## 2022-09-06 DIAGNOSIS — G8929 Other chronic pain: Secondary | ICD-10-CM | POA: Insufficient documentation

## 2022-09-06 MED ORDER — IOHEXOL 300 MG/ML  SOLN
125.0000 mL | Freq: Once | INTRAMUSCULAR | Status: AC | PRN
  Administered 2022-09-06: 125 mL via INTRAVENOUS

## 2022-09-06 MED ORDER — OXYCODONE-ACETAMINOPHEN 5-325 MG PO TABS: 1.0000 | ORAL_TABLET | ORAL | 0 refills | Status: DC | PRN

## 2022-09-06 NOTE — Telephone Encounter (Signed)
Patient is asking that the oxycodone-ace be resent to the [facility] in [place]), other pharmacy had not receive, something is going on with their fax, please advise.

## 2022-09-06 NOTE — Progress Notes (Signed)
Subjective:  Patient ID: Norma [name], female    DOB: Jan 28, 1976,  MRN: [id number]  Chief Complaint  Patient presents with   Toe Pain    47 y.o. female presents with the above complaint.  Patient presents with new complaint of right second digit hammertoe contracture.  Patient states it is painful to touch has been hurting for quite some time when to get it evaluated she does have dogs that may have stepped on it as well.  She states that this person is sore to touch she works a lot on her foot she wears new balance shoes she denies seeing anyone else prior to seeing me.  Pain scale 7 out of 10 hurts with ambulation worse with pressure   Review of Systems: Negative except as noted in the HPI. Denies N/V/F/Ch.  Past Medical History:  Diagnosis Date   AKI (acute kidney injury) ([facility]) 06/04/2016   Anxiety    Bacteremia 06/04/2016   Choledocholithiasis 03/01/2020   Cholelithiasis 03/02/2020   COPD (chronic obstructive pulmonary disease) ([place])    Depression    Diarrhea 06/04/2016   History of kidney stones    Hypertension    Hypokalemia 06/04/2016   IBS (irritable bowel syndrome)    Nausea and vomiting 06/04/2016   Positive blood culture 06/04/2016   Rectal fissure 10/04/2015   Surgery, elective 09/22/2019    Current Outpatient Medications:    oxyCODONE-acetaminophen (PERCOCET) 5-325 MG tablet, Take 1 tablet by mouth every 4 (four) hours as needed for severe pain., Disp: 30 tablet, Rfl: 0   ADVAIR DISKUS 250-50 MCG/ACT AEPB, Inhale 1 puff into the lungs 2 (two) times daily., Disp: , Rfl:    albuterol (PROVENTIL HFA;VENTOLIN HFA) 108 (90 BASE) MCG/ACT inhaler, Inhale 2 puffs into the lungs every 6 (six) hours as needed for wheezing., Disp: , Rfl:    amLODipine (NORVASC) 5 MG tablet, Take 5 mg by mouth daily. (Patient not taking: Reported on 01/03/2022), Disp: , Rfl:    ARIPiprazole (ABILIFY) 2 MG tablet, Take 2 mg by mouth daily., Disp: , Rfl:    clonazePAM (KLONOPIN) 1 MG  tablet, Take 1 mg by mouth 3 (three) times daily as needed., Disp: , Rfl:    FLUoxetine (PROZAC) 20 MG capsule, Take 20 mg by mouth daily., Disp: , Rfl:    lansoprazole (PREVACID) 30 MG capsule, Take 30 mg by mouth 2 (two) times daily., Disp: , Rfl:    lisinopril (ZESTRIL) 20 MG tablet, Take 20 mg by mouth daily., Disp: , Rfl:    UBRELVY 100 MG TABS, Take by mouth., Disp: , Rfl:   Social History   Tobacco Use  Smoking Status Every Day   Packs/day: 0.50   Years: 19.00   Total pack years: 9.50   Types: Cigarettes  Smokeless Tobacco Never    Allergies  Allergen Reactions   Sulfa Antibiotics    Objective:  There were no vitals filed for this visit. There is no height or weight on file to calculate BMI. Constitutional Well developed. Well nourished.  Vascular Dorsalis pedis pulses palpable bilaterally. Posterior tibial pulses palpable bilaterally. Capillary refill normal to all digits.  No cyanosis or clubbing noted. Pedal hair growth normal.  Neurologic Normal speech. Oriented to person, place, and time. Epicritic sensation to light touch grossly present bilaterally.  Dermatologic Nails well groomed and normal in appearance. No open wounds. No skin lesions.  Orthopedic: Pain on palpation of right second digit hammertoe contracture noted semiflexible deformity noted.  Capsulitis noted at the  PIPJ joint of the right second toe pain on palpation   Radiographs: None Assessment:   1. Hammertoe of second toe of right foot   2. Capsulitis of toe, right    Plan:  Patient was evaluated and treated and all questions answered.  Right second PIPJ joint capsulitis with underlying hammertoe contracture -All questions and concerns were discussed with the patient in extensive detail -Given the amount of pain that she is experiencing she will benefit from a steroid injection to help decrease acute inflammatory component associate with pain.  Patient agrees with plan like to proceed with  steroid injection -A steroid injection was performed at right PIPJ joint using 1% plain Lidocaine and 10 mg of Kenalog. This was well tolerated. -I discussed shoe gear modification and toe protectors were dispensed   No follow-ups on file.

## 2022-09-17 ENCOUNTER — Other Ambulatory Visit: Payer: Self-pay

## 2022-09-17 ENCOUNTER — Telehealth: Payer: Self-pay | Admitting: Podiatry

## 2022-09-17 MED ORDER — OXYCODONE-ACETAMINOPHEN 5-325 MG PO TABS: 1.0000 | ORAL_TABLET | ORAL | 0 refills | Status: DC | PRN

## 2022-09-17 NOTE — Telephone Encounter (Signed)
Pt requested a Rx refill on her pain medicine. She uses the pharmacy at [address], [place]. Please advise.

## 2022-09-18 ENCOUNTER — Other Ambulatory Visit: Payer: Self-pay | Admitting: Podiatry

## 2022-09-18 ENCOUNTER — Ambulatory Visit (INDEPENDENT_AMBULATORY_CARE_PROVIDER_SITE_OTHER): Payer: Medicaid Other | Admitting: Urology

## 2022-09-18 DIAGNOSIS — R911 Solitary pulmonary nodule: Secondary | ICD-10-CM

## 2022-09-18 DIAGNOSIS — R3129 Other microscopic hematuria: Secondary | ICD-10-CM

## 2022-09-18 DIAGNOSIS — Z01818 Encounter for other preprocedural examination: Secondary | ICD-10-CM

## 2022-09-18 DIAGNOSIS — N200 Calculus of kidney: Secondary | ICD-10-CM

## 2022-09-18 LAB — URINALYSIS, COMPLETE
Bilirubin, UA: NEGATIVE
Glucose, UA: NEGATIVE
Ketones, UA: NEGATIVE
Nitrite, UA: NEGATIVE
Specific Gravity, UA: 1.025 (ref 1.005–1.030)
Urobilinogen, Ur: 2 mg/dL — ABNORMAL HIGH (ref 0.2–1.0)
pH, UA: 5.5 (ref 5.0–7.5)

## 2022-09-18 LAB — MICROSCOPIC EXAMINATION: WBC, UA: >30 /hpf — AB (ref 0–5)

## 2022-09-18 MED ORDER — OXYCODONE-ACETAMINOPHEN 5-325 MG PO TABS: 1.0000 | ORAL_TABLET | ORAL | 0 refills | Status: DC | PRN

## 2022-09-18 NOTE — Telephone Encounter (Signed)
Pt was calling about her Rx. I saw that it was sent  09/17/22 however, I didn't see where it was sent to.   Please advise

## 2022-09-18 NOTE — Patient Instructions (Signed)
Ureteroscopy  Ureteroscopy is a procedure to check for and treat problems inside part of the urinary tract. In this procedure, a long rigid or flexible tube with a lens and light at the end (ureteroscope) is used to look at the inside of the kidneys and the ureters. The ureters are the tubes that carry urine from the kidneys to the bladder. The ureteroscope is inserted into one or both of the ureters. You may need this procedure if you have frequent urinary tract infections (UTIs), blood in your urine, or a stone in one or both of your ureters. A ureteroscopy can be done: To find the cause of urine blockage in a ureter and to evaluate other abnormalities inside the ureters or kidneys. To remove stones. To remove or treat growths of tissue (polyps), abnormal tissue, and some types of tumors. To remove a tissue sample and check it for disease under a microscope (biopsy). Tell a health care provider about: Any allergies you have. All medicines you are taking, including vitamins, herbs, eye drops, creams, and over-the-counter medicines. Any problems you or family members have had with anesthetic medicines. Any bleeding problems you have. Any surgeries you have had. Any medical conditions you have. Whether you are pregnant or may be pregnant. What are the risks? Your health care provider will talk with you about risks. These may include: Abdominal pain or a burning feeling or pain while urinating. Abnormal bleeding. A UTI. Allergic reactions to medicines. Scarring that narrows the ureter (stricture) or swelling. Creating a hole (perforation) in the ureter. Damage to other structures or organs, such as the part of your body that drains urine from your bladder (urethra), your bladder, or your uterus. What happens before the procedure? When to stop eating and drinking  8 hours before your procedure Stop eating most foods. Do not eat meat, fried foods, or fatty foods. Eat only light foods, such  as toast or crackers. All liquids are okay except energy drinks and alcohol. 6 hours before your procedure Stop eating. Drink only clear liquids, such as water, clear fruit juice, black coffee, plain tea, and sports drinks. Do not drink energy drinks or alcohol. 2 hours before your procedure Stop drinking all liquids. You may be allowed to take medicines with small sips of water. Medicines Ask your health care provider about: Changing or stopping your regular medicines. These include any diabetes medicines or blood thinners you take. Taking medicines such as aspirin and ibuprofen. These medicines can thin your blood. Do not take these medicines unless your health care provider tells you to. Taking over-the-counter medicines, vitamins, herbs, and supplements. General instructions Do not use any products that contain nicotine or tobacco for at least 4 weeks before the procedure. These products include cigarettes, chewing tobacco, and vaping devices, such as e-cigarettes. If you need help quitting, ask your health care provider. If you will be going home right after the procedure, plan to have a responsible adult: Take you home from the hospital or clinic. You will not be allowed to drive. Care for you for the time you are told. Ask your health care provider what steps will be taken to help prevent infection. These may include: Washing skin with a soap that kills germs. Receiving antibiotic medicine. Tests You may have an exam or testing. You may have a urine sample taken to check for infection. What happens during the procedure? An IV will be inserted into one of your veins. You may be given: A sedative. This helps  you relax. Anesthesia. This will: Numb certain areas of your body. Make you fall asleep for surgery. Your urethra will be cleaned with a germ-killing solution. The ureteroscope will be passed through your urethra into your bladder. A salt-water solution will be sent through  the ureteroscope to fill your bladder. This will help the health care provider see the openings of your ureters more clearly. The ureteroscope will be passed into your ureter. If a growth is found, a biopsy may be done. If a stone is found, it may be removed through the ureteroscope, or the stone may be broken up using a laser, shock waves, or electrical energy. In some cases, if the ureter is too small, a tube may be inserted that keeps the ureter open (ureteral stent). The stent may be left in place for 1 or 2 weeks, and then the ureteroscopy procedure will be done again. The scope will be removed, and your bladder will be emptied. The procedure may vary among health care providers and hospitals. What happens after the procedure? Your blood pressure, heart rate, breathing rate, and blood oxygen level will be monitored until you leave the hospital or clinic. It is up to you to get the results of your procedure. Ask your health care provider, or the department that is doing the procedure, when your results will be ready. Summary Ureteroscopy is a procedure used to look at the inside of the kidneys and the ureters. You may need this procedure if you have frequent urinary tract infections (UTIs), blood in your urine, or a stone in one or both of your ureters. Follow instructions from your health care provider about eating and drinking. In some cases, if the ureter is too small, a tube may be inserted that keeps the ureter open (ureteral stent). The stent may be left in place for 1 or 2 weeks to keep the ureter open, and then the ureteroscopy procedure will be done again. This information is not intended to replace advice given to you by your health care provider. Make sure you discuss any questions you have with your health care provider. Document Revised: 10/26/2021 Document Reviewed: 10/26/2021 Elsevier Patient Education  [place].

## 2022-09-18 NOTE — H&P (View-Only) (Signed)
   09/18/22  CC:  Chief Complaint  Patient presents with   Cysto    HPI: 47-year-old smoker who presents today for cystoscopy for further evaluation of microscopic hematuria.  She underwent CT urogram she did indicate an incidental 12 mm new right posterior lower lobe pulmonary nodule.  She also has a 6 mm right midpole calculus as well as a 3 mm left upper pole calculus but no obstructing stones.  No other GU pathology identified other than some mild right parenchymal scarring.  I personally reviewed the scan and does agree with radiologic interpretation, right lower pole stone measures 9 mm.  This is stable from previous scans.  Last menstrual period 09/24/2015. NED. A&Ox3.   No respiratory distress   Abd soft, NT, ND Normal external genitalia with patent urethral meatus  Cystoscopy Procedure Note  Patient identification was confirmed, informed consent was obtained, and patient was prepped using Betadine solution.  Lidocaine jelly was administered per urethral meatus.    Procedure: - Flexible cystoscope introduced, without any difficulty.   - Thorough search of the bladder revealed:    normal urethral meatus    normal urothelium    no stones    no ulcers     no tumors    no urethral polyps    no trabeculation  - Ureteral orifices were normal in position and appearance.  Post-Procedure: - Patient tolerated the procedure well  Assessment/ Plan:    No follow-ups on file.  Nemesis Rainwater, MD 

## 2022-09-18 NOTE — Telephone Encounter (Signed)
Patient is calling for the status of a medication that was supposed to be sent to pharmacy,did not receive, please advise or resend to pharmacy on file.

## 2022-09-18 NOTE — Progress Notes (Unsigned)
   09/18/22  CC:  Chief Complaint  Patient presents with   Cysto    HPI: 47 year old smoker who presents today for cystoscopy for further evaluation of microscopic hematuria.  She underwent CT urogram she did indicate an incidental 12 mm new right posterior lower lobe pulmonary nodule.  She also has a 6 mm right midpole calculus as well as a 3 mm left upper pole calculus but no obstructing stones.  No other GU pathology identified other than some mild right parenchymal scarring.  I personally reviewed the scan and does agree with radiologic interpretation, right lower pole stone measures 9 mm.  This is stable from previous scans.  Last menstrual period 09/24/2015. NED. A&Ox3.   No respiratory distress   Abd soft, NT, ND Normal external genitalia with patent urethral meatus  Cystoscopy Procedure Note  Patient identification was confirmed, informed consent was obtained, and patient was prepped using Betadine solution.  Lidocaine jelly was administered per urethral meatus.    Procedure: - Flexible cystoscope introduced, without any difficulty.   - Thorough search of the bladder revealed:    normal urethral meatus    normal urothelium    no stones    no ulcers     no tumors    no urethral polyps    no trabeculation  - Ureteral orifices were normal in position and appearance.  Post-Procedure: - Patient tolerated the procedure well  Assessment/ Plan:    No follow-ups on file.  Vanna Scotland, MD

## 2022-09-19 ENCOUNTER — Telehealth: Payer: Self-pay

## 2022-09-19 ENCOUNTER — Other Ambulatory Visit: Payer: Self-pay | Admitting: Urology

## 2022-09-19 DIAGNOSIS — R3129 Other microscopic hematuria: Secondary | ICD-10-CM

## 2022-09-19 DIAGNOSIS — N200 Calculus of kidney: Secondary | ICD-10-CM

## 2022-09-19 MED ORDER — OXYCODONE-ACETAMINOPHEN 5-325 MG PO TABS: 1.0000 | ORAL_TABLET | ORAL | 0 refills | Status: DC | PRN

## 2022-09-19 NOTE — Progress Notes (Signed)
   [facility] From  Surgery Date: Date: 09/30/2022  Surgeon: Dr. Hollice Espy, MD  Inpt ( No  )   Outpt (Yes)   Obs ( No  )   Diagnosis: N20.0 Right Nephrolithiasis, R31.29 Microscopic Hematuria  -CPT: [id number], [id number]  Surgery: Right Ureteroscopy with Laser Lithotripsy and Stent Placement and Bladder Biopsy  Stop Anticoagulations: Yes, may continue ASA  Cardiac/Medical/Pulmonary Clearance needed: no  *Orders entered into EPIC  Date: 09/19/22   *Case booked in EPIC  Date: 09/19/22  *Notified pt of Surgery: Date: 09/19/22  PRE-OP UA & CX: no  *Placed into Prior Authorization Work Que Date: 09/19/22  Assistant/laser/rep:No

## 2022-09-19 NOTE — Telephone Encounter (Signed)
Duplicates of the oxycodone was sent per pharmacy([facility]), will cancel  one, patient has picked up prescription already.

## 2022-09-19 NOTE — Progress Notes (Signed)
Surgical Physician Order Form [facility] [place]  * Scheduling expectation : Next Available  *Length of Case:   *Clearance needed: no  *Anticoagulation Instructions: Hold all anticoagulants  *Aspirin Instructions: Ok to continue Aspirin  *Post-op visit Date/Instructions:   TBD  *Diagnosis: Right Nephrolithiasis; bladder erythema; microscopic hematuria  *Procedure: right Ureteroscopy w/laser lithotripsy & stent placement [id number]); bladder biopsy   Additional orders: N/A  -Admit type: OUTpatient  -Anesthesia: General  -VTE Prophylaxis Standing Order SCD's       Other:   -Standing Lab Orders Per Anesthesia    Lab other: None  -Standing Test orders EKG/Chest x-ray per Anesthesia       Test other:   - Medications:  Ancef 2gm IV  -Other orders:  N/A

## 2022-09-19 NOTE — Telephone Encounter (Signed)
I spoke with Norma [name]. We have discussed possible surgery dates and Monday March 18th, 2024 was agreed upon by all parties. Patient given information about surgery date, what to expect pre-operatively and post operatively.  We discussed that a Pre-Admission Testing office will be calling to set up the pre-op visit that will take place prior to surgery, and that these appointments are typically done over the phone with a Pre-Admissions RN. Informed patient that our office will communicate any additional care to be provided after surgery. Patients questions or concerns were discussed during our call. Advised to call our office should there be any additional information, questions or concerns that arise. Patient verbalized understanding.

## 2022-09-23 ENCOUNTER — Telehealth: Payer: Self-pay | Admitting: *Deleted

## 2022-09-23 ENCOUNTER — Other Ambulatory Visit: Payer: Self-pay | Admitting: Podiatry

## 2022-09-23 MED ORDER — HYDROCODONE-ACETAMINOPHEN 5-325 MG PO TABS: 1.0000 | ORAL_TABLET | Freq: Four times a day (QID) | ORAL | 0 refills | Status: DC | PRN

## 2022-09-23 NOTE — Telephone Encounter (Signed)
Patient is taking Oxycodone, states wants something stronger

## 2022-09-24 ENCOUNTER — Ambulatory Visit (INDEPENDENT_AMBULATORY_CARE_PROVIDER_SITE_OTHER): Payer: Medicaid Other | Admitting: Pulmonary Disease

## 2022-09-24 ENCOUNTER — Encounter: Payer: Self-pay | Admitting: Pulmonary Disease

## 2022-09-24 ENCOUNTER — Encounter
Admission: RE | Admit: 2022-09-24 | Discharge: 2022-09-24 | Disposition: A | Discharge status: Home / Self Care | Payer: Medicaid Other | Source: Ambulatory Visit | Attending: Urology | Admitting: Urology

## 2022-09-24 VITALS — BP 130/82 | HR 56 | Temp 97.7°F | Ht 62.0 in | Wt 184.6 lb

## 2022-09-24 VITALS — Ht 61.0 in | Wt 185.0 lb

## 2022-09-24 DIAGNOSIS — J449 Chronic obstructive pulmonary disease, unspecified: Secondary | ICD-10-CM

## 2022-09-24 DIAGNOSIS — I10 Essential (primary) hypertension: Secondary | ICD-10-CM

## 2022-09-24 DIAGNOSIS — R911 Solitary pulmonary nodule: Secondary | ICD-10-CM | POA: Diagnosis not present

## 2022-09-24 DIAGNOSIS — E6609 Other obesity due to excess calories: Secondary | ICD-10-CM

## 2022-09-24 DIAGNOSIS — F17211 Nicotine dependence, cigarettes, in remission: Secondary | ICD-10-CM

## 2022-09-24 MED ORDER — ALBUTEROL SULFATE HFA 108 (90 BASE) MCG/ACT IN AERS: 2.0000 | INHALATION_SPRAY | Freq: Four times a day (QID) | RESPIRATORY_TRACT | 2 refills | Status: DC | PRN

## 2022-09-24 NOTE — Patient Instructions (Signed)
Continue your Advair and your Spiriva as you are doing.  Congratulations on quitting smoking.  We have sent in a prescription for an emergency inhaler that you can use when you feel short of breath or if you notice you are wheezing.  We are scheduling CT scan of the chest and breathing tests.  We will see you in follow-up in 4 to 6 weeks time.  Call sooner should any new problems arise.

## 2022-09-24 NOTE — Patient Instructions (Addendum)
Your procedure is scheduled on: Monday March 18, 20243 Report to the Registration Desk on the 1st floor of the [facility]. To find out your arrival time, please call [phone] between 1PM - 3PM on: Friday September 27, 2022. If your arrival time is 6:00 am, do not arrive before that time as the [facility] entrance doors do not open until 6:00 am.  REMEMBER: Instructions that are not followed completely may result in serious medical risk, up to and including death; or upon the discretion of your surgeon and anesthesiologist your surgery may need to be rescheduled.  Do not eat food after midnight the night before surgery.  No gum chewing or hard candies.   One week prior to surgery: Stop Anti-inflammatories (NSAIDS) such as Advil, Aleve, Ibuprofen, Motrin, Naproxen, Naprosyn and Aspirin based products such as Excedrin, Goody's Powder, BC Powder. Stop ANY OVER THE COUNTER supplements until after surgery. You may however, continue to take Tylenol if needed for pain up until the day of surgery.  Continue taking all prescribed medications with the exception of the following:   Follow recommendations from Cardiologist or PCP regarding stopping blood thinners.  TAKE ONLY THESE MEDICATIONS THE MORNING OF SURGERY WITH A SIP OF WATER:  ARIPiprazole (ABILIFY) 2 MG  FLUoxetine (PROZAC) 20 MG  lansoprazole (PREVACID) 30 MG Antacid (take one the night before and one on the morning of surgery - helps to prevent nausea after surgery.) oxyCODONE-acetaminophen (PERCOCET) 5-325 MG   Use inhalers on the day of surgery and bring to the hospital. ADVAIR DISKUS 250-50 MCG/ACT AEPB  albuterol (VENTOLIN HFA) 108 (90 Base) MCG/ACT inhaler   No Alcohol for 24 hours before or after surgery.  No Smoking including e-cigarettes for 24 hours before surgery.  No chewable tobacco products for at least 6 hours before surgery.  No nicotine patches on the day of surgery.  Do not use any "recreational" drugs  for at least a week (preferably 2 weeks) before your surgery.  Please be advised that the combination of cocaine and anesthesia may have negative outcomes, up to and including death. If you test positive for cocaine, your surgery will be cancelled.  On the morning of surgery brush your teeth with toothpaste and water, you may rinse your mouth with mouthwash if you wish. Do not swallow any toothpaste or mouthwash.  Use CHG Soap or wipes as directed on instruction sheet.  Do not wear jewelry, make-up, hairpins, clips or nail polish.  Do not wear lotions, powders, or perfumes.   Do not shave body hair from the neck down 48 hours before surgery.  Contact lenses, hearing aids and dentures may not be worn into surgery.  Do not bring valuables to the hospital. [facility] is not responsible for any missing/lost belongings or valuables.   Notify your doctor if there is any change in your medical condition (cold, fever, infection).  Wear comfortable clothing (specific to your surgery type) to the hospital.  After surgery, you can help prevent lung complications by doing breathing exercises.  Take deep breaths and cough every 1-2 hours. Your doctor may order a device called an Incentive Spirometer to help you take deep breaths. When coughing or sneezing, hold a pillow firmly against your incision with both hands. This is called "splinting." Doing this helps protect your incision. It also decreases belly discomfort.  If you are being admitted to the hospital overnight, leave your suitcase in the car. After surgery it may be brought to your room.  In case of increased patient census, it may be necessary for you, the patient, to continue your postoperative care in the Same Day Surgery department.  If you are being discharged the day of surgery, you will not be allowed to drive home. You will need a responsible individual to drive you home and stay with you for 24 hours after surgery.   If you  are taking public transportation, you will need to have a responsible individual with you.  Please call the [facility] Dept. at [phone] if you have any questions about these instructions.  Surgery Visitation Policy:  Patients undergoing a surgery or procedure may have two family members or support persons with them as long as the person is not COVID-19 positive or experiencing its symptoms.   Inpatient Visitation:    Visiting hours are 7 a.m. to 8 p.m. Up to four visitors are allowed at one time in a patient room. The visitors may rotate out with other people during the day. One designated support person (adult) may remain overnight.  Due to an increase in RSV and influenza rates and associated hospitalizations, children ages 37 and under will not be able to visit patients in [facility]. Masks continue to be strongly recommended.

## 2022-09-24 NOTE — Progress Notes (Signed)
Subjective:    Patient ID: Norma [name], female    DOB: [birth year], 47 y.o.   MRN: [id number] Patient Care Team: Magnus Ivan Lincoln Brigham, MD as PCP - General (Family Medicine)  Chief Complaint  Patient presents with   Consult    Nodule. SOB, wheezing and cough with yellowish to greenish color.   HPI Patient is a 47 year old recent former smoker with a 60-pack-year history of smoking and a history as noted below who presents for evaluation of a lung nodule found incidentally on imaging performed 06 September 2022 for hematuria workup.  She is kindly referred by Dr. Hollice Espy.  As noted the patient was being evaluated for microscopic hematuria and underwent a CT abdomen pelvis on 06 September 2022.  On the lower lung windows noted on the CT she was noted to have a 12 x 6 mm groundglass nodule in the posterior right lower lobe.  This is new from prior imaging of 27 Nov 2021.  She has not had a dedicated CT chest.  She does not endorse any fevers, chills or sweats.  She has cough productive of yellowish to greenish sputum in the mornings this has been a chronic issue over the last several years.  He also has noted dyspnea on exertion and wheezing again over the last several years.  She notes that inhalers do help her with the symptoms.  She is currently on Advair and Spiriva.  Carries a diagnosis of COPD however, has no documented FEV1 on record.  When asked if she has a rescue inhaler she could not recall even when shown a chart of the most common inhalers.  She does cite compliance with the Advair and the Spiriva.  She has not had any chest pain.  No orthopnea or paroxysmal nocturnal dyspnea.  No lower extremity edema or calf tenderness.  She currently works at [employer] as a Scientist, water quality.  No significant occupational exposure.  Started smoking age 9, has smoked 2 packs of cigarettes per day for 30 years for a total of 60 pack years.  She quit 15 July 2022.  Review of Systems A 10  point review of systems was performed and it is as noted above otherwise negative.  Past Medical History:  Diagnosis Date   AKI (acute kidney injury) ([place]) 06/04/2016   Anxiety    Bacteremia 06/04/2016   Choledocholithiasis 03/01/2020   Cholelithiasis 03/02/2020   COPD (chronic obstructive pulmonary disease) ([place])    Depression    Diarrhea 06/04/2016   History of kidney stones    Hypertension    Hypokalemia 06/04/2016   IBS (irritable bowel syndrome)    Nausea and vomiting 06/04/2016   Positive blood culture 06/04/2016   Rectal fissure 10/04/2015   Surgery, elective 09/22/2019   Past Surgical History:  Procedure Laterality Date   ABDOMINAL HYSTERECTOMY N/A 11/09/2015   Procedure: HYSTERECTOMY ABDOMINAL;  Surgeon: Jonnie Kind, MD;  Location: [facility];  Service: Gynecology;  Laterality: N/A;   CHOLECYSTECTOMY N/A 03/03/2020   Procedure: LAPAROSCOPIC CHOLECYSTECTOMY;  Surgeon: Aviva Signs, MD;  Location: [facility];  Service: General;  Laterality: N/A;   COLONOSCOPY WITH PROPOFOL N/A 03/29/2021   Procedure: COLONOSCOPY WITH PROPOFOL;  Surgeon: Virgel Manifold, MD;  Location: [facility] ENDOSCOPY;  Service: Endoscopy;  Laterality: N/A;   COLONOSCOPY WITH PROPOFOL N/A 09/05/2021   Procedure: COLONOSCOPY WITH PROPOFOL;  Surgeon: Lin Landsman, MD;  Location: [facility]COPY;  Service: Gastroenterology;  Laterality: N/A;   DILATION AND CURETTAGE OF UTERUS  ESOPHAGOGASTRODUODENOSCOPY (EGD) WITH PROPOFOL N/A 09/05/2021   Procedure: ESOPHAGOGASTRODUODENOSCOPY (EGD) WITH PROPOFOL;  Surgeon: Lin Landsman, MD;  Location: [facility];  Service: Gastroenterology;  Laterality: N/A;   SALPINGOOPHORECTOMY Bilateral 11/09/2015   Procedure: SALPINGO OOPHORECTOMY;  Surgeon: Jonnie Kind, MD;  Location: [facility];  Service: Gynecology;  Laterality: Bilateral;   Patient Active Problem List   Diagnosis Date Noted   Duodenal ulcer    Gastric erosion    Personal history of colonic polyps     Family history of colon cancer requiring screening colonoscopy    Class 1 obesity due to excess calories without serious comorbidity with body mass index (BMI) of 34.0 to 34.9 in adult    Tobacco abuse    Chronic obstructive pulmonary disease (HCC)    HTN (hypertension) 03/01/2020   Chronic low back pain (1ry area of Pain) (Bilateral) (L>R) w/o sciatica 09/22/2019   Cervicalgia 09/22/2019   Chronic neck pain (2ry area of Pain) (Bilateral) (L>R) 09/22/2019   Chronic lower extremity pain (3ry area of Pain) (Bilateral) (L>R) 09/22/2019   Chronic knee pain (Bilateral) (L>R) 09/22/2019   Chronic shoulder pain (Right) 09/22/2019   Chronic pain syndrome 09/22/2019   Disorder of skeletal system 09/22/2019   Problems influencing health status 09/22/2019   UTI (urinary tract infection) 06/04/2016   Abdominal pain, epigastric 06/04/2016   AKI (acute kidney injury) ([place]) 06/04/2016   Endometriosis of pelvis 11/16/2015   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 11/09/2015   Family History  Problem Relation Age of Onset   COPD Mother    Stroke Father    Diabetes Father    Hypertension Father    Heart disease Sister    Seizures Maternal Aunt    Heart disease Maternal Aunt    Birth defects Daughter    Kidney disease Daughter    Bladder Cancer Neg Hx    Kidney cancer Neg Hx    Social History   Tobacco Use   Smoking status: Former    Packs/day: 2.00    Years: 30.00    Total pack years: 60.00    Types: Cigarettes    Quit date: 07/15/2022    Years since quitting: 0.1   Smokeless tobacco: Never  Substance Use Topics   Alcohol use: No    Alcohol/week: 0.0 standard drinks of alcohol   Current Meds  Medication Sig   ADVAIR DISKUS 250-50 MCG/ACT AEPB Inhale 1 puff into the lungs 2 (two) times daily.   albuterol (PROVENTIL HFA;VENTOLIN HFA) 108 (90 BASE) MCG/ACT inhaler Inhale 2 puffs into the lungs every 6 (six) hours as needed for wheezing.    ARIPiprazole (ABILIFY) 2 MG tablet Take 2 mg by mouth daily.   clonazePAM (KLONOPIN) 1 MG tablet Take 1 mg by mouth 3 (three) times daily as needed.   doxepin (SINEQUAN) 10 MG capsule Take 10 mg by mouth at bedtime.   FLUoxetine (PROZAC) 20 MG capsule Take 20 mg by mouth daily.   fluticasone (FLONASE) 50 MCG/ACT nasal spray Place 1 spray into both nostrils daily.   HYDROcodone-acetaminophen (NORCO) 5-325 MG tablet Take 1 tablet by mouth every 6 (six) hours as needed for moderate pain.   lansoprazole (PREVACID) 30 MG capsule Take 30 mg by mouth 2 (two) times daily.   lisinopril (ZESTRIL) 20 MG tablet Take 20 mg by mouth daily.   nystatin cream (MYCOSTATIN) Apply 1 Application topically as needed.   oxyCODONE-acetaminophen (PERCOCET) 5-325 MG tablet Take 1 tablet by mouth every 4 (four)  hours as needed for severe pain.   oxyCODONE-acetaminophen (PERCOCET) 5-325 MG tablet Take 1 tablet by mouth every 4 (four) hours as needed for severe pain.   oxyCODONE-acetaminophen (PERCOCET) 5-325 MG tablet Take 1 tablet by mouth every 4 (four) hours as needed for severe pain.   oxyCODONE-acetaminophen (PERCOCET) 5-325 MG tablet Take 1 tablet by mouth every 4 (four) hours as needed for severe pain.   SPIRIVA RESPIMAT 1.25 MCG/ACT AERS 2 puffs daily   UBRELVY 100 MG TABS Take 1 tablet by mouth as needed.   There is no immunization history for the selected administration types on file for this patient.     Objective:   Physical Exam BP 130/82 (BP Location: Left Arm, Cuff Size: Normal)   Pulse (!) 56   Temp 97.7 F (36.5 C)   Ht '5\' 2"'$  (1.575 m)   Wt 184 lb 9.6 oz (83.7 kg)   LMP 09/24/2015   SpO2 100%   BMI 33.76 kg/m   SpO2: 100 % O2 Device: None (Room air)  GENERAL: Well-developed, overweight woman, fully ambulatory.  No conversational dyspnea.  Flat affect. HEAD: Normocephalic, atraumatic.  EYES: Pupils equal, round, reactive to light.  No scleral icterus.  MOUTH: Poor dentition with chipped  teeth.  Oral mucosa moist.  No thrush. NECK: Supple. No thyromegaly. Trachea midline. No JVD.  No adenopathy. PULMONARY: Good air entry bilaterally.  Coarse, otherwise, no adventitious sounds. CARDIOVASCULAR: S1 and S2. Regular rate and rhythm.  No rubs, murmurs or gallops heard. ABDOMEN: Benign. MUSCULOSKELETAL: No joint deformity, no clubbing, no edema.  NEUROLOGIC: No overt focal deficit. SKIN: Intact,warm,dry. PSYCH: Flat affect, behavior normal.  Representative image from the CT abdomen and pelvis performed 06 September 2022 showing a groundglass nodule on the posterior right lower lobe (arrow):     Assessment & Plan:     ICD-10-CM   1. Lung nodule seen on imaging study  R91.1 CT CHEST WO CONTRAST   Will obtain dedicated chest CT Further recommendations once study completed    2. COPD suggested by initial evaluation [facility]  J44.9 Pulmonary Function Test [facility] Only   Will obtain PFTs Continue Advair and Spiriva for now Albuterol for as needed use    3. Tobacco dependence due to cigarettes, in remission  F17.211    Quit 15 July 2022 No relapse thus far     Orders Placed This Encounter  Procedures   CT CHEST WO CONTRAST    Standing Status:   Future    Standing Expiration Date:   09/24/2023    Order Specific Question:   Preferred imaging location?    Answer:   [facility]    Order Specific Question:   Is patient pregnant?    Answer:   No   Pulmonary Function Test [facility] Only    Standing Status:   Future    Standing Expiration Date:   09/24/2023    Order Specific Question:   Full PFT: includes the following: basic spirometry, spirometry pre & post bronchodilator, diffusion capacity (DLCO), lung volumes    Answer:   Full PFT    Order Specific Question:   This test can only be performed at    Answer:   [facility] ordered this encounter  Medications   albuterol (VENTOLIN HFA) 108 (90 Base) MCG/ACT inhaler    Sig: Inhale 2 puffs into the lungs every 6  (six) hours as needed for wheezing or shortness of breath.    Dispense:  8 g  Refill:  2   We will see the patient in follow-up in 4 to 6 weeks time she is to contact us prior to that time should any new difficulties arise.  Renold Don, MD Advanced Bronchoscopy PCCM [facility]    *This note was dictated using voice recognition software/Dragon.  Despite best efforts to proofread, errors can occur which can change the meaning. Any transcriptional errors that result from this process are unintentional and may not be fully corrected at the time of dictation.

## 2022-09-26 ENCOUNTER — Encounter
Admission: RE | Admit: 2022-09-26 | Discharge: 2022-09-26 | Disposition: A | Discharge status: Home / Self Care | Payer: Medicaid Other | Source: Ambulatory Visit | Attending: Urology | Admitting: Urology

## 2022-09-26 DIAGNOSIS — Z01818 Encounter for other preprocedural examination: Secondary | ICD-10-CM | POA: Insufficient documentation

## 2022-09-26 DIAGNOSIS — E6609 Other obesity due to excess calories: Secondary | ICD-10-CM | POA: Diagnosis not present

## 2022-09-26 DIAGNOSIS — I10 Essential (primary) hypertension: Secondary | ICD-10-CM | POA: Diagnosis not present

## 2022-09-26 DIAGNOSIS — Z6834 Body mass index (BMI) 34.0-34.9, adult: Secondary | ICD-10-CM | POA: Diagnosis not present

## 2022-09-26 LAB — CBC
HCT: 34.9 % — ABNORMAL LOW (ref 36.0–46.0)
Hemoglobin: 11.3 g/dL — ABNORMAL LOW (ref 12.0–15.0)
MCH: 29.1 pg (ref 26.0–34.0)
MCHC: 32.4 g/dL (ref 30.0–36.0)
MCV: 89.9 fL (ref 80.0–100.0)
Platelets: 291 10*3/uL (ref 150–400)
RBC: 3.88 MIL/uL (ref 3.87–5.11)
RDW: 14.6 % (ref 11.5–15.5)
WBC: 4.6 10*3/uL (ref 4.0–10.5)
nRBC: 0 % (ref 0.0–0.2)

## 2022-09-26 LAB — BASIC METABOLIC PANEL
Anion gap: 9 (ref 5–15)
BUN: 16 mg/dL (ref 6–20)
CO2: 23 mmol/L (ref 22–32)
Calcium: 8.6 mg/dL — ABNORMAL LOW (ref 8.9–10.3)
Chloride: 109 mmol/L (ref 98–111)
Creatinine, Ser: 0.69 mg/dL (ref 0.44–1.00)
GFR, Estimated: >60 mL/min (ref >60)
Glucose, Bld: 84 mg/dL (ref 70–99)
Potassium: 3.5 mmol/L (ref 3.5–5.1)
Sodium: 141 mmol/L (ref 135–145)

## 2022-09-29 MED ORDER — CEFAZOLIN SODIUM-DEXTROSE 2-4 GM/100ML-% IV SOLN
2.0000 g | INTRAVENOUS | Status: AC
  Administered 2022-09-30: 2 g via INTRAVENOUS

## 2022-09-29 MED ORDER — CHLORHEXIDINE GLUCONATE 0.12 % MT SOLN: 15.0000 mL | Freq: Once | OROMUCOSAL | Status: AC

## 2022-09-29 MED ORDER — ORAL CARE MOUTH RINSE: 15.0000 mL | Freq: Once | OROMUCOSAL | Status: AC

## 2022-09-29 MED ORDER — LACTATED RINGERS IV SOLN: INTRAVENOUS | Status: DC

## 2022-09-30 ENCOUNTER — Ambulatory Visit: Payer: Medicaid Other | Admitting: Urgent Care

## 2022-09-30 ENCOUNTER — Ambulatory Visit: Payer: Medicaid Other

## 2022-09-30 ENCOUNTER — Encounter
Admission: RE | Disposition: A | Discharge status: Home / Self Care | Payer: Self-pay | Source: Home / Self Care | Attending: Urology

## 2022-09-30 ENCOUNTER — Ambulatory Visit
Admission: RE | Admit: 2022-09-30 | Discharge: 2022-09-30 | Disposition: A | Discharge status: Home / Self Care | Payer: Medicaid Other | Attending: Urology | Admitting: Urology

## 2022-09-30 ENCOUNTER — Other Ambulatory Visit: Payer: Self-pay

## 2022-09-30 ENCOUNTER — Encounter: Payer: Self-pay | Admitting: Urology

## 2022-09-30 DIAGNOSIS — K219 Gastro-esophageal reflux disease without esophagitis: Secondary | ICD-10-CM | POA: Diagnosis not present

## 2022-09-30 DIAGNOSIS — N3030 Trigonitis without hematuria: Secondary | ICD-10-CM | POA: Diagnosis not present

## 2022-09-30 DIAGNOSIS — R3129 Other microscopic hematuria: Secondary | ICD-10-CM

## 2022-09-30 DIAGNOSIS — F17200 Nicotine dependence, unspecified, uncomplicated: Secondary | ICD-10-CM | POA: Diagnosis not present

## 2022-09-30 DIAGNOSIS — N3080 Other cystitis without hematuria: Secondary | ICD-10-CM | POA: Diagnosis not present

## 2022-09-30 DIAGNOSIS — N200 Calculus of kidney: Secondary | ICD-10-CM | POA: Diagnosis not present

## 2022-09-30 DIAGNOSIS — J449 Chronic obstructive pulmonary disease, unspecified: Secondary | ICD-10-CM | POA: Insufficient documentation

## 2022-09-30 HISTORY — PX: CYSTOSCOPY WITH BIOPSY: SHX5122

## 2022-09-30 HISTORY — PX: CYSTOSCOPY/URETEROSCOPY/HOLMIUM LASER/STENT PLACEMENT: SHX6546

## 2022-09-30 SURGERY — CYSTOSCOPY/URETEROSCOPY/HOLMIUM LASER/STENT PLACEMENT
Anesthesia: General | Laterality: Right

## 2022-09-30 MED ORDER — OXYCODONE HCL 5 MG PO TABS: 5.0000 mg | ORAL_TABLET | Freq: Once | ORAL | Status: DC | PRN

## 2022-09-30 MED ORDER — CHLORHEXIDINE GLUCONATE 0.12 % MT SOLN
OROMUCOSAL | Status: AC
  Administered 2022-09-30: 15 mL via OROMUCOSAL
  Filled 2022-09-30: qty 15

## 2022-09-30 MED ORDER — OXYBUTYNIN CHLORIDE 5 MG PO TABS: 5.0000 mg | ORAL_TABLET | Freq: Three times a day (TID) | ORAL | 0 refills | Status: DC | PRN

## 2022-09-30 MED ORDER — ONDANSETRON HCL 4 MG/2ML IJ SOLN: 4.0000 mg | Freq: Once | INTRAMUSCULAR | Status: AC | PRN

## 2022-09-30 MED ORDER — ACETAMINOPHEN 10 MG/ML IV SOLN
INTRAVENOUS | Status: AC
  Filled 2022-09-30: qty 100

## 2022-09-30 MED ORDER — SUGAMMADEX SODIUM 200 MG/2ML IV SOLN
INTRAVENOUS | Status: DC | PRN
  Administered 2022-09-30: 167.8 mg via INTRAVENOUS

## 2022-09-30 MED ORDER — HYDROMORPHONE HCL 1 MG/ML IJ SOLN
INTRAMUSCULAR | Status: AC
  Administered 2022-09-30: 0.5 mg via INTRAVENOUS
  Filled 2022-09-30: qty 0.5

## 2022-09-30 MED ORDER — LIDOCAINE HCL (CARDIAC) PF 100 MG/5ML IV SOSY
PREFILLED_SYRINGE | INTRAVENOUS | Status: DC | PRN
  Administered 2022-09-30: 80 mg via INTRAVENOUS

## 2022-09-30 MED ORDER — ONDANSETRON HCL 4 MG/2ML IJ SOLN
INTRAMUSCULAR | Status: AC
  Administered 2022-09-30: 4 mg via INTRAVENOUS
  Filled 2022-09-30: qty 2

## 2022-09-30 MED ORDER — ONDANSETRON HCL 4 MG/2ML IJ SOLN
INTRAMUSCULAR | Status: AC
  Filled 2022-09-30: qty 2

## 2022-09-30 MED ORDER — HYDROCODONE-ACETAMINOPHEN 5-325 MG PO TABS: 1.0000 | ORAL_TABLET | Freq: Four times a day (QID) | ORAL | 0 refills | Status: DC | PRN

## 2022-09-30 MED ORDER — MIDAZOLAM HCL 2 MG/2ML IJ SOLN
INTRAMUSCULAR | Status: AC
  Filled 2022-09-30: qty 2

## 2022-09-30 MED ORDER — EPHEDRINE SULFATE (PRESSORS) 50 MG/ML IJ SOLN
INTRAMUSCULAR | Status: DC | PRN
  Administered 2022-09-30: 10 mg via INTRAVENOUS
  Administered 2022-09-30 (×2): 5 mg via INTRAVENOUS

## 2022-09-30 MED ORDER — OXYBUTYNIN CHLORIDE 5 MG PO TABS
ORAL_TABLET | ORAL | Status: AC
  Administered 2022-09-30: 5 mg via ORAL
  Filled 2022-09-30: qty 1

## 2022-09-30 MED ORDER — CEFAZOLIN SODIUM-DEXTROSE 2-4 GM/100ML-% IV SOLN
INTRAVENOUS | Status: AC
  Filled 2022-09-30: qty 100

## 2022-09-30 MED ORDER — ONDANSETRON HCL 4 MG/2ML IJ SOLN
INTRAMUSCULAR | Status: DC | PRN
  Administered 2022-09-30: 4 mg via INTRAVENOUS

## 2022-09-30 MED ORDER — EPHEDRINE 5 MG/ML INJ
INTRAVENOUS | Status: AC
  Filled 2022-09-30: qty 5

## 2022-09-30 MED ORDER — FENTANYL CITRATE (PF) 100 MCG/2ML IJ SOLN
25.0000 ug | INTRAMUSCULAR | Status: DC | PRN
  Administered 2022-09-30: 50 ug via INTRAVENOUS

## 2022-09-30 MED ORDER — FENTANYL CITRATE (PF) 100 MCG/2ML IJ SOLN
INTRAMUSCULAR | Status: AC
  Administered 2022-09-30: 50 ug via INTRAVENOUS
  Filled 2022-09-30: qty 2

## 2022-09-30 MED ORDER — ROCURONIUM BROMIDE 100 MG/10ML IV SOLN
INTRAVENOUS | Status: DC | PRN
  Administered 2022-09-30: 20 mg via INTRAVENOUS
  Administered 2022-09-30: 50 mg via INTRAVENOUS

## 2022-09-30 MED ORDER — OXYCODONE HCL 5 MG/5ML PO SOLN: 5.0000 mg | Freq: Once | ORAL | Status: DC | PRN

## 2022-09-30 MED ORDER — PROPOFOL 10 MG/ML IV BOLUS
INTRAVENOUS | Status: DC | PRN
  Administered 2022-09-30: 15 mg via INTRAVENOUS
  Administered 2022-09-30: 50 mg via INTRAVENOUS

## 2022-09-30 MED ORDER — PROPOFOL 10 MG/ML IV BOLUS
INTRAVENOUS | Status: AC
  Filled 2022-09-30: qty 20

## 2022-09-30 MED ORDER — DEXAMETHASONE SODIUM PHOSPHATE 10 MG/ML IJ SOLN
INTRAMUSCULAR | Status: DC | PRN
  Administered 2022-09-30: 5 mg via INTRAVENOUS

## 2022-09-30 MED ORDER — ROCURONIUM BROMIDE 10 MG/ML (PF) SYRINGE
PREFILLED_SYRINGE | INTRAVENOUS | Status: AC
  Filled 2022-09-30: qty 10

## 2022-09-30 MED ORDER — LIDOCAINE HCL (PF) 2 % IJ SOLN
INTRAMUSCULAR | Status: AC
  Filled 2022-09-30: qty 5

## 2022-09-30 MED ORDER — MIDAZOLAM HCL 2 MG/2ML IJ SOLN
INTRAMUSCULAR | Status: DC | PRN
  Administered 2022-09-30: 2 mg via INTRAVENOUS

## 2022-09-30 MED ORDER — ACETAMINOPHEN 10 MG/ML IV SOLN: 1000.0000 mg | Freq: Once | INTRAVENOUS | Status: DC | PRN

## 2022-09-30 MED ORDER — FENTANYL CITRATE (PF) 100 MCG/2ML IJ SOLN
INTRAMUSCULAR | Status: AC
  Filled 2022-09-30: qty 2

## 2022-09-30 MED ORDER — OXYBUTYNIN CHLORIDE 5 MG PO TABS
5.0000 mg | ORAL_TABLET | Freq: Once | ORAL | Status: AC
  Filled 2022-09-30: qty 1

## 2022-09-30 MED ORDER — ACETAMINOPHEN 10 MG/ML IV SOLN
INTRAVENOUS | Status: DC | PRN
  Administered 2022-09-30: 1000 mg via INTRAVENOUS

## 2022-09-30 MED ORDER — TAMSULOSIN HCL 0.4 MG PO CAPS: 0.4000 mg | ORAL_CAPSULE | Freq: Every day | ORAL | 0 refills | Status: DC

## 2022-09-30 MED ORDER — IOHEXOL 180 MG/ML  SOLN
INTRAMUSCULAR | Status: DC | PRN
  Administered 2022-09-30: 10 mL

## 2022-09-30 MED ORDER — SODIUM CHLORIDE 0.9 % IR SOLN
Status: DC | PRN
  Administered 2022-09-30: 1000 mL via INTRAVESICAL

## 2022-09-30 MED ORDER — FENTANYL CITRATE (PF) 100 MCG/2ML IJ SOLN
INTRAMUSCULAR | Status: DC | PRN
  Administered 2022-09-30: 100 ug via INTRAVENOUS

## 2022-09-30 MED ORDER — HYDROMORPHONE HCL 1 MG/ML IJ SOLN
0.5000 mg | INTRAMUSCULAR | Status: AC | PRN
  Administered 2022-09-30: 0.5 mg via INTRAVENOUS

## 2022-09-30 MED ORDER — HYDROMORPHONE HCL 1 MG/ML IJ SOLN
INTRAMUSCULAR | Status: AC
  Filled 2022-09-30: qty 0.5

## 2022-09-30 SURGICAL SUPPLY — 32 items
ADH LQ OCL WTPRF AMP STRL LF (MISCELLANEOUS) ×2
ADHESIVE MASTISOL STRL (MISCELLANEOUS) IMPLANT
BAG DRAIN SIEMENS DORNER NS (MISCELLANEOUS) ×2 IMPLANT
BAG DRN NS LF (MISCELLANEOUS) ×2
BRUSH SCRUB EZ  4% CHG (MISCELLANEOUS) ×2
BRUSH SCRUB EZ 4% CHG (MISCELLANEOUS) ×2 IMPLANT
CATH URET FLEX-TIP 2 LUMEN 10F (CATHETERS) IMPLANT
CATH URETL OPEN 5X70 (CATHETERS) ×2 IMPLANT
CNTNR URN SCR LID CUP LEK RST (MISCELLANEOUS) IMPLANT
CONT SPEC 4OZ STRL OR WHT (MISCELLANEOUS)
DRAPE UTILITY 15X26 TOWEL STRL (DRAPES) ×2 IMPLANT
DRSG TEGADERM 2-3/8X2-3/4 SM (GAUZE/BANDAGES/DRESSINGS) IMPLANT
DRSG TELFA 3X4 N-ADH STERILE (GAUZE/BANDAGES/DRESSINGS) ×2 IMPLANT
ELECT REM PT RETURN 9FT ADLT (ELECTROSURGICAL) ×2
ELECTRODE REM PT RTRN 9FT ADLT (ELECTROSURGICAL) ×2 IMPLANT
FIBER LASER MOSES 200 DFL (Laser) IMPLANT
GLOVE BIO SURGEON STRL SZ 6.5 (GLOVE) ×2 IMPLANT
GOWN STRL REUS W/ TWL LRG LVL3 (GOWN DISPOSABLE) ×4 IMPLANT
GOWN STRL REUS W/TWL LRG LVL3 (GOWN DISPOSABLE) ×4
GUIDEWIRE GREEN .038 145CM (MISCELLANEOUS) IMPLANT
GUIDEWIRE STR DUAL SENSOR (WIRE) ×2 IMPLANT
IV NS IRRIG 3000ML ARTHROMATIC (IV SOLUTION) ×2 IMPLANT
KIT TURNOVER CYSTO (KITS) ×2 IMPLANT
NDL SAFETY ECLIP 18X1.5 (MISCELLANEOUS) ×2 IMPLANT
PACK CYSTO AR (MISCELLANEOUS) ×2 IMPLANT
SET CYSTO W/LG BORE CLAMP LF (SET/KITS/TRAYS/PACK) ×2 IMPLANT
STENT URET 6FRX24 CONTOUR (STENTS) IMPLANT
SURGILUBE 2OZ TUBE FLIPTOP (MISCELLANEOUS) ×2 IMPLANT
TRAP FLUID SMOKE EVACUATOR (MISCELLANEOUS) ×2 IMPLANT
WATER STERILE IRR 1000ML POUR (IV SOLUTION) ×2 IMPLANT
WATER STERILE IRR 3000ML UROMA (IV SOLUTION) ×2 IMPLANT
WATER STERILE IRR 500ML POUR (IV SOLUTION) ×2 IMPLANT

## 2022-09-30 NOTE — Transfer of Care (Signed)
Immediate Anesthesia Transfer of Care Note  Patient: Norma [name]  Procedure(s) Performed: CYSTOSCOPY/URETEROSCOPY/HOLMIUM LASER/STENT PLACEMENT (Right) CYSTOSCOPY WITH BLADDER BIOPSY  Patient Location: PACU  Anesthesia Type:General  Level of Consciousness: awake, drowsy, and patient cooperative  Airway & Oxygen Therapy: Patient Spontanous Breathing and Patient connected to face mask oxygen  Post-op Assessment: Report given to RN and Post -op Vital signs reviewed and stable  Post vital signs: Reviewed and stable  Last Vitals:  Vitals Value Taken Time  BP 147/94 09/30/22 1216  Temp    Pulse 102 09/30/22 1220  Resp 15 09/30/22 1220  SpO2 100 % 09/30/22 1220  Vitals shown include unvalidated device data.  Last Pain:  Vitals:   09/30/22 0828  PainSc: 8       Patients Stated Pain Goal: 2 ([id number])  Complications: No notable events documented.

## 2022-09-30 NOTE — Interval H&P Note (Signed)
History and Physical Interval Note:  09/30/2022 10:49 AM  Norma [name]  has presented today for surgery, with the diagnosis of Right Nephrolithiasis, Bladder Erythema, Microscopic Hematuria.  The various methods of treatment have been discussed with the patient and family. After consideration of risks, benefits and other options for treatment, the patient has consented to  Procedure(s): CYSTOSCOPY/URETEROSCOPY/HOLMIUM LASER/STENT PLACEMENT (Right) CYSTOSCOPY WITH BLADDER BIOPSY (N/A) as a surgical intervention.  The patient's history has been reviewed, patient examined, no change in status, stable for surgery.  I have reviewed the patient's chart and labs.  Questions were answered to the patient's satisfaction.    RRR CTAB   Hollice Espy

## 2022-09-30 NOTE — Anesthesia Preprocedure Evaluation (Signed)
Anesthesia Evaluation  Patient identified by MRN, date of birth, ID band Patient awake  General Assessment Comment:  Patient requesting anxiety and pain medication soon after I walked into the room and introduced myself for my preoperative evaluation. She has right sided abdominal and flank pain ,corresponding with the location of her kidney stone. I assured her we will work with her to manage her pain, but that sedating her prior to the entire team (Interior and spatial designer) talking to her would not be appropriate. She understands.  Reviewed: Allergy & Precautions, NPO status , Patient's Chart, lab work & pertinent test results  History of Anesthesia Complications Negative for: history of anesthetic complications  Airway Mallampati: II  TM Distance: <3 FB Neck ROM: Full    Dental no notable dental hx. (+) Teeth Intact   Pulmonary neg sleep apnea, COPD,  COPD inhaler, Patient abstained from smoking.Not current smoker, former smoker   Pulmonary exam normal breath sounds clear to auscultation       Cardiovascular Exercise Tolerance: Good METShypertension, Pt. on medications (-) CAD and (-) Past MI (-) dysrhythmias  Rhythm:Regular Rate:Normal - Systolic murmurs    Neuro/Psych  PSYCHIATRIC DISORDERS Anxiety Depression    negative neurological ROS     GI/Hepatic PUD,GERD  Controlled and Medicated,,(+)     (-) substance abuse    Endo/Other  neg diabetes    Renal/GU negative Renal ROS     Musculoskeletal On opioids at home   Abdominal  (+) + obese Abdomen: tender.   Peds  Hematology   Anesthesia Other Findings Past Medical History: 06/04/2016: AKI (acute kidney injury) ([place]) No date: Anxiety 06/04/2016: Bacteremia 03/01/2020: Choledocholithiasis 03/02/2020: Cholelithiasis No date: COPD (chronic obstructive pulmonary disease) ([facility]) No date: Depression 06/04/2016: Diarrhea No date: History of kidney stones No date:  Hypertension 06/04/2016: Hypokalemia No date: IBS (irritable bowel syndrome) 06/04/2016: Nausea and vomiting 06/04/2016: Positive blood culture 10/04/2015: Rectal fissure 09/22/2019: Surgery, elective  Reproductive/Obstetrics                              Anesthesia Physical Anesthesia Plan  ASA: 2  Anesthesia Plan: General   Post-op Pain Management: Dilaudid IV and Ofirmev IV (intra-op)*   Induction: Intravenous  PONV Risk Score and Plan: 4 or greater and Ondansetron, Dexamethasone and Midazolam  Airway Management Planned: Oral ETT  Additional Equipment: None  Intra-op Plan:   Post-operative Plan: Extubation in OR  Informed Consent: I have reviewed the patients History and Physical, chart, labs and discussed the procedure including the risks, benefits and alternatives for the proposed anesthesia with the patient or authorized representative who has indicated his/her understanding and acceptance.     Dental advisory given  Plan Discussed with: CRNA and Surgeon  Anesthesia Plan Comments: (Discussed risks of anesthesia with patient, including PONV, sore throat, lip/dental/eye damage. Rare risks discussed as well, such as cardiorespiratory and neurological sequelae, and allergic reactions. Discussed the role of CRNA in patient's perioperative care. Patient understands.)         Anesthesia Quick Evaluation

## 2022-09-30 NOTE — Op Note (Signed)
Date of procedure: 09/30/22  Preoperative diagnosis:  Right kidney stone Bladder erythema   Postoperative diagnosis:  Same as above  Procedure: Bladder biopsy Right ureteroscopy with laser lithotripsy Right retrograde pyelogram Right ureteral stent placement Interpretation of fluoroscopy less than 30 minutes  Surgeon: Hollice Espy, MD  Anesthesia: General  Complications: None  Intraoperative findings: Nonobstructing right renal pelvic stone obliterated.  Persistent right posterior bladder wall erythema, approximately 1 cm biopsy x 2 and remainder of the area fulgurated.  Stent placed on tether.  EBL: Minimal  Specimens: Bladder biosy, right lateral bladder wall  Drains: 6 x 24 French double-J ureteral stent on right with tether  Indication: Norma [name] is a 47 y.o. patient with personal smoking history found to have microscopic hematuria.  She has a 1 cm known right nonobstructing kidney stone as well as presence of bladder erythema.  After reviewing the management options for treatment, she elected to proceed with the above surgical procedure(s). We have discussed the potential benefits and risks of the procedure, side effects of the proposed treatment, the likelihood of the patient achieving the goals of the procedure, and any potential problems that might occur during the procedure or recuperation. Informed consent has been obtained.  Description of procedure:  The patient was taken to the operating room and general anesthesia was induced.  The patient was placed in the dorsal lithotomy position, prepped and draped in the usual sterile fashion, and preoperative antibiotics were administered. A preoperative time-out was performed.   A 21 French scope was advanced per urethra into the bladder.  The bladder was carefully inspected.  There was persistent bladder erythema on the right lateral bladder wall somewhat posteriorly located, approximately 1 cm.  This was  distinctly different than the remainder of the bladder is suspicious for either focal cystitis or CIS of the bladder.  Cold cup biopsy forceps were used to biopsy this area x 2.  The biopsy site as well as base of the area was then completely fulgurated with distractive purposes.  Hemostasis was excellent.  Next, attention was turned to the right kidney stone.  Could be easily seen on scout imaging.  The right UO was intubated using a 5 [place] open-ended ureteral catheter.  Gentle retrograde pyelogram was performed which showed a delicate appearing ureter without filling defects.  A sensor wire was then placed up to the level of the kidney.  A dual-lumen access sheath was advanced to the level of the mid ureter and a retrograde pyelogram was performed by injecting contrast in the second lumen.  The ureter was delicate without filling defects.  There is no hydronephrosis noted.  A Super Stiff wire was then placed up to the level of the kidney as a working wire.  I then advanced an access sheath over the working wire to the proximal ureter.  Upon doing so however, the sensor wire backed out somewhat and I ended up having to repeat the process in order to exclude the sensor wire as a safety wire to the level of the kidney.  Once it was all situated, I advanced a dual-lumen digital flexible ureteroscope up to the renal pelvis where the stone was encountered.  A 200 m laser fiber was then used using settings of 0.3 J and 80 Hz, the stone was completely dusted into innumerable tiny fragments approximately the tip of the size of the laser fiber when completed.  Final retrograde showed no contrast extravasation and created a roadmap to ensure that each  every calyx was directly visualized and no significant residual stone burden remained.  I then backed the scope down the length the ureter inspecting along the way.  There were no ureteral injuries appreciated.  I then placed a 6 x 24 French double-J ureteral stent over  the working wire up to the level of the kidney.  Upon wire withdrawal, there was a full coil noted both within the renal pelvis as well as within the bladder.  The stent string was left attached to the distal coil of the stent which was affixed to the patient's left inner thigh using Mastisol and Tegaderm.  She was then cleaned and dried, repositioned in supine position, reversed of anesthesia, and taken to the PACU in stable condition.  Plan: She may remove her own stent on Friday.  Will have her follow-up in 4 weeks with renal ultrasound prior.  Will call her with her pathology results.  Hollice Espy, M.D.

## 2022-09-30 NOTE — Anesthesia Postprocedure Evaluation (Signed)
Anesthesia Post Note  Patient: Norma [name]  Procedure(s) Performed: CYSTOSCOPY/URETEROSCOPY/HOLMIUM LASER/STENT PLACEMENT (Right) CYSTOSCOPY WITH BLADDER BIOPSY  Patient location during evaluation: PACU Anesthesia Type: General Level of consciousness: awake and alert Pain management: pain level controlled Vital Signs Assessment: post-procedure vital signs reviewed and stable Respiratory status: spontaneous breathing, nonlabored ventilation, respiratory function stable and patient connected to nasal cannula oxygen Cardiovascular status: blood pressure returned to baseline and stable Postop Assessment: no apparent nausea or vomiting Anesthetic complications: no   No notable events documented.   Last Vitals:  Vitals:   09/30/22 1252 09/30/22 1310  BP: 127/81 (!) 149/82  Pulse: 69 62  Resp: 20 17  Temp: (!) 36.4 C (!) 36.3 C  SpO2: 100% 95%    Last Pain:  Vitals:   09/30/22 1310  TempSrc: Temporal  PainSc: 4                  Arita Miss

## 2022-09-30 NOTE — Discharge Instructions (Addendum)
You have a ureteral stent in place.  This is a tube that extends from your kidney to your bladder.  This may cause urinary bleeding, burning with urination, and urinary frequency.  Please call our office or present to the ED if you develop fevers >101 or pain which is not able to be controlled with oral pain medications.  You may be given either Flomax and/ or ditropan to help with bladder spasms and stent pain in addition to pain medications.    Your stent is on a string taped to your left inner thigh.  You may pull it on Friday.  Please try to avoid pulling it prematurely.  [facility] [address] [place], [place] [address] [phone]  AMBULATORY SURGERY  DISCHARGE INSTRUCTIONS   The drugs that you were given will stay in your system until tomorrow so for the next 24 hours you should not:  Drive an automobile Make any legal decisions Drink any alcoholic beverage   You may resume regular meals tomorrow.  Today it is better to start with liquids and gradually work up to solid foods.  You may eat anything you prefer, but it is better to start with liquids, then soup and crackers, and gradually work up to solid foods.   Please notify your doctor immediately if you have any unusual bleeding, trouble breathing, redness and pain at the surgery site, drainage, fever, or pain not relieved by medication.    Additional Instructions:    Please contact your physician with any problems or Same Day Surgery at [phone], Monday through Friday 6 am to 4 pm, or [place] [facility] number at [phone].

## 2022-09-30 NOTE — Anesthesia Procedure Notes (Addendum)
Procedure Name: Intubation Date/Time: 09/30/2022 10:23 AM  Performed by: Jacqualin Combes, CRNAPre-anesthesia Checklist: Patient identified, Emergency Drugs available, Suction available and Patient being monitored Patient Re-evaluated:Patient Re-evaluated prior to induction Oxygen Delivery Method: Circle system utilized Preoxygenation: Pre-oxygenation with 100% oxygen Induction Type: IV induction Ventilation: Mask ventilation without difficulty Laryngoscope Size: Mac and 3 Grade View: Grade I Tube type: Oral Tube size: 6.5 mm Number of attempts: 1 Airway Equipment and Method: Stylet and Oral airway Placement Confirmation: ETT inserted through vocal cords under direct vision, positive ETCO2 and breath sounds checked- equal and bilateral Secured at: 18 cm Tube secured with: Tape Dental Injury: Teeth and Oropharynx as per pre-operative assessment  Comments: Lauren Cozart, SRNA placed ETT under supervision. Dental exam remain unchanged as pre-operative assessment, eyes protected. Bilateral breath sounds, positive ETCO2.

## 2022-10-01 ENCOUNTER — Encounter: Payer: Self-pay | Admitting: Urology

## 2022-10-01 ENCOUNTER — Other Ambulatory Visit: Payer: Self-pay

## 2022-10-01 ENCOUNTER — Ambulatory Visit
Admission: RE | Admit: 2022-10-01 | Discharge: 2022-10-01 | Disposition: A | Discharge status: Home / Self Care | Payer: Medicaid Other | Source: Ambulatory Visit | Attending: Pulmonary Disease | Admitting: Pulmonary Disease

## 2022-10-01 ENCOUNTER — Telehealth: Payer: Self-pay

## 2022-10-01 ENCOUNTER — Ambulatory Visit: Payer: Medicaid Other

## 2022-10-01 DIAGNOSIS — N200 Calculus of kidney: Secondary | ICD-10-CM

## 2022-10-01 DIAGNOSIS — R911 Solitary pulmonary nodule: Secondary | ICD-10-CM | POA: Diagnosis not present

## 2022-10-01 LAB — SURGICAL PATHOLOGY

## 2022-10-01 MED ORDER — OXYCODONE-ACETAMINOPHEN 5-325 MG PO TABS: 1.0000 | ORAL_TABLET | ORAL | 0 refills | Status: DC | PRN

## 2022-10-01 NOTE — Telephone Encounter (Signed)
Recommend ibuprofen 800 mg 3 times a day, make sure taking Flomax and oxybutynin to help with irritation from the stent.  Will fill one-time prescription for Percocet dispense #10 but after that, she will have to rely on non narcotic options so use sparingly.    Hollice Espy, MD

## 2022-10-01 NOTE — Telephone Encounter (Signed)
Patient advised.

## 2022-10-01 NOTE — Telephone Encounter (Signed)
Called received via triage line 9am  Pt is s/p right ureteroscopy with laser litho, stent placement, and bladder bx on 3/18 by AJB.   She reports that her pain is a 9. She is taking Norc 2 q 2h. She states she is also taking ibuprofen 1-2 q hour.   She is getting no relief from pain. No fevers noted. Pt states she normally takes Percocet for pain.   Pt was given # 30 percocet by Dr. Posey Pronto on 3/6 and 3/7. Asked pt if she could take percocet instead of norc if that works better for her.  She states she is out.   That is 60 tabs of percocet in less than 2 weeks.Pt states she is in a lot of pain.   [place] aware AJB in clinic and we have 48h to respond.

## 2022-10-04 ENCOUNTER — Telehealth: Payer: Self-pay | Admitting: Pulmonary Disease

## 2022-10-04 NOTE — Telephone Encounter (Signed)
Patient is returning phone call. Patient phone number is [phone].

## 2022-10-04 NOTE — Telephone Encounter (Signed)
Norma Pita, MD 10/04/2022  9:38 AM EDT     Her CT chest was reviewed.  Good news, the previously noted nodule on the right lower lobe is no longer seen.  This was likely due to infection or inflammation.  There are no other nodules noted.  All is good.      ATC patient. LVM to return my call.

## 2022-10-07 ENCOUNTER — Telehealth: Payer: Self-pay | Admitting: Podiatry

## 2022-10-07 NOTE — Telephone Encounter (Signed)
Pt wanting a refill on pain medication. And wants to know a what else can she do outside of medication for pain. Pt wanting Consult for sx  Please advise

## 2022-10-07 NOTE — Telephone Encounter (Signed)
Patient is aware of results and voiced her understanding.  Nothing further needed.   

## 2022-10-08 ENCOUNTER — Ambulatory Visit: Payer: Medicaid Other | Admitting: Podiatry

## 2022-10-08 DIAGNOSIS — M2041 Other hammer toe(s) (acquired), right foot: Secondary | ICD-10-CM | POA: Diagnosis not present

## 2022-10-08 DIAGNOSIS — Z01818 Encounter for other preprocedural examination: Secondary | ICD-10-CM | POA: Diagnosis not present

## 2022-10-08 DIAGNOSIS — M24574 Contracture, right foot: Secondary | ICD-10-CM

## 2022-10-08 MED ORDER — OXYCODONE-ACETAMINOPHEN 5-325 MG PO TABS: 1.0000 | ORAL_TABLET | ORAL | 0 refills | Status: DC | PRN

## 2022-10-08 NOTE — Progress Notes (Signed)
Subjective:  Patient ID: Norma [name], female    DOB: 1976-05-05,  MRN: [id number]  No chief complaint on file.   47 y.o. female presents with the above complaint.  Patient presents with follow-up of right second digit hammertoe contracture.  She states still painful has not gotten better she would like to discuss treatment options for it.  Denies any other acute complaints   Review of Systems: Negative except as noted in the HPI. Denies N/V/F/Ch.  Past Medical History:  Diagnosis Date   AKI (acute kidney injury) ([place]) 06/04/2016   Anxiety    Bacteremia 06/04/2016   Choledocholithiasis 03/01/2020   Cholelithiasis 03/02/2020   COPD (chronic obstructive pulmonary disease) ([place])    Depression    Diarrhea 06/04/2016   History of kidney stones    Hypertension    Hypokalemia 06/04/2016   IBS (irritable bowel syndrome)    Nausea and vomiting 06/04/2016   Positive blood culture 06/04/2016   Rectal fissure 10/04/2015   Surgery, elective 09/22/2019    Current Outpatient Medications:    oxyCODONE-acetaminophen (PERCOCET) 5-325 MG tablet, Take 1 tablet by mouth every 4 (four) hours as needed for severe pain., Disp: 30 tablet, Rfl: 0   ADVAIR DISKUS 250-50 MCG/ACT AEPB, Inhale 1 puff into the lungs 2 (two) times daily., Disp: , Rfl:    albuterol (VENTOLIN HFA) 108 (90 Base) MCG/ACT inhaler, Inhale 2 puffs into the lungs every 6 (six) hours as needed for wheezing or shortness of breath., Disp: 8 g, Rfl: 2   amLODipine (NORVASC) 5 MG tablet, Take 5 mg by mouth daily. (Patient not taking: Reported on 01/03/2022), Disp: , Rfl:    ARIPiprazole (ABILIFY) 2 MG tablet, Take 2 mg by mouth daily., Disp: , Rfl:    clonazePAM (KLONOPIN) 1 MG tablet, Take 1 mg by mouth 3 (three) times daily as needed., Disp: , Rfl:    doxepin (SINEQUAN) 10 MG capsule, Take 10 mg by mouth at bedtime., Disp: , Rfl:    FLUoxetine (PROZAC) 20 MG capsule, Take 20 mg by mouth daily., Disp: , Rfl:    fluticasone  (FLONASE) 50 MCG/ACT nasal spray, Place 1 spray into both nostrils daily., Disp: , Rfl:    HYDROcodone-acetaminophen (NORCO) 5-325 MG tablet, Take 1 tablet by mouth every 6 (six) hours as needed for moderate pain., Disp: 10 tablet, Rfl: 0   lansoprazole (PREVACID) 30 MG capsule, Take 30 mg by mouth 2 (two) times daily., Disp: , Rfl:    lisinopril (ZESTRIL) 20 MG tablet, Take 20 mg by mouth daily., Disp: , Rfl:    nystatin cream (MYCOSTATIN), Apply 1 Application topically as needed., Disp: , Rfl:    oxybutynin (DITROPAN) 5 MG tablet, Take 1 tablet (5 mg total) by mouth every 8 (eight) hours as needed for bladder spasms., Disp: 30 tablet, Rfl: 0   oxyCODONE-acetaminophen (PERCOCET) 5-325 MG tablet, Take 1 tablet by mouth every 4 (four) hours as needed for severe pain., Disp: 30 tablet, Rfl: 0   oxyCODONE-acetaminophen (PERCOCET) 5-325 MG tablet, Take 1 tablet by mouth every 4 (four) hours as needed for severe pain., Disp: 30 tablet, Rfl: 0   oxyCODONE-acetaminophen (PERCOCET) 5-325 MG tablet, Take 1 tablet by mouth every 4 (four) hours as needed for severe pain., Disp: 30 tablet, Rfl: 0   oxyCODONE-acetaminophen (PERCOCET) 5-325 MG tablet, Take 1 tablet by mouth every 4 (four) hours as needed for severe pain., Disp: 10 tablet, Rfl: 0   SPIRIVA RESPIMAT 1.25 MCG/ACT AERS, 2 puffs daily (Patient not  taking: Reported on 09/24/2022), Disp: , Rfl:    tamsulosin (FLOMAX) 0.4 MG CAPS capsule, Take 1 capsule (0.4 mg total) by mouth daily., Disp: 30 capsule, Rfl: 0   UBRELVY 100 MG TABS, Take 1 tablet by mouth as needed., Disp: , Rfl:   Social History   Tobacco Use  Smoking Status Former   Packs/day: 2.00   Years: 30.00   Additional pack years: 0.00   Total pack years: 60.00   Types: Cigarettes   Quit date: 07/15/2022   Years since quitting: 0.2  Smokeless Tobacco Never    Allergies  Allergen Reactions   Sulfa Antibiotics    Objective:  There were no vitals filed for this visit. There is no  height or weight on file to calculate BMI. Constitutional Well developed. Well nourished.  Vascular Dorsalis pedis pulses palpable bilaterally. Posterior tibial pulses palpable bilaterally. Capillary refill normal to all digits.  No cyanosis or clubbing noted. Pedal hair growth normal.  Neurologic Normal speech. Oriented to person, place, and time. Epicritic sensation to light touch grossly present bilaterally.  Dermatologic Nails well groomed and normal in appearance. No open wounds. No skin lesions.  Orthopedic: Pain on palpation of right second digit hammertoe contracture noted semiflexible deformity noted.  Capsulitis noted at the PIPJ joint of the right second toe pain on palpation.  Possible joint contracture noted at the second metatarsophalangeal joint   Radiographs: None Assessment:   1. Hammertoe of second toe of right foot   2. Joint contracture of foot, right   3. Encounter for preoperative examination for general surgical procedure     Plan:  Patient was evaluated and treated and all questions answered.  Right second PIPJ joint capsulitis with underlying hammertoe contracture -All questions and concerns were discussed with the patient in extensive detail -Clinically she has failed all Conservative care at this time patient will benefit from hammertoe correction of right second digit with possible capsulotomy of the second metatarsal phalangeal joint I discussed my preoperative intraoperative postop plan in extensive detail she states understanding and would like to proceed with surgery with K wire fixation. -Informed surgical risk consent was reviewed and read aloud to the patient.  I reviewed the films.  I have discussed my findings with the patient in great detail.  I have discussed all risks including but not limited to infection, stiffness, scarring, limp, disability, deformity, damage to blood vessels and nerves, numbness, poor healing, need for braces, arthritis,  chronic pain, amputation, death.  All benefits and realistic expectations discussed in great detail.  I have made no promises as to the outcome.  I have provided realistic expectations.  I have offered the patient a 2nd opinion, which they have declined and assured me they preferred to proceed despite the risks    No follow-ups on file.

## 2022-10-15 ENCOUNTER — Other Ambulatory Visit: Payer: Self-pay | Admitting: Podiatry

## 2022-10-16 ENCOUNTER — Telehealth: Payer: Self-pay

## 2022-10-17 ENCOUNTER — Ambulatory Visit
Admission: RE | Admit: 2022-10-17 | Discharge: 2022-10-17 | Disposition: A | Discharge status: Home / Self Care | Payer: Medicaid Other | Source: Ambulatory Visit | Attending: Urology | Admitting: Urology

## 2022-10-17 DIAGNOSIS — N200 Calculus of kidney: Secondary | ICD-10-CM | POA: Diagnosis present

## 2022-10-22 ENCOUNTER — Telehealth: Payer: Self-pay | Admitting: Urology

## 2022-10-22 NOTE — Telephone Encounter (Signed)
DOS - 11/18/22  CAPSULOTOMY MPJ RELEASE JOINT 2ND RIGHT --- 28270 HAMMERTOE REPAIR 2ND RIGHT --- 28285  [employer]   PER [employer] WEBSITE FOR CPT CODES 33007 AND [phone] the authorization requirement is: No, prior authorization is not required. If you are a participating provider, no precertification is required when the service is performed in an outpatient setting.

## 2022-10-23 ENCOUNTER — Other Ambulatory Visit: Payer: Self-pay | Admitting: Podiatry

## 2022-10-25 NOTE — Telephone Encounter (Signed)
No further evaluation is needed 

## 2022-10-28 ENCOUNTER — Other Ambulatory Visit: Payer: Self-pay | Admitting: Podiatry

## 2022-10-29 ENCOUNTER — Ambulatory Visit (INDEPENDENT_AMBULATORY_CARE_PROVIDER_SITE_OTHER): Payer: Medicaid Other | Admitting: Urology

## 2022-10-29 ENCOUNTER — Other Ambulatory Visit: Payer: Self-pay | Admitting: Urology

## 2022-10-29 ENCOUNTER — Encounter: Payer: Self-pay | Admitting: Urology

## 2022-10-29 VITALS — BP 123/82 | HR 72 | Ht 61.0 in | Wt 180.0 lb

## 2022-10-29 DIAGNOSIS — N3080 Other cystitis without hematuria: Secondary | ICD-10-CM

## 2022-10-29 DIAGNOSIS — Z87442 Personal history of urinary calculi: Secondary | ICD-10-CM

## 2022-10-29 DIAGNOSIS — N200 Calculus of kidney: Secondary | ICD-10-CM

## 2022-10-29 NOTE — Progress Notes (Signed)
I, Norma [name],acting as a scribe for Norma Scotland, MD.,have documented all relevant documentation on the behalf of Norma Scotland, MD,as directed by  Norma Scotland, MD while in the presence of Norma Scotland, MD.  10/29/2022 2:40 PM   Norma [name] Jun 05, 1976 [id number]  Referring provider: [name], MD [address] [place],  [place] [address]  Chief Complaint  Patient presents with   Follow-up    HPI: 47 year-old female with a personal history of renal stones presents today for a follow-up.  She recently underwent bladder biopsy, right ureteroscopy, and laser lithotripsy for a non-obstructing right renal pelvic stone. 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 Her stent was left on a tether.   She followed up with a renal ultrasound that showed no hydronephrosis bilaterally. There was sequela biopsy with thickening of the right bladder wall which was anticipated.  She reports having back pain which is unchanged from preop.   Her urinary symptoms include urgency, frequency, hematuria, lower abdominal pain. She said they are improving some.   PMH: Past Medical History:  Diagnosis Date   AKI (acute kidney injury) 06/04/2016   Anxiety    Bacteremia 06/04/2016   Choledocholithiasis 03/01/2020   Cholelithiasis 03/02/2020   COPD (chronic obstructive pulmonary disease)    Depression    Diarrhea 06/04/2016   History of kidney stones    Hypertension    Hypokalemia 06/04/2016   IBS (irritable bowel syndrome)    Nausea and vomiting 06/04/2016   Positive blood culture 06/04/2016   Rectal fissure 10/04/2015   Surgery, elective 09/22/2019    Surgical History: Past Surgical History:  Procedure Laterality Date   ABDOMINAL HYSTERECTOMY N/A 11/09/2015   Procedure: HYSTERECTOMY ABDOMINAL;  Surgeon: Tilda Burrow, MD;  Location: [facility];  Service: Gynecology;  Laterality: N/A;   CHOLECYSTECTOMY N/A 03/03/2020   Procedure:  LAPAROSCOPIC CHOLECYSTECTOMY;  Surgeon: Franky Macho, MD;  Location: [facility];  Service: General;  Laterality: N/A;   COLONOSCOPY WITH PROPOFOL N/A 03/29/2021   Procedure: COLONOSCOPY WITH PROPOFOL;  Surgeon: Pasty Spillers, MD;  Location: [facility] ENDOSCOPY;  Service: Endoscopy;  Laterality: N/A;   COLONOSCOPY WITH PROPOFOL N/A 09/05/2021   Procedure: COLONOSCOPY WITH PROPOFOL;  Surgeon: Toney Reil, MD;  Location[facility] END[facility];  Service: Gastroenterology;  Laterality: N/A;   CYSTOSCOPY WITH BIOPSY N/A 09/30/2022   Procedure: CYSTOSCOPY WITH BLADDER BIOPSY;  Surgeon: Norma Scotland, MD;  Location: [facility];  Service: Urology;  Laterality: N/A;   CYSTOSCOPY/URETEROSCOPY/HOLMIUM LASER/STENT PLACEMENT Right 09/30/2022   Procedure: CYSTOSCOPY/URETEROSCOPY/HOLMIUM LASER/STENT PLACEMENT;  Surgeon: Norma Scotland, MD;  Location: [facility];  Service: Urology;  Laterality: Right;   DILATION AND CURETTAGE OF UTERUS     ESOPHAGOGASTRODUODENOSCOPY (EGD) WITH PROPOFOL N/A 09/05/2021   Procedure: ESOPHAGOGASTRODUODENOSCOPY (EGD) WITH PROPOFOL;  Surgeon: Toney Reil, MD;  Location: [facility] ENDOSCOPY;  Service: Gastroenterology;  Laterality: N/A;   SALPINGOOPHORECTOMY Bilateral 11/09/2015   Procedure: SALPINGO OOPHORECTOMY;  Surgeon: Tilda Burrow, MD;  Location: [facility];  Service: Gynecology;  Laterality: Bilateral;    Home Medications:  Allergies as of 10/29/2022       Reactions   Sulfa Antibiotics         Medication List        Accurate as of October 29, 2022  2:40 PM. If you have any questions, ask your nurse or doctor.          Advair Diskus 250-50 MCG/ACT Aepb Generic drug: fluticasone-salmeterol Inhale 1 puff into  the lungs 2 (two) times daily.   albuterol 108 (90 Base) MCG/ACT inhaler Commonly known as: VENTOLIN HFA Inhale 2 puffs into the lungs every 6 (six) hours as needed for wheezing or shortness of breath.   amLODipine 5 MG tablet Commonly known as: NORVASC Take 5  mg by mouth daily.   ARIPiprazole 2 MG tablet Commonly known as: ABILIFY Take 2 mg by mouth daily.   clonazePAM 1 MG tablet Commonly known as: KLONOPIN Take 1 mg by mouth 3 (three) times daily as needed.   doxepin 10 MG capsule Commonly known as: SINEQUAN Take 10 mg by mouth at bedtime.   FLUoxetine 20 MG capsule Commonly known as: PROZAC Take 20 mg by mouth daily.   fluticasone 50 MCG/ACT nasal spray Commonly known as: FLONASE Place 1 spray into both nostrils daily.   HYDROcodone-acetaminophen 5-325 MG tablet Commonly known as: Norco Take 1 tablet by mouth every 6 (six) hours as needed for moderate pain.   lansoprazole 30 MG capsule Commonly known as: PREVACID Take 30 mg by mouth 2 (two) times daily.   lisinopril 20 MG tablet Commonly known as: ZESTRIL Take 20 mg by mouth daily.   nystatin cream Commonly known as: MYCOSTATIN Apply 1 Application topically as needed.   oxybutynin 5 MG tablet Commonly known as: DITROPAN Take 1 tablet (5 mg total) by mouth every 8 (eight) hours as needed for bladder spasms.   oxyCODONE-acetaminophen 5-325 MG tablet Commonly known as: Percocet Take 1 tablet by mouth every 4 (four) hours as needed for severe pain.   oxyCODONE-acetaminophen 5-325 MG tablet Commonly known as: Percocet Take 1 tablet by mouth every 4 (four) hours as needed for severe pain.   oxyCODONE-acetaminophen 5-325 MG tablet Commonly known as: Percocet Take 1 tablet by mouth every 4 (four) hours as needed for severe pain.   oxyCODONE-acetaminophen 5-325 MG tablet Commonly known as: Percocet Take 1 tablet by mouth every 4 (four) hours as needed for severe pain.   oxyCODONE-acetaminophen 5-325 MG tablet Commonly known as: PERCOCET/ROXICET TAKE ONE TABLET BY MOUTH EVERY FOUR HOURS AS NEEDED FOR SEVERE PAIN.   Spiriva Respimat 1.25 MCG/ACT Aers Generic drug: Tiotropium Bromide Monohydrate 2 puffs daily   tamsulosin 0.4 MG Caps capsule Commonly known as:  Flomax Take 1 capsule (0.4 mg total) by mouth daily.   Ubrelvy 100 MG Tabs Generic drug: Ubrogepant Take 1 tablet by mouth as needed.        Allergies:  Allergies  Allergen Reactions   Sulfa Antibiotics     Family History: Family History  Problem Relation Age of Onset   COPD Mother    Stroke Father    Diabetes Father    Hypertension Father    Heart disease Sister    Seizures Maternal Aunt    Heart disease Maternal Aunt    Birth defects Daughter    Kidney disease Daughter    Bladder Cancer Neg Hx    Kidney cancer Neg Hx     Social History:  reports that she quit smoking about 3 months ago. Her smoking use included cigarettes. She has a 60.00 pack-year smoking history. She has never used smokeless tobacco. She reports that she does not drink alcohol and does not use drugs.   Physical Exam: BP 123/82   Pulse 72   Ht  (1.549 m)   Wt 180 lb (81.6 kg)   LMP 09/24/2015   BMI 34.01 kg/m   Constitutional:  Alert and oriented, No acute distress. HEENT:   AT, moist mucus membranes.  Trachea midline, no masses. Neurologic: Grossly intact, no focal deficits, moving all 4 extremities. Psychiatric: Normal mood and affect.   Pertinent Imaging:    Component 4 wk ago  SURGICAL PATHOLOGY SURGICAL PATHOLOGY CASE: [phone] PATIENT: [facility] Surgical Pathology Report     Specimen Submitted: A. Bladder, right posterior  Clinical History: Right nephrolithiasis, bladder erythema, microscopic hematuria      DIAGNOSIS: A.  BLADDER, RIGHT POSTERIOR; BIOPSY: - UROTHELIAL MUCOSA WITH FOLLICULAR CYSTITIS, ACUTELY INFLAMED UROTHELIUM, AND VASCULAR CONGESTION. - NEGATIVE FOR MALIGNANCY. - DEEPER SECTIONS EXAMINED.  GROSS DESCRIPTION: A. Labeled: Right posterior bladder biopsy Received: Fresh Collection time: 11:35 AM on 09/30/2022 Placed into formalin time: 12:15 PM on 09/30/2022 Tissue fragment(s): 2 Size: Range from 0.3-0.5 cm Description:  Received on a Telfa pad are 2 fragments of tan-pink soft tissue. Entirely submitted in 1 cassette.  RB 09/30/2022  Final Diagnosis performed by Elijah Birk, MD.   Electronically signed 10/01/2022 11:44:30AM The electronic signature indicates that the named Attending Pathologist has evaluated the specimen      Results for orders placed during the hospital encounter of 10/17/22  Ultrasound renal complete  Narrative CLINICAL DATA:  Right Nephrolithiasis. Recent bladder biopsy in March which demonstrated follicular cystitis.  EXAM: RENAL / URINARY TRACT ULTRASOUND COMPLETE  COMPARISON:  September 06, 2022.  FINDINGS: Right Kidney:  Renal measurements: 9.2 x 4.4 x 4.7 cm = volume: 101 mL. Echogenicity within normal limits. No mass or hydronephrosis visualized. Echogenic focus with associated twinkle artifact consistent with known nonobstructing nephrolithiasis in the inferior pole the RIGHT kidney measures approximately 7 mm. There is an additional smaller adjacent echogenic focus with twinkle artifact consistent with a nonobstructing nephrolithiasis.  Left Kidney:  Renal measurements: 9.5 x 4.9 x 4.9 cm = volume: 119 mL. Echogenicity within normal limits. No mass or hydronephrosis visualized. There is an echogenic focus with associated twinkle artifact in the superior pole of the LEFT kidney which measures approximately 7 mm.  Bladder:  There is a focal hypoechoic area of luminal irregularity and thickening along the RIGHT bladder wall which measures 9 x 9 x 8 mm.  Other:  None.  IMPRESSION: 1. There is a focal hypoechoic area of luminal irregularity and thickening along the RIGHT bladder wall which measures 9 x 9 x 8 mm. This may reflect the sequela of recent biopsy demonstrating follicular cystitis. Recommend correlation with urinalysis and recent cystoscopy results. 2. No evidence of hydronephrosis. 3. Bilateral nonobstructing nephrolithiasis.       Electronically Signed By: Meda Klinefelter M.D. On: 10/19/2022 11:02 Personally reviewed and agree with radiologic interpretation.   Assessment & Plan:    Kidney stone  - S/p lithotripsy. No evidence of stones per the post-op imaging.   - Still has back pain but since stone is gone it is most likely unrelated. Recommended she drink more water and gave her the stone prevention booklet.  2. Follicular cystitis -benign -symptoms improving  Return in about 1 year (around 10/29/2023) for UA, xray.  I have reviewed the above documentation for accuracy and completeness, and I agree with the above.   Norma Scotland, MD  [facility] [address], [address] [place], [place] [address] [phone]

## 2022-10-31 ENCOUNTER — Ambulatory Visit: Payer: Medicaid Other | Attending: Pulmonary Disease

## 2022-10-31 DIAGNOSIS — R0609 Other forms of dyspnea: Secondary | ICD-10-CM | POA: Insufficient documentation

## 2022-10-31 DIAGNOSIS — J449 Chronic obstructive pulmonary disease, unspecified: Secondary | ICD-10-CM

## 2022-10-31 DIAGNOSIS — J4489 Other specified chronic obstructive pulmonary disease: Secondary | ICD-10-CM | POA: Insufficient documentation

## 2022-10-31 DIAGNOSIS — R059 Cough, unspecified: Secondary | ICD-10-CM | POA: Diagnosis not present

## 2022-10-31 DIAGNOSIS — Z87891 Personal history of nicotine dependence: Secondary | ICD-10-CM | POA: Diagnosis not present

## 2022-10-31 LAB — PULMONARY FUNCTION TEST ARMC ONLY
DL/VA % pred: 80 %
DL/VA: 3.57 ml/min/mmHg/L
DLCO unc % pred: 82 %
DLCO unc: 15.97 ml/min/mmHg
FEF 25-75 Post: 2.28 L/sec
FEF 25-75 Pre: 1.56 L/sec
FEF2575-%Change-Post: 46 %
FEF2575-%Pred-Post: 83 %
FEF2575-%Pred-Pre: 56 %
FEV1-%Change-Post: 15 %
FEV1-%Pred-Post: 80 %
FEV1-%Pred-Pre: 70 %
FEV1-Post: 2.11 L
FEV1-Pre: 1.84 L
FEV1FVC-%Change-Post: 9 %
FEV1FVC-%Pred-Pre: 90 %
FEV6-%Change-Post: 4 %
FEV6-%Pred-Post: 82 %
FEV6-%Pred-Pre: 79 %
FEV6-Post: 2.63 L
FEV6-Pre: 2.51 L
FEV6FVC-%Pred-Post: 102 %
FEV6FVC-%Pred-Pre: 102 %
FVC-%Change-Post: 4 %
FVC-%Pred-Post: 80 %
FVC-%Pred-Pre: 77 %
FVC-Post: 2.63 L
FVC-Pre: 2.51 L
Post FEV1/FVC ratio: 80 %
Post FEV6/FVC ratio: 100 %
Pre FEV1/FVC ratio: 73 %
Pre FEV6/FVC Ratio: 100 %
RV % pred: 131 %
RV: 2.05 L
TLC % pred: 105 %
TLC: 4.86 L

## 2022-10-31 MED ORDER — ALBUTEROL SULFATE (2.5 MG/3ML) 0.083% IN NEBU
2.5000 mg | INHALATION_SOLUTION | Freq: Once | RESPIRATORY_TRACT | Status: AC
  Administered 2022-10-31: 2.5 mg via RESPIRATORY_TRACT

## 2022-11-04 ENCOUNTER — Ambulatory Visit (INDEPENDENT_AMBULATORY_CARE_PROVIDER_SITE_OTHER): Payer: Medicaid Other | Admitting: Pulmonary Disease

## 2022-11-04 ENCOUNTER — Encounter: Payer: Self-pay | Admitting: Pulmonary Disease

## 2022-11-04 VITALS — BP 110/70 | HR 71 | Temp 98.0°F | Ht 61.0 in | Wt 177.4 lb

## 2022-11-04 DIAGNOSIS — R911 Solitary pulmonary nodule: Secondary | ICD-10-CM

## 2022-11-04 DIAGNOSIS — J301 Allergic rhinitis due to pollen: Secondary | ICD-10-CM

## 2022-11-04 DIAGNOSIS — J4489 Other specified chronic obstructive pulmonary disease: Secondary | ICD-10-CM | POA: Diagnosis not present

## 2022-11-04 DIAGNOSIS — I2510 Atherosclerotic heart disease of native coronary artery without angina pectoris: Secondary | ICD-10-CM

## 2022-11-04 DIAGNOSIS — Z87891 Personal history of nicotine dependence: Secondary | ICD-10-CM

## 2022-11-04 DIAGNOSIS — I2584 Coronary atherosclerosis due to calcified coronary lesion: Secondary | ICD-10-CM

## 2022-11-04 NOTE — Progress Notes (Signed)
Subjective:    Patient ID: Norma [name], female    DOB: Jan 09, 1976, 47 y.o.   MRN: [id number] Patient Care Team: Girtha Rm Eli Phillips, MD as PCP - General [facility])  Chief Complaint  Patient presents with   Follow-up    SOB with exertion. No wheezing. Occasional dry cough.   HPI Patient is a 47 year old recent former smoker with a 60-pack-year history of smoking and a history as noted below who presents for follow-up of a lung nodule found incidentally on imaging performed 06 September 2022 for hematuria workup.  Recall that the patient was being evaluated for microscopic hematuria and underwent a CT abdomen pelvis on 06 September 2022.  On the lower lung windows noted on the CT she was noted to have a 12 x 6 mm groundglass nodule in the posterior right lower lobe.  This was new from prior imaging of 27 Nov 2021.  We initially saw the patient on 24 September 2022 and ordered a dedicated CT chest.  She had this study performed 01 October 2022 and showed that the lung nodule was no longer identified.  This was noted to be either infectious or inflammatory.  She also was noted to have coronary artery calcifications.  She does not endorse any fevers, chills or sweats.  Previously her cough had been noted to be productive but notes no sputum production of late.  No wheezing.  He also has noted dyspnea on exertion for over the last several years.  She notes that inhalers do help her with the symptoms.  She is currently on Advair and Spiriva.  She had pulmonary function testing performed on 31 October 2022 and this showed an FEV1 of 1.84 L or 70% predicted, FVC of 2.51 L or 77% predicted, FEV1/FVC of 73%, there was significant bronchodilator response with 15% net change on FEV1 decreased airway resistance, diffusion capacity was normal limits normal.  She has not had any chest pain.  No orthopnea or paroxysmal nocturnal dyspnea.  No lower extremity edema or calf tenderness.  Had some increased nasal congestion  associated with seasonal change/allergies.   Review of Systems A 10 point review of systems was performed and it is as noted above otherwise negative.  Patient Active Problem List   Diagnosis Date Noted   Duodenal ulcer    Gastric erosion    Personal history of colonic polyps    Family history of colon cancer requiring screening colonoscopy    [name] 1 obesity due to excess calories without serious comorbidity with body mass index (BMI) of 34.0 to 34.9 in adult    Tobacco abuse    Chronic obstructive pulmonary disease    HTN (hypertension) 03/01/2020   Chronic low back pain (1ry area of Pain) (Bilateral) (L>R) w/o sciatica 09/22/2019   Cervicalgia 09/22/2019   Chronic neck pain (2ry area of Pain) (Bilateral) (L>R) 09/22/2019   Chronic lower extremity pain (3ry area of Pain) (Bilateral) (L>R) 09/22/2019   Chronic knee pain (Bilateral) (L>R) 09/22/2019   Chronic shoulder pain (Right) 09/22/2019   Chronic pain syndrome 09/22/2019   Disorder of skeletal system 09/22/2019   Problems influencing health status 09/22/2019   UTI (urinary tract infection) 06/04/2016   Abdominal pain, epigastric 06/04/2016   AKI (acute kidney injury) 06/04/2016   Endometriosis of pelvis 11/16/2015   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 11/09/2015   Social History   Tobacco Use   Smoking status: Former    Packs/day: 2.00    Years:  30.00    Additional pack years: 0.00    Total pack years: 60.00    Types: Cigarettes    Quit date: 07/15/2022    Years since quitting: 0.3   Smokeless tobacco: Never  Substance Use Topics   Alcohol use: No    Alcohol/week: 0.0 standard drinks of alcohol   Allergies  Allergen Reactions   Sulfa Antibiotics    Social History   Tobacco Use   Smoking status: Former    Packs/day: 2.00    Years: 30.00    Additional pack years: 0.00    Total pack years: 60.00    Types: Cigarettes    Quit date: 07/15/2022    Years since quitting:  0.3   Smokeless tobacco: Never  Substance Use Topics   Alcohol use: No    Alcohol/week: 0.0 standard drinks of alcohol       Objective:   Physical Exam BP 110/70 (BP Location: Left Arm, Cuff Size: Normal)   Pulse 71   Temp 98 F (36.7 C)   Ht  (1.549 m)   Wt 177 lb 6.4 oz (80.5 kg)   LMP 09/24/2015   SpO2 96%   BMI 33.52 kg/m   SpO2: 96 % O2 Device: None (Room air)  GENERAL: Well-developed, overweight woman, fully ambulatory.  No conversational dyspnea.  Flat affect. HEAD: Normocephalic, atraumatic.  EYES: Pupils equal, round, reactive to light.  No scleral icterus.  MOUTH: Poor dentition with chipped teeth.  Oral mucosa moist.  No thrush. NECK: Supple. No thyromegaly. Trachea midline. No JVD.  No adenopathy. PULMONARY: Good air entry bilaterally.  Coarse, otherwise, no adventitious sounds. CARDIOVASCULAR: S1 and S2. Regular rate and rhythm.  No rubs, murmurs or gallops heard. ABDOMEN: Benign. MUSCULOSKELETAL: No joint deformity, no clubbing, no edema.  NEUROLOGIC: No overt focal deficit. SKIN: Intact,warm,dry. PSYCH: Flat affect, behavior normal.  Representative images from CT performed 06 September 2022 showing the groundglass nodule on the right lower lobe (arrow) and same area on dedicated chest CT 01 October 2022 showing resolution of this finding:      Assessment & Plan:     ICD-10-CM   1. Asthma-COPD overlap syndrome  J44.89    Lung function more consistent with asthma No Advair and Spiriva Continue as needed albuterol    2. Lung nodule seen on imaging study  R91.1    RESOLVED Likely inflammatory/infectious    3. Seasonal allergic rhinitis due to pollen  J30.1    Zyrtec as needed    4. Coronary artery calcification  I25.10    I25.84    Patient asymptomatic Recommend discuss with primary physician    5. Former smoker  Z87.891    No evidence of relapse     See the patient in follow-up in 3 to 4 months time call sooner should any new problems  arise.  Gailen Shelter, MD Advanced Bronchoscopy PCCM [facility]    *This note was dictated using voice recognition software/Dragon.  Despite best efforts to proofread, errors can occur which can change the meaning. Any transcriptional errors that result from this process are unintentional and may not be fully corrected at the time of dictation.

## 2022-11-04 NOTE — Patient Instructions (Signed)
We discussed that your breathing test were more consistent with asthma.  Continue taking Advair and Spiriva as you are doing.  The previously noted lung nodule has resolved.  When you turn 47 years of age she will be eligible for lung cancer screening program.  If your allergies flareup you can use Zyrtec over-the-counter 1 tablet at bedtime.  We will see you in follow-up in 3 to 4 months time call sooner should any new problems arise.

## 2022-11-06 ENCOUNTER — Other Ambulatory Visit: Payer: Self-pay | Admitting: Podiatry

## 2022-11-08 ENCOUNTER — Telehealth: Payer: Self-pay | Admitting: Podiatry

## 2022-11-08 MED ORDER — OXYCODONE-ACETAMINOPHEN 5-325 MG PO TABS: 1.0000 | ORAL_TABLET | ORAL | 0 refills | Status: DC | PRN

## 2022-11-08 NOTE — Telephone Encounter (Signed)
Pt calling requesting Oxy refill  Please advise

## 2022-11-11 ENCOUNTER — Other Ambulatory Visit: Payer: Self-pay | Admitting: Podiatry

## 2022-11-12 NOTE — Telephone Encounter (Signed)
Patient is calling for pain medicine  refill, oxycodone, previous prescription is not helping,called patient and left a message for clarification on which medication is not helping.

## 2022-11-15 ENCOUNTER — Telehealth: Payer: Self-pay

## 2022-11-15 NOTE — Telephone Encounter (Signed)
Patient called in for a refill request of Percocet

## 2022-11-15 NOTE — Telephone Encounter (Signed)
Spoke with Dr. Allena Katz and he will not refill the prescription at this time due to the medication was refilled last Friday 11/08/22. Patient has been advised to take tylenol and or ibuprofen for pain every 4-6 hours if needed.

## 2022-11-18 ENCOUNTER — Encounter: Payer: Self-pay | Admitting: Podiatry

## 2022-11-18 ENCOUNTER — Other Ambulatory Visit: Payer: Self-pay | Admitting: Podiatry

## 2022-11-18 DIAGNOSIS — M2041 Other hammer toe(s) (acquired), right foot: Secondary | ICD-10-CM | POA: Diagnosis not present

## 2022-11-18 MED ORDER — IBUPROFEN 800 MG PO TABS: 800.0000 mg | ORAL_TABLET | Freq: Four times a day (QID) | ORAL | 1 refills | Status: DC | PRN

## 2022-11-18 MED ORDER — OXYCODONE-ACETAMINOPHEN 5-325 MG PO TABS: 1.0000 | ORAL_TABLET | ORAL | 0 refills | Status: DC | PRN

## 2022-11-21 NOTE — Telephone Encounter (Signed)
Patient is requesting refill on medication. A request for refill was submitted on 11/08/22

## 2022-11-25 ENCOUNTER — Other Ambulatory Visit: Payer: Self-pay | Admitting: Podiatry

## 2022-11-25 NOTE — Telephone Encounter (Signed)
Please refill medication if you approve. Thanks

## 2022-11-25 NOTE — Telephone Encounter (Signed)
Patient called asking for her oxycodone to be refilled at [facility].

## 2022-11-26 ENCOUNTER — Ambulatory Visit (INDEPENDENT_AMBULATORY_CARE_PROVIDER_SITE_OTHER): Payer: Medicaid Other

## 2022-11-26 ENCOUNTER — Ambulatory Visit (INDEPENDENT_AMBULATORY_CARE_PROVIDER_SITE_OTHER): Payer: Medicaid Other | Admitting: Podiatry

## 2022-11-26 ENCOUNTER — Encounter: Payer: Self-pay | Admitting: Podiatry

## 2022-11-26 ENCOUNTER — Ambulatory Visit: Payer: Medicaid Other

## 2022-11-26 ENCOUNTER — Telehealth: Payer: Self-pay | Admitting: Podiatry

## 2022-11-26 DIAGNOSIS — M2041 Other hammer toe(s) (acquired), right foot: Secondary | ICD-10-CM | POA: Diagnosis not present

## 2022-11-26 DIAGNOSIS — Z9889 Other specified postprocedural states: Secondary | ICD-10-CM

## 2022-11-26 MED ORDER — DOXYCYCLINE HYCLATE 100 MG PO TABS: 100.0000 mg | ORAL_TABLET | Freq: Two times a day (BID) | ORAL | 0 refills | Status: DC

## 2022-11-26 NOTE — Telephone Encounter (Signed)
Pt called stating you told her you would call her in some pain medication and when she went to the pharmacy they did not have it. I did see the doxycycline was sent in and told her that was sent in.

## 2022-11-27 MED ORDER — OXYCODONE-ACETAMINOPHEN 5-325 MG PO TABS: 1.0000 | ORAL_TABLET | ORAL | 0 refills | Status: DC | PRN

## 2022-11-27 MED ORDER — OXYCODONE-ACETAMINOPHEN 10-325 MG PO TABS: 1.0000 | ORAL_TABLET | ORAL | 0 refills | Status: DC | PRN

## 2022-11-27 NOTE — Addendum Note (Signed)
Addended by: Nicholes Rough on: 11/27/2022 03:36 PM   Modules accepted: Orders

## 2022-11-27 NOTE — Telephone Encounter (Signed)
Pt called back and said that you told her  you would call in the 10mg  but what was called in was 5mg .

## 2022-11-27 NOTE — Telephone Encounter (Signed)
Notified pt and she said ok and thank you.

## 2022-11-30 ENCOUNTER — Other Ambulatory Visit: Payer: Self-pay | Admitting: Podiatry

## 2022-12-04 NOTE — Progress Notes (Signed)
Subjective:  Patient ID: Norma [name], female    DOB: Aug 20, 1975,  MRN: [id number]  Chief Complaint  Patient presents with   Routine Post Op    DOS: 11/18/2022 Procedure: Right second digit hammertoe correction with PIPJ arthroplasty and capsulotomy  47 y.o. female returns for post-op check.  Patient states that she is doing well.  No acute complaints.  Denies any other issues.  She is weightbearing as tolerated in regular shoes.  Review of Systems: Negative except as noted in the HPI. Denies N/V/F/Ch.  Past Medical History:  Diagnosis Date   AKI (acute kidney injury) (HCC) 06/04/2016   Anxiety    Bacteremia 06/04/2016   Choledocholithiasis 03/01/2020   Cholelithiasis 03/02/2020   COPD (chronic obstructive pulmonary disease) (HCC)    Depression    Diarrhea 06/04/2016   History of kidney stones    Hypertension    Hypokalemia 06/04/2016   IBS (irritable bowel syndrome)    Nausea and vomiting 06/04/2016   Positive blood culture 06/04/2016   Rectal fissure 10/04/2015   Surgery, elective 09/22/2019    Current Outpatient Medications:    doxycycline (VIBRA-TABS) 100 MG tablet, Take 1 tablet (100 mg total) by mouth 2 (two) times daily., Disp: 20 tablet, Rfl: 0   ADVAIR DISKUS 250-50 MCG/ACT AEPB, Inhale 1 puff into the lungs 2 (two) times daily., Disp: , Rfl:    albuterol (VENTOLIN HFA) 108 (90 Base) MCG/ACT inhaler, Inhale 2 puffs into the lungs every 6 (six) hours as needed for wheezing or shortness of breath., Disp: 8 g, Rfl: 2   amLODipine (NORVASC) 5 MG tablet, Take 5 mg by mouth daily., Disp: , Rfl:    ARIPiprazole (ABILIFY) 2 MG tablet, Take 2 mg by mouth daily., Disp: , Rfl:    clonazePAM (KLONOPIN) 1 MG tablet, Take 1 mg by mouth 3 (three) times daily as needed., Disp: , Rfl:    doxepin (SINEQUAN) 10 MG capsule, Take 10 mg by mouth at bedtime., Disp: , Rfl:    FLUoxetine (PROZAC) 20 MG capsule, Take 20 mg by mouth daily., Disp: , Rfl:    fluticasone (FLONASE) 50  MCG/ACT nasal spray, Place 1 spray into both nostrils daily., Disp: , Rfl:    HYDROcodone-acetaminophen (NORCO) 5-325 MG tablet, Take 1 tablet by mouth every 6 (six) hours as needed for moderate pain. (Patient not taking: Reported on 11/04/2022), Disp: 10 tablet, Rfl: 0   ibuprofen (ADVIL) 800 MG tablet, TAKE ONE TABLET (800 MG TOTAL) BY MOUTH EVERY SIX HOURS AS NEEDED., Disp: 60 tablet, Rfl: 1   lansoprazole (PREVACID) 30 MG capsule, Take 30 mg by mouth 2 (two) times daily., Disp: , Rfl:    lisinopril (ZESTRIL) 20 MG tablet, Take 20 mg by mouth daily., Disp: , Rfl:    nystatin cream (MYCOSTATIN), Apply 1 Application topically as needed., Disp: , Rfl:    oxybutynin (DITROPAN) 5 MG tablet, Take 1 tablet (5 mg total) by mouth every 8 (eight) hours as needed for bladder spasms., Disp: 30 tablet, Rfl: 0   oxyCODONE-acetaminophen (PERCOCET) 10-325 MG tablet, Take 1 tablet by mouth every 4 (four) hours as needed for pain., Disp: 30 tablet, Rfl: 0   oxyCODONE-acetaminophen (PERCOCET) 5-325 MG tablet, Take 1 tablet by mouth every 4 (four) hours as needed for severe pain., Disp: 30 tablet, Rfl: 0   oxyCODONE-acetaminophen (PERCOCET) 5-325 MG tablet, Take 1 tablet by mouth every 4 (four) hours as needed for severe pain., Disp: 30 tablet, Rfl: 0   oxyCODONE-acetaminophen (PERCOCET) 5-325 MG  tablet, Take 1 tablet by mouth every 4 (four) hours as needed for severe pain., Disp: 30 tablet, Rfl: 0   oxyCODONE-acetaminophen (PERCOCET) 5-325 MG tablet, Take 1 tablet by mouth every 4 (four) hours as needed for severe pain., Disp: 10 tablet, Rfl: 0   oxyCODONE-acetaminophen (PERCOCET) 5-325 MG tablet, Take 1 tablet by mouth every 4 (four) hours as needed for severe pain., Disp: 30 tablet, Rfl: 0   oxyCODONE-acetaminophen (PERCOCET) 5-325 MG tablet, Take 1 tablet by mouth every 4 (four) hours as needed for severe pain., Disp: 30 tablet, Rfl: 0   oxyCODONE-acetaminophen (PERCOCET) 5-325 MG tablet, Take 1 tablet by mouth  every 4 (four) hours as needed for severe pain., Disp: 30 tablet, Rfl: 0   oxyCODONE-acetaminophen (PERCOCET/ROXICET) 5-325 MG tablet, TAKE ONE TABLET BY MOUTH EVERY FOUR HOURS AS NEEDED FOR SEVERE PAIN., Disp: 30 tablet, Rfl: 0   SPIRIVA RESPIMAT 1.25 MCG/ACT AERS, 2 puffs daily, Disp: , Rfl:    tamsulosin (FLOMAX) 0.4 MG CAPS capsule, Take 1 capsule (0.4 mg total) by mouth daily., Disp: 30 capsule, Rfl: 0   UBRELVY 100 MG TABS, Take 1 tablet by mouth as needed., Disp: , Rfl:   Social History   Tobacco Use  Smoking Status Former   Packs/day: 2.00   Years: 30.00   Additional pack years: 0.00   Total pack years: 60.00   Types: Cigarettes   Quit date: 07/15/2022   Years since quitting: 0.3  Smokeless Tobacco Never    Allergies  Allergen Reactions   Sulfa Antibiotics    Objective:  There were no vitals filed for this visit. There is no height or weight on file to calculate BMI. Constitutional Well developed. Well nourished.  Vascular Foot warm and well perfused. Capillary refill normal to all digits.   Neurologic Normal speech. Oriented to person, place, and time. Epicritic sensation to light touch grossly present bilaterally.  Dermatologic Skin healing well without signs of infection. Skin edges well coapted without signs of infection.  Orthopedic: Tenderness to palpation noted about the surgical site.   Radiographs: 3 views of skeletally mature right foot: There is intact no signs of backing or loosening noted.  Good correction alignment noted of the second toe Assessment:   1. Hammertoe of second toe of right foot   2. Status post foot surgery    Plan:  Patient was evaluated and treated and all questions answered.  S/p foot surgery right -Progressing as expected post-operatively. -XR: See above -WB Status: Weightbearing as tolerated in surgical shoe -Sutures: Intact.  No clinical signs of Deis is noted no complication noted. -Medications: None -Foot redressed.  No  follow-ups on file.

## 2022-12-10 ENCOUNTER — Ambulatory Visit (INDEPENDENT_AMBULATORY_CARE_PROVIDER_SITE_OTHER): Payer: Medicaid Other | Admitting: Podiatry

## 2022-12-10 ENCOUNTER — Ambulatory Visit (INDEPENDENT_AMBULATORY_CARE_PROVIDER_SITE_OTHER): Payer: Medicaid Other

## 2022-12-10 ENCOUNTER — Other Ambulatory Visit: Payer: Self-pay | Admitting: Podiatry

## 2022-12-10 DIAGNOSIS — M2041 Other hammer toe(s) (acquired), right foot: Secondary | ICD-10-CM

## 2022-12-10 DIAGNOSIS — Z9889 Other specified postprocedural states: Secondary | ICD-10-CM

## 2022-12-10 MED ORDER — OXYCODONE-ACETAMINOPHEN 10-325 MG PO TABS: 1.0000 | ORAL_TABLET | ORAL | 0 refills | Status: DC | PRN

## 2022-12-10 NOTE — Progress Notes (Signed)
Subjective:  Patient ID: Norma [name], female    DOB: 04/14/1976,  MRN: [id number]  Chief Complaint  Patient presents with   Routine Post Op    DOS: 11/18/2022 Procedure: Right second digit hammertoe correction with PIPJ arthroplasty and capsulotomy  47 y.o. female returns for post-op check.  Patient states that she is doing well.  No acute complaints.  Denies any other issues.  She is weightbearing as tolerated in regular shoes.  Review of Systems: Negative except as noted in the HPI. Denies N/V/F/Ch.  Past Medical History:  Diagnosis Date   AKI (acute kidney injury) (HCC) 06/04/2016   Anxiety    Bacteremia 06/04/2016   Choledocholithiasis 03/01/2020   Cholelithiasis 03/02/2020   COPD (chronic obstructive pulmonary disease) (HCC)    Depression    Diarrhea 06/04/2016   History of kidney stones    Hypertension    Hypokalemia 06/04/2016   IBS (irritable bowel syndrome)    Nausea and vomiting 06/04/2016   Positive blood culture 06/04/2016   Rectal fissure 10/04/2015   Surgery, elective 09/22/2019    Current Outpatient Medications:    oxyCODONE-acetaminophen (PERCOCET) 10-325 MG tablet, Take 1 tablet by mouth every 4 (four) hours as needed for pain., Disp: 30 tablet, Rfl: 0   ADVAIR DISKUS 250-50 MCG/ACT AEPB, Inhale 1 puff into the lungs 2 (two) times daily., Disp: , Rfl:    albuterol (VENTOLIN HFA) 108 (90 Base) MCG/ACT inhaler, Inhale 2 puffs into the lungs every 6 (six) hours as needed for wheezing or shortness of breath., Disp: 8 g, Rfl: 2   amLODipine (NORVASC) 5 MG tablet, Take 5 mg by mouth daily., Disp: , Rfl:    ARIPiprazole (ABILIFY) 2 MG tablet, Take 2 mg by mouth daily., Disp: , Rfl:    clonazePAM (KLONOPIN) 1 MG tablet, Take 1 mg by mouth 3 (three) times daily as needed., Disp: , Rfl:    doxepin (SINEQUAN) 10 MG capsule, Take 10 mg by mouth at bedtime., Disp: , Rfl:    doxycycline (VIBRA-TABS) 100 MG tablet, Take 1 tablet (100 mg total) by mouth 2 (two) times  daily., Disp: 20 tablet, Rfl: 0   FLUoxetine (PROZAC) 20 MG capsule, Take 20 mg by mouth daily., Disp: , Rfl:    fluticasone (FLONASE) 50 MCG/ACT nasal spray, Place 1 spray into both nostrils daily., Disp: , Rfl:    HYDROcodone-acetaminophen (NORCO) 5-325 MG tablet, Take 1 tablet by mouth every 6 (six) hours as needed for moderate pain. (Patient not taking: Reported on 11/04/2022), Disp: 10 tablet, Rfl: 0   ibuprofen (ADVIL) 800 MG tablet, TAKE ONE TABLET (800 MG TOTAL) BY MOUTH EVERY SIX HOURS AS NEEDED., Disp: 60 tablet, Rfl: 1   lansoprazole (PREVACID) 30 MG capsule, Take 30 mg by mouth 2 (two) times daily., Disp: , Rfl:    lisinopril (ZESTRIL) 20 MG tablet, Take 20 mg by mouth daily., Disp: , Rfl:    nystatin cream (MYCOSTATIN), Apply 1 Application topically as needed., Disp: , Rfl:    oxybutynin (DITROPAN) 5 MG tablet, Take 1 tablet (5 mg total) by mouth every 8 (eight) hours as needed for bladder spasms., Disp: 30 tablet, Rfl: 0   oxyCODONE-acetaminophen (PERCOCET) 10-325 MG tablet, Take 1 tablet by mouth every 4 (four) hours as needed for pain., Disp: 30 tablet, Rfl: 0   oxyCODONE-acetaminophen (PERCOCET) 5-325 MG tablet, Take 1 tablet by mouth every 4 (four) hours as needed for severe pain., Disp: 30 tablet, Rfl: 0   oxyCODONE-acetaminophen (PERCOCET) 5-325 MG tablet,  Take 1 tablet by mouth every 4 (four) hours as needed for severe pain., Disp: 30 tablet, Rfl: 0   oxyCODONE-acetaminophen (PERCOCET) 5-325 MG tablet, Take 1 tablet by mouth every 4 (four) hours as needed for severe pain., Disp: 30 tablet, Rfl: 0   oxyCODONE-acetaminophen (PERCOCET) 5-325 MG tablet, Take 1 tablet by mouth every 4 (four) hours as needed for severe pain., Disp: 10 tablet, Rfl: 0   oxyCODONE-acetaminophen (PERCOCET) 5-325 MG tablet, Take 1 tablet by mouth every 4 (four) hours as needed for severe pain., Disp: 30 tablet, Rfl: 0   oxyCODONE-acetaminophen (PERCOCET) 5-325 MG tablet, Take 1 tablet by mouth every 4 (four)  hours as needed for severe pain., Disp: 30 tablet, Rfl: 0   oxyCODONE-acetaminophen (PERCOCET) 5-325 MG tablet, Take 1 tablet by mouth every 4 (four) hours as needed for severe pain., Disp: 30 tablet, Rfl: 0   oxyCODONE-acetaminophen (PERCOCET) 5-325 MG tablet, Take 1 tablet by mouth every 4 (four) hours as needed for severe pain., Disp: 30 tablet, Rfl: 0   oxyCODONE-acetaminophen (PERCOCET/ROXICET) 5-325 MG tablet, TAKE ONE TABLET BY MOUTH EVERY FOUR HOURS AS NEEDED FOR SEVERE PAIN., Disp: 30 tablet, Rfl: 0   SPIRIVA RESPIMAT 1.25 MCG/ACT AERS, 2 puffs daily, Disp: , Rfl:    tamsulosin (FLOMAX) 0.4 MG CAPS capsule, Take 1 capsule (0.4 mg total) by mouth daily., Disp: 30 capsule, Rfl: 0   UBRELVY 100 MG TABS, Take 1 tablet by mouth as needed., Disp: , Rfl:   Social History   Tobacco Use  Smoking Status Former   Packs/day: 2.00   Years: 30.00   Additional pack years: 0.00   Total pack years: 60.00   Types: Cigarettes   Quit date: 07/15/2022   Years since quitting: 0.4  Smokeless Tobacco Never    Allergies  Allergen Reactions   Sulfa Antibiotics    Objective:  There were no vitals filed for this visit. There is no height or weight on file to calculate BMI. Constitutional Well developed. Well nourished.  Vascular Foot warm and well perfused. Capillary refill normal to all digits.   Neurologic Normal speech. Oriented to person, place, and time. Epicritic sensation to light touch grossly present bilaterally.  Dermatologic Skin completely reepithelialized.  No signs of Deis is noted no complication noted.  Good correction alignment of the hammertoe noted  Orthopedic: Tenderness to palpation noted about the surgical site.   Radiographs: 3 views of skeletally mature right foot: There is intact no signs of backing or loosening noted.  Good correction alignment noted of the second toe Assessment:   1. Hammertoe of second toe of right foot   2. Status post foot surgery    Plan:   Patient was evaluated and treated and all questions answered.  S/p foot surgery right -Progressing as expected post-operatively. -XR: See above -WB Status: Weightbearing as tolerated in surgical shoe -Sutures: Removed no clinical signs of Deis is noted no complication noted. -Medications: None -Patient has a broken pin in the second metatarsophalangeal joint.  I discussed with the patient that they may need a removal if it irritates before now majority the pain is well buried within the medullary canal of the second metatarsophalangeal joint.  I will continue to clinically monitor and if it is bothersome.  She states understanding and agrees with the plan  No follow-ups on file.

## 2022-12-16 ENCOUNTER — Other Ambulatory Visit: Payer: Self-pay | Admitting: Podiatry

## 2022-12-16 ENCOUNTER — Telehealth: Payer: Self-pay | Admitting: Podiatry

## 2022-12-16 NOTE — Telephone Encounter (Signed)
Patient called asking for a refill of her oxycodone to be sent to [facility],

## 2022-12-17 ENCOUNTER — Other Ambulatory Visit: Payer: Self-pay | Admitting: Podiatry

## 2022-12-17 MED ORDER — OXYCODONE-ACETAMINOPHEN 5-325 MG PO TABS: 1.0000 | ORAL_TABLET | ORAL | 0 refills | Status: DC | PRN

## 2022-12-23 ENCOUNTER — Other Ambulatory Visit: Payer: Self-pay | Admitting: Podiatry

## 2022-12-24 ENCOUNTER — Telehealth: Payer: Self-pay | Admitting: Podiatry

## 2022-12-24 NOTE — Telephone Encounter (Signed)
Pt requested a Rx for pain. Please advise

## 2022-12-25 MED ORDER — OXYCODONE-ACETAMINOPHEN 10-325 MG PO TABS: 1.0000 | ORAL_TABLET | ORAL | 0 refills | Status: DC | PRN

## 2022-12-30 ENCOUNTER — Other Ambulatory Visit: Payer: Self-pay | Admitting: Podiatry

## 2023-01-01 ENCOUNTER — Telehealth: Payer: Self-pay | Admitting: Podiatry

## 2023-01-01 MED ORDER — OXYCODONE-ACETAMINOPHEN 5-325 MG PO TABS: 1.0000 | ORAL_TABLET | ORAL | 0 refills | Status: DC | PRN

## 2023-01-01 NOTE — Addendum Note (Signed)
Addended by: Nicholes Rough on: 01/01/2023 09:47 AM   Modules accepted: Orders

## 2023-01-01 NOTE — Telephone Encounter (Signed)
Patient calling to request refill of pain medication

## 2023-01-07 ENCOUNTER — Telehealth: Payer: Self-pay | Admitting: Podiatry

## 2023-01-07 NOTE — Telephone Encounter (Signed)
Pt calling for a refill on her pain medication. She asked for the 10mg  as the 5mg  is not working for the pain.She states the metal in her foot is killing her. She asked to be seen sooner as well but he is not in the office but 1 day this week in [place] and she has an appt 7.2.2024 in [place].

## 2023-01-07 NOTE — Telephone Encounter (Signed)
Patient called asking for a pain medication refill. Let pt know Dr Allena Katz was not in office today but will CC Dr Logan Bores who is on call. Pt would like RX sent to [facility] pharmacy.

## 2023-01-08 ENCOUNTER — Other Ambulatory Visit: Payer: Self-pay | Admitting: Podiatry

## 2023-01-08 MED ORDER — OXYCODONE-ACETAMINOPHEN 10-325 MG PO TABS: 1.0000 | ORAL_TABLET | ORAL | 0 refills | Status: DC | PRN

## 2023-01-08 MED ORDER — OXYCODONE-ACETAMINOPHEN 5-325 MG PO TABS: 1.0000 | ORAL_TABLET | ORAL | 0 refills | Status: DC | PRN

## 2023-01-08 NOTE — Telephone Encounter (Signed)
Per Dr Patel RX sent to pharmacy 

## 2023-01-12 ENCOUNTER — Other Ambulatory Visit: Payer: Self-pay | Admitting: Podiatry

## 2023-01-13 MED ORDER — OXYCODONE-ACETAMINOPHEN 10-325 MG PO TABS: 1.0000 | ORAL_TABLET | ORAL | 0 refills | Status: DC | PRN

## 2023-01-13 NOTE — Addendum Note (Signed)
Addended by: Nicholes Rough on: 01/13/2023 08:49 AM   Modules accepted: Orders

## 2023-01-13 NOTE — Telephone Encounter (Signed)
Left message for pt that medication was sent into pharmacy and to call if any issues.

## 2023-01-14 ENCOUNTER — Ambulatory Visit (INDEPENDENT_AMBULATORY_CARE_PROVIDER_SITE_OTHER): Payer: MEDICAID | Admitting: Podiatry

## 2023-01-14 DIAGNOSIS — M2041 Other hammer toe(s) (acquired), right foot: Secondary | ICD-10-CM

## 2023-01-14 DIAGNOSIS — Z9889 Other specified postprocedural states: Secondary | ICD-10-CM

## 2023-01-14 NOTE — Progress Notes (Signed)
Subjective:  Patient ID: Norma [name], female    DOB: 1976/01/10,  MRN: [id number]  Chief Complaint  Patient presents with   Routine Post Op    DOS: 11/18/2022 Procedure: Right second digit hammertoe correction with PIPJ arthroplasty and capsulotomy  47 y.o. female returns for post-op check.  Patient states that she is doing well.  No acute complaints.  Denies any other issues.  She is weightbearing as tolerated in regular shoes.  Review of Systems: Negative except as noted in the HPI. Denies N/V/F/Ch.  Past Medical History:  Diagnosis Date   AKI (acute kidney injury) (HCC) 06/04/2016   Anxiety    Bacteremia 06/04/2016   Choledocholithiasis 03/01/2020   Cholelithiasis 03/02/2020   COPD (chronic obstructive pulmonary disease) (HCC)    Depression    Diarrhea 06/04/2016   History of kidney stones    Hypertension    Hypokalemia 06/04/2016   IBS (irritable bowel syndrome)    Nausea and vomiting 06/04/2016   Positive blood culture 06/04/2016   Rectal fissure 10/04/2015   Surgery, elective 09/22/2019    Current Outpatient Medications:    ADVAIR DISKUS 250-50 MCG/ACT AEPB, Inhale 1 puff into the lungs 2 (two) times daily., Disp: , Rfl:    albuterol (VENTOLIN HFA) 108 (90 Base) MCG/ACT inhaler, Inhale 2 puffs into the lungs every 6 (six) hours as needed for wheezing or shortness of breath., Disp: 8 g, Rfl: 2   amLODipine (NORVASC) 5 MG tablet, Take 5 mg by mouth daily., Disp: , Rfl:    ARIPiprazole (ABILIFY) 2 MG tablet, Take 2 mg by mouth daily., Disp: , Rfl:    clonazePAM (KLONOPIN) 1 MG tablet, Take 1 mg by mouth 3 (three) times daily as needed., Disp: , Rfl:    doxepin (SINEQUAN) 10 MG capsule, Take 10 mg by mouth at bedtime., Disp: , Rfl:    doxycycline (VIBRA-TABS) 100 MG tablet, Take 1 tablet (100 mg total) by mouth 2 (two) times daily., Disp: 20 tablet, Rfl: 0   FLUoxetine (PROZAC) 20 MG capsule, Take 20 mg by mouth daily., Disp: , Rfl:    fluticasone (FLONASE) 50  MCG/ACT nasal spray, Place 1 spray into both nostrils daily., Disp: , Rfl:    HYDROcodone-acetaminophen (NORCO) 5-325 MG tablet, Take 1 tablet by mouth every 6 (six) hours as needed for moderate pain. (Patient not taking: Reported on 11/04/2022), Disp: 10 tablet, Rfl: 0   ibuprofen (ADVIL) 800 MG tablet, TAKE ONE TABLET (800 MG TOTAL) BY MOUTH EVERY SIX HOURS AS NEEDED., Disp: 60 tablet, Rfl: 1   lansoprazole (PREVACID) 30 MG capsule, Take 30 mg by mouth 2 (two) times daily., Disp: , Rfl:    lisinopril (ZESTRIL) 20 MG tablet, Take 20 mg by mouth daily., Disp: , Rfl:    nystatin cream (MYCOSTATIN), Apply 1 Application topically as needed., Disp: , Rfl:    oxybutynin (DITROPAN) 5 MG tablet, Take 1 tablet (5 mg total) by mouth every 8 (eight) hours as needed for bladder spasms., Disp: 30 tablet, Rfl: 0   oxyCODONE-acetaminophen (PERCOCET) 10-325 MG tablet, Take 1 tablet by mouth every 4 (four) hours as needed for pain., Disp: 30 tablet, Rfl: 0   oxyCODONE-acetaminophen (PERCOCET) 10-325 MG tablet, Take 1 tablet by mouth every 4 (four) hours as needed for pain., Disp: 30 tablet, Rfl: 0   oxyCODONE-acetaminophen (PERCOCET) 10-325 MG tablet, Take 1 tablet by mouth every 4 (four) hours as needed for pain., Disp: 30 tablet, Rfl: 0   oxyCODONE-acetaminophen (PERCOCET) 10-325 MG tablet, Take  1 tablet by mouth every 4 (four) hours as needed for pain., Disp: 30 tablet, Rfl: 0   oxyCODONE-acetaminophen (PERCOCET) 10-325 MG tablet, Take 1 tablet by mouth every 4 (four) hours as needed for pain., Disp: 30 tablet, Rfl: 0   oxyCODONE-acetaminophen (PERCOCET) 5-325 MG tablet, Take 1 tablet by mouth every 4 (four) hours as needed for severe pain., Disp: 30 tablet, Rfl: 0   oxyCODONE-acetaminophen (PERCOCET) 5-325 MG tablet, Take 1 tablet by mouth every 4 (four) hours as needed for severe pain., Disp: 30 tablet, Rfl: 0   oxyCODONE-acetaminophen (PERCOCET) 5-325 MG tablet, Take 1 tablet by mouth every 4 (four) hours as  needed for severe pain., Disp: 30 tablet, Rfl: 0   oxyCODONE-acetaminophen (PERCOCET) 5-325 MG tablet, Take 1 tablet by mouth every 4 (four) hours as needed for severe pain., Disp: 10 tablet, Rfl: 0   oxyCODONE-acetaminophen (PERCOCET) 5-325 MG tablet, Take 1 tablet by mouth every 4 (four) hours as needed for severe pain., Disp: 30 tablet, Rfl: 0   oxyCODONE-acetaminophen (PERCOCET) 5-325 MG tablet, Take 1 tablet by mouth every 4 (four) hours as needed for severe pain., Disp: 30 tablet, Rfl: 0   oxyCODONE-acetaminophen (PERCOCET) 5-325 MG tablet, Take 1 tablet by mouth every 4 (four) hours as needed for severe pain., Disp: 30 tablet, Rfl: 0   oxyCODONE-acetaminophen (PERCOCET) 5-325 MG tablet, Take 1 tablet by mouth every 4 (four) hours as needed for severe pain., Disp: 30 tablet, Rfl: 0   oxyCODONE-acetaminophen (PERCOCET) 5-325 MG tablet, Take 1 tablet by mouth every 4 (four) hours as needed for severe pain., Disp: 30 tablet, Rfl: 0   oxyCODONE-acetaminophen (PERCOCET) 5-325 MG tablet, Take 1 tablet by mouth every 4 (four) hours as needed for severe pain., Disp: 30 tablet, Rfl: 0   oxyCODONE-acetaminophen (PERCOCET) 5-325 MG tablet, Take 1 tablet by mouth every 4 (four) hours as needed for severe pain., Disp: 30 tablet, Rfl: 0   oxyCODONE-acetaminophen (PERCOCET/ROXICET) 5-325 MG tablet, TAKE ONE TABLET BY MOUTH EVERY FOUR HOURS AS NEEDED FOR SEVERE PAIN., Disp: 30 tablet, Rfl: 0   SPIRIVA RESPIMAT 1.25 MCG/ACT AERS, 2 puffs daily, Disp: , Rfl:    tamsulosin (FLOMAX) 0.4 MG CAPS capsule, Take 1 capsule (0.4 mg total) by mouth daily., Disp: 30 capsule, Rfl: 0   UBRELVY 100 MG TABS, Take 1 tablet by mouth as needed., Disp: , Rfl:   Social History   Tobacco Use  Smoking Status Former   Current packs/day: 0.00   Average packs/day: 2.0 packs/day for 30.0 years (60.0 ttl pk-yrs)   Types: Cigarettes   Start date: 07/15/1992   Quit date: 07/15/2022   Years since quitting: 0.5  Smokeless Tobacco Never     Allergies  Allergen Reactions   Sulfa Antibiotics    Objective:  There were no vitals filed for this visit. There is no height or weight on file to calculate BMI. Constitutional Well developed. Well nourished.  Vascular Foot warm and well perfused. Capillary refill normal to all digits.   Neurologic Normal speech. Oriented to person, place, and time. Epicritic sensation to light touch grossly present bilaterally.  Dermatologic Skin completely reepithelialized.  No signs of Deis is noted no complication noted.  Good correction alignment of the hammertoe noted  Orthopedic: Tenderness to palpation noted about the surgical site.   Radiographs: 3 views of skeletally mature right foot: There is intact no signs of backing or loosening noted.  Good correction alignment noted of the second toe Assessment:   No diagnosis found.  Plan:  Patient was evaluated and treated and all questions answered.  S/p foot surgery right -Clinically healed and officially discharged from my care.  If any foot and ankle issues on future she will come back and see me.  She will think about removal of the broken hardware as it may be causing her some pain.  Denies any other acute complaints.  No follow-ups on file.

## 2023-01-15 ENCOUNTER — Other Ambulatory Visit: Payer: Self-pay | Admitting: Podiatry

## 2023-01-22 ENCOUNTER — Other Ambulatory Visit: Payer: Self-pay | Admitting: Podiatry

## 2023-01-22 MED ORDER — OXYCODONE-ACETAMINOPHEN 5-325 MG PO TABS: 1.0000 | ORAL_TABLET | ORAL | 0 refills | Status: DC | PRN

## 2023-01-24 ENCOUNTER — Other Ambulatory Visit: Payer: Self-pay | Admitting: Podiatry

## 2023-01-24 ENCOUNTER — Telehealth: Payer: Self-pay | Admitting: Podiatry

## 2023-01-24 MED ORDER — OXYCODONE-ACETAMINOPHEN 10-325 MG PO TABS: 1.0000 | ORAL_TABLET | ORAL | 0 refills | Status: DC | PRN

## 2023-01-24 NOTE — Telephone Encounter (Signed)
Pt requested a stronger dosage for pain Rx; she states the 5mg  is not helping her pain. Please advise.

## 2023-01-30 ENCOUNTER — Telehealth: Payer: Self-pay | Admitting: Podiatry

## 2023-01-30 MED ORDER — OXYCODONE-ACETAMINOPHEN 10-325 MG PO TABS: 1.0000 | ORAL_TABLET | ORAL | 0 refills | Status: DC | PRN

## 2023-01-30 NOTE — Telephone Encounter (Signed)
Pt called stating she was told you were to call in some pain medication for her and the pharmacy has not gotten the rx yet.

## 2023-02-04 ENCOUNTER — Other Ambulatory Visit: Payer: Self-pay | Admitting: Podiatry

## 2023-02-04 DIAGNOSIS — M2041 Other hammer toe(s) (acquired), right foot: Secondary | ICD-10-CM

## 2023-02-05 ENCOUNTER — Telehealth: Payer: Self-pay

## 2023-02-05 MED ORDER — OXYCODONE-ACETAMINOPHEN 10-325 MG PO TABS: 1.0000 | ORAL_TABLET | ORAL | 0 refills | Status: DC | PRN

## 2023-02-05 NOTE — Telephone Encounter (Signed)
Patient called on 02/04/23, asking for another refill of pain medicine to get her through until she can be scheduled with pain management. Please advise, thanks!

## 2023-02-05 NOTE — Addendum Note (Signed)
Addended by: Nicholes Rough on: 02/05/2023 09:45 AM   Modules accepted: Orders

## 2023-02-10 ENCOUNTER — Encounter: Payer: Self-pay | Admitting: Podiatry

## 2023-02-10 ENCOUNTER — Telehealth (INDEPENDENT_AMBULATORY_CARE_PROVIDER_SITE_OTHER): Payer: MEDICAID | Admitting: Podiatry

## 2023-02-10 MED ORDER — OXYCODONE-ACETAMINOPHEN 10-325 MG PO TABS: 1.0000 | ORAL_TABLET | Freq: Three times a day (TID) | ORAL | 0 refills | Status: AC | PRN

## 2023-02-10 NOTE — Telephone Encounter (Signed)
Requesting pain medication refill of oxycodone 10 mg. [facility]. Per patient, last refill was last week.

## 2023-02-11 ENCOUNTER — Telehealth: Payer: Self-pay | Admitting: Podiatry

## 2023-02-11 ENCOUNTER — Other Ambulatory Visit: Payer: Self-pay | Admitting: Podiatry

## 2023-02-11 DIAGNOSIS — R52 Pain, unspecified: Secondary | ICD-10-CM

## 2023-02-11 MED ORDER — OXYCODONE-ACETAMINOPHEN 10-325 MG PO TABS: 1.0000 | ORAL_TABLET | ORAL | 0 refills | Status: DC | PRN

## 2023-02-11 NOTE — Telephone Encounter (Signed)
Pt requesting for her Rx Oxycodone to have a stronger dose the ones she has now isnt doing  anything for her her pain.   Please advise.

## 2023-02-11 NOTE — Telephone Encounter (Signed)
Pt wants refill on pain medication

## 2023-02-17 ENCOUNTER — Encounter: Payer: Self-pay | Admitting: Pulmonary Disease

## 2023-02-17 ENCOUNTER — Telehealth: Payer: Self-pay | Admitting: Podiatry

## 2023-02-17 ENCOUNTER — Ambulatory Visit (INDEPENDENT_AMBULATORY_CARE_PROVIDER_SITE_OTHER): Payer: MEDICAID | Admitting: Pulmonary Disease

## 2023-02-17 VITALS — BP 120/74 | HR 67 | Temp 97.8°F | Ht 61.0 in | Wt 190.2 lb

## 2023-02-17 DIAGNOSIS — R911 Solitary pulmonary nodule: Secondary | ICD-10-CM

## 2023-02-17 DIAGNOSIS — J4489 Other specified chronic obstructive pulmonary disease: Secondary | ICD-10-CM

## 2023-02-17 NOTE — Patient Instructions (Signed)
Congratulations on quitting smoking!  Continue using Advair twice a day.  Continue as needed albuterol.  We will see her in follow-up in 6 months time call sooner should any new problems arise.

## 2023-02-17 NOTE — Telephone Encounter (Signed)
Pt requested a Rx refill

## 2023-02-17 NOTE — Progress Notes (Signed)
Subjective:    Patient ID: Norma [name], female    DOB: 11-02-1975, 47 y.o.   MRN: [id number]  Patient Care Team: Girtha Rm Eli Phillips, MD as PCP - General (Family Medicine) Salena Saner, MD as Consulting Physician (Pulmonary Disease)  Chief Complaint  Patient presents with   Follow-up    DOE. Occasional wheezing and dry cough.    HPI Patient is a 47 year old former smoker with a 60-pack-year history of smoking and a history as noted below who presents for follow-up of a lung nodule found incidentally on imaging performed for hematuria workup.  We last saw the patient on 04 November 2022 and at that time she appeared well compensated.  Recall that the patient was being evaluated for microscopic hematuria and underwent a CT abdomen pelvis on 06 September 2022.  On the lower lung windows noted on the CT she was noted to have a 12 x 6 mm groundglass nodule in the posterior right lower lobe.  This was new from prior imaging of 27 Nov 2021.  We initially saw the patient on 24 September 2022 and ordered a dedicated CT chest.  She had this study performed 01 October 2022 and showed that the lung nodule was no longer identified.  This was noted to be either infectious or inflammatory.  She also was noted to have coronary artery calcifications.  She does not endorse any fevers, chills or sweats.  Previously her cough had been noted to be productive but notes no sputum production of late.  No wheezing.  She also has noted dyspnea on exertion for over the last several years.  She notes that inhalers do help her with the symptoms.  She is currently on Advair and Spiriva.  She notes good control of her symptoms with these medications.  She has not had any chest pain.  No orthopnea or paroxysmal nocturnal dyspnea.  No lower extremity edema or calf tenderness.  She does not endorse any other symptomatology.  Overall she feels well and looks well.  DATA 09/06/2022 CT hematuria workup: With new pulmonary nodule seen  in the posterior right lower lobe measuring 12 x 6 mm.  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  Bilateral nephro axis, no other acute finding. 10/01/2022 CT chest without contrast: Resolution of previous semisolid right lower lobe nodule no longer identified.  Likely had been infectious or inflammatory etiology.  No new lung nodule identified. 10/31/2022 PFTs: FEV1 1.84 L or 70% predicted, FVC 2.51 L or 77% predicted, FEV1/FVC 73%, there is significant bronchodilator response.  Lung volumes show air trapping.  Diffusion capacity normal.  Consistent with moderate obstructive airways disease, asthmatic type.  Review of Systems A 10 point review of systems was performed and it is as noted above otherwise negative.   Patient Active Problem List   Diagnosis Date Noted   Duodenal ulcer    Gastric erosion    Personal history of colonic polyps    Family history of colon cancer requiring screening colonoscopy    [name] 1 obesity due to excess calories without serious comorbidity with body mass index (BMI) of 34.0 to 34.9 in adult    Tobacco abuse    Chronic obstructive pulmonary disease (HCC)    HTN (hypertension) 03/01/2020   Chronic low back pain (1ry area of Pain) (Bilateral) (L>R) w/o sciatica 09/22/2019   Cervicalgia 09/22/2019   Chronic neck pain (2ry area of Pain) (Bilateral) (L>R) 09/22/2019   Chronic lower extremity pain (3ry area of Pain) (Bilateral) (  L>R) 09/22/2019   Chronic knee pain (Bilateral) (L>R) 09/22/2019   Chronic shoulder pain (Right) 09/22/2019   Chronic pain syndrome 09/22/2019   Disorder of skeletal system 09/22/2019   Problems influencing health status 09/22/2019   UTI (urinary tract infection) 06/04/2016   Abdominal pain, epigastric 06/04/2016   AKI (acute kidney injury) (HCC) 06/04/2016   Endometriosis of pelvis 11/16/2015   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   albuterol (VENTOLIN HFA) 108 (90 Base) MCG/ACT inhaler Inhale 2 puffs into the lungs every 6 (six) hours as needed for wheezing or shortness of breath.   amLODipine (NORVASC) 5 MG tablet Take 5 mg by mouth daily.   ARIPiprazole (ABILIFY) 2 MG tablet Take 2 mg by mouth daily.   clonazePAM (KLONOPIN) 1 MG tablet Take 1 mg by mouth 3 (three) times daily as needed.   doxepin (SINEQUAN) 10 MG capsule Take 10 mg by mouth at bedtime.   FLUoxetine (PROZAC) 20 MG capsule Take 20 mg by mouth daily.   fluticasone (FLONASE) 50 MCG/ACT nasal spray Place 1 spray into both nostrils daily.   ibuprofen (ADVIL) 800 MG tablet TAKE ONE TABLET (800 MG TOTAL) BY MOUTH EVERY SIX HOURS AS NEEDED.   lansoprazole (PREVACID) 30 MG capsule Take 30 mg by mouth 2 (two) times daily.   lisinopril (ZESTRIL) 20 MG tablet Take 20 mg by mouth daily.   nystatin cream (MYCOSTATIN) Apply 1 Application topically as needed.   oxybutynin (DITROPAN) 5 MG tablet Take 1 tablet (5 mg total) by mouth every 8 (eight) hours as needed for bladder spasms.   oxyCODONE-acetaminophen (PERCOCET) 10-325 MG tablet Take 1 tablet by mouth every 4 (four) hours as needed for pain.   oxyCODONE-acetaminophen (PERCOCET) 10-325 MG tablet Take 1 tablet by mouth every 4 (four) hours as needed for pain.   oxyCODONE-acetaminophen (PERCOCET) 10-325 MG tablet Take 1 tablet by mouth every 4 (four) hours as needed for pain.   oxyCODONE-acetaminophen (PERCOCET) 10-325 MG  tablet Take 1 tablet by mouth every 4 (four) hours as needed for pain.   oxyCODONE-acetaminophen (PERCOCET) 10-325 MG tablet Take 1 tablet by mouth every 4 (four) hours as needed for pain.   oxyCODONE-acetaminophen (PERCOCET) 10-325 MG tablet Take 1 tablet by mouth every 4 (four) hours as needed for pain.   oxyCODONE-acetaminophen (PERCOCET) 10-325 MG tablet Take 1 tablet by mouth every 4 (four) hours as needed for pain.   oxyCODONE-acetaminophen (PERCOCET) 10-325 MG tablet Take 1 tablet by mouth every 4 (four) hours as needed for pain.   oxyCODONE-acetaminophen (PERCOCET) 10-325 MG tablet Take 1 tablet by mouth every 4 (four) hours as needed for pain.   oxyCODONE-acetaminophen (PERCOCET) 5-325 MG tablet Take 1 tablet by mouth every 4 (four) hours as needed for severe pain.   oxyCODONE-acetaminophen (PERCOCET) 5-325 MG tablet  Take 1 tablet by mouth every 4 (four) hours as needed for severe pain.   oxyCODONE-acetaminophen (PERCOCET) 5-325 MG tablet Take 1 tablet by mouth every 4 (four) hours as needed for severe pain.   oxyCODONE-acetaminophen (PERCOCET) 5-325 MG tablet Take 1 tablet by mouth every 4 (four) hours as needed for severe pain.   oxyCODONE-acetaminophen (PERCOCET) 5-325 MG tablet Take 1 tablet by mouth every 4 (four) hours as needed for severe pain.   oxyCODONE-acetaminophen (PERCOCET) 5-325 MG tablet Take 1 tablet by mouth every 4 (four) hours as needed for severe pain.   oxyCODONE-acetaminophen (PERCOCET) 5-325 MG tablet Take 1 tablet by mouth every 4 (four) hours as needed for severe pain.   oxyCODONE-acetaminophen (PERCOCET) 5-325 MG tablet Take 1 tablet by mouth every 4 (four) hours as needed for severe pain.   oxyCODONE-acetaminophen (PERCOCET) 5-325 MG tablet Take 1 tablet by mouth every 4 (four) hours as needed for severe pain.   oxyCODONE-acetaminophen (PERCOCET) 5-325 MG tablet Take 1 tablet by mouth every 4 (four) hours as needed for severe pain.   oxyCODONE-acetaminophen  (PERCOCET) 5-325 MG tablet Take 1 tablet by mouth every 4 (four) hours as needed for severe pain.   oxyCODONE-acetaminophen (PERCOCET/ROXICET) 5-325 MG tablet TAKE ONE TABLET BY MOUTH EVERY FOUR HOURS AS NEEDED FOR SEVERE PAIN.   SPIRIVA RESPIMAT 1.25 MCG/ACT AERS 2 puffs daily   tamsulosin (FLOMAX) 0.4 MG CAPS capsule Take 1 capsule (0.4 mg total) by mouth daily.   UBRELVY 100 MG TABS Take 1 tablet by mouth as needed.    There is no immunization history for the selected administration types on file for this patient.      Objective:   BP 120/74 (BP Location: Right Arm, Cuff Size: Normal)   Pulse 67   Temp 97.8 F (36.6 C)   Ht 5\' 1"  (1.549 m)   Wt 190 lb 3.2 oz (86.3 kg)   LMP 09/24/2015   SpO2 99%   BMI 35.94 kg/m   SpO2: 99 % O2 Device: None (Room air)  GENERAL: Well-developed, overweight woman, fully ambulatory.  No conversational dyspnea.  Flat affect. HEAD: Normocephalic, atraumatic.  EYES: Pupils equal, round, reactive to light.  No scleral icterus.  MOUTH: Poor dentition with chipped teeth.  Oral mucosa moist.  No thrush. NECK: Supple. No thyromegaly. Trachea midline. No JVD.  No adenopathy. PULMONARY: Good air entry bilaterally.  Coarse, otherwise, no adventitious sounds. CARDIOVASCULAR: S1 and S2. Regular rate and rhythm.  No rubs, murmurs or gallops heard. ABDOMEN: Benign. MUSCULOSKELETAL: No joint deformity, no clubbing, no edema.  NEUROLOGIC: No overt focal deficit. SKIN: Intact,warm,dry. PSYCH: Flat affect, behavior normal.    Assessment & Plan:     ICD-10-CM   1. Asthma-COPD overlap syndrome  J44.89    Well compensated on Advair and Spiriva Continue same Continue as needed albuterol    2. Lung nodule seen on imaging study - RESOLVED  R91.1    RESOLVED Likely infectious/inflammatory     Will see the patient in follow-up in 6 months time call sooner should any new problems arise.   Gailen Shelter, MD Advanced Bronchoscopy [facility] [place]  [facility]    *This note was dictated using voice recognition software/Dragon.  Despite best efforts to proofread, errors can occur which can change the meaning. Any transcriptional errors that result from this process are unintentional and may not be fully corrected at the time of dictation.

## 2023-02-18 ENCOUNTER — Telehealth: Payer: Self-pay | Admitting: Podiatry

## 2023-02-18 NOTE — Telephone Encounter (Signed)
pt would like some pain medication

## 2023-02-18 NOTE — Telephone Encounter (Signed)
Pt called asking for a refill on her pain medication.  Please let me know and I can notify pt

## 2023-02-18 NOTE — Telephone Encounter (Signed)
Left message that Dr Allena Katz stated it was just filled on 7.30 only 6 days ago so he is going to hold off on refilling the medication to next week.

## 2023-02-19 ENCOUNTER — Telehealth: Payer: Self-pay | Admitting: Podiatry

## 2023-02-19 ENCOUNTER — Other Ambulatory Visit: Payer: Self-pay | Admitting: Podiatry

## 2023-02-19 NOTE — Telephone Encounter (Signed)
Pt called following up on her request for her pain medication.

## 2023-02-19 NOTE — Telephone Encounter (Signed)
Pt requested a Rx refill

## 2023-02-20 ENCOUNTER — Telehealth: Payer: Self-pay | Admitting: Podiatry

## 2023-02-20 NOTE — Telephone Encounter (Signed)
Pt requested a Rx refill for pain. Please advise

## 2023-02-20 NOTE — Telephone Encounter (Signed)
Received call from [facility] at Dr Jerald Kief office pts pcp and she has called them asking for pain medication because she is having withdrawls from not having the pain medication.  I explained that Dr Allena Katz said he would not refill this week as she just got a refill on 7.30 quantity of 30.Marland KitchenHe said he would refill them next week  Lauris Poag asked about when she got them last and I listed all the times she has gotten them in July and she said thank you.

## 2023-02-20 NOTE — Telephone Encounter (Signed)
Pt called back crying stating she needs her medication or help because she is in pain and is going thru withdrawals from not having the pain medication. She is not understanding why he will not refill the medication since she has not gotten it since last week.  I did tell her she has gotten a lot of pain medication in the last month and it is his license. She said ok. I told her I was not sure if Dr Allena Katz was still in the office but I would send him the message.

## 2023-02-20 NOTE — Telephone Encounter (Signed)
Pt called again about her pain medication refill. I had left her a message earlier this week that Dr Allena Katz said he was not going to call in any this week as she just got some on 7.30.2024. He will call some in next week.  She stated she is in pain and does not understand why he will not call them in. I explained that he is the doctor and we have to go by what he says. She asked when next week and I told her he did not say when

## 2023-02-24 ENCOUNTER — Telehealth: Payer: Self-pay | Admitting: Podiatry

## 2023-02-24 MED ORDER — OXYCODONE-ACETAMINOPHEN 5-325 MG PO TABS: 1.0000 | ORAL_TABLET | ORAL | 0 refills | Status: DC | PRN

## 2023-02-24 NOTE — Telephone Encounter (Signed)
Patient wanted to be sure that you were going to send in her refill for her pain medication.

## 2023-02-24 NOTE — Addendum Note (Signed)
Addended by: Nicholes Rough on: 02/24/2023 11:57 AM   Modules accepted: Orders

## 2023-02-27 ENCOUNTER — Telehealth: Payer: Self-pay | Admitting: Podiatry

## 2023-02-27 NOTE — Telephone Encounter (Signed)
Pt would like refill on her pain medication and pt wants 10mg  instead of 5mg 

## 2023-03-03 ENCOUNTER — Telehealth: Payer: Self-pay | Admitting: Podiatry

## 2023-03-03 NOTE — Telephone Encounter (Signed)
Patient called asking for refill of pain medication but wants 10mg  instead.

## 2023-03-04 ENCOUNTER — Other Ambulatory Visit: Payer: Self-pay | Admitting: Podiatry

## 2023-03-04 MED ORDER — OXYCODONE-ACETAMINOPHEN 10-325 MG PO TABS: 1.0000 | ORAL_TABLET | ORAL | 0 refills | Status: DC | PRN

## 2023-03-10 ENCOUNTER — Telehealth: Payer: Self-pay

## 2023-03-10 NOTE — Telephone Encounter (Signed)
Patient is calling for refill of pain medication

## 2023-03-11 ENCOUNTER — Other Ambulatory Visit: Payer: Self-pay | Admitting: Podiatry

## 2023-03-11 ENCOUNTER — Ambulatory Visit: Payer: MEDICAID | Admitting: Podiatry

## 2023-03-11 DIAGNOSIS — Z01818 Encounter for other preprocedural examination: Secondary | ICD-10-CM

## 2023-03-11 DIAGNOSIS — M2042 Other hammer toe(s) (acquired), left foot: Secondary | ICD-10-CM

## 2023-03-11 MED ORDER — OXYCODONE-ACETAMINOPHEN 10-325 MG PO TABS: 1.0000 | ORAL_TABLET | ORAL | 0 refills | Status: DC | PRN

## 2023-03-11 NOTE — Progress Notes (Signed)
Subjective:  Patient ID: Norma [name], female    DOB: 20-Jan-1976,  MRN: [id number]  Chief Complaint  Patient presents with   Toe Pain    47 y.o. female presents with the above complaint.  Patient presents with follow-up of new complaints of left second digit hammertoe contracture.  She states still painful has not gotten better she would like to discuss treatment options for it.  Denies any other acute complaints.  The right side is doing okay for now   Review of Systems: Negative except as noted in the HPI. Denies N/V/F/Ch.  Past Medical History:  Diagnosis Date   AKI (acute kidney injury) (HCC) 06/04/2016   Anxiety    Bacteremia 06/04/2016   Choledocholithiasis 03/01/2020   Cholelithiasis 03/02/2020   COPD (chronic obstructive pulmonary disease) (HCC)    Depression    Diarrhea 06/04/2016   History of kidney stones    Hypertension    Hypokalemia 06/04/2016   IBS (irritable bowel syndrome)    Nausea and vomiting 06/04/2016   Positive blood culture 06/04/2016   Rectal fissure 10/04/2015   Surgery, elective 09/22/2019    Current Outpatient Medications:    oxyCODONE-acetaminophen (PERCOCET) 10-325 MG tablet, Take 1 tablet by mouth every 4 (four) hours as needed for pain., Disp: 30 tablet, Rfl: 0   ADVAIR DISKUS 250-50 MCG/ACT AEPB, Inhale 1 puff into the lungs 2 (two) times daily., Disp: , Rfl:    albuterol (VENTOLIN HFA) 108 (90 Base) MCG/ACT inhaler, Inhale 2 puffs into the lungs every 6 (six) hours as needed for wheezing or shortness of breath., Disp: 8 g, Rfl: 2   amLODipine (NORVASC) 5 MG tablet, Take 5 mg by mouth daily., Disp: , Rfl:    ARIPiprazole (ABILIFY) 2 MG tablet, Take 2 mg by mouth daily., Disp: , Rfl:    clonazePAM (KLONOPIN) 1 MG tablet, Take 1 mg by mouth 3 (three) times daily as needed., Disp: , Rfl:    doxepin (SINEQUAN) 10 MG capsule, Take 10 mg by mouth at bedtime., Disp: , Rfl:    doxycycline (VIBRA-TABS) 100 MG tablet, Take 1 tablet (100 mg total)  by mouth 2 (two) times daily. (Patient not taking: Reported on 02/17/2023), Disp: 20 tablet, Rfl: 0   FLUoxetine (PROZAC) 20 MG capsule, Take 20 mg by mouth daily., Disp: , Rfl:    fluticasone (FLONASE) 50 MCG/ACT nasal spray, Place 1 spray into both nostrils daily., Disp: , Rfl:    HYDROcodone-acetaminophen (NORCO) 5-325 MG tablet, Take 1 tablet by mouth every 6 (six) hours as needed for moderate pain. (Patient not taking: Reported on 11/04/2022), Disp: 10 tablet, Rfl: 0   ibuprofen (ADVIL) 800 MG tablet, TAKE ONE TABLET (800 MG TOTAL) BY MOUTH EVERY SIX HOURS AS NEEDED., Disp: 60 tablet, Rfl: 1   lansoprazole (PREVACID) 30 MG capsule, Take 30 mg by mouth 2 (two) times daily., Disp: , Rfl:    lisinopril (ZESTRIL) 20 MG tablet, Take 20 mg by mouth daily., Disp: , Rfl:    nystatin cream (MYCOSTATIN), Apply 1 Application topically as needed., Disp: , Rfl:    oxybutynin (DITROPAN) 5 MG tablet, Take 1 tablet (5 mg total) by mouth every 8 (eight) hours as needed for bladder spasms., Disp: 30 tablet, Rfl: 0   oxyCODONE-acetaminophen (PERCOCET) 10-325 MG tablet, Take 1 tablet by mouth every 4 (four) hours as needed for pain., Disp: 30 tablet, Rfl: 0   oxyCODONE-acetaminophen (PERCOCET) 10-325 MG tablet, Take 1 tablet by mouth every 4 (four) hours as needed for  pain., Disp: 30 tablet, Rfl: 0   oxyCODONE-acetaminophen (PERCOCET) 10-325 MG tablet, Take 1 tablet by mouth every 4 (four) hours as needed for pain., Disp: 30 tablet, Rfl: 0   oxyCODONE-acetaminophen (PERCOCET) 10-325 MG tablet, Take 1 tablet by mouth every 4 (four) hours as needed for pain., Disp: 30 tablet, Rfl: 0   oxyCODONE-acetaminophen (PERCOCET) 10-325 MG tablet, Take 1 tablet by mouth every 4 (four) hours as needed for pain., Disp: 30 tablet, Rfl: 0   oxyCODONE-acetaminophen (PERCOCET) 10-325 MG tablet, Take 1 tablet by mouth every 4 (four) hours as needed for pain., Disp: 30 tablet, Rfl: 0   oxyCODONE-acetaminophen (PERCOCET) 10-325 MG tablet,  Take 1 tablet by mouth every 4 (four) hours as needed for pain., Disp: 30 tablet, Rfl: 0   oxyCODONE-acetaminophen (PERCOCET) 10-325 MG tablet, Take 1 tablet by mouth every 4 (four) hours as needed for pain., Disp: 30 tablet, Rfl: 0   oxyCODONE-acetaminophen (PERCOCET) 10-325 MG tablet, Take 1 tablet by mouth every 4 (four) hours as needed for pain., Disp: 30 tablet, Rfl: 0   oxyCODONE-acetaminophen (PERCOCET) 10-325 MG tablet, Take 1 tablet by mouth every 4 (four) hours as needed for pain., Disp: 30 tablet, Rfl: 0   oxyCODONE-acetaminophen (PERCOCET) 10-325 MG tablet, Take 1 tablet by mouth every 4 (four) hours as needed for pain., Disp: 30 tablet, Rfl: 0   oxyCODONE-acetaminophen (PERCOCET) 5-325 MG tablet, Take 1 tablet by mouth every 4 (four) hours as needed for severe pain., Disp: 30 tablet, Rfl: 0   oxyCODONE-acetaminophen (PERCOCET) 5-325 MG tablet, Take 1 tablet by mouth every 4 (four) hours as needed for severe pain., Disp: 30 tablet, Rfl: 0   oxyCODONE-acetaminophen (PERCOCET) 5-325 MG tablet, Take 1 tablet by mouth every 4 (four) hours as needed for severe pain., Disp: 30 tablet, Rfl: 0   oxyCODONE-acetaminophen (PERCOCET) 5-325 MG tablet, Take 1 tablet by mouth every 4 (four) hours as needed for severe pain., Disp: 10 tablet, Rfl: 0   oxyCODONE-acetaminophen (PERCOCET) 5-325 MG tablet, Take 1 tablet by mouth every 4 (four) hours as needed for severe pain., Disp: 30 tablet, Rfl: 0   oxyCODONE-acetaminophen (PERCOCET) 5-325 MG tablet, Take 1 tablet by mouth every 4 (four) hours as needed for severe pain., Disp: 30 tablet, Rfl: 0   oxyCODONE-acetaminophen (PERCOCET) 5-325 MG tablet, Take 1 tablet by mouth every 4 (four) hours as needed for severe pain., Disp: 30 tablet, Rfl: 0   oxyCODONE-acetaminophen (PERCOCET) 5-325 MG tablet, Take 1 tablet by mouth every 4 (four) hours as needed for severe pain., Disp: 30 tablet, Rfl: 0   oxyCODONE-acetaminophen (PERCOCET) 5-325 MG tablet, Take 1 tablet by  mouth every 4 (four) hours as needed for severe pain., Disp: 30 tablet, Rfl: 0   oxyCODONE-acetaminophen (PERCOCET) 5-325 MG tablet, Take 1 tablet by mouth every 4 (four) hours as needed for severe pain., Disp: 30 tablet, Rfl: 0   oxyCODONE-acetaminophen (PERCOCET) 5-325 MG tablet, Take 1 tablet by mouth every 4 (four) hours as needed for severe pain., Disp: 30 tablet, Rfl: 0   oxyCODONE-acetaminophen (PERCOCET) 5-325 MG tablet, Take 1 tablet by mouth every 4 (four) hours as needed for severe pain., Disp: 30 tablet, Rfl: 0   oxyCODONE-acetaminophen (PERCOCET/ROXICET) 5-325 MG tablet, TAKE ONE TABLET BY MOUTH EVERY FOUR HOURS AS NEEDED FOR SEVERE PAIN., Disp: 30 tablet, Rfl: 0   SPIRIVA RESPIMAT 1.25 MCG/ACT AERS, 2 puffs daily, Disp: , Rfl:    tamsulosin (FLOMAX) 0.4 MG CAPS capsule, Take 1 capsule (0.4 mg total) by mouth  daily., Disp: 30 capsule, Rfl: 0   UBRELVY 100 MG TABS, Take 1 tablet by mouth as needed., Disp: , Rfl:   Social History   Tobacco Use  Smoking Status Former   Current packs/day: 0.00   Average packs/day: 2.0 packs/day for 30.0 years (60.0 ttl pk-yrs)   Types: Cigarettes   Start date: 07/15/1992   Quit date: 07/15/2022   Years since quitting: 0.6  Smokeless Tobacco Never    Allergies  Allergen Reactions   Sulfa Antibiotics Hives   Objective:  There were no vitals filed for this visit. There is no height or weight on file to calculate BMI. Constitutional Well developed. Well nourished.  Vascular Dorsalis pedis pulses palpable bilaterally. Posterior tibial pulses palpable bilaterally. Capillary refill normal to all digits.  No cyanosis or clubbing noted. Pedal hair growth normal.  Neurologic Normal speech. Oriented to person, place, and time. Epicritic sensation to light touch grossly present bilaterally.  Dermatologic Nails well groomed and normal in appearance. No open wounds. No skin lesions.  Orthopedic: Pain on palpation of left second digit hammertoe  contracture noted semiflexible deformity noted.  Capsulitis noted at the PIPJ joint of the right second toe pain on palpation.  Possible joint contracture noted at the second metatarsophalangeal joint   Radiographs: None Assessment:   1. 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 Encounter for preoperative examination for general surgical procedure      Plan:  Patient was evaluated and treated and all questions answered.  Left second PIPJ joint capsulitis with underlying hammertoe contracture -All questions and concerns were discussed with the patient in extensive detail -Clinically she has failed all Conservative care at this time patient will benefit from hammertoe correction of right second digit with possible capsulotomy of the second metatarsal phalangeal joint I discussed my preoperative intraoperative postop plan in extensive detail she states understanding and would like to proceed with surgery with K wire fixation. -Informed surgical risk consent was reviewed and read aloud to the patient.  I reviewed the films.  I have discussed my findings with the patient in great detail.  I have discussed all risks including but not limited to infection, stiffness, scarring, limp, disability, deformity, damage to blood vessels and nerves, numbness, poor healing, need for braces, arthritis, chronic pain, amputation, death.  All benefits and realistic expectations discussed in great detail.  I have made no promises as to the outcome.  I have provided realistic expectations.  I have offered the patient a 2nd opinion, which they have declined and assured me they preferred to proceed despite the risks    No follow-ups on file.

## 2023-03-18 ENCOUNTER — Telehealth: Payer: Self-pay | Admitting: Podiatry

## 2023-03-18 NOTE — Telephone Encounter (Signed)
Patient called asking for pain medication refill. [place] pharmacy she uses.

## 2023-03-20 ENCOUNTER — Other Ambulatory Visit: Payer: Self-pay | Admitting: Podiatry

## 2023-03-20 DIAGNOSIS — M47816 Spondylosis without myelopathy or radiculopathy, lumbar region: Secondary | ICD-10-CM | POA: Insufficient documentation

## 2023-03-20 NOTE — Telephone Encounter (Signed)
Pt called asking if you would fill her pain medication

## 2023-03-21 NOTE — Telephone Encounter (Signed)
Pt called again to see if you would refill the pain medication for her.

## 2023-03-24 ENCOUNTER — Telehealth: Payer: Self-pay | Admitting: Podiatry

## 2023-03-24 NOTE — Progress Notes (Unsigned)
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  Knee-R CT w/wo contrast: No results found for this or any previous visit.  Knee-L CT w/wo contrast: No results found for this or any previous visit.  Knee-R CT wo contrast: No results found for this or any previous visit.  Knee-L CT wo contrast: No results found for this or any previous visit.  Knee-R DG 1-2 views: Results for orders placed during the hospital encounter of 09/28/19  DG Knee 1-2 Views Right  Narrative CLINICAL DATA:  Knee pain  EXAM: RIGHT KNEE - 1-2 VIEW  COMPARISON:  None.  FINDINGS: No evidence of fracture, or dislocation. Trace knee effusion. No evidence of arthropathy or other focal bone abnormality. Soft tissues are unremarkable.  IMPRESSION: Trace knee effusion.  Otherwise  negative   Electronically Signed By: Jasmine Pang M.D. On: 09/28/2019 20:17  Knee-L DG 1-2 views: Results for orders placed during the hospital encounter of 09/28/19  DG Knee 1-2 Views Left  Narrative CLINICAL DATA:  Knee pain  EXAM: LEFT KNEE - 1-2 VIEW  COMPARISON:  None.  FINDINGS: No evidence of fracture, dislocation, or joint effusion. No evidence of arthropathy or other focal bone abnormality. Soft tissues are unremarkable.  IMPRESSION: Negative.   Electronically Signed By: Jasmine Pang M.D. On: 09/28/2019 20:17  Knee-R DG 3 views: No results found for this or any previous visit.  Knee-L DG 3 views: No results found for this or any previous visit.  Knee-R DG 4 views: No results found for this or any previous visit.  Knee-L DG 4 views: No results found for this or any previous visit.  Knee-R DG Arthrogram: No results found for this or any previous visit.  Knee-L DG Arthrogram: No results found for this or any previous visit.   Ankle Imaging: Ankle-R DG Complete: Results for orders placed during the hospital encounter of 04/13/11  DG Ankle Complete Right  Narrative *RADIOLOGY REPORT*  Clinical Data: Fall, right ankle pain  RIGHT ANKLE - COMPLETE 3+ VIEW  Comparison: None.  Findings: Three views of the right ankle submitted.  No acute fracture or subluxation.  Ankle mortise preserved.  Soft tissue swelling noted adjacent to lateral malleolus.  IMPRESSION: No acute fracture or subluxation.  Soft tissue swelling adjacent to lateral malleolus.  Original Report Authenticated By: Natasha Mead, M.D.  Ankle-L DG Complete: No results found for this or any previous visit.   Foot Imaging: Foot-R DG Complete: Results for orders placed in visit on 12/10/22  DG Foot Complete Right  Narrative Please see detailed radiograph report in office note.  Foot-L DG Complete: Results for orders placed in visit on 02/07/22  DG Foot Complete  Left  Narrative Please see detailed radiograph report in office note.   Elbow Imaging: Elbow-R DG Complete: No results found for this or any previous visit.  Elbow-L DG Complete: No results found for this or any previous visit.   Wrist Imaging: Wrist-R DG Complete: No results found for this or any previous visit.  Wrist-L DG Complete: No results found for this or any previous visit.   Hand Imaging: Hand-R DG Complete: No results found for this or any previous visit.  Hand-L DG Complete: Results for orders placed during the hospital encounter of 05/06/18  DG Hand Complete Left  Narrative CLINICAL DATA:  Left hand pain.  No known injury.  EXAM: LEFT HAND - COMPLETE 3+ VIEW  COMPARISON:  None.  FINDINGS: There is no evidence of fracture or dislocation. There is no  evidence of arthropathy or other focal bone abnormality. Soft tissues are unremarkable.  IMPRESSION: Negative.   Electronically Signed By: Francene Boyers M.D. On: 05/06/2018 10:50   Complexity Note: Imaging results reviewed.                         ROS  Cardiovascular: {Hx; Cardiovascular History:210120525} Pulmonary or Respiratory: {Hx; Pumonary and/or Respiratory History:210120523} Neurological: {Hx; Neurological:210120504} Psychological-Psychiatric: {Hx; Psychological-Psychiatric History:210120512} Gastrointestinal: {Hx; Gastrointestinal:210120527} Genitourinary: {Hx; Genitourinary:210120506} Hematological: {Hx; Hematological:210120510} Endocrine: {Hx; Endocrine history:210120509} Rheumatologic: {Hx; Rheumatological:210120530} Musculoskeletal: {Hx; Musculoskeletal:210120528} Work History: {Hx; Work history:210120514}  Allergies  Norma [name] is allergic to sulfa antibiotics.  Laboratory Chemistry Profile   Renal Lab Results  Component Value Date   BUN 16 09/26/2022   CREATININE 0.69 09/26/2022   BCR 27 (H) 08/07/2021   GFRAA >60 03/03/2020   GFRNONAA >60 09/26/2022   SPECGRAV 1.025  09/18/2022   PHUR 5.5 09/18/2022   PROTEINUR 2+ (A) 09/18/2022     Electrolytes Lab Results  Component Value Date   NA 141 09/26/2022   K 3.5 09/26/2022   CL 109 09/26/2022   CALCIUM 8.6 (L) 09/26/2022   MG 2.1 09/28/2019     Hepatic Lab Results  Component Value Date   AST 12 08/07/2021   ALT 9 08/07/2021   ALBUMIN 3.6 (L) 08/07/2021   ALKPHOS 65 08/07/2021   LIPASE 40 06/21/2021     ID Lab Results  Component Value Date   HIV Non Reactive 03/01/2020   SARSCOV2NAA NEGATIVE 04/22/2021   MRSAPCR NEGATIVE 03/03/2020   PREGTESTUR NEGATIVE 09/15/2015     Bone Lab Results  Component Value Date   25OHVITD1 38 09/28/2019   25OHVITD2 <1.0 09/28/2019   25OHVITD3 38 09/28/2019     Endocrine Lab Results  Component Value Date   GLUCOSE 84 09/26/2022   GLUCOSEU Negative 09/18/2022   HGBA1C 5.4 03/01/2020   TSH 1.46 01/03/2022     Neuropathy Lab Results  Component Value Date   VITAMINB12 698 08/07/2021   HGBA1C 5.4 03/01/2020   HIV Non Reactive 03/01/2020     CNS No results found for: "COLORCSF", "APPEARCSF", "RBCCOUNTCSF", "WBCCSF", "POLYSCSF", "LYMPHSCSF", "EOSCSF", "PROTEINCSF", "GLUCCSF", "JCVIRUS", "CSFOLI", "IGGCSF", "LABACHR", "ACETBL"   Inflammation (CRP: Acute  ESR: Chronic) Lab Results  Component Value Date   CRP 1.0 (H) 09/28/2019   ESRSEDRATE 10 09/28/2019   LATICACIDVEN 1.18 06/04/2016     Rheumatology No results found for: "RF", "ANA", "LABURIC", "URICUR", "LYMEIGGIGMAB", "LYMEABIGMQN", "HLAB27"   Coagulation Lab Results  Component Value Date   PLT 291 09/26/2022     Cardiovascular Lab Results  Component Value Date   TROPONINI <0.03 04/01/2016   HGB 11.3 (L) 09/26/2022   HCT 34.9 (L) 09/26/2022     Screening Lab Results  Component Value Date   SARSCOV2NAA NEGATIVE 04/22/2021   MRSAPCR NEGATIVE 03/03/2020   HIV Non Reactive 03/01/2020   PREGTESTUR NEGATIVE 09/15/2015     Cancer Lab Results  Component Value Date   CA125 120.7  (H) 09/20/2015     Allergens No results found for: "ALMOND", "APPLE", "ASPARAGUS", "AVOCADO", "BANANA", "BARLEY", "BASIL", "BAYLEAF", "GREENBEAN", "LIMABEAN", "WHITEBEAN", "BEEFIGE", "REDBEET", "BLUEBERRY", "BROCCOLI", "CABBAGE", "MELON", "CARROT", "CASEIN", "CASHEWNUT", "CAULIFLOWER", "CELERY"     Note: Lab results reviewed.  PFSH  Drug: Norma [name]  reports no history of drug use. Alcohol:  reports no history of alcohol use. Tobacco:  reports that she quit smoking about 8 months ago. Her smoking use included cigarettes. She started smoking about  30 years ago. She has a 60 pack-year smoking history. She has never used smokeless tobacco. Medical:  has a past medical history of AKI (acute kidney injury) (HCC) (06/04/2016), Anxiety, Bacteremia (06/04/2016), Choledocholithiasis (03/01/2020), Cholelithiasis (03/02/2020), COPD (chronic obstructive pulmonary disease) (HCC), Depression, Diarrhea (06/04/2016), History of kidney stones, Hypertension, Hypokalemia (06/04/2016), IBS (irritable bowel syndrome), Nausea and vomiting (06/04/2016), Positive blood culture (06/04/2016), Rectal fissure (10/04/2015), and Surgery, elective (09/22/2019). Family: family history includes Birth defects in her daughter; COPD in her mother; Diabetes in her father; Heart disease in her maternal aunt and sister; Hypertension in her father; Kidney disease in her daughter; Seizures in her maternal aunt; Stroke in her father.  Past Surgical History:  Procedure Laterality Date   ABDOMINAL HYSTERECTOMY N/A 11/09/2015   Procedure: HYSTERECTOMY ABDOMINAL;  Surgeon: Tilda Burrow, MD;  Location: [facility];  Service: Gynecology;  Laterality: N/A;   CHOLECYSTECTOMY N/A 03/03/2020   Procedure: LAPAROSCOPIC CHOLECYSTECTOMY;  Surgeon: Franky Macho, MD;  Location: [facility];  Service: General;  Laterality: N/A;   COLONOSCOPY WITH PROPOFOL N/A 03/29/2021   Procedure: COLONOSCOPY WITH PROPOFOL;  Surgeon: Pasty Spillers, MD;  Location: [facility]  ENDOSCOPY;  Service: Endoscopy;  Laterality: N/A;   COLONOSCOPY WITH PROPOFOL N/A 09/05/2021   Procedure: COLONOSCOPY WITH PROPOFOL;  Surgeon: Toney Reil, MD;  Location: [facility] ENDOSCOPY;  Service: Gastroenterology;  Laterality: N/A;   CYSTOSCOPY WITH BIOPSY N/A 09/30/2022   Procedure: CYSTOSCOPY WITH BLADDER BIOPSY;  Surgeon: Vanna Scotland, MD;  Location: [facility];  Service: Urology;  Laterality: N/A;   CYSTOSCOPY/URETEROSCOPY/HOLMIUM LASER/STENT PLACEMENT Right 09/30/2022   Procedure: CYSTOSCOPY/URETEROSCOPY/HOLMIUM LASER/STENT PLACEMENT;  Surgeon: Vanna Scotland, MD;  Location: [facility];  Service: Urology;  Laterality: Right;   DILATION AND CURETTAGE OF UTERUS     ESOPHAGOGASTRODUODENOSCOPY (EGD) WITH PROPOFOL N/A 09/05/2021   Procedure: ESOPHAGOGASTRODUODENOSCOPY (EGD) WITH PROPOFOL;  Surgeon: Toney Reil, MD;  Location: [facility] ENDOSCOPY;  Service: Gastroenterology;  Laterality: N/A;   SALPINGOOPHORECTOMY Bilateral 11/09/2015   Procedure: SALPINGO OOPHORECTOMY;  Surgeon: Tilda Burrow, MD;  Location: [facility];  Service: Gynecology;  Laterality: Bilateral;   Active Ambulatory Problems    Diagnosis Date Noted   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 11/09/2015   Endometriosis of pelvis 11/16/2015   UTI (urinary tract infection) 06/04/2016   Abdominal pain, epigastric 06/04/2016   AKI (acute kidney injury) (HCC) 06/04/2016   Chronic low back pain (1ry area of Pain) (Bilateral) (L>R) w/o sciatica 09/22/2019   Cervicalgia 09/22/2019   Chronic neck pain (2ry area of Pain) (Bilateral) (L>R) 09/22/2019   Chronic lower extremity pain (3ry area of Pain) (Bilateral) (L>R) 09/22/2019   Chronic knee pain (Bilateral) (L>R) 09/22/2019   Chronic shoulder pain (Right) 09/22/2019   Chronic pain syndrome 09/22/2019   Disorder of skeletal system 09/22/2019   Problems influencing health status 09/22/2019   HTN (hypertension) 03/01/2020   Class 1 obesity  due to excess calories without serious comorbidity with body mass index (BMI) of 34.0 to 34.9 in adult    Tobacco abuse    Chronic obstructive pulmonary disease (HCC)    Duodenal ulcer    Gastric erosion    Personal history of colonic polyps    Family history of colon cancer requiring screening colonoscopy    Resolved Ambulatory Problems    Diagnosis Date Noted   Pelvic mass 09/20/2015   Rectal fissure 10/04/2015   Endometrioma of ovary bilateral 11/10/2015   Bacteremia 06/04/2016   Nausea and vomiting 06/04/2016   Hypokalemia 06/04/2016  Positive blood culture 06/04/2016   Diarrhea 06/04/2016   Surgery, elective 09/22/2019   Diabetes (HCC) 03/01/2020   Choledocholithiasis 03/01/2020   Cholelithiasis 03/02/2020   Past Medical History:  Diagnosis Date   Anxiety    COPD (chronic obstructive pulmonary disease) (HCC)    Depression    History of kidney stones    Hypertension    IBS (irritable bowel syndrome)    Constitutional Exam  General appearance: Well nourished, well developed, and well hydrated. In no apparent acute distress There were no vitals filed for this visit. BMI Assessment: Estimated body mass index is 35.94 kg/m as calculated from the following:   Height as of 02/17/23: 5\' 1"  (1.549 m).   Weight as of 02/17/23: 190 lb 3.2 oz (86.3 kg).  BMI interpretation table: BMI level Category Range association with higher incidence of chronic pain  <18 kg/m2 Underweight   18.5-24.9 kg/m2 Ideal body weight   25-29.9 kg/m2 Overweight Increased incidence by 20%  30-34.9 kg/m2 Obese (Class I) Increased incidence by 68%  35-39.9 kg/m2 Severe obesity (Class II) Increased incidence by 136%  >40 kg/m2 Extreme obesity (Class III) Increased incidence by 254%   Patient's current BMI Ideal Body weight  There is no height or weight on file to calculate BMI. Patient weight not recorded   BMI Readings from Last 4 Encounters:  02/17/23 35.94 kg/m  11/04/22 33.52 kg/m  10/29/22  34.01 kg/m  09/30/22 34.95 kg/m   Wt Readings from Last 4 Encounters:  02/17/23 190 lb 3.2 oz (86.3 kg)  11/04/22 177 lb 6.4 oz (80.5 kg)  10/29/22 180 lb (81.6 kg)  09/30/22 184 lb 15.5 oz (83.9 kg)    Psych/Mental status: Alert, oriented x 3 (person, place, & time)       Eyes: PERLA Respiratory: No evidence of acute respiratory distress  Assessment  Primary Diagnosis & Pertinent Problem List: {There were no encounter diagnoses. (Refresh or delete this SmartLink)}  Visit Diagnosis (New problems to examiner): No diagnosis found. Plan of Care (Initial workup plan)  Note: Norma [name] was reminded that as per protocol, today's visit has been an evaluation only. We have not taken over the patient's controlled substance management.  Problem-specific plan: No problem-specific Assessment & Plan notes found for this encounter.  Lab Orders  No laboratory test(s) ordered today   Imaging Orders  No imaging studies ordered today   Referral Orders  No referral(s) requested today   Procedure Orders    No procedure(s) ordered today   Pharmacotherapy (current): Medications ordered:  No orders of the defined types were placed in this encounter.  Medications administered during this visit: Norma [name] had no medications administered during this visit.   Analgesic Pharmacotherapy:  Opioid Analgesics: For patients currently taking or requesting to take opioid analgesics, in accordance with [facility] Guidelines, we will assess their risks and indications for the use of these substances. After completing our evaluation, we may offer recommendations, but we no longer take patients for medication management. The prescribing physician will ultimately decide, based on his/her training and level of comfort whether to adopt any of the recommendations, including whether or not to prescribe such medicines.  Membrane stabilizer: To be determined at a later time  Muscle  relaxant: To be determined at a later time  NSAID: To be determined at a later time  Other analgesic(s): To be determined at a later time   Interventional management options: Norma [name] was informed that there is no guarantee  that she would be a candidate for interventional therapies. The decision will be based on the results of diagnostic studies, as well as Norma [name]'s risk profile.  Procedure(s) under consideration:  Pending results of ordered studies      Interventional Therapies  Risk Factors  Considerations  Medical Comorbidities:     Planned  Pending:      Under consideration:   Pending   Completed:   None at this time   Therapeutic  Palliative (PRN) options:   None established   Completed by other providers:   None reported       Provider-requested follow-up: No follow-ups on file.  Future Appointments  Date Time Provider Department Center  03/26/2023  8:00 AM Delano Metz, MD [facility] None  10/29/2023  1:45 PM Vanna Scotland, MD [facility] None    Duration of encounter: *** minutes.  Total time on encounter, as per AMA guidelines included both the face-to-face and non-face-to-face time personally spent by the physician and/or other qualified health care professional(s) on the day of the encounter (includes time in activities that require the physician or other qualified health care professional and does not include time in activities normally performed by clinical staff). Physician's time may include the following activities when performed: Preparing to see the patient (e.g., pre-charting review of records, searching for previously ordered imaging, lab work, and nerve conduction tests) Review of prior analgesic pharmacotherapies. Reviewing PMP Interpreting ordered tests (e.g., lab work, imaging, nerve conduction tests) Performing post-procedure evaluations, including interpretation of diagnostic procedures Obtaining and/or reviewing separately  obtained history Performing a medically appropriate examination and/or evaluation Counseling and educating the patient/family/caregiver Ordering medications, tests, or procedures Referring and communicating with other health care professionals (when not separately reported) Documenting clinical information in the electronic or other health record Independently interpreting results (not separately reported) and communicating results to the patient/ family/caregiver Care coordination (not separately reported)  Note by: Oswaldo Done, MD (TTS technology used. I apologize for any typographical errors that were not detected and corrected.) Date: 03/26/2023; Time: 6:05 AM

## 2023-03-24 NOTE — Telephone Encounter (Signed)
Patient called asking for pain medication refill.

## 2023-03-25 ENCOUNTER — Other Ambulatory Visit: Payer: Self-pay | Admitting: Podiatry

## 2023-03-25 ENCOUNTER — Telehealth: Payer: Self-pay | Admitting: Podiatry

## 2023-03-25 DIAGNOSIS — F1190 Opioid use, unspecified, uncomplicated: Secondary | ICD-10-CM | POA: Insufficient documentation

## 2023-03-25 DIAGNOSIS — Z79891 Long term (current) use of opiate analgesic: Secondary | ICD-10-CM | POA: Insufficient documentation

## 2023-03-25 DIAGNOSIS — Z79899 Other long term (current) drug therapy: Secondary | ICD-10-CM | POA: Insufficient documentation

## 2023-03-25 DIAGNOSIS — T50905S Adverse effect of unspecified drugs, medicaments and biological substances, sequela: Secondary | ICD-10-CM | POA: Insufficient documentation

## 2023-03-25 DIAGNOSIS — Z0283 Encounter for blood-alcohol and blood-drug test: Secondary | ICD-10-CM | POA: Insufficient documentation

## 2023-03-25 DIAGNOSIS — G8929 Other chronic pain: Secondary | ICD-10-CM | POA: Insufficient documentation

## 2023-03-25 MED ORDER — OXYCODONE-ACETAMINOPHEN 10-325 MG PO TABS: 1.0000 | ORAL_TABLET | ORAL | 0 refills | Status: DC | PRN

## 2023-03-25 NOTE — Patient Instructions (Signed)

## 2023-03-25 NOTE — Telephone Encounter (Signed)
Patient needs oxycotin, 10 mg sent in.  Is waiting on someone to call back regarding surgery.

## 2023-03-26 ENCOUNTER — Ambulatory Visit (HOSPITAL_BASED_OUTPATIENT_CLINIC_OR_DEPARTMENT_OTHER): Payer: MEDICAID | Admitting: Pain Medicine

## 2023-03-26 DIAGNOSIS — T50905S Adverse effect of unspecified drugs, medicaments and biological substances, sequela: Secondary | ICD-10-CM

## 2023-03-26 DIAGNOSIS — Z79899 Other long term (current) drug therapy: Secondary | ICD-10-CM

## 2023-03-26 DIAGNOSIS — G8929 Other chronic pain: Secondary | ICD-10-CM

## 2023-03-26 DIAGNOSIS — G894 Chronic pain syndrome: Secondary | ICD-10-CM

## 2023-03-26 DIAGNOSIS — Z91199 Patient's noncompliance with other medical treatment and regimen due to unspecified reason: Secondary | ICD-10-CM

## 2023-03-26 DIAGNOSIS — Z789 Other specified health status: Secondary | ICD-10-CM

## 2023-03-26 DIAGNOSIS — F1190 Opioid use, unspecified, uncomplicated: Secondary | ICD-10-CM

## 2023-03-26 DIAGNOSIS — M899 Disorder of bone, unspecified: Secondary | ICD-10-CM

## 2023-03-26 DIAGNOSIS — Z79891 Long term (current) use of opiate analgesic: Secondary | ICD-10-CM

## 2023-03-31 ENCOUNTER — Other Ambulatory Visit: Payer: Self-pay | Admitting: Podiatry

## 2023-04-01 DIAGNOSIS — R4589 Other symptoms and signs involving emotional state: Secondary | ICD-10-CM | POA: Insufficient documentation

## 2023-04-01 NOTE — Telephone Encounter (Signed)
Patient needs a prescription for pain.

## 2023-04-02 ENCOUNTER — Telehealth: Payer: Self-pay | Admitting: Podiatry

## 2023-04-02 NOTE — Telephone Encounter (Signed)
Pt called again about her refill on pain medication.

## 2023-04-02 NOTE — Telephone Encounter (Signed)
Pt called requesting a refill for the pain medication prescribed.

## 2023-04-04 NOTE — Telephone Encounter (Signed)
Patient called again. Stated the pharmacy hasn't received her prescription.

## 2023-04-09 ENCOUNTER — Emergency Department: Payer: MEDICAID

## 2023-04-09 ENCOUNTER — Other Ambulatory Visit: Payer: Self-pay

## 2023-04-09 ENCOUNTER — Encounter: Payer: Self-pay | Admitting: *Deleted

## 2023-04-09 ENCOUNTER — Emergency Department
Admission: EM | Admit: 2023-04-09 | Discharge: 2023-04-09 | Disposition: A | Discharge status: Home / Self Care | Payer: MEDICAID | Attending: Emergency Medicine | Admitting: Emergency Medicine

## 2023-04-09 DIAGNOSIS — R519 Headache, unspecified: Secondary | ICD-10-CM | POA: Diagnosis not present

## 2023-04-09 DIAGNOSIS — I10 Essential (primary) hypertension: Secondary | ICD-10-CM | POA: Diagnosis not present

## 2023-04-09 DIAGNOSIS — R079 Chest pain, unspecified: Secondary | ICD-10-CM | POA: Diagnosis present

## 2023-04-09 LAB — CBC
HCT: 34.1 % — ABNORMAL LOW (ref 36.0–46.0)
Hemoglobin: 11.4 g/dL — ABNORMAL LOW (ref 12.0–15.0)
MCH: 29.2 pg (ref 26.0–34.0)
MCHC: 33.4 g/dL (ref 30.0–36.0)
MCV: 87.2 fL (ref 80.0–100.0)
Platelets: 242 10*3/uL (ref 150–400)
RBC: 3.91 MIL/uL (ref 3.87–5.11)
RDW: 13.3 % (ref 11.5–15.5)
WBC: 5.2 10*3/uL (ref 4.0–10.5)
nRBC: 0 % (ref 0.0–0.2)

## 2023-04-09 LAB — BASIC METABOLIC PANEL
Anion gap: 7 (ref 5–15)
BUN: 14 mg/dL (ref 6–20)
CO2: 23 mmol/L (ref 22–32)
Calcium: 8.5 mg/dL — ABNORMAL LOW (ref 8.9–10.3)
Chloride: 109 mmol/L (ref 98–111)
Creatinine, Ser: 0.72 mg/dL (ref 0.44–1.00)
GFR, Estimated: >60 mL/min (ref >60)
Glucose, Bld: 94 mg/dL (ref 70–99)
Potassium: 3.2 mmol/L — ABNORMAL LOW (ref 3.5–5.1)
Sodium: 139 mmol/L (ref 135–145)

## 2023-04-09 LAB — TROPONIN I (HIGH SENSITIVITY): Troponin I (High Sensitivity): 4 ng/L (ref <18)

## 2023-04-09 MED ORDER — ACETAMINOPHEN 325 MG PO TABS
650.0000 mg | ORAL_TABLET | Freq: Once | ORAL | Status: AC
  Administered 2023-04-09: 650 mg via ORAL
  Filled 2023-04-09: qty 2

## 2023-04-09 MED ORDER — TRAMADOL HCL 50 MG PO TABS
50.0000 mg | ORAL_TABLET | Freq: Once | ORAL | Status: AC
  Administered 2023-04-09: 50 mg via ORAL
  Filled 2023-04-09: qty 1

## 2023-04-09 MED ORDER — KETOROLAC TROMETHAMINE 15 MG/ML IJ SOLN
15.0000 mg | Freq: Once | INTRAMUSCULAR | Status: AC
  Administered 2023-04-09: 15 mg via INTRAMUSCULAR
  Filled 2023-04-09: qty 1

## 2023-04-09 NOTE — ED Notes (Signed)
See triage notes. Patient c/o chest pain that began an hour ago along with nausea. Patient also c/o severe headache with sensitivity to light since yesterday.

## 2023-04-09 NOTE — ED Provider Notes (Cosign Needed Addendum)
[facility] Provider Note    Event Date/Time   First MD Initiated Contact with Patient 04/09/23 1342     (approximate)   History   Chest Pain   HPI  Norma [name] is a 47 y.o. female with PMH of PAD, hypertension, chronic pain who presents for evaluation of chest pain and a headache.  Patient states that her headache is a migraine which she has had before.  She also states she has some blurry vision which is normal when she has a migraine.  Her pain began yesterday and she took some [place] but this has not helped.  She describes the headache as wrapping around her entire head.  Her chest pain started about an hour prior to arrival.  The pain is sharp and located in the middle of her chest.      Physical Exam   Triage Vital Signs: ED Triage Vitals  Encounter Vitals Group     BP 04/09/23 1256 (!) 153/93     Systolic BP Percentile --      Diastolic BP Percentile --      Pulse Rate 04/09/23 1256 (!) 59     Resp 04/09/23 1256 18     Temp 04/09/23 1256 98.4 F (36.9 C)     Temp Source 04/09/23 1256 Oral     SpO2 04/09/23 1256 100 %     Weight --      Height --      Head Circumference --      Peak Flow --      Pain Score 04/09/23 1257 9     Pain Loc --      Pain Education --      Exclude from Growth Chart --     Most recent vital signs: Vitals:   04/09/23 1256  BP: (!) 153/93  Pulse: (!) 59  Resp: 18  Temp: 98.4 F (36.9 C)  SpO2: 100%    General: Awake, no distress.  CV:  Good peripheral perfusion.  RRR. Resp:  Normal effort.  CTAB. Abd:  No distention.  Other:  No focal neurodeficits.  PERRL.  EOM intact.  No ataxia.   ED Results / Procedures / Treatments   Labs (all labs ordered are listed, but only abnormal results are displayed) Labs Reviewed  BASIC METABOLIC PANEL - Abnormal; Notable for the following components:      Result Value   Potassium 3.2 (*)    Calcium 8.5 (*)    All other components within normal limits   CBC - Abnormal; Notable for the following components:   Hemoglobin 11.4 (*)    HCT 34.1 (*)    All other components within normal limits  TROPONIN I (HIGH SENSITIVITY)  TROPONIN I (HIGH SENSITIVITY)     EKG  Normal sinus rhythm with no ST changes.  Normal axis.  Vent. rate 62 BPM PR interval 150 ms QRS duration 82 ms QT/QTcB 422/428 ms P-R-T axes 35 44 40  RADIOLOGY  Chest x-ray obtained, interpreted the images as well as reviewed the radiologist report which is negative for any acute cardiopulmonary abnormalities.   PROCEDURES:  Critical Care performed: No  Procedures   MEDICATIONS ORDERED IN ED: Medications  traMADol (ULTRAM) tablet 50 mg (has no administration in time range)  ketorolac (TORADOL) 15 MG/ML injection 15 mg (15 mg Intramuscular Given 04/09/23 1416)  acetaminophen (TYLENOL) tablet 650 mg (650 mg Oral Given 04/09/23 1416)     IMPRESSION / MDM / ASSESSMENT  AND PLAN / ED COURSE  I reviewed the triage vital signs and the nursing notes.                              Differential diagnosis includes, but is not limited to, ACS, aortic dissection, pulmonary embolism, cardiac tamponade, pneumothorax, pneumonia, pericarditis, myocarditis, GI-related causes including esophagitis/gastritis, and musculoskeletal chest wall pain.    Patient's presentation is most consistent with acute presentation with potential threat to life or bodily function.  CBC and BMP unremarkable. Troponin was not elevated.  Discussed with the patient that to be absolutely sure, she should have a second troponin drawn, however she did not want to wait for these results.  My suspicion for heart attack is very low so I was comfortable with this.  Chest x-ray obtained, interpreted the images as well as reviewed the radiologist report which was negative for any acute cardiopulmonary abnormalities.  EKG shows normal sinus rhythm with no ST changes and normal axis.  Patient's cardiac workup is  reassuring, I do not suspect a life-threatening cause of chest pain. Based on the [facility] criteria I can rule the patient out for pulmonary embolism.  Patient was given Toradol and Tylenol to address her migraine symptoms. She reported that she still had pain so she was given tramadol. I explained to the patient that she cannot drive after taking this medication so she was agreeable to have someone pick her up.  I discussed with the patient taking NSAIDs, Tylenol and the Ubrelvy at home to manage her migraine symptoms.  I advised her to follow-up with her primary care provider as needed.  All questions were answered, patient was agreeable to plan and she was stable at discharge.    FINAL CLINICAL IMPRESSION(S) / ED DIAGNOSES   Final diagnoses:  Chest pain, unspecified type     Rx / DC Orders   ED Discharge Orders     None        Note:  This document was prepared using Dragon voice recognition software and may include unintentional dictation errors.   Cameron Ali, PA-C 04/09/23 1440    Cameron Ali, PA-C 04/09/23 1441    Jene Every, MD 04/10/23 1145

## 2023-04-09 NOTE — ED Triage Notes (Signed)
Central chest pain which began an hour ago and has been associated with nausea.  Pt also reports severe headache with sensitivity to light since yesterday.

## 2023-04-09 NOTE — Discharge Instructions (Signed)
You can take 650 mg of Tylenol and 600 mg of ibuprofen every 6 hours as needed for pain.  Please follow up with your PCP as needed.

## 2023-04-10 DIAGNOSIS — M5416 Radiculopathy, lumbar region: Secondary | ICD-10-CM | POA: Insufficient documentation

## 2023-04-10 DIAGNOSIS — M48061 Spinal stenosis, lumbar region without neurogenic claudication: Secondary | ICD-10-CM | POA: Insufficient documentation

## 2023-04-16 NOTE — Telephone Encounter (Signed)
Pt called again checking to see if her medication was sent in.

## 2023-04-17 ENCOUNTER — Other Ambulatory Visit: Payer: Self-pay | Admitting: Podiatry

## 2023-04-17 MED ORDER — OXYCODONE-ACETAMINOPHEN 10-325 MG PO TABS: 1.0000 | ORAL_TABLET | ORAL | 0 refills | Status: DC | PRN

## 2023-04-17 NOTE — Telephone Encounter (Signed)
Notified pt medication was sent in. She said thank you

## 2023-04-29 ENCOUNTER — Emergency Department
Admission: EM | Admit: 2023-04-29 | Discharge: 2023-04-29 | Disposition: A | Discharge status: Home / Self Care | Payer: MEDICAID | Attending: Emergency Medicine | Admitting: Emergency Medicine

## 2023-04-29 ENCOUNTER — Telehealth: Payer: Self-pay | Admitting: Podiatry

## 2023-04-29 ENCOUNTER — Other Ambulatory Visit: Payer: Self-pay | Admitting: Podiatry

## 2023-04-29 ENCOUNTER — Other Ambulatory Visit: Payer: Self-pay

## 2023-04-29 DIAGNOSIS — I10 Essential (primary) hypertension: Secondary | ICD-10-CM | POA: Diagnosis not present

## 2023-04-29 DIAGNOSIS — G8929 Other chronic pain: Secondary | ICD-10-CM | POA: Diagnosis not present

## 2023-04-29 DIAGNOSIS — M545 Low back pain, unspecified: Secondary | ICD-10-CM | POA: Insufficient documentation

## 2023-04-29 DIAGNOSIS — J449 Chronic obstructive pulmonary disease, unspecified: Secondary | ICD-10-CM | POA: Diagnosis not present

## 2023-04-29 MED ORDER — KETOROLAC TROMETHAMINE 15 MG/ML IJ SOLN
15.0000 mg | Freq: Once | INTRAMUSCULAR | Status: AC
  Administered 2023-04-29: 15 mg via INTRAMUSCULAR
  Filled 2023-04-29: qty 1

## 2023-04-29 MED ORDER — MORPHINE SULFATE (PF) 4 MG/ML IV SOLN
4.0000 mg | Freq: Once | INTRAVENOUS | Status: AC
  Administered 2023-04-29: 4 mg via INTRAMUSCULAR
  Filled 2023-04-29: qty 1

## 2023-04-29 MED ORDER — LIDOCAINE 5 % EX PTCH
1.0000 | MEDICATED_PATCH | CUTANEOUS | Status: DC
  Administered 2023-04-29: 1 via TRANSDERMAL
  Filled 2023-04-29: qty 1

## 2023-04-29 NOTE — Discharge Instructions (Signed)
Please follow-up with your orthopedist and pain management provider. Please return to the emergency department for any new, worsening, or changing symptoms or other concerns including weakness in your legs, urinary or stool incontinence or retention, numbness or tingling in your extremities/buttocks/groin, fevers, or any other concerns or change in symptoms. It was a pleasure caring for you today.

## 2023-04-29 NOTE — Telephone Encounter (Signed)
DOS- 05/19/2023  [facility] REPAIR 2ND [id number]  [facility] EFFECTIVE DATE- 05/27/2023  PER INCOMING FAX FROM [facility]/ [place] [facility], PRIOR AUTH IS NOT REQUIRED FOR CPT CODE 44010. GOOD FROM 05/19/2023 - 06/18/2023

## 2023-04-29 NOTE — Telephone Encounter (Signed)
Pt called for pain med refill.

## 2023-04-29 NOTE — Telephone Encounter (Signed)
Pt called back again checking to see if her pain medication was sent in.  I checked and did not see it. I did explain to pt that Dr Allena Katz did have a surgery at lunch time.
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
## 2023-04-29 NOTE — Telephone Encounter (Signed)
Notified pt she would have to wait another 2 weeks to get her pain medication refilled.

## 2023-05-14 ENCOUNTER — Telehealth: Payer: Self-pay | Admitting: Podiatry

## 2023-05-14 ENCOUNTER — Other Ambulatory Visit: Payer: Self-pay | Admitting: Podiatry

## 2023-05-14 MED ORDER — OXYCODONE-ACETAMINOPHEN 10-325 MG PO TABS
1.0000 | ORAL_TABLET | ORAL | 0 refills | Status: DC | PRN
Start: 2023-05-14 — End: 2023-05-26

## 2023-05-14 NOTE — Telephone Encounter (Signed)
Called LM on VM to pt that RX was sent over.

## 2023-05-14 NOTE — Telephone Encounter (Signed)
Patient called to see if her pain medication would be called in today

## 2023-05-19 ENCOUNTER — Other Ambulatory Visit: Payer: Self-pay | Admitting: Podiatry

## 2023-05-19 DIAGNOSIS — M7752 Other enthesopathy of left foot: Secondary | ICD-10-CM | POA: Diagnosis not present

## 2023-05-19 DIAGNOSIS — M2042 Other hammer toe(s) (acquired), left foot: Secondary | ICD-10-CM | POA: Diagnosis not present

## 2023-05-19 MED ORDER — IBUPROFEN 800 MG PO TABS: 800.0000 mg | ORAL_TABLET | Freq: Four times a day (QID) | ORAL | 1 refills | Status: DC | PRN

## 2023-05-19 MED ORDER — OXYCODONE-ACETAMINOPHEN 5-325 MG PO TABS: 1.0000 | ORAL_TABLET | ORAL | 0 refills | Status: DC | PRN

## 2023-05-20 ENCOUNTER — Other Ambulatory Visit: Payer: Self-pay | Admitting: Podiatry

## 2023-05-20 MED ORDER — OXYCODONE-ACETAMINOPHEN 10-325 MG PO TABS: 1.0000 | ORAL_TABLET | ORAL | 0 refills | Status: DC | PRN

## 2023-05-26 ENCOUNTER — Telehealth: Payer: Self-pay | Admitting: Podiatry

## 2023-05-26 ENCOUNTER — Ambulatory Visit (INDEPENDENT_AMBULATORY_CARE_PROVIDER_SITE_OTHER): Payer: MEDICAID | Admitting: Physician Assistant

## 2023-05-26 ENCOUNTER — Encounter: Payer: Self-pay | Admitting: Physician Assistant

## 2023-05-26 VITALS — BP 124/80 | Ht 61.0 in | Wt 199.0 lb

## 2023-05-26 DIAGNOSIS — F411 Generalized anxiety disorder: Secondary | ICD-10-CM | POA: Insufficient documentation

## 2023-05-26 DIAGNOSIS — F331 Major depressive disorder, recurrent, moderate: Secondary | ICD-10-CM | POA: Insufficient documentation

## 2023-05-26 DIAGNOSIS — E559 Vitamin D deficiency, unspecified: Secondary | ICD-10-CM | POA: Insufficient documentation

## 2023-05-26 DIAGNOSIS — F410 Panic disorder [episodic paroxysmal anxiety] without agoraphobia: Secondary | ICD-10-CM | POA: Insufficient documentation

## 2023-05-26 DIAGNOSIS — Z23 Encounter for immunization: Secondary | ICD-10-CM

## 2023-05-26 DIAGNOSIS — G43909 Migraine, unspecified, not intractable, without status migrainosus: Secondary | ICD-10-CM | POA: Diagnosis not present

## 2023-05-26 DIAGNOSIS — D509 Iron deficiency anemia, unspecified: Secondary | ICD-10-CM | POA: Insufficient documentation

## 2023-05-26 DIAGNOSIS — Z79899 Other long term (current) drug therapy: Secondary | ICD-10-CM

## 2023-05-26 MED ORDER — CLONAZEPAM 1 MG PO TABS
1.0000 mg | ORAL_TABLET | Freq: Three times a day (TID) | ORAL | 2 refills | Status: DC
Start: 2023-05-26 — End: 2023-08-11

## 2023-05-26 MED ORDER — SUMATRIPTAN SUCCINATE 50 MG PO TABS: 50.0000 mg | ORAL_TABLET | Freq: Every day | ORAL | 2 refills | Status: DC | PRN

## 2023-05-26 MED ORDER — FLUOXETINE HCL 40 MG PO CAPS
40.0000 mg | ORAL_CAPSULE | Freq: Every day | ORAL | 1 refills | Status: DC
Start: 2023-05-26 — End: 2023-11-18

## 2023-05-26 NOTE — Telephone Encounter (Signed)
Patient called requesting a refill on medication for oxyCODONE-acetaminophen (PERCOCET) 10-325 MG tablet.

## 2023-05-26 NOTE — Progress Notes (Signed)
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  Previously seen by Dr. Girtha Rm.  Recently had ESI for her chronic back pain, says it did not help.  Continues to take Percocet daily, curiously prescribed by her podiatrist with fills multiple times per month every month since August 2023.  Endorses migraine headache for the last 2 days with associated nausea, sensitivity to light and sound, and aura.  Pain is located in the forehead.  [name] previously ineffective.  Patient states neurology was considering an injection for migraines.  Sister uses sumatriptan with good effect.  Patient asks for an injection in clinic for the pain, but informed we do not have such injectables here.  Anxiety with panic, on clonazepam chronically.  Initially tells me that she takes this "as needed", but when asked how often she needs it, she says she takes this 4 times per day every day.  Last prescription written just over 3 months ago for 120 tablets.  Patient asked for dose increase today, but informed she is at the maximum dose.  Also asked for switch to Xanax which she would plan to use every day.  Previously had psychiatry through [facility], but did not like them very much.  Does not currently have a behavioral health provider.  Chart review shows history of iron deficiency anemia and vitamin D deficiency.  Curiously she has also had a persistent hypocalcemia for the last 2 years.  She is not currently taking any vitamins or supplements to help with these problems, states nobody ever told her  to.  Medication list has been reviewed and updated.  Current Meds  Medication Sig   ADVAIR DISKUS 250-50 MCG/ACT AEPB Inhale 1 puff into the lungs 2 (two) times daily.   albuterol (VENTOLIN HFA) 108 (90 Base) MCG/ACT inhaler Inhale 2 puffs into the lungs every 6 (six) hours as needed for wheezing or shortness of breath.   amLODipine (NORVASC) 5 MG tablet Take 5 mg by mouth daily.   ARIPiprazole (ABILIFY) 2 MG tablet Take 2 mg by mouth daily.   fluticasone (FLONASE) 50 MCG/ACT nasal spray Place 1 spray into both nostrils daily.   ibuprofen (ADVIL) 800 MG tablet TAKE ONE TABLET (800 MG TOTAL) BY MOUTH EVERY SIX HOURS AS NEEDED.   lisinopril (ZESTRIL) 20 MG tablet Take 20 mg by mouth daily.   nystatin cream (MYCOSTATIN) Apply 1 Application topically as needed.   oxybutynin (DITROPAN) 5 MG tablet Take 1 tablet (5 mg total) by mouth every 8 (eight) hours as needed for bladder spasms.   SPIRIVA RESPIMAT 1.25 MCG/ACT AERS 2 puffs daily   SUMAtriptan (IMITREX) 50 MG tablet Take 1 tablet (50 mg total) by mouth daily as needed for migraine. May repeat in 2 hours if headache persists or recurs.   tamsulosin (FLOMAX) 0.4 MG CAPS capsule Take 1 capsule (0.4 mg total) by mouth daily.   UBRELVY 100 MG TABS Take 1 tablet by mouth as needed.   [DISCONTINUED] FLUoxetine (PROZAC) 20 MG capsule Take 20 mg by mouth daily.   [DISCONTINUED] ibuprofen (ADVIL) 800 MG  tablet Take 1 tablet (800 mg total) by mouth every 6 (six) hours as needed.   [DISCONTINUED] oxyCODONE-acetaminophen (PERCOCET) 5-325 MG tablet Take 1 tablet by mouth every 4 (four) hours as needed for severe pain (pain score 7-10).     Review of Systems  Constitutional:  Positive for fatigue. Negative for fever.  Respiratory:  Negative for chest tightness and shortness of breath.   Cardiovascular:  Negative for chest pain and palpitations.  Gastrointestinal:  Negative for abdominal pain.  Musculoskeletal:  Positive for arthralgias, back pain and  gait problem.  Neurological:  Positive for headaches.  Psychiatric/Behavioral:  Positive for decreased concentration and sleep disturbance. Negative for suicidal ideas. The patient is nervous/anxious.     Patient Active Problem List   Diagnosis Date Noted   Moderate episode of recurrent major depressive disorder (HCC) 05/26/2023   Generalized anxiety disorder with panic attacks 05/26/2023   Iron deficiency anemia 05/26/2023   Hypocalcemia 05/26/2023   Migraine without status migrainosus, not intractable 05/26/2023   Long-term current use of benzodiazepine 03/25/2023   Long term prescription opiate use 03/25/2023   Physical tolerance to opiate drug 03/25/2023   Lumbar spondylosis 03/20/2023   Duodenal ulcer    Gastric erosion    History of colonic polyps    Class 1 obesity due to excess calories without serious comorbidity with body mass index (BMI) of 34.0 to 34.9 in adult    Tobacco abuse    Chronic obstructive pulmonary disease (HCC)    HTN (hypertension) 03/01/2020   Chronic low back pain (1ry area of Pain) (Bilateral) (L>R) w/o sciatica 09/22/2019   Chronic neck pain (2ry area of Pain) (Bilateral) (L>R) 09/22/2019   Chronic lower extremity pain (3ry area of Pain) (Bilateral) (L>R) 09/22/2019   Chronic knee pain (Bilateral) (L>R) 09/22/2019   Chronic shoulder pain (Right) 09/22/2019   Chronic pain syndrome 09/22/2019   Endometriosis of pelvis 11/16/2015    Allergies  Allergen Reactions   Sulfa Antibiotics Hives    Immunization History  Administered Date(s) Administered   Influenza, Seasonal, Injecte, Preservative Fre 05/26/2023    Past Surgical History:  Procedure Laterality Date   ABDOMINAL HYSTERECTOMY N/A 11/09/2015   Procedure: HYSTERECTOMY ABDOMINAL;  Surgeon: Tilda Burrow, MD;  Location: [facility];  Service: Gynecology;  Laterality: N/A;   CHOLECYSTECTOMY N/A 03/03/2020   Procedure: LAPAROSCOPIC CHOLECYSTECTOMY;  Surgeon: Franky Macho, MD;  Location: [facility];   Service: General;  Laterality: N/A;   COLONOSCOPY WITH PROPOFOL N/A 03/29/2021   Procedure: COLONOSCOPY WITH PROPOFOL;  Surgeon: Pasty Spillers, MD;  Location: [facility];  Service: Endoscopy;  Laterality: N/A;   COLONOSCOPY WITH PROPOFOL N/A 09/05/2021   Procedure: COLONOSCOPY WITH PROPOFOL;  Surgeon: Toney Reil, MD;  Location[facility];  Service: Gastroenterology;  Laterality: N/A;   CYSTOSCOPY WITH BIOPSY N/A 09/30/2022   Procedure: CYSTOSCOPY WITH BLADDER BIOPSY;  Surgeon: Vanna Scotland, MD;  Location: [facility];  Service: Urology;  Laterality: N/A;   CYSTOSCOPY/URETEROSCOPY/HOLMIUM LASER/STENT PLACEMENT Right 09/30/2022   Procedure: CYSTOSCOPY/URETEROSCOPY/HOLMIUM LASER/STENT PLACEMENT;  Surgeon: Vanna Scotland, MD;  Location: [facility];  Service: Urology;  Laterality: Right;   DILATION AND CURETTAGE OF UTERUS     ESOPHAGOGASTRODUODENOSCOPY (EGD) WITH PROPOFOL N/A 09/05/2021   Procedure: ESOPHAGOGASTRODUODENOSCOPY (EGD) WITH PROPOFOL;  Surgeon: Toney Reil, MD;  Location: [facility] ENDOSCOPY;  Service: Gastroenterology;  Laterality: N/A;   SALPINGOOPHORECTOMY Bilateral 11/09/2015   Procedure: SALPINGO OOPHORECTOMY;  Surgeon: Tilda Burrow, MD;  Location: [facility];  Service: Gynecology;  Laterality: Bilateral;  Social History   Tobacco Use   Smoking status: Former    Current packs/day: 0.00    Average packs/day: 2.0 packs/day for 30.0 years (60.0 ttl pk-yrs)    Types: Cigarettes    Start date: 07/15/1992    Quit date: 07/15/2022    Years since quitting: 0.8   Smokeless tobacco: Never  Vaping Use   Vaping status: Never Used  Substance Use Topics   Alcohol use: No    Alcohol/week: 0.0 standard drinks of alcohol   Drug use: No    Family History  Problem Relation Age of Onset   COPD Mother    Stroke Father    Diabetes Father    Hypertension Father    Heart disease Sister    Seizures Maternal Aunt    Heart disease Maternal Aunt    Birth defects Daughter     Kidney disease Daughter    Bladder Cancer Neg Hx    Kidney cancer Neg Hx         05/26/2023    2:21 PM  GAD 7 : Generalized Anxiety Score  Nervous, Anxious, on Edge 2  Control/stop worrying 2  Worry too much - different things 2  Trouble relaxing 2  Restless 2  Easily annoyed or irritable 2  Afraid - awful might happen 2  Total GAD 7 Score 14  Anxiety Difficulty Not difficult at all       05/26/2023    2:21 PM 09/21/2019   11:05 AM  Depression screen PHQ 2/9  Decreased Interest 2 0  Down, Depressed, Hopeless 2 3  PHQ - 2 Score 4 3  Altered sleeping 0 3  Tired, decreased energy 2 3  Change in appetite 0 0  Feeling bad or failure about yourself  2 3  Trouble concentrating 2 3  Moving slowly or fidgety/restless 2 3  Suicidal thoughts 0 1  PHQ-9 Score 12 19  Difficult doing work/chores Very difficult Somewhat difficult    BP Readings from Last 3 Encounters:  05/26/23 124/80  04/29/23 (!) 167/92  04/09/23 (!) 153/93    Wt Readings from Last 3 Encounters:  05/26/23 199 lb (90.3 kg)  04/29/23 189 lb 9.5 oz (86 kg)  02/17/23 190 lb 3.2 oz (86.3 kg)    BP 124/80 (BP Location: Left Arm, Patient Position: Sitting, Cuff Size: Large)   Ht 5\' 1"  (1.549 m)   Wt 199 lb (90.3 kg)   LMP 09/24/2015   BMI 37.60 kg/m   Physical Exam Vitals and nursing note reviewed.  Constitutional:      Appearance: Normal appearance.  Neck:     Vascular: No carotid bruit.  Cardiovascular:     Rate and Rhythm: Normal rate and regular rhythm.     Heart sounds: No murmur heard.    No friction rub. No gallop.  Pulmonary:     Effort: Pulmonary effort is normal.     Breath sounds: Normal breath sounds.  Abdominal:     General: There is no distension.  Musculoskeletal:        General: Normal range of motion.     Comments: Walks with a cane. Left foot is in an ortho boot.   Skin:    General: Skin is warm and dry.  Neurological:     Mental Status: She is alert and oriented to  person, place, and time.     Gait: Gait is intact.  Psychiatric:        Mood and Affect: Mood and affect  normal.     Comments: Patient seems a little sedated with speech, slow blinking.      Recent Labs     Component Value Date/Time   NA 139 04/09/2023 1303   NA 141 08/07/2021 1558   K 3.2 (L) 04/09/2023 1303   CL 109 04/09/2023 1303   CO2 23 04/09/2023 1303   GLUCOSE 94 04/09/2023 1303   BUN 14 04/09/2023 1303   BUN 16 08/07/2021 1558   CREATININE 0.72 04/09/2023 1303   CALCIUM 8.5 (L) 04/09/2023 1303   PROT 6.0 08/07/2021 1558   ALBUMIN 3.6 (L) 08/07/2021 1558   AST 12 08/07/2021 1558   ALT 9 08/07/2021 1558   ALKPHOS 65 08/07/2021 1558   BILITOT <0.2 08/07/2021 1558   GFRNONAA >60 04/09/2023 1303   GFRAA >60 03/03/2020 0716    Lab Results  Component Value Date   WBC 5.2 04/09/2023   HGB 11.4 (L) 04/09/2023   HCT 34.1 (L) 04/09/2023   MCV 87.2 04/09/2023   PLT 242 04/09/2023   Lab Results  Component Value Date   HGBA1C 5.4 03/01/2020   No results found for: "CHOL", "HDL", "LDLCALC", "LDLDIRECT", "TRIG", "CHOLHDL" Lab Results  Component Value Date   TSH 1.46 01/03/2022     Assessment and Plan:  1. Generalized anxiety disorder with panic attacks Agreed to fill clonazepam, but dose decreased to 3 times daily (3 mg total daily dose).  Explained to patient that with her chronic use of opioids and benzos, risk of side effects and oversedation is increased.  Advised I am not willing to prescribe more than this.  I will increase her fluoxetine dose, which might also help with some of her chronic pain.  Referring to psychiatry for further management of her mood disorders and psychiatric medications. - clonazePAM (KLONOPIN) 1 MG tablet; Take 1 tablet (1 mg total) by mouth 3 (three) times daily.  Dispense: 90 tablet; Refill: 2 - FLUoxetine (PROZAC) 40 MG capsule; Take 1 capsule (40 mg total) by mouth daily.  Dispense: 90 capsule; Refill: 1 - Ambulatory referral to  Psychiatry  2. Long-term current use of benzodiazepine Plan as above  3. Moderate episode of recurrent major depressive disorder (HCC) Plan as above - FLUoxetine (PROZAC) 40 MG capsule; Take 1 capsule (40 mg total) by mouth daily.  Dispense: 90 capsule; Refill: 1 - Ambulatory referral to Psychiatry  4. Migraine without status migrainosus, not intractable, unspecified migraine type Stop Ubrelvy, try sumatriptan as below.  No history of heart attack or stroke. - SUMAtriptan (IMITREX) 50 MG tablet; Take 1 tablet (50 mg total) by mouth daily as needed for migraine. May repeat in 2 hours if headache persists or recurs.  Dispense: 10 tablet; Refill: 2  5. Hypocalcemia Repeating some labs today, namely CMP, PTH, vitamin D, TSH. - Comprehensive metabolic panel - Parathyroid hormone, intact (no Ca) - VITAMIN D 25 Hydroxy (Vit-D Deficiency, Fractures) - TSH  6. Vitamin D deficiency Check vitamin D today.  Could be contributing to fatigue and depressed mood - VITAMIN D 25 Hydroxy (Vit-D Deficiency, Fractures)  7. Iron deficiency anemia, unspecified iron deficiency anemia type Repeat CBC and iron labs - CBC with Differential/Platelet - TSH - Iron, TIBC and Ferritin Panel  8. Need for influenza vaccination Flu shot given today. - Flu vaccine trivalent PF, 6mos and older(Flulaval,Afluria,Fluarix,Fluzone)     Return in about 6 weeks (around 07/07/2023) for OV f/u chronic conditions.    Alvester Morin, PA-C, DMSc, Nutritionist [facility]  [facility] [phone]

## 2023-05-26 NOTE — Telephone Encounter (Signed)
Pt called asking for her pain medication refill

## 2023-05-26 NOTE — Patient Instructions (Signed)
-  It was a pleasure to see you today! Please review your visit summary for helpful information -Lab results are usually available within 1-2 days and we will call once reviewed -I would encourage you to follow your care via MyChart where you can access lab results, notes, messages, and more -If you feel that we did a nice job today, please complete your after-visit survey and leave us a Google review! Your CMA today was Kieandra and your provider was Dan Waddell, PA-C, DMSc -Please return for follow-up in about 6 weeks  

## 2023-05-27 ENCOUNTER — Ambulatory Visit (INDEPENDENT_AMBULATORY_CARE_PROVIDER_SITE_OTHER): Payer: Self-pay

## 2023-05-27 ENCOUNTER — Encounter: Payer: Self-pay | Admitting: Podiatry

## 2023-05-27 ENCOUNTER — Ambulatory Visit (INDEPENDENT_AMBULATORY_CARE_PROVIDER_SITE_OTHER): Payer: MEDICAID | Admitting: Podiatry

## 2023-05-27 VITALS — Ht 61.0 in | Wt 199.0 lb

## 2023-05-27 DIAGNOSIS — M2042 Other hammer toe(s) (acquired), left foot: Secondary | ICD-10-CM

## 2023-05-27 DIAGNOSIS — Z9889 Other specified postprocedural states: Secondary | ICD-10-CM

## 2023-05-27 LAB — IRON,TIBC AND FERRITIN PANEL
Ferritin: 29 ng/mL (ref 15–150)
Iron Saturation: 16 % (ref 15–55)
Iron: 62 ug/dL (ref 27–159)
Total Iron Binding Capacity: 384 ug/dL (ref 250–450)
UIBC: 322 ug/dL (ref 131–425)

## 2023-05-27 LAB — CBC WITH DIFFERENTIAL/PLATELET
Basophils Absolute: 0.1 10*3/uL (ref 0.0–0.2)
Basos: 1 %
EOS (ABSOLUTE): 0 10*3/uL (ref 0.0–0.4)
Eos: 1 %
Hematocrit: 38.1 % (ref 34.0–46.6)
Hemoglobin: 12 g/dL (ref 11.1–15.9)
Immature Grans (Abs): 0 10*3/uL (ref 0.0–0.1)
Immature Granulocytes: 0 %
Lymphocytes Absolute: 1.4 10*3/uL (ref 0.7–3.1)
Lymphs: 29 %
MCH: 29 pg (ref 26.6–33.0)
MCHC: 31.5 g/dL (ref 31.5–35.7)
MCV: 92 fL (ref 79–97)
Monocytes Absolute: 0.3 10*3/uL (ref 0.1–0.9)
Monocytes: 7 %
Neutrophils Absolute: 3 10*3/uL (ref 1.4–7.0)
Neutrophils: 62 %
Platelets: 248 10*3/uL (ref 150–450)
RBC: 4.14 x10E6/uL (ref 3.77–5.28)
RDW: 13.1 % (ref 11.7–15.4)
WBC: 4.9 10*3/uL (ref 3.4–10.8)

## 2023-05-27 LAB — VITAMIN D 25 HYDROXY (VIT D DEFICIENCY, FRACTURES): Vit D, 25-Hydroxy: 29.2 ng/mL — ABNORMAL LOW (ref 30.0–100.0)

## 2023-05-27 LAB — COMPREHENSIVE METABOLIC PANEL
ALT: 13 [IU]/L (ref 0–32)
AST: 15 [IU]/L (ref 0–40)
Albumin: 4 g/dL (ref 3.9–4.9)
Alkaline Phosphatase: 88 [IU]/L (ref 44–121)
BUN/Creatinine Ratio: 31 — ABNORMAL HIGH (ref 9–23)
BUN: 25 mg/dL — ABNORMAL HIGH (ref 6–24)
Bilirubin Total: 0.2 mg/dL (ref 0.0–1.2)
CO2: 23 mmol/L (ref 20–29)
Calcium: 8.9 mg/dL (ref 8.7–10.2)
Chloride: 104 mmol/L (ref 96–106)
Creatinine, Ser: 0.8 mg/dL (ref 0.57–1.00)
Globulin, Total: 1.9 g/dL (ref 1.5–4.5)
Glucose: 83 mg/dL (ref 70–99)
Potassium: 4.7 mmol/L (ref 3.5–5.2)
Sodium: 139 mmol/L (ref 134–144)
Total Protein: 5.9 g/dL — ABNORMAL LOW (ref 6.0–8.5)
eGFR: 92 mL/min/{1.73_m2} (ref >59)

## 2023-05-27 LAB — PARATHYROID HORMONE, INTACT (NO CA): PTH: 36 pg/mL (ref 15–65)

## 2023-05-27 LAB — TSH: TSH: 2.21 u[IU]/mL (ref 0.450–4.500)

## 2023-05-27 MED ORDER — OXYCODONE-ACETAMINOPHEN 10-325 MG PO TABS
1.0000 | ORAL_TABLET | ORAL | 0 refills | Status: DC | PRN
Start: 2023-05-27 — End: 2023-06-03

## 2023-05-27 NOTE — Progress Notes (Signed)
Subjective:  Patient ID: Norma [name], female    DOB: May 09, 1976,  MRN: [id number]  Chief Complaint  Patient presents with   Routine Post Op    Post op visit# 1, pt states she is still in a lot of pain    DOS: 05/19/2023 Procedure: Left digit hammertoe correction with second metatarsophalangeal joint capsulotomy  47 y.o. female returns for post-op check.  Patient states that she is doing well.  Pain is not controlled.  Currently on pain medication.  Weightbearing as tolerated with Cam boot  Review of Systems: Negative except as noted in the HPI. Denies N/V/F/Ch.  Past Medical History:  Diagnosis Date   AKI (acute kidney injury) (HCC) 06/04/2016   Anxiety    Bacteremia 06/04/2016   Choledocholithiasis 03/01/2020   Cholelithiasis 03/02/2020   COPD (chronic obstructive pulmonary disease) (HCC)    Depression    Diarrhea 06/04/2016   GERD (gastroesophageal reflux disease)    History of kidney stones    Hypertension    Hypokalemia 06/04/2016   IBS (irritable bowel syndrome)    Nausea and vomiting 06/04/2016   Positive blood culture 06/04/2016   Rectal fissure 10/04/2015   Surgery, elective 09/22/2019    Current Outpatient Medications:    ADVAIR DISKUS 250-50 MCG/ACT AEPB, Inhale 1 puff into the lungs 2 (two) times daily., Disp: , Rfl:    albuterol (VENTOLIN HFA) 108 (90 Base) MCG/ACT inhaler, Inhale 2 puffs into the lungs every 6 (six) hours as needed for wheezing or shortness of breath., Disp: 8 g, Rfl: 2   amLODipine (NORVASC) 5 MG tablet, Take 5 mg by mouth daily., Disp: , Rfl:    ARIPiprazole (ABILIFY) 2 MG tablet, Take 2 mg by mouth daily., Disp: , Rfl:    clonazePAM (KLONOPIN) 1 MG tablet, Take 1 tablet (1 mg total) by mouth 3 (three) times daily., Disp: 90 tablet, Rfl: 2   FLUoxetine (PROZAC) 40 MG capsule, Take 1 capsule (40 mg total) by mouth daily., Disp: 90 capsule, Rfl: 1   fluticasone (FLONASE) 50 MCG/ACT nasal spray, Place 1 spray into both nostrils daily.,  Disp: , Rfl:    ibuprofen (ADVIL) 800 MG tablet, TAKE ONE TABLET (800 MG TOTAL) BY MOUTH EVERY SIX HOURS AS NEEDED., Disp: 60 tablet, Rfl: 1   lansoprazole (PREVACID) 30 MG capsule, Take 30 mg by mouth 2 (two) times daily., Disp: , Rfl:    lisinopril (ZESTRIL) 20 MG tablet, Take 20 mg by mouth daily., Disp: , Rfl:    nystatin cream (MYCOSTATIN), Apply 1 Application topically as needed., Disp: , Rfl:    oxybutynin (DITROPAN) 5 MG tablet, Take 1 tablet (5 mg total) by mouth every 8 (eight) hours as needed for bladder spasms., Disp: 30 tablet, Rfl: 0   oxyCODONE-acetaminophen (PERCOCET) 10-325 MG tablet, Take 1 tablet by mouth every 4 (four) hours as needed for pain., Disp: 30 tablet, Rfl: 0   oxyCODONE-acetaminophen (PERCOCET) 10-325 MG tablet, Take 1 tablet by mouth every 4 (four) hours as needed for pain., Disp: 30 tablet, Rfl: 0   SPIRIVA RESPIMAT 1.25 MCG/ACT AERS, 2 puffs daily, Disp: , Rfl:    SUMAtriptan (IMITREX) 50 MG tablet, Take 1 tablet (50 mg total) by mouth daily as needed for migraine. May repeat in 2 hours if headache persists or recurs., Disp: 10 tablet, Rfl: 2   tamsulosin (FLOMAX) 0.4 MG CAPS capsule, Take 1 capsule (0.4 mg total) by mouth daily., Disp: 30 capsule, Rfl: 0  Social History   Tobacco Use  Smoking Status Former   Current packs/day: 0.00   Average packs/day: 2.0 packs/day for 30.0 years (60.0 ttl pk-yrs)   Types: Cigarettes   Start date: 07/15/1992   Quit date: 07/15/2022   Years since quitting: 0.8  Smokeless Tobacco Never    Allergies  Allergen Reactions   Sulfa Antibiotics Hives   Objective:  There were no vitals filed for this visit. Body mass index is 37.6 kg/m. Constitutional Well developed. Well nourished.  Vascular Foot warm and well perfused. Capillary refill normal to all digits.   Neurologic Normal speech. Oriented to person, place, and time. Epicritic sensation to light touch grossly present bilaterally.  Dermatologic Skin healing well  without signs of infection. Skin edges well coapted without signs of infection.  Orthopedic: Tenderness to palpation noted about the surgical site.   Radiographs: 3 views of skeletally mature left foot: Good correction of deformity noted hardware intact no signs of backing out or loosening noted. Assessment:   1. 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 Status post foot surgery    Plan:  Patient was evaluated and treated and all questions answered.  S/p foot surgery left -Progressing as expected post-operatively. -XR: See above -WB Status: Weightbearing as tolerated in surgical shoe -Sutures: Intact.  No clinical signs of dehiscence noted no complication noted. -Medications: None -Foot redressed.  No follow-ups on file.

## 2023-05-27 NOTE — Progress Notes (Unsigned)
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 There is no evidence of fracture or dislocation. There is no evidence of arthropathy or other focal bone abnormality. Soft tissues are unremarkable.  IMPRESSION: Negative.   Electronically Signed By: Francene Boyers M.D. On: 05/06/2018 10:50   Complexity Note: Imaging results reviewed.                         ROS  Cardiovascular: {Hx; Cardiovascular History:210120525} Pulmonary or Respiratory: {Hx; Pumonary and/or Respiratory History:210120523} Neurological: {Hx; Neurological:210120504} Psychological-Psychiatric: {Hx; Psychological-Psychiatric History:210120512} Gastrointestinal: {Hx; Gastrointestinal:210120527} Genitourinary: {Hx;  Genitourinary:210120506} Hematological: {Hx; Hematological:210120510} Endocrine: {Hx; Endocrine history:210120509} Rheumatologic: {Hx; Rheumatological:210120530} Musculoskeletal: {Hx; Musculoskeletal:210120528} Work History: {Hx; Work history:210120514}  Allergies  Ms. Geddes is allergic to sulfa antibiotics.  Laboratory Chemistry Profile   Renal Lab Results  Component Value Date   BUN 14 04/09/2023   CREATININE 0.72 04/09/2023   BCR 27 (H) 08/07/2021   GFRAA >60 03/03/2020   GFRNONAA >60 04/09/2023   SPECGRAV 1.025 09/18/2022   PHUR 5.5 09/18/2022   PROTEINUR 2+ (A) 09/18/2022     Electrolytes Lab Results  Component Value Date   NA 139 04/09/2023   K 3.2 (L) 04/09/2023   CL 109 04/09/2023   CALCIUM 8.5 (L) 04/09/2023   MG 2.1 09/28/2019     Hepatic Lab Results  Component Value Date   AST 12 08/07/2021   ALT 9 08/07/2021   ALBUMIN 3.6 (L) 08/07/2021   ALKPHOS 65 08/07/2021   LIPASE 40 06/21/2021     ID Lab Results  Component Value Date   HIV Non Reactive 03/01/2020   SARSCOV2NAA NEGATIVE 04/22/2021   MRSAPCR NEGATIVE 03/03/2020   PREGTESTUR NEGATIVE 09/15/2015     Bone Lab Results  Component Value Date   25OHVITD1 38 09/28/2019   25OHVITD2 <1.0 09/28/2019   25OHVITD3 38 09/28/2019     Endocrine Lab Results  Component Value Date   GLUCOSE 94 04/09/2023   GLUCOSEU Negative 09/18/2022   HGBA1C 5.4 03/01/2020   TSH 1.46 01/03/2022     Neuropathy Lab Results  Component Value Date   VITAMINB12 698 08/07/2021   HGBA1C 5.4 03/01/2020   HIV Non Reactive 03/01/2020     CNS No results found for: "COLORCSF", "APPEARCSF", "RBCCOUNTCSF", "WBCCSF", "POLYSCSF", "LYMPHSCSF", "EOSCSF", "PROTEINCSF", "GLUCCSF", "JCVIRUS", "CSFOLI", "IGGCSF", "LABACHR", "ACETBL"   Inflammation (CRP: Acute  ESR: Chronic) Lab Results  Component Value Date   CRP 1.0 (H) 09/28/2019   ESRSEDRATE 10 09/28/2019   LATICACIDVEN 1.18 06/04/2016     Rheumatology No results  found for: "RF", "ANA", "LABURIC", "URICUR", "LYMEIGGIGMAB", "LYMEABIGMQN", "HLAB27"   Coagulation Lab Results  Component Value Date   PLT 242 04/09/2023     Cardiovascular Lab Results  Component Value Date   TROPONINI <0.03 04/01/2016   HGB 11.4 (L) 04/09/2023   HCT 34.1 (L) 04/09/2023     Screening Lab Results  Component Value Date   SARSCOV2NAA NEGATIVE 04/22/2021   MRSAPCR NEGATIVE 03/03/2020   HIV Non Reactive 03/01/2020   PREGTESTUR NEGATIVE 09/15/2015     Cancer Lab Results  Component Value Date   CA125 120.7 (H) 09/20/2015     Allergens No results found for: "ALMOND", "APPLE", "ASPARAGUS", "AVOCADO", "BANANA", "BARLEY", "BASIL", "BAYLEAF", "GREENBEAN", "LIMABEAN", "WHITEBEAN", "BEEFIGE", "REDBEET", "BLUEBERRY", "BROCCOLI", "CABBAGE", "MELON", "CARROT", "CASEIN", "CASHEWNUT", "CAULIFLOWER", "CELERY"     Note: Lab results reviewed.  PFSH  Drug: Ms. Sankovich  reports no history of drug use. Alcohol:  reports no history of alcohol  use. Tobacco:  reports that she quit smoking about 10 months ago. Her smoking use included cigarettes. She started smoking about 30 years ago. She has a 60 pack-year smoking history. She has never used smokeless tobacco. Medical:  has a past medical history of AKI (acute kidney injury) (HCC) (06/04/2016), Anxiety, Bacteremia (06/04/2016), Choledocholithiasis (03/01/2020), Cholelithiasis (03/02/2020), COPD (chronic obstructive pulmonary disease) (HCC), Depression, Diarrhea (06/04/2016), GERD (gastroesophageal reflux disease), History of kidney stones, Hypertension, Hypokalemia (06/04/2016), IBS (irritable bowel syndrome), Nausea and vomiting (06/04/2016), Positive blood culture (06/04/2016), Rectal fissure (10/04/2015), and Surgery, elective (09/22/2019). Family: family history includes Birth defects in her daughter; COPD in her mother; Diabetes in her father; Heart disease in her maternal aunt and sister; Hypertension in her father; Kidney disease  in her daughter; Seizures in her maternal aunt; Stroke in her father.  Past Surgical History:  Procedure Laterality Date   ABDOMINAL HYSTERECTOMY N/A 11/09/2015   Procedure: HYSTERECTOMY ABDOMINAL;  Surgeon: Tilda Burrow, MD;  Location: [facility];  Service: Gynecology;  Laterality: N/A;   CHOLECYSTECTOMY N/A 03/03/2020   Procedure: LAPAROSCOPIC CHOLECYSTECTOMY;  Surgeon: Franky Macho, MD;  Location: [facility];  Service: General;  Laterality: N/A;   COLONOSCOPY WITH PROPOFOL N/A 03/29/2021   Procedure: COLONOSCOPY WITH PROPOFOL;  Surgeon: Pasty Spillers, MD;  Location: [facility];  Service: Endoscopy;  Laterality: N/A;   COLONOSCOPY WITH PROPOFOL N/A 09/05/2021   Procedure: COLONOSCOPY WITH PROPOFOL;  Surgeon: Toney Reil, MD;  Location[facility];  Service: Gastroenterology;  Laterality: N/A;   CYSTOSCOPY WITH BIOPSY N/A 09/30/2022   Procedure: CYSTOSCOPY WITH BLADDER BIOPSY;  Surgeon: Vanna Scotland, MD;  Location: [facility];  Service: Urology;  Laterality: N/A;   CYSTOSCOPY/URETEROSCOPY/HOLMIUM LASER/STENT PLACEMENT Right 09/30/2022   Procedure: CYSTOSCOPY/URETEROSCOPY/HOLMIUM LASER/STENT PLACEMENT;  Surgeon: Vanna Scotland, MD;  Location: [facility];  Service: Urology;  Laterality: Right;   DILATION AND CURETTAGE OF UTERUS     ESOPHAGOGASTRODUODENOSCOPY (EGD) WITH PROPOFOL N/A 09/05/2021   Procedure: ESOPHAGOGASTRODUODENOSCOPY (EGD) WITH PROPOFOL;  Surgeon: Toney Reil, MD;  Location: [facility] ENDOSCOPY;  Service: Gastroenterology;  Laterality: N/A;   SALPINGOOPHORECTOMY Bilateral 11/09/2015   Procedure: SALPINGO OOPHORECTOMY;  Surgeon: Tilda Burrow, MD;  Location: [facility];  Service: Gynecology;  Laterality: Bilateral;   Active Ambulatory Problems    Diagnosis Date Noted   Endometriosis of pelvis 11/16/2015   Chronic low back pain (1ry area of Pain) (Bilateral) (L>R) w/o sciatica 09/22/2019   Chronic neck pain (2ry area of Pain) (Bilateral) (L>R) 09/22/2019   Chronic  lower extremity pain (3ry area of Pain) (Bilateral) (L>R) 09/22/2019   Chronic knee pain (Bilateral) (L>R) 09/22/2019   Chronic shoulder pain (Right) 09/22/2019   Chronic pain syndrome 09/22/2019   HTN (hypertension) 03/01/2020   Class 1 obesity due to excess calories without serious comorbidity with body mass index (BMI) of 34.0 to 34.9 in adult    Tobacco abuse    Chronic obstructive pulmonary disease (HCC)    Duodenal ulcer    Gastric erosion    History of colonic polyps    Lumbar spondylosis 03/20/2023   Long-term current use of benzodiazepine 03/25/2023   Long term prescription opiate use 03/25/2023   Physical tolerance to opiate drug 03/25/2023   Moderate episode of recurrent major depressive disorder (HCC) 05/26/2023   Generalized anxiety disorder with panic attacks 05/26/2023   Iron deficiency anemia 05/26/2023   Hypocalcemia 05/26/2023   Migraine without status migrainosus, not intractable 05/26/2023   Vitamin D deficiency 05/26/2023   Resolved Ambulatory Problems  Diagnosis Date Noted   Pelvic mass 09/20/2015   Rectal fissure 10/04/2015   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 11/09/2015   Endometrioma of ovary bilateral 11/10/2015   Bacteremia 06/04/2016   UTI (urinary tract infection) 06/04/2016   Abdominal pain, epigastric 06/04/2016   Nausea and vomiting 06/04/2016   Hypokalemia 06/04/2016   AKI (acute kidney injury) (HCC) 06/04/2016   Positive blood culture 06/04/2016   Diarrhea 06/04/2016   Cervicalgia 09/22/2019   Surgery, elective 09/22/2019   Disorder of skeletal system 09/22/2019   Problems influencing health status 09/22/2019   Diabetes (HCC) 03/01/2020   Choledocholithiasis 03/01/2020   Cholelithiasis 03/02/2020   Family history of colon cancer requiring screening colonoscopy    Pharmacologic therapy 03/25/2023   Chronic prescription opiate use 03/25/2023   Drug tolerance, sequela 03/25/2023   Past Medical  History:  Diagnosis Date   Anxiety    COPD (chronic obstructive pulmonary disease) (HCC)    Depression    GERD (gastroesophageal reflux disease)    History of kidney stones    Hypertension    IBS (irritable bowel syndrome)    Constitutional Exam  General appearance: Well nourished, well developed, and well hydrated. In no apparent acute distress There were no vitals filed for this visit. BMI Assessment: Estimated body mass index is 37.6 kg/m as calculated from the following:   Height as of 05/26/23: 5\' 1"  (1.549 m).   Weight as of 05/26/23: 199 lb (90.3 kg).  BMI interpretation table: BMI level Category Range association with higher incidence of chronic pain  <18 kg/m2 Underweight   18.5-24.9 kg/m2 Ideal body weight   25-29.9 kg/m2 Overweight Increased incidence by 20%  30-34.9 kg/m2 Obese (Class I) Increased incidence by 68%  35-39.9 kg/m2 Severe obesity (Class II) Increased incidence by 136%  >40 kg/m2 Extreme obesity (Class III) Increased incidence by 254%   Patient's current BMI Ideal Body weight  There is no height or weight on file to calculate BMI. Ideal body weight: 47.8 kg (105 lb 6.1 oz) Adjusted ideal body weight: 64.8 kg (142 lb 13.2 oz)   BMI Readings from Last 4 Encounters:  05/26/23 37.60 kg/m  04/29/23 35.82 kg/m  02/17/23 35.94 kg/m  11/04/22 33.52 kg/m   Wt Readings from Last 4 Encounters:  05/26/23 199 lb (90.3 kg)  04/29/23 189 lb 9.5 oz (86 kg)  02/17/23 190 lb 3.2 oz (86.3 kg)  11/04/22 177 lb 6.4 oz (80.5 kg)    Psych/Mental status: Alert, oriented x 3 (person, place, & time)       Eyes: PERLA Respiratory: No evidence of acute respiratory distress  Assessment  Primary Diagnosis & Pertinent Problem List: {There were no encounter diagnoses. (Refresh or delete this SmartLink)}  Visit Diagnosis (New problems to examiner): No diagnosis found. Plan of Care (Initial workup plan)  Note: Ms. Verser was reminded that as per protocol, today's  visit has been an evaluation only. We have not taken over the patient's controlled substance management.  Problem-specific plan: No problem-specific Assessment & Plan notes found for this encounter.  Lab Orders  No laboratory test(s) ordered today   Imaging Orders  No imaging studies ordered today   Referral Orders  No referral(s) requested today   Procedure Orders    No procedure(s) ordered today   Pharmacotherapy (current): Medications ordered:  No orders of the defined types were placed in this encounter.  Medications administered during this visit: Nissi L. Pangan had no medications administered during this visit.  Analgesic Pharmacotherapy:  Opioid Analgesics: For patients currently taking or requesting to take opioid analgesics, in accordance with [facility] Guidelines, we will assess their risks and indications for the use of these substances. After completing our evaluation, we may offer recommendations, but we no longer take patients for medication management. The prescribing physician will ultimately decide, based on his/her training and level of comfort whether to adopt any of the recommendations, including whether or not to prescribe such medicines.  Membrane stabilizer: To be determined at a later time  Muscle relaxant: To be determined at a later time  NSAID: To be determined at a later time  Other analgesic(s): To be determined at a later time   Interventional management options: Ms. Mulay was informed that there is no guarantee that she would be a candidate for interventional therapies. The decision will be based on the results of diagnostic studies, as well as Ms. Odden's risk profile.  Procedure(s) under consideration:  Pending results of ordered studies      Interventional Therapies  Risk Factors  Considerations  Medical Comorbidities:     Planned  Pending:      Under consideration:   Pending   Completed:   None at this  time   Therapeutic  Palliative (PRN) options:   None established   Completed by other providers:   None reported       Provider-requested follow-up: No follow-ups on file.  Future Appointments  Date Time Provider Department Center  05/27/2023  2:15 PM Candelaria Stagers, DPM [facility]  05/28/2023  9:00 AM Delano Metz, MD [facility] None  06/10/2023  2:45 PM Candelaria Stagers, DPM [facility]  10/29/2023  1:45 PM Vanna Scotland, MD [facility] None    Duration of encounter: *** minutes.  Total time on encounter, as per AMA guidelines included both the face-to-face and non-face-to-face time personally spent by the physician and/or other qualified health care professional(s) on the day of the encounter (includes time in activities that require the physician or other qualified health care professional and does not include time in activities normally performed by clinical staff). Physician's time may include the following activities when performed: Preparing to see the patient (e.g., pre-charting review of records, searching for previously ordered imaging, lab work, and nerve conduction tests) Review of prior analgesic pharmacotherapies. Reviewing PMP Interpreting ordered tests (e.g., lab work, imaging, nerve conduction tests) Performing post-procedure evaluations, including interpretation of diagnostic procedures Obtaining and/or reviewing separately obtained history Performing a medically appropriate examination and/or evaluation Counseling and educating the patient/family/caregiver Ordering medications, tests, or procedures Referring and communicating with other health care professionals (when not separately reported) Documenting clinical information in the electronic or other health record Independently interpreting results (not separately reported) and communicating results to the patient/ family/caregiver Care coordination (not separately reported)  Note by:  Oswaldo Done, MD (TTS technology used. I apologize for any typographical errors that were not detected and corrected.) Date: 05/28/2023; Time: 11:15 AM

## 2023-05-27 NOTE — Addendum Note (Signed)
Addended by: Nicholes Rough on: 05/27/2023 08:18 AM   Modules accepted: Orders

## 2023-05-28 ENCOUNTER — Ambulatory Visit: Payer: Self-pay | Attending: Pain Medicine | Admitting: Pain Medicine

## 2023-05-28 DIAGNOSIS — Z91199 Patient's noncompliance with other medical treatment and regimen due to unspecified reason: Secondary | ICD-10-CM | POA: Insufficient documentation

## 2023-05-28 DIAGNOSIS — G8929 Other chronic pain: Secondary | ICD-10-CM

## 2023-05-30 ENCOUNTER — Telehealth: Payer: Self-pay | Admitting: Podiatry

## 2023-05-30 NOTE — Telephone Encounter (Signed)
Pt called checking to see if you would call her in pain medication. It was just refilled on 05/27/23 and I explained that and she was just wanting to get it refilled before she ran out.

## 2023-05-30 NOTE — Telephone Encounter (Signed)
Left message for pt that Dr Allena Katz stated he could not do that the pharmacy will not allow it.Has to be day before or day of.

## 2023-06-02 ENCOUNTER — Telehealth: Payer: Self-pay

## 2023-06-02 NOTE — Telephone Encounter (Signed)
Patient called, reporting that her dressing came off her foot. She was concerned that she needed to come in to have it rewrapped. Advised OK for her to redress/rewrap her foot herself. Advised to please call back with any questions or problems. Thanks

## 2023-06-03 ENCOUNTER — Telehealth: Payer: Self-pay | Admitting: Podiatry

## 2023-06-03 MED ORDER — OXYCODONE-ACETAMINOPHEN 10-325 MG PO TABS: 1.0000 | ORAL_TABLET | ORAL | 0 refills | Status: DC | PRN

## 2023-06-03 NOTE — Telephone Encounter (Signed)
Patient called asking for pain medication refill.

## 2023-06-03 NOTE — Addendum Note (Signed)
Addended by: Nicholes Rough on: 06/03/2023 02:25 PM   Modules accepted: Orders

## 2023-06-04 ENCOUNTER — Encounter: Payer: Self-pay | Admitting: Podiatry

## 2023-06-09 ENCOUNTER — Telehealth: Payer: Self-pay | Admitting: Podiatry

## 2023-06-09 NOTE — Telephone Encounter (Signed)
Pt called asking if you were going to call in any medication for her today, I did tell her that Dr Allena Katz was not in the office today so she would probably not hear back until tomorrow. She has appt tomorrow as well.

## 2023-06-10 ENCOUNTER — Ambulatory Visit (INDEPENDENT_AMBULATORY_CARE_PROVIDER_SITE_OTHER): Payer: MEDICAID | Admitting: Podiatry

## 2023-06-10 ENCOUNTER — Encounter: Payer: Self-pay | Admitting: Podiatry

## 2023-06-10 DIAGNOSIS — M2042 Other hammer toe(s) (acquired), left foot: Secondary | ICD-10-CM

## 2023-06-10 DIAGNOSIS — Z9889 Other specified postprocedural states: Secondary | ICD-10-CM

## 2023-06-10 MED ORDER — OXYCODONE-ACETAMINOPHEN 10-325 MG PO TABS: 1.0000 | ORAL_TABLET | ORAL | 0 refills | Status: DC | PRN

## 2023-06-10 NOTE — Progress Notes (Signed)
Subjective:  Patient ID: Norma [name], female    DOB: [birth year],  MRN: [id number]  Chief Complaint  Patient presents with   Routine Post Op    "It's hurting, the pain level is about an eight."    DOS: 05/19/2023 Procedure: Left digit hammertoe correction with second metatarsophalangeal joint capsulotomy  47 y.o. female returns for post-op check.  Patient states that she is doing well.  Pain is not controlled.  Currently on pain medication.  Weightbearing as tolerated with Cam boot  Review of Systems: Negative except as noted in the HPI. Denies N/V/F/Ch.  Past Medical History:  Diagnosis Date   AKI (acute kidney injury) (HCC) 06/04/2016   Anxiety    Bacteremia 06/04/2016   Choledocholithiasis 03/01/2020   Cholelithiasis 03/02/2020   COPD (chronic obstructive pulmonary disease) (HCC)    Depression    Diarrhea 06/04/2016   GERD (gastroesophageal reflux disease)    History of kidney stones    Hypertension    Hypokalemia 06/04/2016   IBS (irritable bowel syndrome)    Nausea and vomiting 06/04/2016   Positive blood culture 06/04/2016   Rectal fissure 10/04/2015   Surgery, elective 09/22/2019    Current Outpatient Medications:    ADVAIR DISKUS 250-50 MCG/ACT AEPB, Inhale 1 puff into the lungs 2 (two) times daily., Disp: , Rfl:    albuterol (VENTOLIN HFA) 108 (90 Base) MCG/ACT inhaler, Inhale 2 puffs into the lungs every 6 (six) hours as needed for wheezing or shortness of breath., Disp: 8 g, Rfl: 2   amLODipine (NORVASC) 5 MG tablet, Take 5 mg by mouth daily., Disp: , Rfl:    ARIPiprazole (ABILIFY) 2 MG tablet, Take 2 mg by mouth daily., Disp: , Rfl:    clonazePAM (KLONOPIN) 1 MG tablet, Take 1 tablet (1 mg total) by mouth 3 (three) times daily., Disp: 90 tablet, Rfl: 2   FLUoxetine (PROZAC) 40 MG capsule, Take 1 capsule (40 mg total) by mouth daily., Disp: 90 capsule, Rfl: 1   fluticasone (FLONASE) 50 MCG/ACT nasal spray, Place 1 spray into both nostrils daily., Disp: ,  Rfl:    ibuprofen (ADVIL) 800 MG tablet, TAKE ONE TABLET (800 MG TOTAL) BY MOUTH EVERY SIX HOURS AS NEEDED., Disp: 60 tablet, Rfl: 1   lansoprazole (PREVACID) 30 MG capsule, Take 30 mg by mouth 2 (two) times daily., Disp: , Rfl:    lisinopril (ZESTRIL) 20 MG tablet, Take 20 mg by mouth daily., Disp: , Rfl:    nystatin cream (MYCOSTATIN), Apply 1 Application topically as needed., Disp: , Rfl:    oxybutynin (DITROPAN) 5 MG tablet, Take 1 tablet (5 mg total) by mouth every 8 (eight) hours as needed for bladder spasms., Disp: 30 tablet, Rfl: 0   oxyCODONE-acetaminophen (PERCOCET) 10-325 MG tablet, Take 1 tablet by mouth every 4 (four) hours as needed for pain., Disp: 30 tablet, Rfl: 0   SPIRIVA RESPIMAT 1.25 MCG/ACT AERS, 2 puffs daily, Disp: , Rfl:    SUMAtriptan (IMITREX) 50 MG tablet, Take 1 tablet (50 mg total) by mouth daily as needed for migraine. May repeat in 2 hours if headache persists or recurs., Disp: 10 tablet, Rfl: 2   tamsulosin (FLOMAX) 0.4 MG CAPS capsule, Take 1 capsule (0.4 mg total) by mouth daily., Disp: 30 capsule, Rfl: 0   oxyCODONE-acetaminophen (PERCOCET) 10-325 MG tablet, Take 1 tablet by mouth every 4 (four) hours as needed for pain., Disp: 30 tablet, Rfl: 0  Social History   Tobacco Use  Smoking Status Former  Current packs/day: 0.00   Average packs/day: 2.0 packs/day for 30.0 years (60.0 ttl pk-yrs)   Types: Cigarettes   Start date: 07/15/1992   Quit date: 07/15/2022   Years since quitting: 0.9  Smokeless Tobacco Never    Allergies  Allergen Reactions   Sulfa Antibiotics Hives   Objective:  There were no vitals filed for this visit. There is no height or weight on file to calculate BMI. Constitutional Well developed. Well nourished.  Vascular Foot warm and well perfused. Capillary refill normal to all digits.   Neurologic Normal speech. Oriented to person, place, and time. Epicritic sensation to light touch grossly present bilaterally.  Dermatologic Skin  completely reepithelialized.  No signs of dehiscence noted no complication noted.  Orthopedic: No tenderness to palpation noted about the surgical site.   Radiographs: 3 views of skeletally mature left foot: Good correction of deformity noted hardware intact no signs of backing out or loosening noted. Assessment:   No diagnosis found.  Plan:  Patient was evaluated and treated and all questions answered.  S/p foot surgery left -Progressing as expected post-operatively. -XR: See above -WB Status: Weightbearing as tolerated in surgical shoe -Sutures: Removed.  No clinical signs of dehiscence noted no complication noted. -Medications: None -Will plan on removing the pain in 6 weeks.  No follow-ups on file.

## 2023-06-16 ENCOUNTER — Other Ambulatory Visit: Payer: Self-pay | Admitting: Podiatry

## 2023-06-17 ENCOUNTER — Telehealth: Payer: Self-pay | Admitting: Podiatry

## 2023-06-17 MED ORDER — OXYCODONE-ACETAMINOPHEN 10-325 MG PO TABS: 1.0000 | ORAL_TABLET | ORAL | 0 refills | Status: DC | PRN

## 2023-06-17 NOTE — Addendum Note (Signed)
Addended by: Nicholes Rough on: 06/17/2023 03:55 PM   Modules accepted: Orders

## 2023-06-17 NOTE — Telephone Encounter (Signed)
Patient called asking for her pain medication refill

## 2023-06-19 ENCOUNTER — Encounter: Payer: Self-pay | Admitting: Podiatry

## 2023-06-19 ENCOUNTER — Ambulatory Visit (INDEPENDENT_AMBULATORY_CARE_PROVIDER_SITE_OTHER): Payer: MEDICAID | Admitting: Podiatry

## 2023-06-19 VITALS — Ht 61.0 in | Wt 199.0 lb

## 2023-06-19 DIAGNOSIS — Z9889 Other specified postprocedural states: Secondary | ICD-10-CM

## 2023-06-19 DIAGNOSIS — M2042 Other hammer toe(s) (acquired), left foot: Secondary | ICD-10-CM

## 2023-06-19 MED ORDER — OXYCODONE-ACETAMINOPHEN 5-325 MG PO TABS
1.0000 | ORAL_TABLET | ORAL | 0 refills | Status: DC | PRN
Start: 2023-06-19 — End: 2023-07-17

## 2023-06-19 NOTE — Progress Notes (Signed)
Subjective:  Patient ID: Norma [name], female    DOB: 09/27/1975,  MRN: [id number]  Chief Complaint  Patient presents with   Post-op Problem    Pt is here due to post op problem pt believes the pin in her toe is broken.    DOS: 05/19/2023 Procedure: Left digit hammertoe correction with second metatarsophalangeal joint capsulotomy  47 y.o. female returns for post-op check.  Patient states that she is doing well.  Pain is not controlled.  Currently on pain medication.  Weightbearing as tolerated with Cam boot  Review of Systems: Negative except as noted in the HPI. Denies N/V/F/Ch.  Past Medical History:  Diagnosis Date   AKI (acute kidney injury) (HCC) 06/04/2016   Anxiety    Bacteremia 06/04/2016   Choledocholithiasis 03/01/2020   Cholelithiasis 03/02/2020   COPD (chronic obstructive pulmonary disease) (HCC)    Depression    Diarrhea 06/04/2016   GERD (gastroesophageal reflux disease)    History of kidney stones    Hypertension    Hypokalemia 06/04/2016   IBS (irritable bowel syndrome)    Nausea and vomiting 06/04/2016   Positive blood culture 06/04/2016   Rectal fissure 10/04/2015   Surgery, elective 09/22/2019    Current Outpatient Medications:    ADVAIR DISKUS 250-50 MCG/ACT AEPB, Inhale 1 puff into the lungs 2 (two) times daily., Disp: , Rfl:    albuterol (VENTOLIN HFA) 108 (90 Base) MCG/ACT inhaler, Inhale 2 puffs into the lungs every 6 (six) hours as needed for wheezing or shortness of breath., Disp: 8 g, Rfl: 2   amLODipine (NORVASC) 5 MG tablet, Take 5 mg by mouth daily., Disp: , Rfl:    ARIPiprazole (ABILIFY) 2 MG tablet, Take 2 mg by mouth daily., Disp: , Rfl:    clonazePAM (KLONOPIN) 1 MG tablet, Take 1 tablet (1 mg total) by mouth 3 (three) times daily., Disp: 90 tablet, Rfl: 2   FLUoxetine (PROZAC) 40 MG capsule, Take 1 capsule (40 mg total) by mouth daily., Disp: 90 capsule, Rfl: 1   fluticasone (FLONASE) 50 MCG/ACT nasal spray, Place 1 spray into both  nostrils daily., Disp: , Rfl:    ibuprofen (ADVIL) 800 MG tablet, TAKE ONE TABLET (800 MG TOTAL) BY MOUTH EVERY SIX HOURS AS NEEDED., Disp: 60 tablet, Rfl: 1   lansoprazole (PREVACID) 30 MG capsule, Take 30 mg by mouth 2 (two) times daily., Disp: , Rfl:    lisinopril (ZESTRIL) 20 MG tablet, Take 20 mg by mouth daily., Disp: , Rfl:    nystatin cream (MYCOSTATIN), Apply 1 Application topically as needed., Disp: , Rfl:    oxybutynin (DITROPAN) 5 MG tablet, Take 1 tablet (5 mg total) by mouth every 8 (eight) hours as needed for bladder spasms., Disp: 30 tablet, Rfl: 0   oxyCODONE-acetaminophen (PERCOCET) 10-325 MG tablet, Take 1 tablet by mouth every 4 (four) hours as needed for pain., Disp: 30 tablet, Rfl: 0   oxyCODONE-acetaminophen (PERCOCET) 10-325 MG tablet, Take 1 tablet by mouth every 4 (four) hours as needed for pain., Disp: 30 tablet, Rfl: 0   oxyCODONE-acetaminophen (PERCOCET) 10-325 MG tablet, Take 1 tablet by mouth every 4 (four) hours as needed for pain., Disp: 30 tablet, Rfl: 0   oxyCODONE-acetaminophen (PERCOCET) 5-325 MG tablet, Take 1 tablet by mouth every 4 (four) hours as needed for severe pain (pain score 7-10)., Disp: 30 tablet, Rfl: 0   SPIRIVA RESPIMAT 1.25 MCG/ACT AERS, 2 puffs daily, Disp: , Rfl:    SUMAtriptan (IMITREX) 50 MG tablet, Take 1  tablet (50 mg total) by mouth daily as needed for migraine. May repeat in 2 hours if headache persists or recurs., Disp: 10 tablet, Rfl: 2   tamsulosin (FLOMAX) 0.4 MG CAPS capsule, Take 1 capsule (0.4 mg total) by mouth daily., Disp: 30 capsule, Rfl: 0  Social History   Tobacco Use  Smoking Status Former   Current packs/day: 0.00   Average packs/day: 2.0 packs/day for 30.0 years (60.0 ttl pk-yrs)   Types: Cigarettes   Start date: 07/15/1992   Quit date: 07/15/2022   Years since quitting: 0.9  Smokeless Tobacco Never    Allergies  Allergen Reactions   Sulfa Antibiotics Hives   Objective:  There were no vitals filed for this  visit. Body mass index is 37.6 kg/m. Constitutional Well developed. Well nourished.  Vascular Foot warm and well perfused. Capillary refill normal to all digits.   Neurologic Normal speech. Oriented to person, place, and time. Epicritic sensation to light touch grossly present bilaterally.  Dermatologic Skin completely reepithelialized.  No signs of dehiscence noted no complication noted.  No open wounds or lesion noted.  No pain with range of motion of the second metatarsophalangeal joint noted  Orthopedic: No tenderness to palpation noted about the surgical site.   Radiographs: 3 views of skeletally mature left foot: Good correction of deformity noted broken pin noted.  Well within the second metatarsal.  Will continue to clinically monitor Assessment:   No diagnosis found.  Plan:  Patient was evaluated and treated and all questions answered.  S/p foot surgery left -Clinically healed and doing well.  She has a broken K wire that appears to be well corticated in the bone for now we will plan on leaving it if there is any issues in the future patient will benefit from removal of the pin.  Will discuss this in the future if it continues to bother her. -Weightbearing as tolerated in regular shoes No follow-ups on file.

## 2023-06-24 ENCOUNTER — Telehealth: Payer: Self-pay | Admitting: Podiatry

## 2023-06-24 NOTE — Telephone Encounter (Signed)
Pt called asking if you were going to call in any medication for her today.

## 2023-06-25 ENCOUNTER — Telehealth: Payer: Self-pay | Admitting: Podiatry

## 2023-06-25 NOTE — Telephone Encounter (Signed)
Patient needs oxycodone refilled.

## 2023-07-01 ENCOUNTER — Encounter: Payer: Self-pay | Admitting: Podiatry

## 2023-07-01 ENCOUNTER — Ambulatory Visit (INDEPENDENT_AMBULATORY_CARE_PROVIDER_SITE_OTHER): Payer: MEDICAID | Admitting: Podiatry

## 2023-07-01 ENCOUNTER — Other Ambulatory Visit: Payer: Self-pay | Admitting: Podiatry

## 2023-07-01 VITALS — Ht 61.0 in | Wt 199.0 lb

## 2023-07-01 DIAGNOSIS — M2042 Other hammer toe(s) (acquired), left foot: Secondary | ICD-10-CM

## 2023-07-01 DIAGNOSIS — Z9889 Other specified postprocedural states: Secondary | ICD-10-CM

## 2023-07-01 MED ORDER — OXYCODONE-ACETAMINOPHEN 5-325 MG PO TABS: 1.0000 | ORAL_TABLET | ORAL | 0 refills | Status: DC | PRN

## 2023-07-01 NOTE — Progress Notes (Signed)
Subjective:  Patient ID: Norma [name], female    DOB: [birth year],  MRN: [id number]  Chief Complaint  Patient presents with   Routine Post Op    Pt is here for post op visit states foot pain is a 8    DOS: 05/19/2023 Procedure: Left digit hammertoe correction with second metatarsophalangeal joint capsulotomy  47 y.o. female returns for post-op check.  Patient states that she is doing well.  Pain is not controlled.  Currently on pain medication.  Weightbearing as tolerated with Cam boot  Review of Systems: Negative except as noted in the HPI. Denies N/V/F/Ch.  Past Medical History:  Diagnosis Date   AKI (acute kidney injury) (HCC) 06/04/2016   Anxiety    Bacteremia 06/04/2016   Choledocholithiasis 03/01/2020   Cholelithiasis 03/02/2020   COPD (chronic obstructive pulmonary disease) (HCC)    Depression    Diarrhea 06/04/2016   GERD (gastroesophageal reflux disease)    History of kidney stones    Hypertension    Hypokalemia 06/04/2016   IBS (irritable bowel syndrome)    Nausea and vomiting 06/04/2016   Positive blood culture 06/04/2016   Rectal fissure 10/04/2015   Surgery, elective 09/22/2019    Current Outpatient Medications:    ADVAIR DISKUS 250-50 MCG/ACT AEPB, Inhale 1 puff into the lungs 2 (two) times daily., Disp: , Rfl:    albuterol (VENTOLIN HFA) 108 (90 Base) MCG/ACT inhaler, Inhale 2 puffs into the lungs every 6 (six) hours as needed for wheezing or shortness of breath., Disp: 8 g, Rfl: 2   amLODipine (NORVASC) 5 MG tablet, Take 5 mg by mouth daily., Disp: , Rfl:    ARIPiprazole (ABILIFY) 2 MG tablet, Take 2 mg by mouth daily., Disp: , Rfl:    clonazePAM (KLONOPIN) 1 MG tablet, Take 1 tablet (1 mg total) by mouth 3 (three) times daily., Disp: 90 tablet, Rfl: 2   FLUoxetine (PROZAC) 40 MG capsule, Take 1 capsule (40 mg total) by mouth daily., Disp: 90 capsule, Rfl: 1   fluticasone (FLONASE) 50 MCG/ACT nasal spray, Place 1 spray into both nostrils daily., Disp: ,  Rfl:    ibuprofen (ADVIL) 800 MG tablet, TAKE ONE TABLET (800 MG TOTAL) BY MOUTH EVERY SIX HOURS AS NEEDED., Disp: 60 tablet, Rfl: 1   lansoprazole (PREVACID) 30 MG capsule, Take 30 mg by mouth 2 (two) times daily., Disp: , Rfl:    lisinopril (ZESTRIL) 20 MG tablet, Take 20 mg by mouth daily., Disp: , Rfl:    nystatin cream (MYCOSTATIN), Apply 1 Application topically as needed., Disp: , Rfl:    oxybutynin (DITROPAN) 5 MG tablet, Take 1 tablet (5 mg total) by mouth every 8 (eight) hours as needed for bladder spasms., Disp: 30 tablet, Rfl: 0   oxyCODONE-acetaminophen (PERCOCET) 10-325 MG tablet, Take 1 tablet by mouth every 4 (four) hours as needed for pain., Disp: 30 tablet, Rfl: 0   oxyCODONE-acetaminophen (PERCOCET) 10-325 MG tablet, Take 1 tablet by mouth every 4 (four) hours as needed for pain., Disp: 30 tablet, Rfl: 0   oxyCODONE-acetaminophen (PERCOCET) 10-325 MG tablet, Take 1 tablet by mouth every 4 (four) hours as needed for pain., Disp: 30 tablet, Rfl: 0   oxyCODONE-acetaminophen (PERCOCET) 5-325 MG tablet, Take 1 tablet by mouth every 4 (four) hours as needed for severe pain (pain score 7-10)., Disp: 30 tablet, Rfl: 0   SPIRIVA RESPIMAT 1.25 MCG/ACT AERS, 2 puffs daily, Disp: , Rfl:    SUMAtriptan (IMITREX) 50 MG tablet, Take 1 tablet (50 mg  total) by mouth daily as needed for migraine. May repeat in 2 hours if headache persists or recurs., Disp: 10 tablet, Rfl: 2   tamsulosin (FLOMAX) 0.4 MG CAPS capsule, Take 1 capsule (0.4 mg total) by mouth daily., Disp: 30 capsule, Rfl: 0   oxyCODONE-acetaminophen (PERCOCET) 5-325 MG tablet, Take 1 tablet by mouth every 4 (four) hours as needed for severe pain (pain score 7-10)., Disp: 30 tablet, Rfl: 0  Social History   Tobacco Use  Smoking Status Former   Current packs/day: 0.00   Average packs/day: 2.0 packs/day for 30.0 years (60.0 ttl pk-yrs)   Types: Cigarettes   Start date: 07/15/1992   Quit date: 07/15/2022   Years since quitting: 0.9   Smokeless Tobacco Never    Allergies  Allergen Reactions   Sulfa Antibiotics Hives   Objective:  There were no vitals filed for this visit. Body mass index is 37.6 kg/m. Constitutional Well developed. Well nourished.  Vascular Foot warm and well perfused. Capillary refill normal to all digits.   Neurologic Normal speech. Oriented to person, place, and time. Epicritic sensation to light touch grossly present bilaterally.  Dermatologic Skin completely reepithelialized.  No signs of dehiscence noted no complication noted.  No open wounds or lesion noted.  No pain with range of motion of the second metatarsophalangeal joint noted  Orthopedic: No tenderness to palpation noted about the surgical site.   Radiographs: 3 views of skeletally mature left foot: Good correction of deformity noted broken pin noted.  Well within the second metatarsal.  Will continue to clinically monitor Assessment:   No diagnosis found.  Plan:  Patient was evaluated and treated and all questions answered.  S/p foot surgery left -Clinically healed and doing well.  She has a broken K wire that appears to be well corticated in the bone for now we will plan on leaving it if there is any issues in the future patient will benefit from removal of the pin.  Will discuss this in the future if it continues to bother her. -She is officially discharged from my care.  She denies any other acute issues No follow-ups on file.

## 2023-07-07 ENCOUNTER — Telehealth: Payer: Self-pay | Admitting: Podiatry

## 2023-07-07 NOTE — Telephone Encounter (Signed)
Patient called asking for a refill of her pain medication oxycodone.

## 2023-07-10 ENCOUNTER — Other Ambulatory Visit: Payer: Self-pay | Admitting: Podiatry

## 2023-07-16 DIAGNOSIS — S83519A Sprain of anterior cruciate ligament of unspecified knee, initial encounter: Secondary | ICD-10-CM

## 2023-07-16 HISTORY — DX: Sprain of anterior cruciate ligament of unspecified knee, initial encounter: S83.519A

## 2023-07-17 ENCOUNTER — Telehealth: Payer: Self-pay

## 2023-07-17 MED ORDER — OXYCODONE-ACETAMINOPHEN 5-325 MG PO TABS: 1.0000 | ORAL_TABLET | ORAL | 0 refills | Status: DC | PRN

## 2023-07-17 NOTE — Telephone Encounter (Signed)
 Patel patient - called, asking for refill on oxycodone 10mg 

## 2023-07-17 NOTE — Addendum Note (Signed)
 Addended byLilian Kapur, Mylisa Brunson R on: 07/17/2023 04:49 PM   Modules accepted: Orders

## 2023-07-23 ENCOUNTER — Other Ambulatory Visit: Payer: Self-pay | Admitting: Podiatry

## 2023-07-24 ENCOUNTER — Telehealth: Payer: Self-pay | Admitting: Podiatry

## 2023-07-24 NOTE — Telephone Encounter (Signed)
Patient called requesting medication

## 2023-07-25 NOTE — Telephone Encounter (Signed)
 Pt called to see if Dr Allena Katz was going to refill her pain medication and the note stated next week. I notified pt of what Dr Allena Katz said.

## 2023-07-31 ENCOUNTER — Telehealth: Payer: Self-pay

## 2023-07-31 ENCOUNTER — Other Ambulatory Visit: Payer: Self-pay | Admitting: Podiatry

## 2023-07-31 NOTE — Telephone Encounter (Signed)
Patient called asking for refill of oxy 10 mg

## 2023-08-04 ENCOUNTER — Telehealth: Payer: Self-pay | Admitting: Podiatry

## 2023-08-04 NOTE — Telephone Encounter (Signed)
Patient needs refill of oxycodone.

## 2023-08-05 ENCOUNTER — Other Ambulatory Visit: Payer: Self-pay | Admitting: Podiatry

## 2023-08-05 MED ORDER — OXYCODONE-ACETAMINOPHEN 5-325 MG PO TABS
1.0000 | ORAL_TABLET | ORAL | 0 refills | Status: DC | PRN
Start: 2023-08-05 — End: 2023-08-11

## 2023-08-08 ENCOUNTER — Ambulatory Visit: Payer: MEDICAID | Admitting: Family Medicine

## 2023-08-11 ENCOUNTER — Ambulatory Visit (INDEPENDENT_AMBULATORY_CARE_PROVIDER_SITE_OTHER): Payer: MEDICAID | Admitting: Family Medicine

## 2023-08-11 ENCOUNTER — Encounter: Payer: Self-pay | Admitting: Family Medicine

## 2023-08-11 VITALS — BP 133/88 | HR 60 | Temp 97.7°F | Resp 16 | Ht 62.0 in | Wt 196.2 lb

## 2023-08-11 DIAGNOSIS — Z131 Encounter for screening for diabetes mellitus: Secondary | ICD-10-CM

## 2023-08-11 DIAGNOSIS — G8929 Other chronic pain: Secondary | ICD-10-CM

## 2023-08-11 DIAGNOSIS — Z23 Encounter for immunization: Secondary | ICD-10-CM

## 2023-08-11 DIAGNOSIS — F411 Generalized anxiety disorder: Secondary | ICD-10-CM | POA: Diagnosis not present

## 2023-08-11 DIAGNOSIS — R7301 Impaired fasting glucose: Secondary | ICD-10-CM | POA: Diagnosis not present

## 2023-08-11 DIAGNOSIS — F419 Anxiety disorder, unspecified: Secondary | ICD-10-CM | POA: Insufficient documentation

## 2023-08-11 DIAGNOSIS — M545 Low back pain, unspecified: Secondary | ICD-10-CM

## 2023-08-11 DIAGNOSIS — G43E01 Chronic migraine with aura, not intractable, with status migrainosus: Secondary | ICD-10-CM

## 2023-08-11 DIAGNOSIS — Z1211 Encounter for screening for malignant neoplasm of colon: Secondary | ICD-10-CM

## 2023-08-11 DIAGNOSIS — J410 Simple chronic bronchitis: Secondary | ICD-10-CM

## 2023-08-11 DIAGNOSIS — D508 Other iron deficiency anemias: Secondary | ICD-10-CM

## 2023-08-11 MED ORDER — SUMATRIPTAN SUCCINATE 50 MG PO TABS: 50.0000 mg | ORAL_TABLET | ORAL | 3 refills | Status: DC | PRN

## 2023-08-11 MED ORDER — CLONAZEPAM 1 MG PO TABS: 1.0000 mg | ORAL_TABLET | Freq: Two times a day (BID) | ORAL | 1 refills | Status: DC | PRN

## 2023-08-11 NOTE — Assessment & Plan Note (Signed)
History of iron deficiency.  S/P hysterectomy.  May be her diet.

## 2023-08-11 NOTE — Assessment & Plan Note (Signed)
Has a migraine today.  Out of Imitrex.  Has seen Neurology in the past.  Would be willing to go back.

## 2023-08-11 NOTE — Progress Notes (Signed)
Established Patient Office Visit  Subjective   Patient ID: Norma [name], female    DOB: [birth year]  Age: 48 y.o. MRN: [id number]  Chief Complaint  Patient presents with   Establish Care   Medical Management of Chronic Issues    HPI Norma [name] reports that her feet no longer hurt.  She has recovered from her surgery. PHQ-9 17 GAD-7 19.  She has been out of Prozac 40 mg for a while and she is out of Klonopin.  She has seen Dr. Tillie Fantasia who has refilled her clonazepam TID for the last three months.  She takes 1 Klonopin TID a day she reports she is not drinking any alcohol.   She's got a migraine headache today and is out of Imitrex.  She has not seen a neurologist in several months.  Her migraine started yesterday she has not take anything for it.  She is sensitive to light and sound.  She denies nausea.   She has a history of iron deficiency anemia, s/p hysterectomy. She is complaining that her back hurts.  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  The pain is localized to her lower back.     ROS    Objective:     BP 133/88 (BP Location: Right Arm, Patient Position: Sitting, Cuff Size: Normal)   Pulse 60   Temp 97.7 F (36.5 C) (Oral)   Resp 16   Ht 5\' 2"  (1.575 m)   Wt 196 lb 3.2 oz (89 kg)   LMP 09/24/2015   SpO2 97%   BMI 35.89 kg/m    Physical Exam Vitals and nursing note reviewed.  Constitutional:      Appearance: Normal appearance.  HENT:     Head: Normocephalic and atraumatic.  Eyes:     Conjunctiva/sclera: Conjunctivae normal.  Cardiovascular:     Rate and Rhythm: Normal rate and regular rhythm.  Pulmonary:     Effort: Pulmonary effort is normal.     Breath sounds: Normal breath sounds.  Musculoskeletal:     Right lower leg: No edema.     Left lower leg: No edema.  Skin:    General: Skin is warm and dry.  Neurological:     Mental Status: She is alert and oriented to person, place, and time.   Psychiatric:        Mood and Affect: Mood normal.        Behavior: Behavior normal.        Thought Content: Thought content normal.        Judgment: Judgment normal.          No results found for any visits on 08/11/23.    The ASCVD Risk score (Arnett DK, et al., 2019) failed to calculate for the following reasons:   Cannot find a previous HDL lab   Cannot find a previous total cholesterol lab    Assessment & Plan:  Screening for colon cancer -     Ambulatory referral to Gastroenterology  Immunization due -     Tdap vaccine greater than or equal to 7yo IM  Need for immunization against influenza -     Flu vaccine trivalent PF, 6mos and older(Flulaval,Afluria,Fluarix,Fluzone)  Screening for diabetes mellitus -     Hemoglobin A1c  Anxiety -     Ambulatory referral to Psychiatry  Elevated fasting glucose -     Lipid panel -     CMP14+EGFR  Chronic  low back pain (1ry area of Pain) (Bilateral) (L>R) w/o sciatica Assessment & Plan: Reports that her back hurts all of the time.  She has been to [facility] but not a pain clinic.  She has never had ESI.  Has not done PT for her back either.     Simple chronic bronchitis (HCC) Assessment & Plan: Has COPD.  Refilled Stiolto and albuterol.  Please stop smoking.     Generalized anxiety disorder Assessment & Plan: PHQ9 was 17 and GAD 7 was 19.  Request refill of clonazepam and Prozac.  Discussed referral to psychiatrist.  Denies HI and SI.    Iron deficiency anemia secondary to inadequate dietary iron intake Assessment & Plan: History of iron deficiency.  S/P hysterectomy.  May be her diet.    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  Out of Imitrex.  Has seen Neurology in the past.  Would be willing to go back.     Other orders -     SUMAtriptan Succinate; Take 1 tablet (50 mg total) by mouth  every 2 (two) hours as needed for migraine. May repeat in 2 hours if headache persists or recurs.  Dispense: 10 tablet; Refill: 3 -     clonazePAM; Take 1 tablet (1 mg total) by mouth 2 (two) times daily as needed for anxiety.  Dispense: 60 tablet; Refill: 1     Return in 4 weeks (on 09/08/2023) for schedule for CPE.    [name], MD

## 2023-08-11 NOTE — Assessment & Plan Note (Signed)
Has COPD.  Refilled Stiolto and albuterol.  Please stop smoking.

## 2023-08-11 NOTE — Assessment & Plan Note (Signed)
Reports that her back hurts all of the time.  She has been to [facility] but not a pain clinic.  She has never had ESI.  Has not done PT for her back either.

## 2023-08-11 NOTE — Assessment & Plan Note (Signed)
PHQ9 was 17 and GAD 7 was 19.  Request refill of clonazepam and Prozac.  Discussed referral to psychiatrist.  Denies HI and SI.

## 2023-08-12 ENCOUNTER — Encounter: Payer: Self-pay | Admitting: Family Medicine

## 2023-08-12 LAB — LIPID PANEL
Chol/HDL Ratio: 2.4 {ratio} (ref 0.0–4.4)
Cholesterol, Total: 169 mg/dL (ref 100–199)
HDL: 69 mg/dL (ref >39)
LDL Chol Calc (NIH): 91 mg/dL (ref 0–99)
Triglycerides: 39 mg/dL (ref 0–149)
VLDL Cholesterol Cal: 9 mg/dL (ref 5–40)

## 2023-08-12 LAB — CMP14+EGFR
ALT: 9 [IU]/L (ref 0–32)
AST: 11 [IU]/L (ref 0–40)
Albumin: 4 g/dL (ref 3.9–4.9)
Alkaline Phosphatase: 96 [IU]/L (ref 44–121)
BUN/Creatinine Ratio: 21 (ref 9–23)
BUN: 15 mg/dL (ref 6–24)
Bilirubin Total: 0.2 mg/dL (ref 0.0–1.2)
CO2: 22 mmol/L (ref 20–29)
Calcium: 9.1 mg/dL (ref 8.7–10.2)
Chloride: 108 mmol/L — ABNORMAL HIGH (ref 96–106)
Creatinine, Ser: 0.71 mg/dL (ref 0.57–1.00)
Globulin, Total: 2 g/dL (ref 1.5–4.5)
Glucose: 81 mg/dL (ref 70–99)
Potassium: 4.5 mmol/L (ref 3.5–5.2)
Sodium: 145 mmol/L — ABNORMAL HIGH (ref 134–144)
Total Protein: 6 g/dL (ref 6.0–8.5)
eGFR: 106 mL/min/{1.73_m2} (ref >59)

## 2023-08-12 LAB — IRON,TIBC AND FERRITIN PANEL
Ferritin: 19 ng/mL (ref 15–150)
Iron Saturation: 14 % — ABNORMAL LOW (ref 15–55)
Iron: 49 ug/dL (ref 27–159)
Total Iron Binding Capacity: 348 ug/dL (ref 250–450)
UIBC: 299 ug/dL (ref 131–425)

## 2023-08-12 LAB — HEMOGLOBIN A1C
Est. average glucose Bld gHb Est-mCnc: 91 mg/dL
Hgb A1c MFr Bld: 4.8 % (ref 4.8–5.6)

## 2023-08-13 ENCOUNTER — Telehealth: Payer: Self-pay | Admitting: Gastroenterology

## 2023-08-13 NOTE — Telephone Encounter (Signed)
The patient said that she someone called her I inform her that no one from our office has called.

## 2023-08-25 ENCOUNTER — Telehealth: Payer: Self-pay | Admitting: Podiatry

## 2023-08-25 NOTE — Telephone Encounter (Signed)
 Patient called in requesting a refill of pain medication. Thank you.

## 2023-08-26 ENCOUNTER — Encounter: Payer: Self-pay | Admitting: *Deleted

## 2023-08-29 ENCOUNTER — Other Ambulatory Visit: Payer: Self-pay | Admitting: Podiatry

## 2023-09-08 ENCOUNTER — Ambulatory Visit: Payer: MEDICAID | Admitting: Family Medicine

## 2023-09-11 ENCOUNTER — Telehealth: Payer: Self-pay

## 2023-09-11 ENCOUNTER — Ambulatory Visit: Payer: MEDICAID | Admitting: Family Medicine

## 2023-09-11 ENCOUNTER — Encounter: Payer: Self-pay | Admitting: Family Medicine

## 2023-09-11 ENCOUNTER — Ambulatory Visit (INDEPENDENT_AMBULATORY_CARE_PROVIDER_SITE_OTHER): Payer: MEDICAID | Admitting: Family Medicine

## 2023-09-11 VITALS — BP 153/91 | HR 67 | Temp 97.7°F | Resp 16 | Ht 62.0 in | Wt 190.4 lb

## 2023-09-11 DIAGNOSIS — G43701 Chronic migraine without aura, not intractable, with status migrainosus: Secondary | ICD-10-CM

## 2023-09-11 DIAGNOSIS — J410 Simple chronic bronchitis: Secondary | ICD-10-CM

## 2023-09-11 MED ORDER — KETOROLAC TROMETHAMINE 30 MG/ML IJ SOLN
30.0000 mg | Freq: Once | INTRAMUSCULAR | Status: AC
  Administered 2023-09-11: 30 mg via INTRAMUSCULAR

## 2023-09-11 MED ORDER — PROMETHAZINE HCL 25 MG PO TABS: 25.0000 mg | ORAL_TABLET | Freq: Four times a day (QID) | ORAL | 0 refills | Status: DC | PRN

## 2023-09-11 MED ORDER — KETOROLAC TROMETHAMINE 30 MG/ML IJ SOLN: 30.0000 mg | Freq: Once | INTRAMUSCULAR | Status: DC

## 2023-09-11 MED ORDER — KETOROLAC TROMETHAMINE 30 MG/ML IJ SOLN
30.0000 mg | Freq: Once | INTRAMUSCULAR | 0 refills | Status: DC
Start: 2023-09-11 — End: 2023-09-11

## 2023-09-11 MED ORDER — ALBUTEROL SULFATE HFA 108 (90 BASE) MCG/ACT IN AERS: 2.0000 | INHALATION_SPRAY | Freq: Four times a day (QID) | RESPIRATORY_TRACT | 2 refills | Status: DC | PRN

## 2023-09-11 MED ORDER — NURTEC 75 MG PO TBDP: 1.0000 | ORAL_TABLET | ORAL | 11 refills | Status: DC

## 2023-09-11 NOTE — Telephone Encounter (Signed)
 Attempted to initiate PA for Nurtec through Cover My Meds, was flagged that it was already in progress. Will follow up for determination.

## 2023-09-11 NOTE — Progress Notes (Signed)
   Established Patient Office Visit  Subjective   Patient ID: Norma [name], female    DOB: 1975/11/06  Age: 48 y.o. MRN: [id number]  Chief Complaint  Patient presents with   Headache    X5 DAYS    Headache   48 yo woman with migraine HA for the last 4 days.  She has got nausea and has not been eating very well.  She does not have any nausea medication.  Is very light sensitive and sound sensitive.  Does not have auras.  Has tried over-the-counter medications and Imitrex without relief.  Is requesting a Toradol injection. Has not seen urology in a while.  Was doing very well with her migraines when she was on [place] but her insurance stopped paying for this.      Review of Systems  Neurological:  Positive for headaches.      Objective:     BP (!) 153/91   Pulse 67   Temp 97.7 F (36.5 C) (Oral)   Resp 16   Ht 5\' 2"  (1.575 m) Comment: per patient  Wt 190 lb 6.4 oz (86.4 kg)   LMP 09/24/2015   SpO2 93%   BMI 34.82 kg/m    Physical Exam Vitals and nursing note reviewed.  Constitutional:      Appearance: Normal appearance.  HENT:     Head: Normocephalic and atraumatic.  Eyes:     Conjunctiva/sclera: Conjunctivae normal.  Cardiovascular:     Rate and Rhythm: Normal rate and regular rhythm.  Pulmonary:     Effort: Pulmonary effort is normal.     Breath sounds: Wheezing present.  Musculoskeletal:     Right lower leg: No edema.     Left lower leg: No edema.  Skin:    General: Skin is warm and dry.  Neurological:     Mental Status: She is alert and oriented to person, place, and time.  Psychiatric:        Mood and Affect: Mood normal.        Behavior: Behavior normal.        Thought Content: Thought content normal.        Judgment: Judgment normal.          No results found for any visits on 09/11/23.    The 10-year ASCVD risk score (Arnett DK, et al., 2019) is: 2%    Assessment & Plan:  Chronic migraine without aura with status migrainosus,  not intractable Assessment & Plan: She has failed Imitrex, maybe she will qualify for Nurtec.  Will send to neurology if we are unsuccessful in getting Nurtec through her insurance  Orders: -     Nurtec; Take 1 tablet (75 mg total) by mouth every other day.  Dispense: 15 tablet; Refill: 11 -     Promethazine HCl; Take 1 tablet (25 mg total) by mouth every 6 (six) hours as needed for nausea or vomiting.  Dispense: 30 tablet; Refill: 0 -     Ketorolac Tromethamine     Return if symptoms worsen or fail to improve.    [name], MD

## 2023-09-11 NOTE — Assessment & Plan Note (Signed)
 She has failed Imitrex, maybe she will qualify for Nurtec.  Will send to neurology if we are unsuccessful in getting Nurtec through her insurance

## 2023-09-11 NOTE — Assessment & Plan Note (Addendum)
 She has good air movement  but She is wheezing today and reports that she is out of albuterol.

## 2023-10-11 ENCOUNTER — Other Ambulatory Visit: Payer: Self-pay | Admitting: Family Medicine

## 2023-10-13 ENCOUNTER — Other Ambulatory Visit: Payer: Self-pay | Admitting: Family Medicine

## 2023-10-13 NOTE — Telephone Encounter (Signed)
 Copied from CRM [id number]. Topic: Clinical - Medication Refill >> Oct 13, 2023  1:23 PM Nyra Capes wrote: Patient called in requesting refill   Most Recent Primary Care Visit:  Provider: [name]  Department: [facility]  Visit Type: ACUTE  Date: 09/11/2023  Medication: clonazePAM (KLONOPIN) 1 MG tablet   Has the patient contacted their pharmacy? No (Agent: If no, request that the patient contact the pharmacy for the refill. If patient does not wish to contact the pharmacy document the reason why and proceed with request.) (Agent: If yes, when and what did the pharmacy advise?)  Is this the correct pharmacy for this prescription? Yes If no, delete pharmacy and type the correct one.  This is the patient's preferred pharmacy:  [facility] - [place], [place] - [address] [place] [address] Phone: [phone] Fax: [phone]   Has the prescription been filled recently? Yes  Is the patient out of the medication? Yes  Has the patient been seen for an appointment in the last year OR does the patient have an upcoming appointment? Yes  Can we respond through MyChart? Yes  Agent: Please be advised that Rx refills may take up to 3 business days. We ask that you follow-up with your pharmacy.

## 2023-10-13 NOTE — Telephone Encounter (Signed)
 Last filled 09/16/2023.  Too early to refill.  Can be filled on 10/15/2023.

## 2023-10-14 ENCOUNTER — Other Ambulatory Visit: Payer: Self-pay | Admitting: Family Medicine

## 2023-10-14 NOTE — Telephone Encounter (Unsigned)
 Copied from CRM [id number]. Topic: Clinical - Medication Refill >> Oct 14, 2023  1:22 PM Ivette P wrote: Most Recent Primary Care Visit:  Provider: [name]  Department: [facility]  Visit Type: ACUTE  Date: 09/11/2023  Medication: clonazePAM (KLONOPIN) 1 MG tablet  Has the patient contacted their pharmacy? Yes (Agent: If no, request that the patient contact the pharmacy for the refill. If patient does not wish to contact the pharmacy document the reason why and proceed with request.) (Agent: If yes, when and what did the pharmacy advise?)  Is this the correct pharmacy for this prescription? Yes If no, delete pharmacy and type the correct one.  This is the patient's preferred pharmacy:  [facility] - [place], [place] - [address] [place] [address] Phone: [phone] Fax: [phone]   Has the prescription been filled recently? Yes, 09/16/2023  Is the patient out of the medication? No, pt states she has none left   Has the patient been seen for an appointment in the last year OR does the patient have an upcoming appointment? Yes  Can we respond through MyChart? Yes  Agent: Please be advised that Rx refills may take up to 3 business days. We ask that you follow-up with your pharmacy.

## 2023-10-14 NOTE — Telephone Encounter (Signed)
 Cannot get this filled until 10/16/2023

## 2023-10-14 NOTE — Telephone Encounter (Signed)
 Pt states she was advised this could be filled now and she could pay out of pocket, this RN advised that because it is a controlled substance there are additional guidelines for refilling those scripts. Pt requests clarification on whether this can be sent early. Routing to clinic for review.

## 2023-10-14 NOTE — Telephone Encounter (Signed)
 Copied from CRM [id number]. Topic: Clinical - Medication Refill >> Oct 14, 2023  8:24 AM Vista Lawman wrote: Most Recent Primary Care Visit:  Provider: [name]  Department: [facility]  Visit Type: ACUTE  Date: 09/11/2023  Medication: clonazePAM (KLONOPIN) 1 MG tablet  Has the patient contacted their pharmacy? Yes (Agent: If no, request that the patient contact the pharmacy for the refill. If patient does not wish to contact the pharmacy document the reason why and proceed with request.) (Agent: If yes, when and what did the pharmacy advise?)  Is this the correct pharmacy for this prescription? Yes If no, delete pharmacy and type the correct one.  This is the patient's preferred pharmacy:  [facility] - [place], [place] - [address] [place] [address] Phone: [phone] Fax: [phone]   Has the prescription been filled recently? Yes  Is the patient out of the medication? Yes  Has the patient been seen for an appointment in the last year OR does the patient have an upcoming appointment? Yes  Can we respond through MyChart? Yes  Agent: Please be advised that Rx refills may take up to 3 business days. We ask that you follow-up with your pharmacy.

## 2023-10-14 NOTE — Telephone Encounter (Signed)
 See provider notes, too early for fill.  LVM advising patient.

## 2023-10-14 NOTE — Telephone Encounter (Signed)
 Copied from CRM [id number]. Topic: Clinical - Medication Refill >> Oct 14, 2023  1:22 PM Ivette P wrote: Most Recent Primary Care Visit:  Provider: [name]  Department: [facility]  Visit Type: ACUTE  Date: 09/11/2023  Medication: clonazePAM (KLONOPIN) 1 MG tablet  Has the patient contacted their pharmacy? Yes (Agent: If no, request that the patient contact the pharmacy for the refill. If patient does not wish to contact the pharmacy document the reason why and proceed with request.) (Agent: If yes, when and what did the pharmacy advise?)  Is this the correct pharmacy for this prescription? Yes If no, delete pharmacy and type the correct one.  This is the patient's preferred pharmacy:  [facility] - [place], [place] - [address] [place] [address] Phone: [phone] Fax: [phone]   Has the prescription been filled recently? Yes, 09/16/2023  Is the patient out of the medication? No, pt states she has none left   Has the patient been seen for an appointment in the last year OR does the patient have an upcoming appointment? Yes  Can we respond through MyChart? Yes  Agent: Please be advised that Rx refills may take up to 3 business days. We ask that you follow-up with your pharmacy.

## 2023-10-15 NOTE — Telephone Encounter (Signed)
 Per Dr. Girtha Rm, patient's request is denied.

## 2023-10-16 ENCOUNTER — Other Ambulatory Visit: Payer: Self-pay | Admitting: Family Medicine

## 2023-10-16 DIAGNOSIS — F419 Anxiety disorder, unspecified: Secondary | ICD-10-CM

## 2023-10-16 MED ORDER — CLONAZEPAM 1 MG PO TABS
1.0000 mg | ORAL_TABLET | Freq: Two times a day (BID) | ORAL | 1 refills | Status: DC | PRN
Start: 2023-10-16 — End: 2024-02-17

## 2023-10-16 NOTE — Telephone Encounter (Signed)
 Morrie Sheldon called to report that the patient just filled her Rx at their pharmacy 09/16/2023. Morrie Sheldon says the patient is out of her supply and is seeking a refill. Please advise.

## 2023-10-16 NOTE — Telephone Encounter (Signed)
 Morrie Sheldon from [facility] pharmacy wants a call back from the clinic to verify the hold instructions, they want to discuss what to expect moving forward. Ask for [place].

## 2023-10-17 NOTE — Telephone Encounter (Signed)
 LVM requesting a return call if she has not received medication refill.

## 2023-10-29 ENCOUNTER — Telehealth: Payer: Self-pay | Admitting: Podiatry

## 2023-10-29 ENCOUNTER — Ambulatory Visit: Payer: Self-pay | Admitting: Urology

## 2023-10-29 ENCOUNTER — Other Ambulatory Visit: Payer: Self-pay | Admitting: Podiatry

## 2023-10-29 MED ORDER — OXYCODONE-ACETAMINOPHEN 5-325 MG PO TABS: 1.0000 | ORAL_TABLET | ORAL | 0 refills | Status: DC | PRN

## 2023-10-29 NOTE — Telephone Encounter (Signed)
 Pt called in today requesting  a refill of oxycodone.

## 2023-10-29 NOTE — Telephone Encounter (Signed)
 Patient is requesting pain medication for left foot toe next to great toe is in pain. [employer]. Patient contact telephone, [phone]

## 2023-11-04 ENCOUNTER — Ambulatory Visit: Payer: MEDICAID | Admitting: Podiatry

## 2023-11-05 ENCOUNTER — Telehealth: Payer: Self-pay | Admitting: Podiatry

## 2023-11-05 NOTE — Telephone Encounter (Signed)
 Patient is requesting refill for oxycodone , Patient contact telephone number, [phone]

## 2023-11-06 ENCOUNTER — Other Ambulatory Visit: Payer: Self-pay

## 2023-11-06 ENCOUNTER — Encounter: Payer: Self-pay | Admitting: Urology

## 2023-11-06 DIAGNOSIS — N200 Calculus of kidney: Secondary | ICD-10-CM

## 2023-11-07 ENCOUNTER — Telehealth: Payer: Self-pay | Admitting: Podiatry

## 2023-11-07 NOTE — Telephone Encounter (Signed)
 Patient is calling again requesting refill on oxycodone  10MG / Patient contact telephone [phone]

## 2023-11-10 ENCOUNTER — Telehealth: Payer: Self-pay | Admitting: Podiatry

## 2023-11-10 NOTE — Telephone Encounter (Signed)
 Patient called to reschedule her appt until thurday but she wanted to know if she can get a refuill of her pain meds.

## 2023-11-11 ENCOUNTER — Ambulatory Visit: Payer: MEDICAID | Admitting: Podiatry

## 2023-11-13 ENCOUNTER — Ambulatory Visit: Payer: MEDICAID | Admitting: Podiatry

## 2023-11-13 ENCOUNTER — Ambulatory Visit: Payer: MEDICAID | Admitting: Urology

## 2023-11-13 DIAGNOSIS — N200 Calculus of kidney: Secondary | ICD-10-CM

## 2023-11-14 ENCOUNTER — Ambulatory Visit: Payer: MEDICAID | Admitting: Family Medicine

## 2023-11-17 ENCOUNTER — Encounter: Payer: Self-pay | Admitting: Emergency Medicine

## 2023-11-17 ENCOUNTER — Emergency Department
Admission: EM | Admit: 2023-11-17 | Discharge: 2023-11-17 | Disposition: A | Discharge status: Home / Self Care | Payer: MEDICAID | Attending: Emergency Medicine | Admitting: Emergency Medicine

## 2023-11-17 ENCOUNTER — Other Ambulatory Visit: Payer: Self-pay

## 2023-11-17 DIAGNOSIS — R519 Headache, unspecified: Secondary | ICD-10-CM | POA: Diagnosis present

## 2023-11-17 DIAGNOSIS — G43809 Other migraine, not intractable, without status migrainosus: Secondary | ICD-10-CM | POA: Insufficient documentation

## 2023-11-17 DIAGNOSIS — I10 Essential (primary) hypertension: Secondary | ICD-10-CM | POA: Diagnosis not present

## 2023-11-17 LAB — COMPREHENSIVE METABOLIC PANEL WITH GFR
ALT: 12 U/L (ref 0–44)
AST: 13 U/L — ABNORMAL LOW (ref 15–41)
Albumin: 3.8 g/dL (ref 3.5–5.0)
Alkaline Phosphatase: 60 U/L (ref 38–126)
Anion gap: 9 (ref 5–15)
BUN: 26 mg/dL — ABNORMAL HIGH (ref 6–20)
CO2: 20 mmol/L — ABNORMAL LOW (ref 22–32)
Calcium: 8.4 mg/dL — ABNORMAL LOW (ref 8.9–10.3)
Chloride: 115 mmol/L — ABNORMAL HIGH (ref 98–111)
Creatinine, Ser: 0.93 mg/dL (ref 0.44–1.00)
GFR, Estimated: >60 mL/min (ref >60)
Glucose, Bld: 96 mg/dL (ref 70–99)
Potassium: 3.7 mmol/L (ref 3.5–5.1)
Sodium: 144 mmol/L (ref 135–145)
Total Bilirubin: 0.4 mg/dL (ref 0.0–1.2)
Total Protein: 6.8 g/dL (ref 6.5–8.1)

## 2023-11-17 LAB — CBC
HCT: 38.1 % (ref 36.0–46.0)
Hemoglobin: 12.1 g/dL (ref 12.0–15.0)
MCH: 28.6 pg (ref 26.0–34.0)
MCHC: 31.8 g/dL (ref 30.0–36.0)
MCV: 90.1 fL (ref 80.0–100.0)
Platelets: 287 10*3/uL (ref 150–400)
RBC: 4.23 MIL/uL (ref 3.87–5.11)
RDW: 15.7 % — ABNORMAL HIGH (ref 11.5–15.5)
WBC: 4.4 10*3/uL (ref 4.0–10.5)
nRBC: 0 % (ref 0.0–0.2)

## 2023-11-17 LAB — CBG MONITORING, ED: Glucose-Capillary: 108 mg/dL — ABNORMAL HIGH (ref 70–99)

## 2023-11-17 MED ORDER — DROPERIDOL 2.5 MG/ML IJ SOLN
2.5000 mg | Freq: Once | INTRAMUSCULAR | Status: AC
  Administered 2023-11-17: 2.5 mg via INTRAVENOUS
  Filled 2023-11-17: qty 2

## 2023-11-17 MED ORDER — SODIUM CHLORIDE 0.9 % IV BOLUS
500.0000 mL | Freq: Once | INTRAVENOUS | Status: AC
  Administered 2023-11-17: 500 mL via INTRAVENOUS

## 2023-11-17 MED ORDER — PROCHLORPERAZINE EDISYLATE 10 MG/2ML IJ SOLN
10.0000 mg | Freq: Once | INTRAMUSCULAR | Status: AC
Start: 2023-11-17 — End: 2023-11-17
  Administered 2023-11-17: 10 mg via INTRAVENOUS
  Filled 2023-11-17: qty 2

## 2023-11-17 MED ORDER — KETOROLAC TROMETHAMINE 15 MG/ML IJ SOLN
15.0000 mg | Freq: Once | INTRAMUSCULAR | Status: AC
  Administered 2023-11-17: 15 mg via INTRAVENOUS
  Filled 2023-11-17: qty 1

## 2023-11-17 NOTE — ED Triage Notes (Addendum)
 Pt arrived via POV with reports migraine x 1 week, pt also states she has what she describes as hallucinations since yesterday, pt states she has had ringing in her ears and also reports she feels like she can hear a radio.  Pt reports dizziness for several days, light sensitivity, nausea, vomiting and upset stomach. Pt reports hx of migraines.  Pt reports taking tylenol  for her headache.
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
## 2023-11-17 NOTE — ED Notes (Signed)
 See triage note  Presents with headache  States developed headache about 1 week ago    States she has tried OTC meds w/o relief Denies any n/v or fever

## 2023-11-18 ENCOUNTER — Encounter: Payer: Self-pay | Admitting: *Deleted

## 2023-11-18 ENCOUNTER — Emergency Department: Payer: MEDICAID

## 2023-11-18 ENCOUNTER — Ambulatory Visit (INDEPENDENT_AMBULATORY_CARE_PROVIDER_SITE_OTHER): Payer: MEDICAID | Admitting: Family Medicine

## 2023-11-18 ENCOUNTER — Other Ambulatory Visit: Payer: Self-pay

## 2023-11-18 ENCOUNTER — Emergency Department
Admission: EM | Admit: 2023-11-18 | Discharge: 2023-11-20 | Disposition: A | Discharge status: Other Acute Inpatient Hospital | Payer: MEDICAID | Attending: Emergency Medicine | Admitting: Emergency Medicine

## 2023-11-18 ENCOUNTER — Encounter: Payer: Self-pay | Admitting: Family Medicine

## 2023-11-18 VITALS — BP 140/87 | HR 71 | Temp 97.8°F | Resp 18 | Ht 62.0 in | Wt 202.0 lb

## 2023-11-18 DIAGNOSIS — F332 Major depressive disorder, recurrent severe without psychotic features: Secondary | ICD-10-CM | POA: Insufficient documentation

## 2023-11-18 DIAGNOSIS — I10 Essential (primary) hypertension: Secondary | ICD-10-CM | POA: Diagnosis not present

## 2023-11-18 DIAGNOSIS — Z7951 Long term (current) use of inhaled steroids: Secondary | ICD-10-CM | POA: Diagnosis not present

## 2023-11-18 DIAGNOSIS — F411 Generalized anxiety disorder: Secondary | ICD-10-CM | POA: Diagnosis present

## 2023-11-18 DIAGNOSIS — J449 Chronic obstructive pulmonary disease, unspecified: Secondary | ICD-10-CM | POA: Insufficient documentation

## 2023-11-18 DIAGNOSIS — Z79899 Other long term (current) drug therapy: Secondary | ICD-10-CM | POA: Insufficient documentation

## 2023-11-18 DIAGNOSIS — R45851 Suicidal ideations: Secondary | ICD-10-CM | POA: Diagnosis not present

## 2023-11-18 DIAGNOSIS — F1310 Sedative, hypnotic or anxiolytic abuse, uncomplicated: Secondary | ICD-10-CM | POA: Insufficient documentation

## 2023-11-18 DIAGNOSIS — F410 Panic disorder [episodic paroxysmal anxiety] without agoraphobia: Secondary | ICD-10-CM | POA: Diagnosis not present

## 2023-11-18 DIAGNOSIS — R413 Other amnesia: Secondary | ICD-10-CM | POA: Insufficient documentation

## 2023-11-18 DIAGNOSIS — R443 Hallucinations, unspecified: Secondary | ICD-10-CM

## 2023-11-18 DIAGNOSIS — F29 Unspecified psychosis not due to a substance or known physiological condition: Secondary | ICD-10-CM

## 2023-11-18 DIAGNOSIS — Y900 Blood alcohol level of less than 20 mg/100 ml: Secondary | ICD-10-CM | POA: Insufficient documentation

## 2023-11-18 DIAGNOSIS — F331 Major depressive disorder, recurrent, moderate: Secondary | ICD-10-CM | POA: Diagnosis not present

## 2023-11-18 DIAGNOSIS — F17210 Nicotine dependence, cigarettes, uncomplicated: Secondary | ICD-10-CM | POA: Insufficient documentation

## 2023-11-18 DIAGNOSIS — J410 Simple chronic bronchitis: Secondary | ICD-10-CM

## 2023-11-18 LAB — URINE DRUG SCREEN, QUALITATIVE (ARMC ONLY)
Amphetamines, Ur Screen: NOT DETECTED
Barbiturates, Ur Screen: NOT DETECTED
Benzodiazepine, Ur Scrn: NOT DETECTED
Cannabinoid 50 Ng, Ur ~~LOC~~: UNDETERMINED
Cocaine Metabolite,Ur ~~LOC~~: NOT DETECTED
MDMA (Ecstasy)Ur Screen: NOT DETECTED
Methadone Scn, Ur: NOT DETECTED
Opiate, Ur Screen: NOT DETECTED
Phencyclidine (PCP) Ur S: NOT DETECTED
Tricyclic, Ur Screen: POSITIVE — AB

## 2023-11-18 LAB — CBC
HCT: 38.7 % (ref 36.0–46.0)
Hemoglobin: 12.1 g/dL (ref 12.0–15.0)
MCH: 28.5 pg (ref 26.0–34.0)
MCHC: 31.3 g/dL (ref 30.0–36.0)
MCV: 91.1 fL (ref 80.0–100.0)
Platelets: 309 10*3/uL (ref 150–400)
RBC: 4.25 MIL/uL (ref 3.87–5.11)
RDW: 15.7 % — ABNORMAL HIGH (ref 11.5–15.5)
WBC: 4.8 10*3/uL (ref 4.0–10.5)
nRBC: 0 % (ref 0.0–0.2)

## 2023-11-18 LAB — ETHANOL: Alcohol, Ethyl (B): <15 mg/dL (ref <15)

## 2023-11-18 LAB — COMPREHENSIVE METABOLIC PANEL WITH GFR
ALT: 15 U/L (ref 0–44)
AST: 20 U/L (ref 15–41)
Albumin: 4 g/dL (ref 3.5–5.0)
Alkaline Phosphatase: 64 U/L (ref 38–126)
Anion gap: 9 (ref 5–15)
BUN: 18 mg/dL (ref 6–20)
CO2: 20 mmol/L — ABNORMAL LOW (ref 22–32)
Calcium: 9.2 mg/dL (ref 8.9–10.3)
Chloride: 113 mmol/L — ABNORMAL HIGH (ref 98–111)
Creatinine, Ser: 0.83 mg/dL (ref 0.44–1.00)
GFR, Estimated: >60 mL/min (ref >60)
Glucose, Bld: 94 mg/dL (ref 70–99)
Potassium: 3.6 mmol/L (ref 3.5–5.1)
Sodium: 142 mmol/L (ref 135–145)
Total Bilirubin: 0.5 mg/dL (ref 0.0–1.2)
Total Protein: 6.9 g/dL (ref 6.5–8.1)

## 2023-11-18 MED ORDER — ALBUTEROL SULFATE HFA 108 (90 BASE) MCG/ACT IN AERS: 2.0000 | INHALATION_SPRAY | Freq: Four times a day (QID) | RESPIRATORY_TRACT | 2 refills | Status: DC | PRN

## 2023-11-18 MED ORDER — FLUOXETINE HCL 40 MG PO CAPS: 40.0000 mg | ORAL_CAPSULE | Freq: Every day | ORAL | 1 refills | Status: DC

## 2023-11-18 MED ORDER — ACETAMINOPHEN 325 MG PO TABS
650.0000 mg | ORAL_TABLET | Freq: Once | ORAL | Status: AC
  Administered 2023-11-18: 650 mg via ORAL
  Filled 2023-11-18: qty 2

## 2023-11-18 MED ORDER — CLONAZEPAM 1 MG PO TABS
1.0000 mg | ORAL_TABLET | Freq: Once | ORAL | Status: AC
  Administered 2023-11-18: 1 mg via ORAL
  Filled 2023-11-18: qty 1

## 2023-11-18 NOTE — ED Notes (Signed)
 Pt to ct

## 2023-11-18 NOTE — ED Notes (Signed)
 Pt given snack and beverage.

## 2023-11-18 NOTE — ED Triage Notes (Addendum)
 Pt ambulatory to triage.  Pt brought in by her sister.   Pt carrying a stuffed bear.  Pt states the bear is talking to her   Pt is SI and tearful.  Pt denies drugs and etoh.  Pt is Voluntary  Pt also reports headaches.  Pt alert.

## 2023-11-18 NOTE — Progress Notes (Unsigned)
 Established Patient Office Visit  Subjective   Patient ID: Norma [name], female    DOB: 11/21/1975  Age: 48 y.o. MRN: [id number]  Chief Complaint  Patient presents with   Medical Management of Chronic Issues   Hearing Problem         HPI She tells me she is having a bad day and that she needs a priest.  She is crying during the interview.  She says her dog is possessed and is trying to "feed off of her.  There is a "demon trying to eat my brain ".  She states there is "demon sucking on her head ".  The demon appeared to her this morning.  She wants to go to the ER and see if they will help her. She denies any ETOH or drug use.  Ports she has never had a problem like this before.  She sees a reflection of the demon when she looks around the room.  She is very frustrated because nobody is helping her with this problem.  She reports that her hearing is off and she is hearing a buzzing sound in her ears.  She denies having a headache. She went to the ER yesterday with a migraine headache and got an IV of droperidol and her headache stopped.  The only other medicine she is taking is clonazepam  1 mg twice a day.  She does not have a psychiatrist at this time.   She ask if she can have her clonazepam  increased to 3 a day. She wants to leave to go to [facility] ER so she can get her head scanned.  She does not have a headache    ROS    Objective:     BP (!) 140/87 (BP Location: Left Arm, Patient Position: Sitting, Cuff Size: Normal)   Pulse 71   Temp 97.8 F (36.6 C) (Oral)   Resp 18   Ht 5\' 2"  (1.575 m)   Wt 202 lb (91.6 kg)   LMP 09/24/2015   SpO2 98%   BMI 36.95 kg/m    Physical Exam Vitals reviewed.  Constitutional:      Appearance: Normal appearance.  HENT:     Head: Normocephalic.  Eyes:     General:        Right eye: No discharge.        Left eye: No discharge.  Cardiovascular:     Rate and Rhythm: Normal rate.  Pulmonary:     Effort: Pulmonary effort is normal.   Neurological:     Mental Status: She is alert and oriented to person, place, and time.  Psychiatric:        Mood and Affect: Mood normal.        Behavior: Behavior normal.        Thought Content: Thought content normal.        Judgment: Judgment normal.          Results for orders placed or performed during the hospital encounter of 11/18/23  Comprehensive metabolic panel  Result Value Ref Range   Sodium 142 135 - 145 mmol/L   Potassium 3.6 3.5 - 5.1 mmol/L   Chloride 113 (H) 98 - 111 mmol/L   CO2 20 (L) 22 - 32 mmol/L   Glucose, Bld 94 70 - 99 mg/dL   BUN 18 6 - 20 mg/dL   Creatinine, Ser 3.08 0.44 - 1.00 mg/dL   Calcium  9.2 8.9 - 10.3 mg/dL   Total Protein 6.9 6.5 -  8.1 g/dL   Albumin 4.0 3.5 - 5.0 g/dL   AST 20 15 - 41 U/L   ALT 15 0 - 44 U/L   Alkaline Phosphatase 64 38 - 126 U/L   Total Bilirubin 0.5 0.0 - 1.2 mg/dL   GFR, Estimated >06 >30 mL/min   Anion gap 9 5 - 15  Ethanol  Result Value Ref Range   Alcohol, Ethyl (B) <15 <15 mg/dL  cbc  Result Value Ref Range   WBC 4.8 4.0 - 10.5 K/uL   RBC 4.25 3.87 - 5.11 MIL/uL   Hemoglobin 12.1 12.0 - 15.0 g/dL   HCT 16.0 10.9 - 32.3 %   MCV 91.1 80.0 - 100.0 fL   MCH 28.5 26.0 - 34.0 pg   MCHC 31.3 30.0 - 36.0 g/dL   RDW 55.7 (H) 32.2 - 02.5 %   Platelets 309 150 - 400 K/uL   nRBC 0.0 0.0 - 0.2 %  Urine Drug Screen, Qualitative  Result Value Ref Range   Tricyclic, Ur Screen POSITIVE (A) NONE DETECTED   Amphetamines, Ur Screen NONE DETECTED NONE DETECTED   MDMA (Ecstasy)Ur Screen NONE DETECTED NONE DETECTED   Cocaine Metabolite,Ur [place] NONE DETECTED NONE DETECTED   Opiate, Ur Screen NONE DETECTED NONE DETECTED   Phencyclidine (PCP) Ur S NONE DETECTED NONE DETECTED   Cannabinoid 50 Ng, Ur [place] UNABLE TO REPORT NONE DETECTED   Barbiturates, Ur Screen NONE DETECTED NONE DETECTED   Benzodiazepine, Ur Scrn NONE DETECTED NONE DETECTED   Methadone Scn, Ur NONE DETECTED NONE DETECTED      The 10-year ASCVD risk score  (Arnett DK, et al., 2019) is: 1%    Assessment & Plan:  Psychosis, unspecified psychosis type (HCC) Assessment & Plan: Acute psychosis.  She refuses to let me call ambulance to take her to the hospital.  She left the clinic and stated she was going to the ER.   Simple chronic bronchitis (HCC) -     Albuterol  Sulfate HFA; Inhale 2 puffs into the lungs every 6 (six) hours as needed for wheezing or shortness of breath.  Dispense: 8 g; Refill: 2  Moderate episode of recurrent major depressive disorder (HCC)  Generalized anxiety disorder with panic attacks     Return in about 1 week (around 11/25/2023).    Aveleen Nevers K Hayze Gazda, MD

## 2023-11-18 NOTE — ED Notes (Signed)
 This tech obtained vital signs on pt.

## 2023-11-18 NOTE — BH Assessment (Signed)
 Pt is currently too somnolent to participate in assessment.  TTS will follow up with assessment at later time

## 2023-11-18 NOTE — ED Provider Notes (Signed)
 [facility] Provider Note    Event Date/Time   First MD Initiated Contact with Patient 11/18/23 1644     (approximate)   History   Psychiatric Evaluation   HPI  Norma [name] is a 48 year old female with history of HTN, migraines presenting to the emergency department for evaluation of suicidal ideation and hallucinations.  Patient reports she has been having ongoing headaches for the past several days.  Seen in our ER yesterday with improvement and was discharged.  However, she reports that last night she had onset of seeing visual hallucinations.  Today she reports onset of auditory hallucinations and that if there is a "demon in her teddy bear".  Denies command hallucinations.  She additionally reports suicidal ideation.  Denies any specific plan, but reports she does have access to a gun.  Denies history of AVH in the past.     Physical Exam   Triage Vital Signs: ED Triage Vitals  Encounter Vitals Group     BP 11/18/23 1608 (!) 161/103     Systolic BP Percentile --      Diastolic BP Percentile --      Pulse Rate 11/18/23 1608 95     Resp 11/18/23 1608 20     Temp 11/18/23 1608 98 F (36.7 C)     Temp Source 11/18/23 1608 Oral     SpO2 11/18/23 1608 96 %     Weight 11/18/23 1603 195 lb (88.5 kg)     Height 11/18/23 1603 5\' 2"  (1.575 m)     Head Circumference --      Peak Flow --      Pain Score 11/18/23 1603 0     Pain Loc --      Pain Education --      Exclude from Growth Chart --     Most recent vital signs: Vitals:   11/18/23 1608 11/18/23 1941  BP: (!) 161/103 110/74  Pulse: 95 69  Resp: 20 20  Temp: 98 F (36.7 C) 97.9 F (36.6 C)  SpO2: 96% 95%     General: Awake, interactive  CV:  Regular rate, good peripheral perfusion.  Resp:  Unlabored respirations.  Abd:  Nondistended.  Neuro:  Symmetric facial movement, fluid speech, moving extremities spontaneously and equally   ED Results / Procedures / Treatments    Labs (all labs ordered are listed, but only abnormal results are displayed) Labs Reviewed  COMPREHENSIVE METABOLIC PANEL WITH GFR - Abnormal; Notable for the following components:      Result Value   Chloride 113 (*)    CO2 20 (*)    All other components within normal limits  CBC - Abnormal; Notable for the following components:   RDW 15.7 (*)    All other components within normal limits  URINE DRUG SCREEN, QUALITATIVE ([facility] ONLY) - Abnormal; Notable for the following components:   Tricyclic, Ur Screen POSITIVE (*)    All other components within normal limits  ETHANOL  POC URINE PREG, ED     EKG EKG independently reviewed interpreted by myself (ER attending) demonstrates:    RADIOLOGY Imaging independently reviewed and interpreted by myself demonstrates:  CT head without acute bleed  Formal Radiology Read:  CT Head Wo Contrast Result Date: 11/18/2023 CLINICAL DATA:  Mental status change of unknown cause. EXAM: CT HEAD WITHOUT CONTRAST TECHNIQUE: Contiguous axial images were obtained from the base of the skull through the vertex without intravenous contrast. RADIATION DOSE REDUCTION: This exam  was performed according to the departmental dose-optimization program which includes automated exposure control, adjustment of the mA and/or kV according to patient size and/or use of iterative reconstruction technique. COMPARISON:  01/10/2022 FINDINGS: Brain: The brain shows a normal appearance without evidence of malformation, atrophy, old or acute small or large vessel infarction, mass lesion, hemorrhage, hydrocephalus or extra-axial collection. Vascular: No hyperdense vessel. No evidence of atherosclerotic calcification. Skull: Normal.  No traumatic finding.  No focal bone lesion. Sinuses/Orbits: Sinuses are clear. Orbits appear normal. Mastoids are clear. Other: None significant IMPRESSION: Normal head CT. Electronically Signed   By: Bettylou Brunner M.D.   On: 11/18/2023 18:03     PROCEDURES:  Critical Care performed: No  Procedures   MEDICATIONS ORDERED IN ED: Medications  acetaminophen  (TYLENOL ) tablet 650 mg (650 mg Oral Given 11/18/23 1759)  clonazePAM  (KLONOPIN ) tablet 1 mg (1 mg Oral Given 11/18/23 1759)     IMPRESSION / MDM / ASSESSMENT AND PLAN / ED COURSE  I reviewed the triage vital signs and the nursing notes.  Differential diagnosis includes, but is not limited to, medication adverse effect, drug intoxication or withdrawal, acute intracranial process, anemia, electrolyte abnormality, primary psychiatric disorder  Patient's presentation is most consistent with acute presentation with potential threat to life or bodily function.  48 year old female presenting with suicidal ideation without a plan, new onset auditory and visual hallucinations without command hallucinations.  Is agreeable with psychiatry evaluation.  Will obtain CT head and labs as I do not see prior psychiatry evaluation in our system.  If these are reassuring, suspect patient can be medically cleared for psychiatric evaluation.   The patient has been placed in psychiatric observation due to the need to provide a safe environment for the patient while obtaining psychiatric consultation and evaluation, as well as ongoing medical and medication management to treat the patient's condition.  The patient has not been placed under full IVC at this time.    Clinical Course as of 11/18/23 2114  Tue Nov 18, 2023  1914 Labs reassuring, UDS positive for [facility].  CT head negative, do think patient can be medically cleared for psychiatric evaluation. [NR]    Clinical Course User Index [NR] Claria Crofts, MD     FINAL CLINICAL IMPRESSION(S) / ED DIAGNOSES   Final diagnoses:  Suicidal ideation  Hallucinations     Rx / DC Orders   ED Discharge Orders     None        Note:  This document was prepared using Dragon voice recognition software and may include unintentional dictation  errors.   Claria Crofts, MD 11/18/23 2114

## 2023-11-18 NOTE — ED Notes (Signed)
 All belongings given to sister-angie dixon.

## 2023-11-18 NOTE — ED Notes (Signed)
 Pt's sister stated that she has not slept in >2days, that she has been Rx two psych med.s that she is not compliant for. She also stated that she recently had "pain pills" that she believes she was not taking as prescribed. Pt sister continued to share that Ms Greany has a stuffed animal that she was telling her sister that it was "talking to her and telling her to cut her throat and wrist."  Pt sister was upset and stated she has never seen her sister "act like this". She left her number to be contacted if needed, her name is Angie @ [phone]

## 2023-11-18 NOTE — ED Notes (Signed)
 Pt. Transferred to BHU , room# 7 from main ed .Patient was screened by security before entering the unit. Received Report and Recommendations from [place], RN . Pt. Oriented to unit including Q15 minute rounding as well as locked bathroom protocol, meal / snack schedule, and  the security cameras in place for their protection. Patient is A/O x 4, warm / dry.  Showing no acute signs of distress. Staff to monitor as ordered

## 2023-11-19 DIAGNOSIS — F29 Unspecified psychosis not due to a substance or known physiological condition: Secondary | ICD-10-CM

## 2023-11-19 LAB — URINALYSIS, W/ REFLEX TO CULTURE (INFECTION SUSPECTED)
Bilirubin Urine: NEGATIVE
Glucose, UA: NEGATIVE mg/dL
Hgb urine dipstick: NEGATIVE
Ketones, ur: NEGATIVE mg/dL
Leukocytes,Ua: NEGATIVE
Nitrite: NEGATIVE
Protein, ur: NEGATIVE mg/dL
Specific Gravity, Urine: 1.03 (ref 1.005–1.030)
pH: 5 (ref 5.0–8.0)

## 2023-11-19 MED ORDER — FLUOXETINE HCL 20 MG PO CAPS
40.0000 mg | ORAL_CAPSULE | Freq: Every day | ORAL | Status: DC
Start: 2023-11-19 — End: 2023-11-20
  Administered 2023-11-19 – 2023-11-20 (×2): 40 mg via ORAL
  Filled 2023-11-19 (×2): qty 2

## 2023-11-19 MED ORDER — ARIPIPRAZOLE 2 MG PO TABS
2.0000 mg | ORAL_TABLET | Freq: Every day | ORAL | Status: DC
  Administered 2023-11-20: 2 mg via ORAL
  Filled 2023-11-19 (×2): qty 1

## 2023-11-19 MED ORDER — ALBUTEROL SULFATE HFA 108 (90 BASE) MCG/ACT IN AERS: 2.0000 | INHALATION_SPRAY | Freq: Four times a day (QID) | RESPIRATORY_TRACT | Status: DC | PRN

## 2023-11-19 MED ORDER — PANTOPRAZOLE SODIUM 40 MG PO TBEC
40.0000 mg | DELAYED_RELEASE_TABLET | Freq: Every day | ORAL | Status: DC
  Administered 2023-11-19 – 2023-11-20 (×2): 40 mg via ORAL
  Filled 2023-11-19 (×2): qty 1

## 2023-11-19 MED ORDER — CLONAZEPAM 1 MG PO TABS
1.0000 mg | ORAL_TABLET | Freq: Two times a day (BID) | ORAL | Status: DC
Start: 2023-11-19 — End: 2023-11-20
  Administered 2023-11-19 – 2023-11-20 (×3): 1 mg via ORAL
  Filled 2023-11-19 (×3): qty 1

## 2023-11-19 MED ORDER — LISINOPRIL 20 MG PO TABS
20.0000 mg | ORAL_TABLET | Freq: Every day | ORAL | Status: DC
  Administered 2023-11-19 – 2023-11-20 (×2): 20 mg via ORAL
  Filled 2023-11-19 (×2): qty 1

## 2023-11-19 NOTE — ED Provider Notes (Signed)
 Emergency Medicine Observation Re-evaluation Note  Norma [name] is a 48 y.o. female, seen on rounds today.  Pt initially presented to the ED for complaints of Psychiatric Evaluation  Currently, the patient is calm, no acute complaints.  Physical Exam  Blood pressure (!) 154/83, pulse 67, temperature 98.1 F (36.7 C), temperature source Oral, resp. rate 17, height 5\' 2"  (1.575 m), weight 88.5 kg, last menstrual period 09/24/2015, SpO2 96%. Physical Exam General: NAD Lungs: CTAB Psych: not agitated  ED Course / MDM  EKG:    I have reviewed the labs performed to date as well as medications administered while in observation.  Recent changes in the last 24 hours include no acute events overnight.    Plan  Current plan is for psych eval. Patient is not under full IVC at this time.   Jacquie Maudlin, MD 11/19/23 [phone]

## 2023-11-19 NOTE — ED Notes (Addendum)
 Per AC, no appropriate beds available for pt.  TTS will fax out.  Macky Sayres, [facility]

## 2023-11-19 NOTE — ED Notes (Addendum)
 RN spoke to daughter Pippin, [place] (Daughter) [phone] [facility]). Per daughter she plans on visiting patient later this afternoon. Inquires if patient is under a 72 hour hold. RN explained ED process.

## 2023-11-19 NOTE — ED Notes (Signed)
 Pt continues to isolate in room, compliant with medication. Pt able to verbalize understanding that she is waiting on inpatient placement.  Cont to monitor as ordered

## 2023-11-19 NOTE — ED Notes (Signed)
 RN received phone call from [place] from [facility] with a bed offer for tomorrow after 9am.

## 2023-11-19 NOTE — Progress Notes (Signed)
 Psychiatric team requested to assess patient. Upon arrival, patient was observed sleeping soundly with audible snoring. Despite attempts, patient was not arousable, and an assessment could not be completed at this time. Plan is for patient to be reassessed during day shift.

## 2023-11-19 NOTE — BH Assessment (Signed)
Consents have been signed

## 2023-11-19 NOTE — ED Notes (Signed)
 Vol / Pt  Pending Placement

## 2023-11-19 NOTE — BH Assessment (Signed)
 Adult MH  Referral information for Psychiatric Hospitalization faxed to:   [facility] [phone]- [phone]),   [address] [phone]),   [facility] [phone] -or- [phone]),   [facility] [phone]),   [facility] [phone] or [phone])   [facility] [phone] or [phone]),   [facility] [phone]).

## 2023-11-19 NOTE — Assessment & Plan Note (Signed)
 Acute psychosis.  She refuses to let me call ambulance to take her to the hospital.  She left the clinic and stated she was going to the ER.

## 2023-11-19 NOTE — Consult Note (Signed)
 [facility] Psychiatric Consult Initial  Patient Name: .[name]  MRN: [id number]  DOB: 03-22-1976  Consult Order details:  Orders (From admission, onward)     Start     Ordered   11/18/23 1650  CONSULT TO CALL ACT TEAM       Ordering Provider: Claria Crofts, MD  Provider:  (Not yet assigned)  Question:  Reason for Consult?  Answer:  Psych consult   11/18/23 1650   11/18/23 1650  IP CONSULT TO PSYCHIATRY       Ordering Provider: Claria Crofts, MD  Provider:  (Not yet assigned)  Question Answer Comment  Consult Timeframe ROUTINE - requires response within 24 hours   Reason for Consult? Consult for medication management   Contact phone number where the requesting provider can be reached SI, hallucinations      11/18/23 1650             Mode of Visit: Tele-visit Virtual Statement:TELE PSYCHIATRY ATTESTATION & CONSENT As the provider for this telehealth consult, I attest that I verified the patient's identity using two separate identifiers, introduced myself to the patient, provided my credentials, disclosed my location, and performed this encounter via a HIPAA-compliant, real-time, face-to-face, two-way, interactive audio and video platform and with the full consent and agreement of the patient (or guardian as applicable.) Patient physical location: [facility] Emergency Department. Telehealth provider physical location: home office in state of [place].   Video start time: 1100 Video end time: 1126    Psychiatry Consult Evaluation  Service Date: Nov 19, 2023 LOS:  LOS: 0 days  Chief Complaint "  Primary Psychiatric Diagnoses  Psychosis, unspecified 2.  GAD 3.  MDD, recurrent, unspecified  Assessment  Norma [name] is a 48 y.o. female admitted: Presented to the EDfor 11/18/2023  5:07 PM for psychosis.  Patient was evaluated in the emergency department 2x within the past 48 hours.  Initially seen on 5/5 with c/o dizziness and migraine but noted to have hyperactive behaviors and rapid  and pressured speech. Was medically cleared and instructed to follow up with outpatient PCP for management of polypharmacy potentially contributing to her presentation. Patient evaluated by Dr. Susan Ziglar her primary care provider on 5/6, with c/o of mgmt of chronic issues and hearing problems.  However, during evaluation she reported she needed a priest, her dog was possessed and a demon was sucking on her head. It appears she became frustrated that no one was addressing her concern and presented to the [facility] emergency department for evaluation.   She carries the psychiatric diagnoses of Major Depressive Disorder, Generalized Anxiety Disorder and new onset of psychosis; She currently denies depressed mood, low motivation, irritability, or weight changes to correlate with onset of psychosis.  She has a past medical history of Gastric Erosion, Duodenal ulcer, Iron deficiency anemia.   Her current presentation of audible and visual hallucinations of hearing the teddy bear talk to her; insomnia, anxiousness and paranoia is most consistent with psychosis. Her BAL and UDS were negative.  She was evaluated and medically cleared; ED provider did not believe there were medical contributions to her presentation. She meets criteria for psychiatric admission based on above.  Current outpatient psychotropic medications include fluoxetine  and aripiprazole and clonazepam  and historically she has had a good response to these medications. She was non compliant with aripiprazole prior to admission as evidenced by patient self reports. Discussed plan for referral for psych admission, patient expressed her desire to go home but after explaining  the goal of improving her AVH, she accepts voluntary admission. She appears personally tormented by AVH, lacks insight and does not demonstrate good decision making capacity; making her a candidate for IVC should she change her mind and refuse voluntary admission.   On initial  examination, patient partially oriented, cannot state date or time. She is anxious and very guarded but cooperative.   Please see plan below for detailed recommendations.   Diagnoses:  Active Hospital problems: Principal Problem:   Psychosis [facility]) Active Problems:   Moderate episode of recurrent major depressive disorder (HCC)   Generalized anxiety disorder    Plan   ## Psychiatric Medication Recommendations:  Pending EKG plan to restart home medications Fluoxetine  40mg  po daily for depression Aripiprazole 2mg  po daily for mood stability Clonazepam  1mg  po BID prn anxiety  ## Medical Decision Making Capacity:  Patient makes her own decisions  ## Further Work-up:  -- Pregnancy Test While pt on Qtc prolonging medications, please monitor & replete K+ to 4 and Mg2+ to 2 -- most recent EKG on 11/17/2023 had QtC of 438 NSR -- Pertinent labwork reviewed earlier this admission includes: CMP, CBC, U/A,UDS   ## Disposition:-- We recommend inpatient psychiatric hospitalization when medically cleared. Patient is under voluntary admission status at this time; please IVC if attempts to leave hospital.  ## Behavioral / Environmental: -Utilize compassion and acknowledge the patient's experiences while setting clear and realistic expectations for care.    ## Safety and Observation Level:  - Based on my clinical evaluation, I estimate the patient to be at low risk of self harm in the current setting. - At this time, we recommend  routine. This decision is based on my review of the chart including patient's history and current presentation, interview of the patient, mental status examination, and consideration of suicide risk including evaluating suicidal ideation, plan, intent, suicidal or self-harm behaviors, risk factors, and protective factors. This judgment is based on our ability to directly address suicide risk, implement suicide prevention strategies, and develop a safety plan while the  patient is in the clinical setting. Please contact our team if there is a concern that risk level has changed.  CSSR Risk Category:C-SSRS RISK CATEGORY: Moderate Risk  Suicide Risk Assessment: Patient has following modifiable risk factors for suicide: recklessness and medication noncompliance, which we are addressing by . Patient has following non-modifiable or demographic risk factors for suicide: current psychosis and lack of insight Patient has the following protective factors against suicide: Supportive family  Thank you for this consult request. Recommendations have been communicated to the primary team.  We will sign off at this time.   Doneen Fuelling, NP       History of Present Illness  Relevant Aspects of Hospital ED Course:  Admitted on 11/18/2023 for evaluation of headaches.   Per RN Triage Note dated 11/18/2023@1601 : "Pt ambulatory to triage.  Pt brought in by her sister.   Pt carrying a stuffed bear.  Pt states the bear is talking to her   Pt is SI and tearful.  Pt denies drugs and etoh.  Pt is Voluntary  Pt also reports headaches.  Pt alert.   .   Pt reports taking tylenol  for her headache." She reports taking fluoxetine    She reports feeing anxious and needs ot get out of the room.   Not sure how much sleep she's sleeping;"  Patient Report 11/19/2023: TTS counselor attempted to assess patient on 5/6@2111 , but patient was too somnolent to participate.  Psych NP also attempted assessment earlier this morning at 0213 but patient was asleep.  Patient observed seated in supply room, with tts counselor close by.  Patient is fairly groomed and wearing hospital scrubs.  When greeted by this writer and given anticipatory guidance she agrees to assessment.  She is alert and oriented to person, person and events that led to current hospitalization. When asked what brought her to the emergency department she states, "I told everybody my teddy bear was talking to me." She states the bear  told her he was being abused and he needed help. She reports hx of depression but denies hx for bipolar or schizophrenia, although psychosis is listed in her chart.  She reports taking fluoxetine  40mg  po daily and clonazepam  1mg  po BID for anxiety.  Her outpatient medication list includes aripiprazole 2mg  po daily which she denies taking.   She's guarded and appears reluctant to answer additional questions regarding AVH. She denies suicidal or homicidal ideations. She denies prior suicide attempt or prior psychiatric hospitalizations.  She lives with her boyfriend; she has grown children but remains in touch with them. She reports she works at [employer] and likes her job.   Patient asks to go home a few times during the assessment. She appears to lack insight and does not believe AVH is a problem for her.    Per ED Provider Admission Assessment 11/19/2023 History    Psychiatric Evaluation     HPI   NATINA PIZZOLATO is a 48 year old female with history of HTN, migraines presenting to the emergency department for evaluation of suicidal ideation and hallucinations.  Patient reports she has been having ongoing headaches for the past several days.  Seen in our ER yesterday with improvement and was discharged.  However, she reports that last night she had onset of seeing visual hallucinations.  Today she reports onset of auditory hallucinations and that if there is a "demon in her teddy bear".  Denies command hallucinations.  She additionally reports suicidal ideation.  Denies any specific plan, but reports she does have access to a gun.  Denies history of AVH in the past.  Psych ROS:  Depression: denies Anxiety:  yes Mania (lifetime and current): denies Psychosis: (lifetime and current): current  Collateral information:  Deferred  Review of Systems  Constitutional: Negative.   HENT: Negative.    Eyes: Negative.   Respiratory: Negative.    Cardiovascular: Negative.   Gastrointestinal:  Negative.   Genitourinary: Negative.   Musculoskeletal: Negative.   Skin: Negative.   Neurological: Negative.   Endo/Heme/Allergies: Negative.   Psychiatric/Behavioral:  Positive for hallucinations. Negative for substance abuse and suicidal ideas. The patient is nervous/anxious and has insomnia.      Psychiatric and Social History  Psychiatric History:  Information collected from patient and chart review  Prev Dx/Sx: MDD, GAD, tobacco abuse Current Psych Provider: denies Home Meds (current): as listed below Previous Med Trials: n/a Therapy: denies  Prior Psych Hospitalization: she denies  Prior Self Harm: she denies Prior Violence: she denies  Family Psych History: she denies Family Hx suicide: pt denies  Social History:  Developmental Hx: deferred d/t patient guarded and anxious presentation. Educational Hx: deferred Occupational Hx: works at [employer] Hx: denies Living Situation: lives with her boyfriend Spiritual Hx: deferred Access to weapons/lethal means: denies   Substance History Alcohol: denies  Tobacco: yes, smokes cigarettes Illicit drugs: she denies Prescription drug abuse: she denies Rehab hx: denies  Exam Findings  Physical Exam:  Vital Signs:  Temp:  [97.9 F (36.6 C)-98.1 F (36.7 C)] 98.1 F (36.7 C) (05/07 0838) Pulse Rate:  [67-95] 67 (05/07 0838) Resp:  [17-20] 17 (05/07 0838) BP: (110-161)/(74-103) 154/83 (05/07 0838) SpO2:  [95 %-96 %] 96 % (05/07 0838) Weight:  [88.5 kg] 88.5 kg (05/06 1603) Blood pressure (!) 154/83, pulse 67, temperature 98.1 F (36.7 C), temperature source Oral, resp. rate 17, height 5\' 2"  (1.575 m), weight 88.5 kg, last menstrual period 09/24/2015, SpO2 96%. Body mass index is 35.67 kg/m.  Physical Exam Cardiovascular:     Rate and Rhythm: Normal rate.  Pulmonary:     Effort: Pulmonary effort is normal.  Musculoskeletal:        General: Normal range of motion.     Cervical back: Normal range of motion.   Neurological:     Mental Status: She is alert.  Psychiatric:        Attention and Perception: She perceives auditory and visual hallucinations.        Mood and Affect: Mood is anxious.        Speech: Speech normal.        Behavior: Behavior is cooperative.        Thought Content: Thought content is paranoid and delusional. Thought content does not include homicidal or suicidal ideation. Thought content does not include homicidal or suicidal plan.        Cognition and Memory: Cognition is impaired. Memory is impaired.        Judgment: Judgment is impulsive.     Mental Status Exam: General Appearance: Fairly Groomed and Guarded  Orientation:  Other:  alert and oriented to person, place and events that led to hospitalization but she is unsure of today's date or time  Memory:  Immediate;   Fair Recent;   Fair Remote;   Fair  Concentration:  Concentration: Poor and Attention Span: Poor  Recall:  Fair  Attention  Poor  Eye Contact:  Fair  Speech:  Normal Rate  Language:  Good  Volume:  Normal  Mood: "Anxious"  Affect:  Congruent and Constricted  Thought Process:  Linear  Thought Content:  Illogical, Delusions, and Hallucinations: Auditory Visual  Suicidal Thoughts:  No  Homicidal Thoughts:  No  Judgement:  Impaired  Insight:  Lacking  Psychomotor Activity:  Increased  Akathisia:  No  Fund of Knowledge:  Fair      Assets:  Engineer, maintenance Social Support  Cognition:  Impaired,  Mild  ADL's:  Intact  AIMS (if indicated):        Other History   These have been pulled in through the EMR, reviewed, and updated if appropriate.  Family History:  The patient's family history includes Birth defects in her daughter; COPD in her mother; Diabetes in her father; Heart disease in her maternal aunt and sister; Hypertension in her father; Kidney disease in her daughter; Seizures in her maternal aunt; Stroke in her father.  Medical History: Past Medical History:   Diagnosis Date   AKI (acute kidney injury) (HCC) 06/04/2016   Anxiety    Bacteremia 06/04/2016   Choledocholithiasis 03/01/2020   Cholelithiasis 03/02/2020   COPD (chronic obstructive pulmonary disease) (HCC)    Depression    Diarrhea 06/04/2016   GERD (gastroesophageal reflux disease)    History of kidney stones    Hypertension    Hypokalemia 06/04/2016   IBS (irritable bowel syndrome)    Nausea and vomiting 06/04/2016   Positive blood culture  06/04/2016   Rectal fissure 10/04/2015   Surgery, elective 09/22/2019    Surgical History: Past Surgical History:  Procedure Laterality Date   ABDOMINAL HYSTERECTOMY N/A 11/09/2015   Procedure: HYSTERECTOMY ABDOMINAL;  Surgeon: Albino Hum, MD;  Location: [facility];  Service: Gynecology;  Laterality: N/A;   CHOLECYSTECTOMY N/A 03/03/2020   Procedure: LAPAROSCOPIC CHOLECYSTECTOMY;  Surgeon: Alanda Allegra, MD;  Location: [facility];  Service: General;  Laterality: N/A;   COLONOSCOPY WITH PROPOFOL  N/A 03/29/2021   Procedure: COLONOSCOPY WITH PROPOFOL ;  Surgeon: Irby Mannan, MD;  Location: [facility] ENDOSCOPY;  Service: Endoscopy;  Laterality: N/A;   COLONOSCOPY WITH PROPOFOL  N/A 09/05/2021   Procedure: COLONOSCOPY WITH PROPOFOL ;  Surgeon: Selena Daily, MD;  Location: [facility] ENDOSCOPY;  Service: Gastroenterology;  Laterality: N/A;   CYSTOSCOPY WITH BIOPSY N/A 09/30/2022   Procedure: CYSTOSCOPY WITH BLADDER BIOPSY;  Surgeon: Dustin Gimenez, MD;  Location: [facility];  Service: Urology;  Laterality: N/A;   CYSTOSCOPY/URETEROSCOPY/HOLMIUM LASER/STENT PLACEMENT Right 09/30/2022   Procedure: CYSTOSCOPY/URETEROSCOPY/HOLMIUM LASER/STENT PLACEMENT;  Surgeon: Dustin Gimenez, MD;  Location: [facility];  Service: Urology;  Laterality: Right;   DILATION AND CURETTAGE OF UTERUS     ESOPHAGOGASTRODUODENOSCOPY (EGD) WITH PROPOFOL  N/A 09/05/2021   Procedure: ESOPHAGOGASTRODUODENOSCOPY (EGD) WITH PROPOFOL ;  Surgeon: Selena Daily, MD;  Location: [facility]  ENDOSCOPY;  Service: Gastroenterology;  Laterality: N/A;   SALPINGOOPHORECTOMY Bilateral 11/09/2015   Procedure: SALPINGO OOPHORECTOMY;  Surgeon: Albino Hum, MD;  Location: [facility];  Service: Gynecology;  Laterality: Bilateral;     Medications:   Current Facility-Administered Medications:    albuterol  (VENTOLIN  HFA) 108 (90 Base) MCG/ACT inhaler 2 puff, 2 puff, Inhalation, Q6H PRN, Orvil Bland E, NP   ARIPiprazole (ABILIFY) tablet 2 mg, 2 mg, Oral, Daily, Zoanne Newill E, NP   clonazePAM  (KLONOPIN ) tablet 1 mg, 1 mg, Oral, BID, Matalyn Nawaz E, NP, 1 mg at 11/19/23 1254   FLUoxetine  (PROZAC ) capsule 40 mg, 40 mg, Oral, Daily, Miliyah Luper E, NP, 40 mg at 11/19/23 1254   lisinopril  (ZESTRIL ) tablet 20 mg, 20 mg, Oral, Daily, Franziska Podgurski E, NP, 20 mg at 11/19/23 1254   pantoprazole  (PROTONIX ) EC tablet 40 mg, 40 mg, Oral, Daily, Kalie Cabral E, NP, 40 mg at 11/19/23 1254  Current Outpatient Medications:    albuterol  (VENTOLIN  HFA) 108 (90 Base) MCG/ACT inhaler, Inhale 2 puffs into the lungs every 6 (six) hours as needed for wheezing or shortness of breath., Disp: 8 g, Rfl: 2   clonazePAM  (KLONOPIN ) 1 MG tablet, Take 1 tablet (1 mg total) by mouth 2 (two) times daily as needed for anxiety., Disp: 60 tablet, Rfl: 1   FLUoxetine  (PROZAC ) 40 MG capsule, Take 40 mg by mouth daily., Disp: , Rfl:    lansoprazole (PREVACID) 30 MG capsule, Take 30 mg by mouth 2 (two) times daily., Disp: , Rfl:    lisinopril  (ZESTRIL ) 20 MG tablet, Take 20 mg by mouth daily., Disp: , Rfl:    promethazine  (PHENERGAN ) 25 MG tablet, Take 1 tablet (25 mg total) by mouth every 6 (six) hours as needed for nausea or vomiting., Disp: 30 tablet, Rfl: 0   ARIPiprazole (ABILIFY) 2 MG tablet, Take 2 mg by mouth daily. (Patient not taking: Reported on 11/19/2023), Disp: , Rfl:    fluticasone (FLONASE) 50 MCG/ACT nasal spray, Place 1 spray into both nostrils daily., Disp: , Rfl:    ibuprofen  (ADVIL ) 800 MG tablet, TAKE ONE  TABLET (800 MG TOTAL) BY MOUTH EVERY SIX HOURS AS NEEDED. (Patient not  taking: Reported on 11/19/2023), Disp: 60 tablet, Rfl: 1   nystatin cream (MYCOSTATIN), Apply 1 Application topically as needed. (Patient not taking: Reported on 11/19/2023), Disp: , Rfl:    oxybutynin  (DITROPAN ) 5 MG tablet, Take 1 tablet (5 mg total) by mouth every 8 (eight) hours as needed for bladder spasms. (Patient not taking: Reported on 11/18/2023), Disp: 30 tablet, Rfl: 0   oxyCODONE -acetaminophen  (PERCOCET) 5-325 MG tablet, Take 1 tablet by mouth every 4 (four) hours as needed for severe pain (pain score 7-10). (Patient not taking: Reported on 11/18/2023), Disp: 30 tablet, Rfl: 0   Rimegepant Sulfate (NURTEC) 75 MG TBDP, Take 1 tablet (75 mg total) by mouth every other day. (Patient not taking: Reported on 11/18/2023), Disp: 15 tablet, Rfl: 11   SPIRIVA  RESPIMAT 1.25 MCG/ACT AERS, 2 puffs daily (Patient not taking: Reported on 11/19/2023), Disp: , Rfl:    SUMAtriptan  (IMITREX ) 50 MG tablet, Take 1 tablet (50 mg total) by mouth every 2 (two) hours as needed for migraine. May repeat in 2 hours if headache persists or recurs. (Patient not taking: Reported on 11/18/2023), Disp: 10 tablet, Rfl: 3  Allergies: Allergies  Allergen Reactions   Sulfa Antibiotics Hives    Doneen Fuelling, NP
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
## 2023-11-19 NOTE — ED Notes (Signed)
 Pt given snack and drink.

## 2023-11-19 NOTE — BH Assessment (Addendum)
 Patient has been accepted to [facility] on tonight 11/19/23.  Patient assigned to room [facility]. Accepting physician is Dr. Reinhold Carbine.  Call report to [phone].  Representative was [employer].   ER Staff is aware of it:  Carlena, ER Secretary   Jennings Mohr, Patient's Nurse Dr. Felipe Horton     TTS attempted to contact both patient's daughter Jyl Or [phone] and patient's boyfriend, Rich Champ [phone]. No answer.

## 2023-11-19 NOTE — Consult Note (Cosign Needed Addendum)
 Patient evaluated by psychiatric team and recommended for psych admission.  Her home medications were restarted.    Complete psych consult note pending.

## 2023-11-20 NOTE — ED Provider Notes (Signed)
-----------------------------------------   10:19 AM on 11/20/2023 -----------------------------------------  Patient is stating that she wishes to leave the emergency department.  Patient is here voluntarily.  However in reading the psychiatry note they recommend inpatient treatment and IVC if the patient should attempt to leave.  I have personally seen and evaluated the patient.  Will maintain psychiatry's recommendation IVC the patient for inpatient treatment.  I have placed the patient under an IVC.  I have discussed this with the patient, she still thinks that she is able to go home and does not want to be transferred to an inpatient facility but understands that she is now under an IVC and it is up to psychiatry at this point when she will be able to go home.   Ruth Cove, MD 11/20/23 1020

## 2023-11-20 NOTE — ED Notes (Signed)
 EDP to bedside to explain to pt about IVC. Pt tearful but calm at this time. Pt made aware of IVC and inability to leave facility at this time. Pt compliant at this time.

## 2023-11-20 NOTE — ED Notes (Signed)
 This RN spoke to Engineer, technical sales at [employer]. RN updated receiving RN about pt being placed under IVC.

## 2023-11-20 NOTE — ED Notes (Signed)
Pt given lunch tray and beverage 

## 2023-11-20 NOTE — ED Notes (Signed)
 Pt stating to this RN that she is wanting to leave and go home. RN informed pt that she has a inpatient treatment room at [facility]. Pt reporting to this RN that she does not want to go to old [place]. Pt stating her sister can come pick her up. Pt denying any SI/HI, auditory or visual hallucinations. No delusions noted. Pt calm and acting appropriate for situation. Pt agreeable to stay until psychiatry team has been notified.

## 2023-11-20 NOTE — ED Notes (Signed)
 Patient accepted to [facility] /called for transport last night safe transport was unable to transport @ that time said call back this am for transport spoke with Alvie Jolly

## 2023-11-20 NOTE — ED Notes (Signed)
Pt's daughter called. Pt given phone to talk to daughter

## 2023-11-20 NOTE — ED Notes (Signed)
 RN attempted to call report to [employer] [facility]. Unable to give report at this time.

## 2023-12-04 ENCOUNTER — Ambulatory Visit (INDEPENDENT_AMBULATORY_CARE_PROVIDER_SITE_OTHER): Payer: MEDICAID | Admitting: Family Medicine

## 2023-12-04 ENCOUNTER — Encounter: Payer: Self-pay | Admitting: Family Medicine

## 2023-12-04 VITALS — BP 121/81 | HR 60 | Temp 98.2°F | Resp 18 | Ht 62.0 in | Wt 203.0 lb

## 2023-12-04 DIAGNOSIS — G43E01 Chronic migraine with aura, not intractable, with status migrainosus: Secondary | ICD-10-CM | POA: Diagnosis not present

## 2023-12-04 DIAGNOSIS — Z0283 Encounter for blood-alcohol and blood-drug test: Secondary | ICD-10-CM

## 2023-12-04 MED ORDER — KETOROLAC TROMETHAMINE 30 MG/ML IM SOLN
30.0000 mg | Freq: Once | INTRAMUSCULAR | Status: AC
  Administered 2023-12-04: 30 mg via INTRAMUSCULAR

## 2023-12-04 NOTE — Progress Notes (Unsigned)
   Established Patient Office Visit  Subjective   Patient ID: Norma [name], female    DOB: 05-05-1976  Age: 48 y.o. MRN: [id number]  Chief Complaint  Patient presents with   Hospitalization Follow-up    HPI Delightful 48 year old woman with migraine headaches, anxiety and depression,  Hx Iron deficiency anemia, s/p hysterectomy, chronic lumbar pain.   She is just released from an IVC hospitalization for suicidal ideation.  She is here for a urine drug screen.  She does not know her prescription medications.  Called the pharmacy to obtain her medication list: Klonopin  sublingual, Abilify , sleep medication and ADHD treatment guanfacine   She has a follow-up appointment with psychiatry in 3 weeks.  She will follow up with [facility] in [place].  She reports that she is feeling much better mentally.  She has not picked up her sleep medicine and ask if I can write her for something today.  She also inquires whether she qualifies for disability. She reports she has a headache that started yesterday.  She is taken Tylenol  and had no relief.  She request a Toradol  injection.  She does not have a referral to neurology. She is snoring and has wake to gasping for breath.  She has never had a sleep study.  {History (Optional):23778}  ROS    Objective:     BP 121/81 (BP Location: Left Arm, Patient Position: Sitting, Cuff Size: Normal)   Pulse 60   Temp 98.2 F (36.8 C) (Oral)   Resp 18   Ht 5\' 2"  (1.575 m)   Wt 203 lb (92.1 kg)   LMP 09/24/2015   SpO2 96%   BMI 37.13 kg/m  {Vitals History (Optional):23777}  Physical Exam Vitals and nursing note reviewed.  Constitutional:      Appearance: Normal appearance.  HENT:     Head: Normocephalic and atraumatic.  Eyes:     Conjunctiva/sclera: Conjunctivae normal.  Cardiovascular:     Rate and Rhythm: Normal rate and regular rhythm.  Pulmonary:     Effort: Pulmonary effort is normal.     Breath sounds: Normal breath sounds.   Musculoskeletal:     Right lower leg: No edema.     Left lower leg: No edema.  Skin:    General: Skin is warm and dry.  Neurological:     Mental Status: She is alert and oriented to person, place, and time.  Psychiatric:        Mood and Affect: Mood normal.        Behavior: Behavior normal.        Thought Content: Thought content normal.        Judgment: Judgment normal.      {Perform Simple Foot Exam  Perform Detailed exam:1} {Insert foot Exam (Optional):30965}   No results found for any visits on 12/04/23.  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    Kissa Campoy K Shareeka Yim, MD

## 2023-12-07 NOTE — Assessment & Plan Note (Signed)
 Psychiatry requested drug screening posthospitalization.

## 2023-12-07 NOTE — Assessment & Plan Note (Signed)
 I believe she has taken an Invega in the past and done well.  Her insurance has denied Nurtec and Aimovig .  Will refer back to neurology to see if they can get these things approved.  She may have sleep apnea.  Will call patient and do Epworth sleep scale questions and then refer for sleep study.

## 2023-12-09 ENCOUNTER — Other Ambulatory Visit: Payer: Self-pay | Admitting: Family Medicine

## 2023-12-09 DIAGNOSIS — F1910 Other psychoactive substance abuse, uncomplicated: Secondary | ICD-10-CM

## 2023-12-09 LAB — COMPLIANCE DRUG ANALYSIS, UR

## 2023-12-10 ENCOUNTER — Telehealth: Payer: Self-pay

## 2023-12-10 NOTE — Telephone Encounter (Signed)
(  KeyJoleen Navy)  PA Case ID #: [id number]  Rx #: [id number]   Status Sent to Plan today  Drug Nurtec 75MG  dispersible tablets   Form [employer] Electronic Prior Authorization Form Original Claim Info 75

## 2023-12-10 NOTE — Telephone Encounter (Signed)
 Called patient and reviewed Epworth Sleepiness Scale questions. Patient scored 11. Provider has been notified.

## 2023-12-10 NOTE — Telephone Encounter (Signed)
-----   Message ----- From: Ziglar, Susan K, MD Sent: 12/07/2023   2:43 PM EDT To: Evans Him, CMA Subject: Epworth sleep scale                            Please call patient and ask her the Epworth sleep scale questions.  This has to be done before she can have a sleep study referral.  Thank you

## 2023-12-12 ENCOUNTER — Telehealth: Payer: Self-pay | Admitting: Podiatry

## 2023-12-12 NOTE — Telephone Encounter (Signed)
 Patient is requesting pain medicine, (Oxycodone )

## 2023-12-15 ENCOUNTER — Ambulatory Visit (INDEPENDENT_AMBULATORY_CARE_PROVIDER_SITE_OTHER): Payer: MEDICAID | Admitting: Family Medicine

## 2023-12-15 ENCOUNTER — Encounter: Payer: Self-pay | Admitting: Family Medicine

## 2023-12-15 VITALS — BP 138/88 | HR 62 | Temp 97.5°F | Resp 18 | Ht 62.0 in | Wt 207.0 lb

## 2023-12-15 DIAGNOSIS — F419 Anxiety disorder, unspecified: Secondary | ICD-10-CM

## 2023-12-15 DIAGNOSIS — G43701 Chronic migraine without aura, not intractable, with status migrainosus: Secondary | ICD-10-CM | POA: Diagnosis not present

## 2023-12-15 DIAGNOSIS — M7918 Myalgia, other site: Secondary | ICD-10-CM

## 2023-12-15 DIAGNOSIS — Z79899 Other long term (current) drug therapy: Secondary | ICD-10-CM

## 2023-12-15 DIAGNOSIS — F1390 Sedative, hypnotic, or anxiolytic use, unspecified, uncomplicated: Secondary | ICD-10-CM

## 2023-12-15 DIAGNOSIS — F900 Attention-deficit hyperactivity disorder, predominantly inattentive type: Secondary | ICD-10-CM | POA: Diagnosis not present

## 2023-12-15 MED ORDER — PROMETHAZINE HCL 25 MG PO TABS: 25.0000 mg | ORAL_TABLET | Freq: Four times a day (QID) | ORAL | 0 refills | Status: DC | PRN
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
## 2023-12-15 NOTE — Assessment & Plan Note (Signed)
 Has had pain for a week.  Wants to go to orthopedist for a shot.  Encouraged her to use topical analgesic along with ibuprofen  and physical therapy but she would prefer just to get a shot.
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
## 2023-12-15 NOTE — Addendum Note (Signed)
 Addended by: Harnoor Kohles K on: 12/15/2023 02:04 PM   Modules accepted: Orders

## 2023-12-15 NOTE — Addendum Note (Signed)
 Addended by: Rashae Rother K on: 12/15/2023 02:03 PM   Modules accepted: Orders

## 2023-12-15 NOTE — Addendum Note (Signed)
 Addended by: Evans Him on: 12/15/2023 01:21 PM   Modules accepted: Orders

## 2023-12-16 LAB — SPECIMEN STATUS REPORT

## 2023-12-16 LAB — PAIN MGT SCRN (14 DRUGS), UR
Amphetamine Scrn, Ur: NEGATIVE ng/mL
BARBITURATE SCREEN URINE: NEGATIVE ng/mL
BENZODIAZEPINE SCREEN, URINE: NEGATIVE ng/mL
Buprenorphine, Urine: NEGATIVE ng/mL
CANNABINOIDS UR QL SCN: NEGATIVE ng/mL
Cocaine (Metab) Scrn, Ur: NEGATIVE ng/mL
Creatinine(Crt), U: 39.1 mg/dL (ref 20.0–300.0)
Fentanyl, Urine: NEGATIVE pg/mL
Meperidine Screen, Urine: NEGATIVE ng/mL
Methadone Screen, Urine: NEGATIVE ng/mL
OXYCODONE+OXYMORPHONE UR QL SCN: NEGATIVE ng/mL
Opiate Scrn, Ur: POSITIVE ng/mL — AB
Ph of Urine: 5.5 (ref 4.5–8.9)
Phencyclidine Qn, Ur: NEGATIVE ng/mL
Propoxyphene Scrn, Ur: NEGATIVE ng/mL
Tramadol Screen, Urine: NEGATIVE ng/mL

## 2023-12-16 LAB — DRUG SCREEN 13 W/CONF, WB

## 2023-12-25 ENCOUNTER — Other Ambulatory Visit: Payer: Self-pay

## 2023-12-25 ENCOUNTER — Ambulatory Visit (INDEPENDENT_AMBULATORY_CARE_PROVIDER_SITE_OTHER): Payer: MEDICAID | Admitting: Family Medicine

## 2023-12-25 DIAGNOSIS — E559 Vitamin D deficiency, unspecified: Secondary | ICD-10-CM

## 2023-12-25 DIAGNOSIS — I10 Essential (primary) hypertension: Secondary | ICD-10-CM

## 2023-12-25 DIAGNOSIS — D508 Other iron deficiency anemias: Secondary | ICD-10-CM

## 2023-12-26 LAB — VITAMIN D 25 HYDROXY (VIT D DEFICIENCY, FRACTURES): Vit D, 25-Hydroxy: 22.9 ng/mL — ABNORMAL LOW (ref 30.0–100.0)

## 2023-12-26 LAB — IRON,TIBC AND FERRITIN PANEL
Ferritin: 19 ng/mL (ref 15–150)
Iron Saturation: 15 % (ref 15–55)
Iron: 54 ug/dL (ref 27–159)
Total Iron Binding Capacity: 365 ug/dL (ref 250–450)
UIBC: 311 ug/dL (ref 131–425)

## 2023-12-26 LAB — TSH: TSH: 1.21 u[IU]/mL (ref 0.450–4.500)

## 2023-12-27 ENCOUNTER — Other Ambulatory Visit: Payer: Self-pay

## 2023-12-27 ENCOUNTER — Emergency Department: Admission: EM | Admit: 2023-12-27 | Discharge: 2023-12-27 | Discharge status: Against Medical Advice | Payer: MEDICAID

## 2023-12-27 ENCOUNTER — Emergency Department: Payer: MEDICAID

## 2023-12-27 DIAGNOSIS — J449 Chronic obstructive pulmonary disease, unspecified: Secondary | ICD-10-CM | POA: Diagnosis not present

## 2023-12-27 DIAGNOSIS — R0789 Other chest pain: Secondary | ICD-10-CM | POA: Insufficient documentation

## 2023-12-27 DIAGNOSIS — I10 Essential (primary) hypertension: Secondary | ICD-10-CM | POA: Insufficient documentation

## 2023-12-27 LAB — BASIC METABOLIC PANEL WITH GFR
Anion gap: 11 (ref 5–15)
BUN: 21 mg/dL — ABNORMAL HIGH (ref 6–20)
CO2: 21 mmol/L — ABNORMAL LOW (ref 22–32)
Calcium: 9 mg/dL (ref 8.9–10.3)
Chloride: 107 mmol/L (ref 98–111)
Creatinine, Ser: 0.75 mg/dL (ref 0.44–1.00)
GFR, Estimated: >60 mL/min (ref >60)
Glucose, Bld: 82 mg/dL (ref 70–99)
Potassium: 3.9 mmol/L (ref 3.5–5.1)
Sodium: 139 mmol/L (ref 135–145)

## 2023-12-27 LAB — HEPATIC FUNCTION PANEL
ALT: 16 U/L (ref 0–44)
AST: 18 U/L (ref 15–41)
Albumin: 3.7 g/dL (ref 3.5–5.0)
Alkaline Phosphatase: 65 U/L (ref 38–126)
Bilirubin, Direct: 0.1 mg/dL (ref 0.0–0.2)
Indirect Bilirubin: 0.9 mg/dL (ref 0.3–0.9)
Total Bilirubin: 1 mg/dL (ref 0.0–1.2)
Total Protein: 6.6 g/dL (ref 6.5–8.1)

## 2023-12-27 LAB — CBC
HCT: 38.8 % (ref 36.0–46.0)
Hemoglobin: 12.5 g/dL (ref 12.0–15.0)
MCH: 28.8 pg (ref 26.0–34.0)
MCHC: 32.2 g/dL (ref 30.0–36.0)
MCV: 89.4 fL (ref 80.0–100.0)
Platelets: 279 10*3/uL (ref 150–400)
RBC: 4.34 MIL/uL (ref 3.87–5.11)
RDW: 14.8 % (ref 11.5–15.5)
WBC: 8.6 10*3/uL (ref 4.0–10.5)
nRBC: 0 % (ref 0.0–0.2)

## 2023-12-27 LAB — LIPASE, BLOOD: Lipase: 30 U/L (ref 11–51)

## 2023-12-27 LAB — TROPONIN I (HIGH SENSITIVITY): Troponin I (High Sensitivity): 4 ng/L (ref <18)

## 2023-12-27 MED ORDER — ACETAMINOPHEN 500 MG PO TABS
1000.0000 mg | ORAL_TABLET | Freq: Once | ORAL | Status: AC
  Administered 2023-12-27: 1000 mg via ORAL
  Filled 2023-12-27: qty 2

## 2023-12-27 MED ORDER — LIDOCAINE 5 % EX PTCH
1.0000 | MEDICATED_PATCH | Freq: Once | CUTANEOUS | Status: DC
  Administered 2023-12-27: 1 via TRANSDERMAL
  Filled 2023-12-27: qty 1

## 2023-12-27 MED ORDER — KETOROLAC TROMETHAMINE 30 MG/ML IJ SOLN
30.0000 mg | Freq: Once | INTRAMUSCULAR | Status: AC
  Administered 2023-12-27: 30 mg via INTRAMUSCULAR
  Filled 2023-12-27: qty 1

## 2023-12-27 NOTE — ED Triage Notes (Signed)
 Coming from home, c/o chest pain that started suddenly at work yesterday night. Pt reports intermittent sharp pain but a constant ache. Pt reports SOB, dizziness and nausea with it. PMH: HTN

## 2023-12-27 NOTE — ED Provider Notes (Signed)
 [facility] Provider Note    Event Date/Time   First MD Initiated Contact with Patient 12/27/23 [phone]     (approximate)   History   Chest Pain  Coming from home, c/o chest pain that started suddenly at work yesterday night. Pt reports intermittent sharp pain but a constant ache. Pt reports SOB, dizziness and nausea with it. PMH: HTN   HPI EMALYN SCHOU is a 48 y.o. female PMH COPD, hypertension, iron deficiency anemia, duodenal ulcer presents for evaluation of chest pain -Left chest wall immediately beneath L breast, constant, sharp pain with movement.  Does lift objects at work.  No preceding trauma. -Not exertional.  Primarily notes with ranging of left arm and twisting of torso. -Has not taken any pain medications -No frank abdominal discomfort -No cough, no significant shortness of breath -No recent surgery/stasis/travel, no history of DVT/PE, no hormone use     Physical Exam   Triage Vital Signs: ED Triage Vitals  Encounter Vitals Group     BP 12/27/23 0757 (!) 160/96     Girls Systolic BP Percentile --      Girls Diastolic BP Percentile --      Boys Systolic BP Percentile --      Boys Diastolic BP Percentile --      Pulse Rate 12/27/23 0757 61     Resp 12/27/23 0757 18     Temp 12/27/23 0757 97.7 F (36.5 C)     Temp Source 12/27/23 0757 Oral     SpO2 12/27/23 0757 99 %     Weight --      Height --      Head Circumference --      Peak Flow --      Pain Score 12/27/23 0758 9     Pain Loc --      Pain Education --      Exclude from Growth Chart --     Most recent vital signs: Vitals:   12/27/23 0757  BP: (!) 160/96  Pulse: 61  Resp: 18  Temp: 97.7 F (36.5 C)  SpO2: 99%     General: Awake, no distress.  CV:  Good peripheral perfusion. RRR, RP 2+. + Tender to palpation just beneath the left breast on anterior chest wall.  No overlying skin change.  No deformity. Resp:  Normal effort. CTAB Abd:  No distention.  Trace  tenderness in left upper quadrant only, no tenderness to be palpation elsewhere.    ED Results / Procedures / Treatments   Labs (all labs ordered are listed, but only abnormal results are displayed) Labs Reviewed  BASIC METABOLIC PANEL WITH GFR - Abnormal; Notable for the following components:      Result Value   CO2 21 (*)    BUN 21 (*)    All other components within normal limits  CBC  HEPATIC FUNCTION PANEL  LIPASE, BLOOD  TROPONIN I (HIGH SENSITIVITY)     EKG  Ecg = sinus rhythm, rate 51, no ST elevation or depression, no significant repolarization abnormality, normal axis, normal intervals.  No evidence of ischemia nor arrhythmia on my interpretation.   RADIOLOGY Radiology interpreted by myself and radiology report reviewed.  No acute pathology appreciated.    PROCEDURES:  Critical Care performed: No  Procedures   MEDICATIONS ORDERED IN ED: Medications  ketorolac  (TORADOL ) 30 MG/ML injection 30 mg (30 mg Intramuscular Given 12/27/23 1016)  acetaminophen  (TYLENOL ) tablet 1,000 mg (1,000 mg Oral Given 12/27/23 1017)  IMPRESSION / MDM / ASSESSMENT AND PLAN / ED COURSE  I reviewed the triage vital signs and the nursing notes.                              DDX/MDM/AP: Differential diagnosis includes, but is not limited to, highly suspect MSK strain, consider pleurisy, highly doubt ACS.  Do not clinically suspect PE, PERC negative.  Do not suspect aortic dissection, pneumothorax.  Plan: - Labs - EKG - Chest x-ray - Tylenol , Toradol , Lidoderm  patch  Patient's presentation is most consistent with acute presentation with potential threat to life or bodily function.   ED course below.  Workup unremarkable, added LFTs though these appear to have been canceled after patient eloped from the emergency department.  Not clinically concern for cardiac etiology at this time and had previously recommended that she follow-up with her primary care doctor and continue  treatment with Tylenol  and Motrin  for likely MSK strain.  Clinical Course as of 12/27/23 1622  Sat Dec 27, 2023  0835 Cbc, bmp wnl [MM]  [phone] Chest x-ray reviewed, no acute pathology on my interpretation [MM]  0944 Trop wnl [MM]    Clinical Course User Index [MM] Collis Deaner, MD     FINAL CLINICAL IMPRESSION(S) / ED DIAGNOSES   Final diagnoses:  Chest wall pain     Rx / DC Orders   ED Discharge Orders     None        Note:  This document was prepared using Dragon voice recognition software and may include unintentional dictation errors.   Collis Deaner, MD 12/27/23 1622

## 2023-12-27 NOTE — ED Notes (Signed)
 Pt taken off all monitoring at this time. Pt requesting d/c paperwork because she needs to go to work. MD made aware.

## 2023-12-28 ENCOUNTER — Emergency Department: Payer: MEDICAID

## 2023-12-28 ENCOUNTER — Other Ambulatory Visit: Payer: Self-pay

## 2023-12-28 ENCOUNTER — Emergency Department
Admission: EM | Admit: 2023-12-28 | Discharge: 2023-12-28 | Disposition: A | Discharge status: Home / Self Care | Payer: MEDICAID

## 2023-12-28 DIAGNOSIS — R0789 Other chest pain: Secondary | ICD-10-CM | POA: Insufficient documentation

## 2023-12-28 DIAGNOSIS — S8002XA Contusion of left knee, initial encounter: Secondary | ICD-10-CM | POA: Insufficient documentation

## 2023-12-28 DIAGNOSIS — S9032XA Contusion of left foot, initial encounter: Secondary | ICD-10-CM | POA: Diagnosis not present

## 2023-12-28 DIAGNOSIS — J449 Chronic obstructive pulmonary disease, unspecified: Secondary | ICD-10-CM | POA: Insufficient documentation

## 2023-12-28 DIAGNOSIS — S062XAA Diffuse traumatic brain injury with loss of consciousness status unknown, initial encounter: Secondary | ICD-10-CM

## 2023-12-28 DIAGNOSIS — I10 Essential (primary) hypertension: Secondary | ICD-10-CM | POA: Diagnosis not present

## 2023-12-28 DIAGNOSIS — S60222A Contusion of left hand, initial encounter: Secondary | ICD-10-CM | POA: Insufficient documentation

## 2023-12-28 DIAGNOSIS — S60221A Contusion of right hand, initial encounter: Secondary | ICD-10-CM | POA: Insufficient documentation

## 2023-12-28 DIAGNOSIS — S0990XA Unspecified injury of head, initial encounter: Secondary | ICD-10-CM | POA: Diagnosis present

## 2023-12-28 DIAGNOSIS — S0633AA Contusion and laceration of cerebrum, unspecified, with loss of consciousness status unknown, initial encounter: Secondary | ICD-10-CM | POA: Diagnosis not present

## 2023-12-28 LAB — CBC WITH DIFFERENTIAL/PLATELET
Abs Immature Granulocytes: 0.05 10*3/uL (ref 0.00–0.07)
Basophils Absolute: 0.1 10*3/uL (ref 0.0–0.1)
Basophils Relative: 1 %
Eosinophils Absolute: 0.1 10*3/uL (ref 0.0–0.5)
Eosinophils Relative: 1 %
HCT: 41.2 % (ref 36.0–46.0)
Hemoglobin: 13.2 g/dL (ref 12.0–15.0)
Immature Granulocytes: 0 %
Lymphocytes Relative: 8 %
Lymphs Abs: 0.9 10*3/uL (ref 0.7–4.0)
MCH: 28.9 pg (ref 26.0–34.0)
MCHC: 32 g/dL (ref 30.0–36.0)
MCV: 90.4 fL (ref 80.0–100.0)
Monocytes Absolute: 0.9 10*3/uL (ref 0.1–1.0)
Monocytes Relative: 7 %
Neutro Abs: 10.2 10*3/uL — ABNORMAL HIGH (ref 1.7–7.7)
Neutrophils Relative %: 83 %
Platelets: 273 10*3/uL (ref 150–400)
RBC: 4.56 MIL/uL (ref 3.87–5.11)
RDW: 14.8 % (ref 11.5–15.5)
WBC: 12.2 10*3/uL — ABNORMAL HIGH (ref 4.0–10.5)
nRBC: 0 % (ref 0.0–0.2)

## 2023-12-28 LAB — BASIC METABOLIC PANEL WITH GFR
Anion gap: 9 (ref 5–15)
BUN: 22 mg/dL — ABNORMAL HIGH (ref 6–20)
CO2: 23 mmol/L (ref 22–32)
Calcium: 8.6 mg/dL — ABNORMAL LOW (ref 8.9–10.3)
Chloride: 109 mmol/L (ref 98–111)
Creatinine, Ser: 0.77 mg/dL (ref 0.44–1.00)
GFR, Estimated: >60 mL/min (ref >60)
Glucose, Bld: 107 mg/dL — ABNORMAL HIGH (ref 70–99)
Potassium: 3.9 mmol/L (ref 3.5–5.1)
Sodium: 141 mmol/L (ref 135–145)

## 2023-12-28 LAB — PROTIME-INR
INR: 1 (ref 0.8–1.2)
Prothrombin Time: 12.9 s (ref 11.4–15.2)

## 2023-12-28 MED ORDER — MORPHINE SULFATE (PF) 2 MG/ML IV SOLN
2.0000 mg | Freq: Once | INTRAVENOUS | Status: AC
  Administered 2023-12-28: 2 mg via INTRAVENOUS
  Filled 2023-12-28: qty 1

## 2023-12-28 MED ORDER — ACETAMINOPHEN 500 MG PO TABS: 1000.0000 mg | ORAL_TABLET | Freq: Four times a day (QID) | ORAL | 2 refills | Status: DC | PRN

## 2023-12-28 MED ORDER — METOCLOPRAMIDE HCL 5 MG/ML IJ SOLN
10.0000 mg | Freq: Once | INTRAMUSCULAR | Status: AC
  Administered 2023-12-28: 10 mg via INTRAVENOUS
  Filled 2023-12-28: qty 2

## 2023-12-28 MED ORDER — ACETAMINOPHEN 500 MG PO TABS
1000.0000 mg | ORAL_TABLET | Freq: Once | ORAL | Status: AC
  Administered 2023-12-28: 1000 mg via ORAL
  Filled 2023-12-28: qty 2

## 2023-12-28 MED ORDER — METOCLOPRAMIDE HCL 10 MG PO TABS
10.0000 mg | ORAL_TABLET | Freq: Three times a day (TID) | ORAL | 0 refills | Status: DC | PRN
Start: 2023-12-28 — End: 2024-02-17

## 2023-12-28 MED ORDER — LEVETIRACETAM 500 MG PO TABS: 500.0000 mg | ORAL_TABLET | Freq: Two times a day (BID) | ORAL | 0 refills | Status: DC

## 2023-12-28 MED ORDER — METOCLOPRAMIDE HCL 10 MG PO TABS: 10.0000 mg | ORAL_TABLET | Freq: Three times a day (TID) | ORAL | 0 refills | Status: DC | PRN

## 2023-12-28 MED ORDER — LEVETIRACETAM 500 MG PO TABS
500.0000 mg | ORAL_TABLET | Freq: Once | ORAL | Status: AC
  Administered 2023-12-28: 500 mg via ORAL
  Filled 2023-12-28: qty 1

## 2023-12-28 MED ORDER — DIPHENHYDRAMINE HCL 50 MG/ML IJ SOLN
12.5000 mg | Freq: Once | INTRAMUSCULAR | Status: AC
  Administered 2023-12-28: 12.5 mg via INTRAVENOUS
  Filled 2023-12-28: qty 1

## 2023-12-28 MED ORDER — SODIUM CHLORIDE 0.9 % IV BOLUS
500.0000 mL | Freq: Once | INTRAVENOUS | Status: AC
  Administered 2023-12-28: 500 mL via INTRAVENOUS

## 2023-12-28 MED ORDER — MORPHINE SULFATE (PF) 4 MG/ML IV SOLN
4.0000 mg | Freq: Once | INTRAVENOUS | Status: AC
  Administered 2023-12-28: 4 mg via INTRAVENOUS
  Filled 2023-12-28: qty 1

## 2023-12-28 NOTE — Discharge Instructions (Addendum)
 Your evaluation in the emergency department was notable for small amount of bleeding in your brain.  This was discussed with the neurosurgeon, and repeat imaging was stable.  You were cleared for discharge home.  Recommend you follow-up with your primary care provider.  I prescribed you pain and nausea medications to use as needed for any ongoing discomfort.  Return to the emergency department with any new or worsening symptoms.

## 2023-12-28 NOTE — ED Notes (Signed)
 Patient transported to CT

## 2023-12-28 NOTE — ED Notes (Signed)
 Pt given warm blanket & heat in room turned up
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
## 2023-12-29 ENCOUNTER — Ambulatory Visit: Payer: Self-pay

## 2023-12-29 ENCOUNTER — Ambulatory Visit: Payer: Self-pay | Admitting: Family Medicine

## 2023-12-29 NOTE — Telephone Encounter (Signed)
 Office visit has been scheduled for 09/01/23 at 2:50 pm
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
## 2023-12-30 ENCOUNTER — Inpatient Hospital Stay: Payer: MEDICAID | Admitting: Family Medicine

## 2024-01-01 ENCOUNTER — Encounter: Payer: MEDICAID | Admitting: Family Medicine

## 2024-01-01 ENCOUNTER — Telehealth: Payer: Self-pay

## 2024-01-01 ENCOUNTER — Ambulatory Visit (INDEPENDENT_AMBULATORY_CARE_PROVIDER_SITE_OTHER): Payer: MEDICAID | Admitting: Family Medicine

## 2024-01-01 ENCOUNTER — Other Ambulatory Visit: Payer: Self-pay | Admitting: Family Medicine

## 2024-01-01 VITALS — BP 124/89 | HR 100 | Temp 98.3°F | Wt 200.0 lb

## 2024-01-01 DIAGNOSIS — M542 Cervicalgia: Secondary | ICD-10-CM

## 2024-01-01 DIAGNOSIS — S062X0D Diffuse traumatic brain injury without loss of consciousness, subsequent encounter: Secondary | ICD-10-CM

## 2024-01-01 DIAGNOSIS — S062X0A Diffuse traumatic brain injury without loss of consciousness, initial encounter: Secondary | ICD-10-CM | POA: Insufficient documentation

## 2024-01-01 MED ORDER — OXYCODONE-ACETAMINOPHEN 5-325 MG PO TABS: 1.0000 | ORAL_TABLET | Freq: Two times a day (BID) | ORAL | 0 refills | Status: AC | PRN

## 2024-01-01 MED ORDER — OXYCODONE-ACETAMINOPHEN 2.5-325 MG PO TABS: 1.0000 | ORAL_TABLET | Freq: Four times a day (QID) | ORAL | 0 refills | Status: DC | PRN

## 2024-01-01 NOTE — Progress Notes (Unsigned)
This was a nurse visit.

## 2024-01-01 NOTE — Telephone Encounter (Signed)
 Patient's pharmacy-[facility] is asking for a new prescription to be sent with the 5 MG instead of the 2.5 MG please so they could fill it. Thank you.
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
## 2024-01-01 NOTE — Telephone Encounter (Signed)
 Norma [name], can you please resend patient's prescription (oxycodone -acetaminophen  (PERCOCET) 2.5-325 MG tablet ) to [facility] since [facility] didn't have it in stock. Thank you.

## 2024-01-02 NOTE — Telephone Encounter (Signed)
 Called the pharmacy to make sure that the patient would be picking up the correct dosage and I was told that it was the correct dosage after she read it back to to me. The older prescription was cancelled. I then called the patient and had to leave her a voicemail letting her know that the pharmacy had her prescription ready for pick up and if she had any questions, to please call us  back.

## 2024-01-06 ENCOUNTER — Ambulatory Visit: Payer: MEDICAID | Admitting: Podiatry

## 2024-01-06 DIAGNOSIS — T85848A Pain due to other internal prosthetic devices, implants and grafts, initial encounter: Secondary | ICD-10-CM | POA: Diagnosis not present

## 2024-01-06 LAB — DRUG SCREEN 13 W/CONF, WB

## 2024-01-06 MED ORDER — OXYCODONE-ACETAMINOPHEN 5-325 MG PO TABS: 1.0000 | ORAL_TABLET | ORAL | 0 refills | Status: DC | PRN

## 2024-01-06 NOTE — Progress Notes (Signed)
 Subjective:  Patient ID: Norma [name], female    DOB: August 29, 1975,  MRN: [id number]  Chief Complaint  Patient presents with   Foot Pain    Pt stated that she still has some pain  Had recent xrays on 12/28/23    48 y.o. female presents with the above complaint.  Patient presents with complaint of left foot hardware pain.  She states it bothers her when she ambulates.  She wanted to get it evaluated denies seeing anyone else prior to seeing me for this.  She states it hurts with ambulation or shoe pressure she has been dealing with it for quite some time.  She would like to have it removed denies any other acute issues   Review of Systems: Negative except as noted in the HPI. Denies N/V/F/Ch.  Past Medical History:  Diagnosis Date   AKI (acute kidney injury) (HCC) 06/04/2016   Anxiety    Bacteremia 06/04/2016   Choledocholithiasis 03/01/2020   Cholelithiasis 03/02/2020   COPD (chronic obstructive pulmonary disease) (HCC)    Depression    Diarrhea 06/04/2016   GERD (gastroesophageal reflux disease)    History of kidney stones    Hypertension    Hypokalemia 06/04/2016   IBS (irritable bowel syndrome)    Nausea and vomiting 06/04/2016   Positive blood culture 06/04/2016   Rectal fissure 10/04/2015   Sleep apnea    Surgery, elective 09/22/2019    Current Outpatient Medications:    methocarbamol (ROBAXIN) 500 MG tablet, 1 PO QHS, Disp: , Rfl:    oxyCODONE -acetaminophen  (PERCOCET) 5-325 MG tablet, Take 1 tablet by mouth every 4 (four) hours as needed for severe pain (pain score 7-10)., Disp: 30 tablet, Rfl: 0   acetaminophen  (TYLENOL ) 500 MG tablet, Take 2 tablets (1,000 mg total) by mouth every 6 (six) hours as needed., Disp: 100 tablet, Rfl: 2   albuterol  (VENTOLIN  HFA) 108 (90 Base) MCG/ACT inhaler, Inhale 2 puffs into the lungs every 6 (six) hours as needed for wheezing or shortness of breath., Disp: 8 g, Rfl: 2   ARIPiprazole  (ABILIFY ) 2 MG tablet, Take 2 mg by mouth  daily., Disp: , Rfl:    clonazePAM  (KLONOPIN ) 1 MG tablet, Take 1 tablet (1 mg total) by mouth 2 (two) times daily as needed for anxiety., Disp: 60 tablet, Rfl: 1   Eszopiclone 3 MG TABS, Take 3 mg by mouth at bedtime as needed., Disp: , Rfl:    FLUoxetine  (PROZAC ) 40 MG capsule, Take 40 mg by mouth daily., Disp: , Rfl:    fluticasone (FLONASE) 50 MCG/ACT nasal spray, Place 1 spray into both nostrils daily., Disp: , Rfl:    guanFACINE (INTUNIV) 1 MG TB24 ER tablet, Take 1 mg by mouth at bedtime., Disp: , Rfl:    hydrOXYzine  (VISTARIL ) 25 MG capsule, Take 25 mg by mouth 4 (four) times daily as needed., Disp: , Rfl:    ibuprofen  (ADVIL ) 800 MG tablet, TAKE ONE TABLET (800 MG TOTAL) BY MOUTH EVERY SIX HOURS AS NEEDED., Disp: 60 tablet, Rfl: 1   lansoprazole (PREVACID) 30 MG capsule, Take 30 mg by mouth 2 (two) times daily., Disp: , Rfl:    levETIRAcetam  (KEPPRA ) 500 MG tablet, Take 1 tablet (500 mg total) by mouth 2 (two) times daily for 7 days., Disp: 14 tablet, Rfl: 0   lisinopril  (ZESTRIL ) 20 MG tablet, Take 20 mg by mouth daily., Disp: , Rfl:    metoCLOPramide  (REGLAN ) 10 MG tablet, Take 1 tablet (10 mg total) by mouth every 8 (  eight) hours as needed (as needed for nausea, headache)., Disp: 30 tablet, Rfl: 0   nystatin cream (MYCOSTATIN), Apply 1 Application topically as needed., Disp: , Rfl:    oxybutynin  (DITROPAN ) 5 MG tablet, Take 1 tablet (5 mg total) by mouth every 8 (eight) hours as needed for bladder spasms., Disp: 30 tablet, Rfl: 0   oxyCODONE -acetaminophen  (PERCOCET/ROXICET) 5-325 MG tablet, Take 1 tablet by mouth every 12 (twelve) hours as needed for up to 5 days for severe pain (pain score 7-10)., Disp: 10 tablet, Rfl: 0   Rimegepant Sulfate (NURTEC) 75 MG TBDP, Take 1 tablet (75 mg total) by mouth every other day., Disp: 15 tablet, Rfl: 11   SPIRIVA  RESPIMAT 1.25 MCG/ACT AERS, 2 puffs daily, Disp: , Rfl:    SUMAtriptan  (IMITREX ) 50 MG tablet, Take 1 tablet (50 mg total) by mouth every  2 (two) hours as needed for migraine. May repeat in 2 hours if headache persists or recurs., Disp: 10 tablet, Rfl: 3  Social History   Tobacco Use  Smoking Status Former   Current packs/day: 0.00   Average packs/day: 2.0 packs/day for 30.0 years (60.0 ttl pk-yrs)   Types: Cigarettes, E-cigarettes   Start date: 07/15/1992   Quit date: 07/15/2022   Years since quitting: 1.4   Passive exposure: Past  Smokeless Tobacco Never    Allergies  Allergen Reactions   Misc. Sulfonamide Containing Compounds Hives   Sulfa Antibiotics Hives   Objective:  There were no vitals filed for this visit. There is no height or weight on file to calculate BMI. Constitutional Well developed. Well nourished.  Vascular Dorsalis pedis pulses palpable bilaterally. Posterior tibial pulses palpable bilaterally. Capillary refill normal to all digits.  No cyanosis or clubbing noted. Pedal hair growth normal.  Neurologic Normal speech. Oriented to person, place, and time. Epicritic sensation to light touch grossly present bilaterally.  Dermatologic Nails well groomed and normal in appearance. No open wounds. No skin lesions.  Orthopedic: Pain on palpation to the left second metatarsophalangeal joint pain with range of motion of the joint.  No crepitus clinically appreciated.  No signs of arthritis noted.  Unable to clinically appreciate the hardware   Radiographs: 3 views skeletally mature adult left foot: Previous hardware noted appears to be in good position.  While buried within the bone.  No prominent spot noted Assessment:   1. Pain from implanted hardware, initial encounter    Plan:  Patient was evaluated and treated and all questions answered.  Left foot painful orthopedic hardware - All questions or concerns were discussed with the patient in extensive detail given the amount of pain that she is experiencing she would benefit surgical removal of the K wire.  I discussed my preoperative intra postop  plan with the patient in extensive detail she states understanding would like to proceed with surgical removal of the K wire.  She agrees with the plan like to proceed with surgery -Informed surgical risk consent was reviewed and read aloud to the patient.  I reviewed the films.  I have discussed my findings with the patient in great detail.  I have discussed all risks including but not limited to infection, stiffness, scarring, limp, disability, deformity, damage to blood vessels and nerves, numbness, poor healing, need for braces, arthritis, chronic pain, amputation, death.  All benefits and realistic expectations discussed in great detail.  I have made no promises as to the outcome.  I have provided realistic expectations.  I have offered the patient a 2nd  opinion, which they have declined and assured me they preferred to proceed despite the risks   No follow-ups on file.

## 2024-01-08 LAB — PAIN MGT SCRN (14 DRUGS), UR
Amphetamine Scrn, Ur: NEGATIVE ng/mL
BARBITURATE SCREEN URINE: NEGATIVE ng/mL
BENZODIAZEPINE SCREEN, URINE: NEGATIVE ng/mL
Buprenorphine, Urine: NEGATIVE ng/mL
CANNABINOIDS UR QL SCN: NEGATIVE ng/mL
Cocaine (Metab) Scrn, Ur: NEGATIVE ng/mL
Creatinine(Crt), U: 143 mg/dL (ref 20.0–300.0)
Fentanyl, Urine: NEGATIVE pg/mL
Meperidine Screen, Urine: NEGATIVE ng/mL
Methadone Screen, Urine: NEGATIVE ng/mL
OXYCODONE+OXYMORPHONE UR QL SCN: NEGATIVE ng/mL
Opiate Scrn, Ur: NEGATIVE ng/mL
Ph of Urine: 6.3 (ref 4.5–8.9)
Phencyclidine Qn, Ur: NEGATIVE ng/mL
Propoxyphene Scrn, Ur: NEGATIVE ng/mL
Tramadol Screen, Urine: NEGATIVE ng/mL

## 2024-01-08 LAB — SPECIMEN STATUS REPORT

## 2024-01-12 ENCOUNTER — Encounter: Payer: Self-pay | Admitting: Emergency Medicine

## 2024-01-12 ENCOUNTER — Emergency Department: Payer: MEDICAID

## 2024-01-12 ENCOUNTER — Emergency Department
Admission: EM | Admit: 2024-01-12 | Discharge: 2024-01-12 | Disposition: A | Discharge status: Home / Self Care | Payer: MEDICAID | Attending: Emergency Medicine | Admitting: Emergency Medicine

## 2024-01-12 ENCOUNTER — Other Ambulatory Visit: Payer: Self-pay

## 2024-01-12 DIAGNOSIS — J449 Chronic obstructive pulmonary disease, unspecified: Secondary | ICD-10-CM | POA: Insufficient documentation

## 2024-01-12 DIAGNOSIS — I10 Essential (primary) hypertension: Secondary | ICD-10-CM | POA: Insufficient documentation

## 2024-01-12 DIAGNOSIS — S8012XD Contusion of left lower leg, subsequent encounter: Secondary | ICD-10-CM | POA: Diagnosis not present

## 2024-01-12 DIAGNOSIS — R519 Headache, unspecified: Secondary | ICD-10-CM | POA: Diagnosis not present

## 2024-01-12 DIAGNOSIS — S8992XD Unspecified injury of left lower leg, subsequent encounter: Secondary | ICD-10-CM | POA: Diagnosis present

## 2024-01-12 MED ORDER — OXYCODONE HCL 5 MG PO TABS
5.0000 mg | ORAL_TABLET | Freq: Once | ORAL | Status: AC
  Administered 2024-01-12: 5 mg via ORAL
  Filled 2024-01-12: qty 1

## 2024-01-12 MED ORDER — DEXAMETHASONE SODIUM PHOSPHATE 10 MG/ML IJ SOLN
10.0000 mg | Freq: Once | INTRAMUSCULAR | Status: AC
  Administered 2024-01-12: 10 mg via INTRAMUSCULAR
  Filled 2024-01-12: qty 1

## 2024-01-12 MED ORDER — ONDANSETRON 4 MG PO TBDP
4.0000 mg | ORAL_TABLET | Freq: Once | ORAL | Status: AC
  Administered 2024-01-12: 4 mg via ORAL
  Filled 2024-01-12: qty 1

## 2024-01-12 MED ORDER — PREDNISONE 10 MG PO TABS: ORAL_TABLET | ORAL | 0 refills | Status: DC

## 2024-01-12 NOTE — Discharge Instructions (Signed)
 Call make an appointment with your primary care provider for follow-up of your continued headaches and left leg pain.  X-rays today show no fracture or bony injury to your left leg.  Your CT scan of your head actually shows that there is improvement in that the bleeding that was reported on a prior visit has completely resolved.  Tylenol  if needed for headache.  Continue with your regular medications by your primary care provider.  A prescription for prednisone was sent to the pharmacy to take for inflammation for the next 4 days starting tomorrow.
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
## 2024-01-15 ENCOUNTER — Ambulatory Visit: Payer: MEDICAID | Admitting: Family Medicine

## 2024-01-15 ENCOUNTER — Encounter: Payer: Self-pay | Admitting: Family Medicine

## 2024-01-15 VITALS — BP 105/72 | HR 63 | Temp 98.0°F | Resp 18 | Ht 62.0 in | Wt 192.0 lb

## 2024-01-15 DIAGNOSIS — S062XAS Diffuse traumatic brain injury with loss of consciousness status unknown, sequela: Secondary | ICD-10-CM | POA: Diagnosis not present

## 2024-01-15 DIAGNOSIS — M25462 Effusion, left knee: Secondary | ICD-10-CM | POA: Diagnosis not present

## 2024-01-15 DIAGNOSIS — M23307 Other meniscus derangements, unspecified meniscus, left knee: Secondary | ICD-10-CM | POA: Diagnosis not present

## 2024-01-15 DIAGNOSIS — S062X0D Diffuse traumatic brain injury without loss of consciousness, subsequent encounter: Secondary | ICD-10-CM | POA: Diagnosis not present

## 2024-01-15 MED ORDER — OXYCODONE-ACETAMINOPHEN 5-325 MG PO TABS: 1.0000 | ORAL_TABLET | ORAL | 0 refills | Status: DC | PRN

## 2024-01-15 NOTE — Assessment & Plan Note (Signed)
 Gave her Percocet 5/325 #21.  Please make this last week.  Hopefully by then he will stop having a headache.
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
## 2024-01-15 NOTE — Assessment & Plan Note (Addendum)
 Gave her an injection of dexamethasone  4 mg with lidocaine  1% 1 cc in her left knee.  Patient tolerated procedure well.  Will refer to orthopedist.  Concerned she has meniscus injury.  Reports her knee is so painful she does not believe she can stand all day at work.

## 2024-01-19 ENCOUNTER — Other Ambulatory Visit: Payer: Self-pay | Admitting: Family Medicine

## 2024-01-19 DIAGNOSIS — S062XAS Diffuse traumatic brain injury with loss of consciousness status unknown, sequela: Secondary | ICD-10-CM

## 2024-01-19 NOTE — Telephone Encounter (Unsigned)
 Copied from CRM [id number]. Topic: Clinical - Medication Refill >> Jan 19, 2024 12:21 PM Tiffini S wrote: Medication: oxycodone -acetaminophen  (PERCOCET) 2.5-325MG  tablet, patient is requesting 5mg   Has the patient contacted their pharmacy? Yes (Agent: If no, request that the patient contact the pharmacy for the refill. If patient does not wish to contact the pharmacy document the reason why and proceed with request.) (Agent: If yes, when and what did the pharmacy advise?)  This is the patient's preferred pharmacy:  [id number] [place], [place] - [address] AT [facility] OF [address] [place] [address] Phone: [phone] Fax: [phone]  Is this the correct pharmacy for this prescription? Yes If no, delete pharmacy and type the correct one.   Has the prescription been filled recently? Yes  Is the patient out of the medication? Yes, took last table this morning on 01/19/24  Has the patient been seen for an appointment in the last year OR does the patient have an upcoming appointment? Yes  Can we respond through MyChart? No, patient is asking for phone call   Agent: Please be advised that Rx refills may take up to 3 business days. We ask that you follow-up with your pharmacy.

## 2024-01-20 ENCOUNTER — Telehealth: Payer: Self-pay | Admitting: Family Medicine

## 2024-01-20 NOTE — Telephone Encounter (Signed)
 Copied from CRM [id number]. Topic: Clinical - Medication Question >> Jan 20, 2024  1:59 PM Precious C wrote: Reason for CRM: Patient called requesting a higher dosage of her oxyCODONE -acetaminophen  (PERCOCET) 5-325 MG tablet.  She would like to speak with her provider for guidance on how to proceed. Callback number: [phone] - patient stated she is available for a return call at any time.t

## 2024-01-21 ENCOUNTER — Ambulatory Visit: Payer: Self-pay

## 2024-01-21 ENCOUNTER — Telehealth: Payer: Self-pay | Admitting: Podiatry

## 2024-01-21 NOTE — Telephone Encounter (Signed)
 FYI Only or Action Required?: Action required by provider: would like narcotic pain medication.  Patient was last seen in primary care on 01/15/2024 by Ziglar, Susan K, MD.  Called Nurse Triage reporting Toe Injury.  Symptoms began several days ago.  Interventions attempted: OTC medications: tylenol , ibuprofen .  Symptoms are: unchanged.  Triage Disposition: See PCP When Office is Open (Within 3 Days)  Patient/caregiver understands and will follow disposition?: No, wishes to speak with PCP  Pt states she fell a few days ago and broke her toe. Pt states it is the big toe on the L foot. Pt was seen in UC and diagnose with a broken toe. Pt was told to f/u with PCP if she wanted narcotics. Pt states that she needs narcotics. Pt states that pain is 9/10 pain. Pt states there is bruising and swelling. Pt states hurts to walk. Pt states that tylenol  and ibuprofen  do not help.   Copied from CRM [id number]. Topic: Clinical - Red Word Triage >> Jan 21, 2024 12:00 PM Ivette P wrote: Red Word that prompted transfer to Nurse Triage: pt has a broken toe on left foot. Pt is in pain and [facility] and was advised to contact primary Reason for Disposition  [1] After 3 days AND [2] pain not improved  Protocols used: Toe Injury-A-AH

## 2024-01-21 NOTE — Telephone Encounter (Signed)
 Patient has been informed of provider's recommendations. Patient verbalized understanding. All questions and concerns have been addressed.
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
## 2024-01-26 ENCOUNTER — Encounter: Payer: Self-pay | Admitting: Orthopedic Surgery

## 2024-01-26 ENCOUNTER — Ambulatory Visit (INDEPENDENT_AMBULATORY_CARE_PROVIDER_SITE_OTHER): Payer: MEDICAID | Admitting: Orthopedic Surgery

## 2024-01-26 DIAGNOSIS — M25562 Pain in left knee: Secondary | ICD-10-CM | POA: Diagnosis not present

## 2024-01-26 MED ORDER — DICLOFENAC SODIUM 50 MG PO TBEC: 50.0000 mg | DELAYED_RELEASE_TABLET | Freq: Two times a day (BID) | ORAL | 0 refills | Status: DC

## 2024-01-26 NOTE — Patient Instructions (Signed)
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
## 2024-01-29 ENCOUNTER — Telehealth: Payer: Self-pay | Admitting: Podiatry

## 2024-01-29 NOTE — Telephone Encounter (Signed)
 Spoke with patient and informed her we are unable to fill it at this time provider will send next week.

## 2024-01-29 NOTE — Telephone Encounter (Signed)
 Patient called asking for a refill of oxycodone . She would like 5mg .

## 2024-02-01 ENCOUNTER — Emergency Department
Admission: EM | Admit: 2024-02-01 | Discharge: 2024-02-01 | Disposition: A | Discharge status: Home / Self Care | Payer: MEDICAID | Attending: Emergency Medicine | Admitting: Emergency Medicine

## 2024-02-01 ENCOUNTER — Emergency Department: Payer: MEDICAID

## 2024-02-01 ENCOUNTER — Other Ambulatory Visit: Payer: Self-pay

## 2024-02-01 DIAGNOSIS — J449 Chronic obstructive pulmonary disease, unspecified: Secondary | ICD-10-CM | POA: Insufficient documentation

## 2024-02-01 DIAGNOSIS — S065XAA Traumatic subdural hemorrhage with loss of consciousness status unknown, initial encounter: Secondary | ICD-10-CM

## 2024-02-01 DIAGNOSIS — R519 Headache, unspecified: Secondary | ICD-10-CM

## 2024-02-01 DIAGNOSIS — S0990XA Unspecified injury of head, initial encounter: Secondary | ICD-10-CM | POA: Diagnosis present

## 2024-02-01 DIAGNOSIS — S065X0A Traumatic subdural hemorrhage without loss of consciousness, initial encounter: Secondary | ICD-10-CM | POA: Insufficient documentation

## 2024-02-01 LAB — BASIC METABOLIC PANEL WITH GFR
Anion gap: 10 (ref 5–15)
BUN: 16 mg/dL (ref 6–20)
CO2: 23 mmol/L (ref 22–32)
Calcium: 9 mg/dL (ref 8.9–10.3)
Chloride: 106 mmol/L (ref 98–111)
Creatinine, Ser: 0.83 mg/dL (ref 0.44–1.00)
GFR, Estimated: >60 mL/min (ref >60)
Glucose, Bld: 91 mg/dL (ref 70–99)
Potassium: 4.1 mmol/L (ref 3.5–5.1)
Sodium: 139 mmol/L (ref 135–145)

## 2024-02-01 LAB — CBC
HCT: 38.2 % (ref 36.0–46.0)
Hemoglobin: 12.6 g/dL (ref 12.0–15.0)
MCH: 29.1 pg (ref 26.0–34.0)
MCHC: 33 g/dL (ref 30.0–36.0)
MCV: 88.2 fL (ref 80.0–100.0)
Platelets: 263 K/uL (ref 150–400)
RBC: 4.33 MIL/uL (ref 3.87–5.11)
RDW: 14.4 % (ref 11.5–15.5)
WBC: 5.4 K/uL (ref 4.0–10.5)
nRBC: 0 % (ref 0.0–0.2)

## 2024-02-01 MED ORDER — KETOROLAC TROMETHAMINE 30 MG/ML IJ SOLN
30.0000 mg | Freq: Once | INTRAMUSCULAR | Status: AC
  Administered 2024-02-01: 30 mg via INTRAVENOUS
  Filled 2024-02-01: qty 1

## 2024-02-01 MED ORDER — SODIUM CHLORIDE 0.9 % IV BOLUS
1000.0000 mL | Freq: Once | INTRAVENOUS | Status: AC
  Administered 2024-02-01: 1000 mL via INTRAVENOUS

## 2024-02-01 MED ORDER — MORPHINE SULFATE (PF) 4 MG/ML IV SOLN
4.0000 mg | Freq: Once | INTRAVENOUS | Status: AC
  Administered 2024-02-01: 4 mg via INTRAVENOUS
  Filled 2024-02-01: qty 1

## 2024-02-01 MED ORDER — DIPHENHYDRAMINE HCL 50 MG/ML IJ SOLN
50.0000 mg | Freq: Once | INTRAMUSCULAR | Status: AC
  Administered 2024-02-01: 50 mg via INTRAVENOUS
  Filled 2024-02-01: qty 1

## 2024-02-01 MED ORDER — HYDROCODONE-ACETAMINOPHEN 5-325 MG PO TABS: 1.0000 | ORAL_TABLET | ORAL | 0 refills | Status: DC | PRN

## 2024-02-01 MED ORDER — METOCLOPRAMIDE HCL 5 MG/ML IJ SOLN
10.0000 mg | Freq: Once | INTRAMUSCULAR | Status: AC
  Administered 2024-02-01: 10 mg via INTRAVENOUS
  Filled 2024-02-01: qty 2
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
## 2024-02-01 NOTE — ED Notes (Signed)
 Patient transported to CT
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
## 2024-02-01 NOTE — Discharge Instructions (Addendum)
 You should be receiving a phone call from neurosurgery to arrange outpatient follow-up.  If you do not hear from them by Tuesday please call the number provided.  Please take your pain medication as needed but only as prescribed.  Do not drink alcohol or drive while taking pain medication.  Return to the emergency department for any symptom concerning to yourself.
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
## 2024-02-02 ENCOUNTER — Telehealth: Payer: Self-pay | Admitting: Neurosurgery

## 2024-02-02 DIAGNOSIS — S065XAA Traumatic subdural hemorrhage with loss of consciousness status unknown, initial encounter: Secondary | ICD-10-CM

## 2024-02-02 NOTE — Telephone Encounter (Signed)
 Order placed for CT scan to be done in 2 weeks
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
## 2024-02-03 ENCOUNTER — Telehealth: Payer: Self-pay | Admitting: Family Medicine

## 2024-02-03 NOTE — Telephone Encounter (Signed)
 Patient is scheduled for 02/23/2024.

## 2024-02-03 NOTE — Telephone Encounter (Signed)
 Called patient and advised that she should not take any anti-inflammatory medications like Alleve, voltaren , IBUprofen , ETC because she may have to have a procedure on her brain.  Tylenol  is safe.   She asked for klonopin  refill.  Advised that she would have to get that through her psychiatrist.

## 2024-02-04 ENCOUNTER — Telehealth: Payer: Self-pay | Admitting: Podiatry

## 2024-02-04 NOTE — Telephone Encounter (Signed)
Patient calling asking for pain medication refill

## 2024-02-05 ENCOUNTER — Other Ambulatory Visit: Payer: Self-pay | Admitting: Podiatry

## 2024-02-05 ENCOUNTER — Other Ambulatory Visit: Payer: Self-pay | Admitting: Family Medicine

## 2024-02-05 MED ORDER — OXYCODONE-ACETAMINOPHEN 5-325 MG PO TABS: 1.0000 | ORAL_TABLET | ORAL | 0 refills | Status: DC | PRN

## 2024-02-05 NOTE — Telephone Encounter (Signed)
 Copied from CRM [id number]. Topic: Clinical - Medication Refill >> Feb 05, 2024 12:02 PM Carla L wrote: Medication: lisinopril  (ZESTRIL ) 20 MG tablet Patient requesting Dr. Ziglar to send prescription in for her.   Has the patient contacted their pharmacy? Yes Told to the contact the office for further refills.  This is the patient's preferred pharmacy:  [id number] [place], [place] - [address] AT [facility] OF [address] [place] [address] Phone: [phone] Fax: [phone]  Is this the correct pharmacy for this prescription? Yes If no, delete pharmacy and type the correct one.   Has the prescription been filled recently? No  Is the patient out of the medication? No  Has the patient been seen for an appointment in the last year OR does the patient have an upcoming appointment? Yes  Can we respond through MyChart? No  Agent: Please be advised that Rx refills may take up to 3 business days. We ask that you follow-up with your pharmacy.

## 2024-02-06 MED ORDER — LISINOPRIL 20 MG PO TABS: 20.0000 mg | ORAL_TABLET | Freq: Every day | ORAL | 3 refills | Status: DC

## 2024-02-09 ENCOUNTER — Emergency Department: Payer: MEDICAID

## 2024-02-09 ENCOUNTER — Emergency Department
Admission: EM | Admit: 2024-02-09 | Discharge: 2024-02-09 | Disposition: A | Discharge status: Home / Self Care | Payer: MEDICAID | Attending: Emergency Medicine | Admitting: Emergency Medicine

## 2024-02-09 ENCOUNTER — Other Ambulatory Visit: Payer: Self-pay

## 2024-02-09 DIAGNOSIS — J449 Chronic obstructive pulmonary disease, unspecified: Secondary | ICD-10-CM | POA: Insufficient documentation

## 2024-02-09 DIAGNOSIS — R519 Headache, unspecified: Secondary | ICD-10-CM | POA: Insufficient documentation

## 2024-02-09 DIAGNOSIS — S8002XA Contusion of left knee, initial encounter: Secondary | ICD-10-CM | POA: Diagnosis not present

## 2024-02-09 DIAGNOSIS — S8992XA Unspecified injury of left lower leg, initial encounter: Secondary | ICD-10-CM | POA: Diagnosis present

## 2024-02-09 DIAGNOSIS — W19XXXA Unspecified fall, initial encounter: Secondary | ICD-10-CM | POA: Insufficient documentation

## 2024-02-09 DIAGNOSIS — I10 Essential (primary) hypertension: Secondary | ICD-10-CM | POA: Diagnosis not present

## 2024-02-09 DIAGNOSIS — G8929 Other chronic pain: Secondary | ICD-10-CM

## 2024-02-09 DIAGNOSIS — Y92009 Unspecified place in unspecified non-institutional (private) residence as the place of occurrence of the external cause: Secondary | ICD-10-CM | POA: Diagnosis not present

## 2024-02-09 MED ORDER — OXYCODONE HCL 5 MG PO TABS
5.0000 mg | ORAL_TABLET | Freq: Once | ORAL | Status: AC
  Administered 2024-02-09: 5 mg via ORAL
  Filled 2024-02-09: qty 1

## 2024-02-09 MED ORDER — HYDROCODONE-ACETAMINOPHEN 5-325 MG PO TABS: 1.0000 | ORAL_TABLET | Freq: Four times a day (QID) | ORAL | 0 refills | Status: DC | PRN

## 2024-02-09 NOTE — ED Triage Notes (Signed)
 Pt states fall yesterday, pt states HA and L knee. No deformity noted. Pt ambulatory to triage. NAD noted. No LOC noted.

## 2024-02-09 NOTE — ED Provider Notes (Incomplete)
 [facility] Provider Note    Event Date/Time   First MD Initiated Contact with Patient 02/09/24 0818     (approximate)   History   Fall   HPI  Norma [name] is a 48 y.o. female   presents to the ED with complaint of a headache and left knee pain.  Patient states that she fell yesterday at home.  She denies a syncopal episode and states that she is fallen multiple times.      Physical Exam   Triage Vital Signs: ED Triage Vitals  Encounter Vitals Group     BP 02/09/24 0755 (!) 151/100     Girls Systolic BP Percentile --      Girls Diastolic BP Percentile --      Boys Systolic BP Percentile --      Boys Diastolic BP Percentile --      Pulse Rate 02/09/24 0755 92     Resp 02/09/24 0755 18     Temp 02/09/24 0755 97.8 F (36.6 C)     Temp Source 02/09/24 0755 Oral     SpO2 02/09/24 0755 100 %     Weight 02/09/24 0756 189 lb 9.5 oz (86 kg)     Height 02/09/24 0756 5' 2 (1.575 m)     Head Circumference --      Peak Flow --      Pain Score 02/09/24 0756 9     Pain Loc --      Pain Education --      Exclude from Growth Chart --     Most recent vital signs: Vitals:   02/09/24 0755  BP: (!) 151/100  Pulse: 92  Resp: 18  Temp: 97.8 F (36.6 C)  SpO2: 100%     General: Awake, no distress.  Alert, talkative, answers questions appropriately. CV:  Good peripheral perfusion.  Heart regular rate rhythm. Resp:  Normal effort.  Lungs clear bilaterally. Abd:  No distention.  Other:  Left knee without discoloration, abrasions or deformity.  Range of motion is slow and guarded secondary to patient's discomfort.  No shortening or rotation noted.   ED Results / Procedures / Treatments   Labs (all labs ordered are listed, but only abnormal results are displayed) Labs Reviewed - No data to display    RADIOLOGY Left knee x-ray images were reviewed and interpreted by myself independent of the radiologist and was negative for fracture or  dislocation.  Per radiology mild medial compartmental articular space narrowing.   PROCEDURES:  Critical Care performed:   Procedures   MEDICATIONS ORDERED IN ED: Medications  oxyCODONE  (Oxy IR/ROXICODONE ) immediate release tablet 5 mg (5 mg Oral Given 02/09/24 1105)     IMPRESSION / MDM / ASSESSMENT AND PLAN / ED COURSE  I reviewed the triage vital signs and the nursing notes.   Differential diagnosis includes, but is not limited to, ***    {Remember to include, when applicable, any/all of the following data: independent review of imaging independent review of labs (comment specifically on pertinent positives and negatives) review of specific prior hospitalizations, PCP/specialist notes, etc. discuss meds given and prescribed document any discussion with consultants (including hospitalists) any clinical decision tools you used and why (PECARN, NEXUS, etc.) did you consider admitting the patient? document social determinants of health affecting patient's care (homelessness, inability to follow up in a timely fashion, etc) document any pre-existing conditions increasing risk on current visit (e.g. diabetes and HTN increasing danger of high-risk chest  pain/ACS) describes what meds you gave (especially parenteral) and why any other interventions?:1}    Patient's presentation is most consistent with {EM COPA:27473}  FINAL CLINICAL IMPRESSION(S) / ED DIAGNOSES   Final diagnoses:  Contusion of left knee, initial encounter  Chronic nonintractable headache, unspecified headache type  Fall in home, initial encounter     Rx / DC Orders   ED Discharge Orders          Ordered    HYDROcodone -acetaminophen  (NORCO/VICODIN) 5-325 MG tablet  Every 6 hours PRN        02/09/24 1054             Note:  This document was prepared using Dragon voice recognition software and may include unintentional dictation errors.

## 2024-02-09 NOTE — ED Notes (Signed)
 See triage note  Presents with left knee pain and head pain States had a fall about 1 month ago  and the again yesterday  Having pain to left knee   Ambulates well

## 2024-02-09 NOTE — Discharge Instructions (Signed)
 Keep your that is already been scheduled for August 13 for evaluation of your headaches.  Also the name of the orthopedist was listed on your discharge papers for you to also follow-up for any continued knee pain.  Any further pain medication would need to come from your primary care provider until you are able to see the neurosurgeon on August 13.  Ice and elevation for your left knee pain and wear Ace wrap for added support.  Take medication only as directed.  Continue to drink lots of fluids to stay hydrated with the hot weather.

## 2024-02-10 NOTE — ED Provider Notes (Signed)
 [facility] Provider Note    Event Date/Time   First MD Initiated Contact with Patient 02/09/24 0818     (approximate)   History   Fall   HPI  Norma [name] is a 48 y.o. female   presents to the ED with complaint of fall that occurred yesterday.  Patient also complains of a headache but denies any head injury or loss of consciousness.  Patient has history of hypertension, anxiety, migraine, iron deficiency anemia, and COPD.  Patient was also seen in the emergency department and diagnosed with a head injury per patient.  She denies any new symptoms, vision changes, nausea, vomiting or dizziness.        Physical Exam   Triage Vital Signs: ED Triage Vitals  Encounter Vitals Group     BP 02/09/24 0755 (!) 151/100     Girls Systolic BP Percentile --      Girls Diastolic BP Percentile --      Boys Systolic BP Percentile --      Boys Diastolic BP Percentile --      Pulse Rate 02/09/24 0755 92     Resp 02/09/24 0755 18     Temp 02/09/24 0755 97.8 F (36.6 C)     Temp Source 02/09/24 0755 Oral     SpO2 02/09/24 0755 100 %     Weight 02/09/24 0756 189 lb 9.5 oz (86 kg)     Height 02/09/24 0756 5' 2 (1.575 m)     Head Circumference --      Peak Flow --      Pain Score 02/09/24 0756 9     Pain Loc --      Pain Education --      Exclude from Growth Chart --     Most recent vital signs: Vitals:   02/09/24 0755  BP: (!) 151/100  Pulse: 92  Resp: 18  Temp: 97.8 F (36.6 C)  SpO2: 100%     General: Awake, no distress.  Alert, talkative, answers questions appropriately. CV:  Good peripheral perfusion.  Heart regular rate and rhythm. Resp:  Normal effort.  Lungs clear bilaterally. Abd:  No distention.  Other:  PERRLA, EOMI's, cranial nerves II through XII grossly intact.  Speech is normal.  No point tenderness on palpation of the cervical, thoracic or lumbar spine.  On examination of the left knee there is no gross deformity however there is  generalized tenderness on palpation anteriorly without effusion, discoloration or abrasions noted.  Pulses are present distally.  No pain with compression of the hip area.   ED Results / Procedures / Treatments   Labs (all labs ordered are listed, but only abnormal results are displayed) Labs Reviewed - No data to display    RADIOLOGY Left knee x-ray images were reviewed and interpreted by myself independent of the radiologist that was negative for acute bony abnormality or dislocation.  Official radiology report does mention degenerative changes and patient was made aware.    PROCEDURES:  Critical Care performed:   Procedures   MEDICATIONS ORDERED IN ED: Medications  oxyCODONE  (Oxy IR/ROXICODONE ) immediate release tablet 5 mg (5 mg Oral Given 02/09/24 1105)     IMPRESSION / MDM / ASSESSMENT AND PLAN / ED COURSE  I reviewed the triage vital signs and the nursing notes.   Differential diagnosis includes, but is not limited to, chronic headaches, migraines, left knee contusion, strain, fracture, dislocation.  48 year old female presents to the ED with complaint  of left knee pain after a fall that occurred the night before.  X-rays were reassuring and patient was made aware.  Patient was given a prescription for hydrocodone  to take as needed and an Ace wrap to her knee.  She was strongly encouraged to keep her appointment with the neurosurgeon that she states she has scheduled for August 13.  She was also given the name of the orthopedist on-call to follow-up with if she continues to have problems with her knee.      Patient's presentation is most consistent with acute complicated illness / injury requiring diagnostic workup.  FINAL CLINICAL IMPRESSION(S) / ED DIAGNOSES   Final diagnoses:  Contusion of left knee, initial encounter  Chronic nonintractable headache, unspecified headache type  Fall in home, initial encounter     Rx / DC Orders   ED Discharge Orders           Ordered    HYDROcodone -acetaminophen  (NORCO/VICODIN) 5-325 MG tablet  Every 6 hours PRN        02/09/24 1054             Note:  This document was prepared using Dragon voice recognition software and may include unintentional dictation errors.   Saunders Shona CROME, PA-C 02/10/24 1050    Claudene Rover, MD 02/10/24 [phone]

## 2024-02-11 ENCOUNTER — Telehealth: Payer: Self-pay | Admitting: Podiatry

## 2024-02-11 NOTE — Telephone Encounter (Signed)
 DOS- 02/16/2024  REMOVAL FIXATION DEEP KWIRE/SCREW LT- 20680  [employer] EFFECTIVE DATE- 08/16/2023  [employer]- $4 COPAY FOR OUTPATIENT SERVICES  PER FAX RECEIVED FROM [employer], NO PRIOR AUTH IS REQUIRED FOR CPT CODE 79319.  REF# [id number]

## 2024-02-12 ENCOUNTER — Telehealth: Payer: Self-pay | Admitting: Family Medicine

## 2024-02-12 ENCOUNTER — Other Ambulatory Visit: Payer: Self-pay | Admitting: Family Medicine

## 2024-02-12 ENCOUNTER — Emergency Department: Payer: MEDICAID

## 2024-02-12 ENCOUNTER — Telehealth: Payer: Self-pay

## 2024-02-12 ENCOUNTER — Other Ambulatory Visit: Payer: Self-pay

## 2024-02-12 ENCOUNTER — Emergency Department
Admission: EM | Admit: 2024-02-12 | Discharge: 2024-02-12 | Disposition: A | Discharge status: Home / Self Care | Payer: MEDICAID | Attending: Emergency Medicine | Admitting: Emergency Medicine

## 2024-02-12 DIAGNOSIS — H538 Other visual disturbances: Secondary | ICD-10-CM | POA: Insufficient documentation

## 2024-02-12 DIAGNOSIS — M25462 Effusion, left knee: Secondary | ICD-10-CM

## 2024-02-12 DIAGNOSIS — R519 Headache, unspecified: Secondary | ICD-10-CM | POA: Diagnosis present

## 2024-02-12 DIAGNOSIS — J449 Chronic obstructive pulmonary disease, unspecified: Secondary | ICD-10-CM | POA: Insufficient documentation

## 2024-02-12 DIAGNOSIS — S065XAA Traumatic subdural hemorrhage with loss of consciousness status unknown, initial encounter: Secondary | ICD-10-CM

## 2024-02-12 DIAGNOSIS — I10 Essential (primary) hypertension: Secondary | ICD-10-CM | POA: Diagnosis not present

## 2024-02-12 LAB — COMPREHENSIVE METABOLIC PANEL WITH GFR
ALT: 12 U/L (ref 0–44)
AST: 17 U/L (ref 15–41)
Albumin: 3.5 g/dL (ref 3.5–5.0)
Alkaline Phosphatase: 68 U/L (ref 38–126)
Anion gap: 9 (ref 5–15)
BUN: 17 mg/dL (ref 6–20)
CO2: 27 mmol/L (ref 22–32)
Calcium: 9.2 mg/dL (ref 8.9–10.3)
Chloride: 105 mmol/L (ref 98–111)
Creatinine, Ser: 0.53 mg/dL (ref 0.44–1.00)
GFR, Estimated: >60 mL/min (ref >60)
Glucose, Bld: 98 mg/dL (ref 70–99)
Potassium: 4 mmol/L (ref 3.5–5.1)
Sodium: 141 mmol/L (ref 135–145)
Total Bilirubin: 0.7 mg/dL (ref 0.0–1.2)
Total Protein: 6.3 g/dL — ABNORMAL LOW (ref 6.5–8.1)

## 2024-02-12 LAB — CBC
HCT: 38.1 % (ref 36.0–46.0)
Hemoglobin: 12.1 g/dL (ref 12.0–15.0)
MCH: 28.5 pg (ref 26.0–34.0)
MCHC: 31.8 g/dL (ref 30.0–36.0)
MCV: 89.9 fL (ref 80.0–100.0)
Platelets: 290 K/uL (ref 150–400)
RBC: 4.24 MIL/uL (ref 3.87–5.11)
RDW: 14 % (ref 11.5–15.5)
WBC: 5.6 K/uL (ref 4.0–10.5)
nRBC: 0 % (ref 0.0–0.2)

## 2024-02-12 MED ORDER — KETOROLAC TROMETHAMINE 15 MG/ML IJ SOLN
15.0000 mg | Freq: Once | INTRAMUSCULAR | Status: AC
  Administered 2024-02-12: 15 mg via INTRAMUSCULAR
  Filled 2024-02-12: qty 1

## 2024-02-12 MED ORDER — HYDROMORPHONE HCL 1 MG/ML IJ SOLN
0.5000 mg | Freq: Once | INTRAMUSCULAR | Status: AC
  Administered 2024-02-12: 0.5 mg via INTRAVENOUS
  Filled 2024-02-12: qty 0.5

## 2024-02-12 MED ORDER — METOCLOPRAMIDE HCL 5 MG/ML IJ SOLN
10.0000 mg | Freq: Once | INTRAMUSCULAR | Status: AC
  Administered 2024-02-12: 10 mg via INTRAVENOUS
  Filled 2024-02-12: qty 2

## 2024-02-12 MED ORDER — SODIUM CHLORIDE 0.9 % IV BOLUS
1000.0000 mL | Freq: Once | INTRAVENOUS | Status: AC
  Administered 2024-02-12: 1000 mL via INTRAVENOUS

## 2024-02-12 MED ORDER — HYDROCODONE-ACETAMINOPHEN 5-325 MG PO TABS
1.0000 | ORAL_TABLET | Freq: Once | ORAL | Status: AC
  Administered 2024-02-12: 1 via ORAL
  Filled 2024-02-12: qty 1

## 2024-02-12 MED ORDER — DEXAMETHASONE 0.5 MG PO TABS: 0.5000 mg | ORAL_TABLET | Freq: Two times a day (BID) | ORAL | 0 refills | Status: DC

## 2024-02-12 MED ORDER — MORPHINE SULFATE (PF) 4 MG/ML IV SOLN
4.0000 mg | Freq: Once | INTRAVENOUS | Status: AC
  Administered 2024-02-12: 4 mg via INTRAVENOUS
  Filled 2024-02-12: qty 1

## 2024-02-12 MED ORDER — MELOXICAM 15 MG PO TABS: 15.0000 mg | ORAL_TABLET | Freq: Every day | ORAL | 0 refills | Status: DC
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
## 2024-02-12 NOTE — Discharge Instructions (Signed)
 You were evaluated in the ED for a headache and/or head injury. Your evaluation reveal a stable subdural hematoma. You will need to follow up with neurology for further management.   Consider these rest and recovery strategies at home:  - Avoid strenous activity , exercise or heavy lifting for a few days.  -Limit screen time and activities requiring intense focus.  - Gradually return to normal activities as symptoms improve   Take tylenol  as needed.   Apply ice/cold pack to the affected area 10-20 minutes every 2-3 hours for the first 24-48 hours to reduce pain and swelling. Avoid alcohol and caffeine.   Follow up with Dr Claudene with neurosurgery on your scheduled appointment 08/11 for further evaluation.

## 2024-02-12 NOTE — Telephone Encounter (Unsigned)
 Copied from CRM #[id number]. Topic: Clinical - Medication Refill >> Feb 12, 2024 10:51 AM Avram MATSU wrote: Medication: oxyCODONE  (Oxy IR/ROXICODONE ) immediate release tablet 5 mg   Has the patient contacted their pharmacy? Yes (Agent: If no, request that the patient contact the pharmacy for the refill. If patient does not wish to contact the pharmacy document the reason why and proceed with request.) (Agent: If yes, when and what did the pharmacy advise?)  This is the patient's preferred pharmacy:  [facility][id number] [place], [place] - [address] AT [facility] OF [address] [place] [address] Phone: [phone] Fax: [phone]  Is this the correct pharmacy for this prescription? Yes If no, delete pharmacy and type the correct one.   Has the prescription been filled recently? No  Is the patient out of the medication? Yes  Has the patient been seen for an appointment in the last year OR does the patient have an upcoming appointment? Yes  Can we respond through MyChart? No  Agent: Please be advised that Rx refills may take up to 3 business days. We ask that you follow-up with your pharmacy.

## 2024-02-12 NOTE — Telephone Encounter (Signed)
 Copied from CRM [id number]. Topic: Appointments - Appointment Scheduling >> Feb 12, 2024  9:01 AM Berneda FALCON wrote: Patient states she is still having pain in her left knee. So much so that she had a fall on 7/29 and went to [facility] for this. She has a follow up scheduled for 8/5 (Soonest available) and would like to know if we can call her in some pain medication in the meantime.  If so, she would like to use [facility] #[id number] [place], [place] - [address] AT [facility] OF [address] [place] [address] Phone: [phone] Fax: [phone] Hours: Not open 24 hours   She is requesting percocet 5MG  as the previous MG did not help the pain.  Patient callback is [phone]

## 2024-02-16 ENCOUNTER — Ambulatory Visit
Admission: RE | Admit: 2024-02-16 | Discharge: 2024-02-16 | Disposition: A | Discharge status: Home / Self Care | Payer: MEDICAID | Source: Ambulatory Visit | Attending: Neurosurgery | Admitting: Neurosurgery

## 2024-02-16 ENCOUNTER — Other Ambulatory Visit: Payer: Self-pay | Admitting: Podiatry

## 2024-02-16 DIAGNOSIS — Z7951 Long term (current) use of inhaled steroids: Secondary | ICD-10-CM | POA: Diagnosis not present

## 2024-02-16 DIAGNOSIS — I10 Essential (primary) hypertension: Secondary | ICD-10-CM | POA: Diagnosis not present

## 2024-02-16 DIAGNOSIS — G4733 Obstructive sleep apnea (adult) (pediatric): Secondary | ICD-10-CM | POA: Diagnosis not present

## 2024-02-16 DIAGNOSIS — Z6832 Body mass index (BMI) 32.0-32.9, adult: Secondary | ICD-10-CM | POA: Diagnosis not present

## 2024-02-16 DIAGNOSIS — F29 Unspecified psychosis not due to a substance or known physiological condition: Secondary | ICD-10-CM | POA: Diagnosis not present

## 2024-02-16 DIAGNOSIS — R569 Unspecified convulsions: Secondary | ICD-10-CM | POA: Diagnosis present

## 2024-02-16 DIAGNOSIS — K269 Duodenal ulcer, unspecified as acute or chronic, without hemorrhage or perforation: Secondary | ICD-10-CM | POA: Diagnosis not present

## 2024-02-16 DIAGNOSIS — F32A Depression, unspecified: Secondary | ICD-10-CM | POA: Diagnosis not present

## 2024-02-16 DIAGNOSIS — E669 Obesity, unspecified: Secondary | ICD-10-CM | POA: Diagnosis not present

## 2024-02-16 DIAGNOSIS — R000 Tachycardia, unspecified: Secondary | ICD-10-CM | POA: Diagnosis not present

## 2024-02-16 DIAGNOSIS — S065XAA Traumatic subdural hemorrhage with loss of consciousness status unknown, initial encounter: Secondary | ICD-10-CM

## 2024-02-16 DIAGNOSIS — J449 Chronic obstructive pulmonary disease, unspecified: Secondary | ICD-10-CM | POA: Diagnosis not present

## 2024-02-16 DIAGNOSIS — R4182 Altered mental status, unspecified: Secondary | ICD-10-CM | POA: Diagnosis not present

## 2024-02-16 DIAGNOSIS — Z87891 Personal history of nicotine dependence: Secondary | ICD-10-CM | POA: Diagnosis not present

## 2024-02-16 DIAGNOSIS — E876 Hypokalemia: Secondary | ICD-10-CM | POA: Diagnosis not present

## 2024-02-16 DIAGNOSIS — F419 Anxiety disorder, unspecified: Secondary | ICD-10-CM | POA: Diagnosis not present

## 2024-02-16 DIAGNOSIS — Z79899 Other long term (current) drug therapy: Secondary | ICD-10-CM | POA: Diagnosis not present

## 2024-02-16 DIAGNOSIS — S065XAD Traumatic subdural hemorrhage with loss of consciousness status unknown, subsequent encounter: Secondary | ICD-10-CM | POA: Diagnosis not present

## 2024-02-17 ENCOUNTER — Other Ambulatory Visit: Payer: Self-pay

## 2024-02-17 ENCOUNTER — Ambulatory Visit (INDEPENDENT_AMBULATORY_CARE_PROVIDER_SITE_OTHER): Payer: MEDICAID | Admitting: Family Medicine

## 2024-02-17 ENCOUNTER — Observation Stay
Admission: EM | Admit: 2024-02-17 | Discharge: 2024-02-18 | Disposition: A | Discharge status: Home / Self Care | Payer: MEDICAID | Attending: Internal Medicine | Admitting: Internal Medicine

## 2024-02-17 ENCOUNTER — Encounter: Payer: Self-pay | Admitting: Family Medicine

## 2024-02-17 ENCOUNTER — Telehealth: Payer: Self-pay

## 2024-02-17 ENCOUNTER — Emergency Department: Payer: MEDICAID

## 2024-02-17 VITALS — BP 139/96 | HR 114 | Temp 97.9°F | Resp 20 | Ht 62.0 in | Wt 189.0 lb

## 2024-02-17 DIAGNOSIS — K219 Gastro-esophageal reflux disease without esophagitis: Secondary | ICD-10-CM

## 2024-02-17 DIAGNOSIS — S065XAA Traumatic subdural hemorrhage with loss of consciousness status unknown, initial encounter: Secondary | ICD-10-CM | POA: Diagnosis present

## 2024-02-17 DIAGNOSIS — F17290 Nicotine dependence, other tobacco product, uncomplicated: Secondary | ICD-10-CM | POA: Diagnosis present

## 2024-02-17 DIAGNOSIS — Z833 Family history of diabetes mellitus: Secondary | ICD-10-CM

## 2024-02-17 DIAGNOSIS — G4700 Insomnia, unspecified: Secondary | ICD-10-CM | POA: Insufficient documentation

## 2024-02-17 DIAGNOSIS — F29 Unspecified psychosis not due to a substance or known physiological condition: Secondary | ICD-10-CM | POA: Diagnosis present

## 2024-02-17 DIAGNOSIS — Z87891 Personal history of nicotine dependence: Secondary | ICD-10-CM

## 2024-02-17 DIAGNOSIS — E876 Hypokalemia: Secondary | ICD-10-CM | POA: Diagnosis present

## 2024-02-17 DIAGNOSIS — Z8249 Family history of ischemic heart disease and other diseases of the circulatory system: Secondary | ICD-10-CM

## 2024-02-17 DIAGNOSIS — Z9049 Acquired absence of other specified parts of digestive tract: Secondary | ICD-10-CM

## 2024-02-17 DIAGNOSIS — K589 Irritable bowel syndrome without diarrhea: Secondary | ICD-10-CM | POA: Diagnosis present

## 2024-02-17 DIAGNOSIS — F32A Depression, unspecified: Secondary | ICD-10-CM | POA: Diagnosis present

## 2024-02-17 DIAGNOSIS — J42 Unspecified chronic bronchitis: Secondary | ICD-10-CM | POA: Diagnosis not present

## 2024-02-17 DIAGNOSIS — G4733 Obstructive sleep apnea (adult) (pediatric): Secondary | ICD-10-CM | POA: Diagnosis present

## 2024-02-17 DIAGNOSIS — K269 Duodenal ulcer, unspecified as acute or chronic, without hemorrhage or perforation: Secondary | ICD-10-CM | POA: Diagnosis present

## 2024-02-17 DIAGNOSIS — R350 Frequency of micturition: Secondary | ICD-10-CM | POA: Diagnosis not present

## 2024-02-17 DIAGNOSIS — S065XAD Traumatic subdural hemorrhage with loss of consciousness status unknown, subsequent encounter: Secondary | ICD-10-CM

## 2024-02-17 DIAGNOSIS — J410 Simple chronic bronchitis: Secondary | ICD-10-CM

## 2024-02-17 DIAGNOSIS — Z87442 Personal history of urinary calculi: Secondary | ICD-10-CM

## 2024-02-17 DIAGNOSIS — J449 Chronic obstructive pulmonary disease, unspecified: Secondary | ICD-10-CM | POA: Diagnosis present

## 2024-02-17 DIAGNOSIS — Z720 Tobacco use: Secondary | ICD-10-CM

## 2024-02-17 DIAGNOSIS — R569 Unspecified convulsions: Principal | ICD-10-CM | POA: Diagnosis present

## 2024-02-17 DIAGNOSIS — I10 Essential (primary) hypertension: Secondary | ICD-10-CM | POA: Diagnosis present

## 2024-02-17 DIAGNOSIS — F5101 Primary insomnia: Secondary | ICD-10-CM

## 2024-02-17 DIAGNOSIS — M25462 Effusion, left knee: Secondary | ICD-10-CM | POA: Diagnosis not present

## 2024-02-17 DIAGNOSIS — F419 Anxiety disorder, unspecified: Secondary | ICD-10-CM | POA: Diagnosis present

## 2024-02-17 DIAGNOSIS — H9042 Sensorineural hearing loss, unilateral, left ear, with unrestricted hearing on the contralateral side: Secondary | ICD-10-CM

## 2024-02-17 DIAGNOSIS — Z79899 Other long term (current) drug therapy: Secondary | ICD-10-CM

## 2024-02-17 DIAGNOSIS — Z90710 Acquired absence of both cervix and uterus: Secondary | ICD-10-CM

## 2024-02-17 DIAGNOSIS — T7840XD Allergy, unspecified, subsequent encounter: Secondary | ICD-10-CM

## 2024-02-17 DIAGNOSIS — Z791 Long term (current) use of non-steroidal anti-inflammatories (NSAID): Secondary | ICD-10-CM

## 2024-02-17 DIAGNOSIS — Z7951 Long term (current) use of inhaled steroids: Secondary | ICD-10-CM

## 2024-02-17 DIAGNOSIS — Z882 Allergy status to sulfonamides status: Secondary | ICD-10-CM

## 2024-02-17 DIAGNOSIS — B354 Tinea corporis: Secondary | ICD-10-CM

## 2024-02-17 DIAGNOSIS — R4182 Altered mental status, unspecified: Principal | ICD-10-CM | POA: Diagnosis present

## 2024-02-17 DIAGNOSIS — R000 Tachycardia, unspecified: Secondary | ICD-10-CM | POA: Diagnosis present

## 2024-02-17 DIAGNOSIS — H9190 Unspecified hearing loss, unspecified ear: Secondary | ICD-10-CM | POA: Insufficient documentation

## 2024-02-17 DIAGNOSIS — E669 Obesity, unspecified: Secondary | ICD-10-CM | POA: Diagnosis present

## 2024-02-17 DIAGNOSIS — Z825 Family history of asthma and other chronic lower respiratory diseases: Secondary | ICD-10-CM

## 2024-02-17 DIAGNOSIS — Z6832 Body mass index (BMI) 32.0-32.9, adult: Secondary | ICD-10-CM

## 2024-02-17 DIAGNOSIS — G43701 Chronic migraine without aura, not intractable, with status migrainosus: Secondary | ICD-10-CM

## 2024-02-17 DIAGNOSIS — Z8711 Personal history of peptic ulcer disease: Secondary | ICD-10-CM

## 2024-02-17 DIAGNOSIS — F418 Other specified anxiety disorders: Secondary | ICD-10-CM | POA: Diagnosis present

## 2024-02-17 DIAGNOSIS — Z8619 Personal history of other infectious and parasitic diseases: Secondary | ICD-10-CM

## 2024-02-17 LAB — CBC
HCT: 39.4 % (ref 36.0–46.0)
Hemoglobin: 12.7 g/dL (ref 12.0–15.0)
MCH: 28.9 pg (ref 26.0–34.0)
MCHC: 32.2 g/dL (ref 30.0–36.0)
MCV: 89.7 fL (ref 80.0–100.0)
Platelets: 329 K/uL (ref 150–400)
RBC: 4.39 MIL/uL (ref 3.87–5.11)
RDW: 15 % (ref 11.5–15.5)
WBC: 8.1 K/uL (ref 4.0–10.5)
nRBC: 0 % (ref 0.0–0.2)

## 2024-02-17 LAB — COMPREHENSIVE METABOLIC PANEL WITH GFR
ALT: 30 U/L (ref 0–44)
AST: 29 U/L (ref 15–41)
Albumin: 4.1 g/dL (ref 3.5–5.0)
Alkaline Phosphatase: 74 U/L (ref 38–126)
Anion gap: 15 (ref 5–15)
BUN: 19 mg/dL (ref 6–20)
CO2: 22 mmol/L (ref 22–32)
Calcium: 9.3 mg/dL (ref 8.9–10.3)
Chloride: 105 mmol/L (ref 98–111)
Creatinine, Ser: 0.86 mg/dL (ref 0.44–1.00)
GFR, Estimated: >60 mL/min (ref >60)
Glucose, Bld: 98 mg/dL (ref 70–99)
Potassium: 3.2 mmol/L — ABNORMAL LOW (ref 3.5–5.1)
Sodium: 142 mmol/L (ref 135–145)
Total Bilirubin: 0.7 mg/dL (ref 0.0–1.2)
Total Protein: 7.1 g/dL (ref 6.5–8.1)

## 2024-02-17 LAB — URINALYSIS, ROUTINE W REFLEX MICROSCOPIC
Bacteria, UA: NONE SEEN
Bilirubin Urine: NEGATIVE
Glucose, UA: NEGATIVE mg/dL
Hgb urine dipstick: NEGATIVE
Ketones, ur: 20 mg/dL — AB
Leukocytes,Ua: NEGATIVE
Nitrite: NEGATIVE
Protein, ur: 100 mg/dL — AB
Specific Gravity, Urine: 1.033 — ABNORMAL HIGH (ref 1.005–1.030)
pH: 5 (ref 5.0–8.0)

## 2024-02-17 LAB — PROTIME-INR
INR: 1 (ref 0.8–1.2)
Prothrombin Time: 14.3 s (ref 11.4–15.2)

## 2024-02-17 LAB — HCG, QUANTITATIVE, PREGNANCY: hCG, Beta Chain, Quant, S: 3 m[IU]/mL (ref <5)

## 2024-02-17 LAB — APTT: aPTT: 26 s (ref 24–36)

## 2024-02-17 LAB — ETHANOL: Alcohol, Ethyl (B): <15 mg/dL (ref <15)

## 2024-02-17 MED ORDER — SPIRIVA RESPIMAT 1.25 MCG/ACT IN AERS: 1.0000 | INHALATION_SPRAY | Freq: Every day | RESPIRATORY_TRACT | 2 refills | Status: AC

## 2024-02-17 MED ORDER — DM-GUAIFENESIN ER 30-600 MG PO TB12: 1.0000 | ORAL_TABLET | Freq: Two times a day (BID) | ORAL | Status: DC | PRN

## 2024-02-17 MED ORDER — ONDANSETRON HCL 4 MG/2ML IJ SOLN: 4.0000 mg | Freq: Three times a day (TID) | INTRAMUSCULAR | Status: DC | PRN

## 2024-02-17 MED ORDER — HYDRALAZINE HCL 20 MG/ML IJ SOLN: 5.0000 mg | INTRAMUSCULAR | Status: DC | PRN

## 2024-02-17 MED ORDER — LISINOPRIL 20 MG PO TABS: 20.0000 mg | ORAL_TABLET | Freq: Every day | ORAL | 3 refills | Status: DC

## 2024-02-17 MED ORDER — NYSTATIN 100000 UNIT/GM EX CREA: 1.0000 | TOPICAL_CREAM | CUTANEOUS | 2 refills | Status: AC | PRN

## 2024-02-17 MED ORDER — FLUTICASONE PROPIONATE 50 MCG/ACT NA SUSP: 1.0000 | Freq: Every day | NASAL | 3 refills | Status: AC

## 2024-02-17 MED ORDER — AMLODIPINE BESYLATE 5 MG PO TABS: 5.0000 mg | ORAL_TABLET | Freq: Every day | ORAL | 3 refills | Status: DC

## 2024-02-17 MED ORDER — NURTEC 75 MG PO TBDP: 1.0000 | ORAL_TABLET | ORAL | 11 refills | Status: DC

## 2024-02-17 MED ORDER — SUMATRIPTAN SUCCINATE 50 MG PO TABS: 50.0000 mg | ORAL_TABLET | ORAL | 3 refills | Status: DC | PRN

## 2024-02-17 MED ORDER — LEVETIRACETAM (KEPPRA) 500 MG/5 ML ADULT IV PUSH
1000.0000 mg | Freq: Two times a day (BID) | INTRAVENOUS | Status: DC
  Administered 2024-02-18: 1000 mg via INTRAVENOUS
  Filled 2024-02-17: qty 10

## 2024-02-17 MED ORDER — METOCLOPRAMIDE HCL 10 MG PO TABS: 10.0000 mg | ORAL_TABLET | Freq: Three times a day (TID) | ORAL | 0 refills | Status: DC | PRN

## 2024-02-17 MED ORDER — POTASSIUM CHLORIDE CRYS ER 20 MEQ PO TBCR
40.0000 meq | EXTENDED_RELEASE_TABLET | Freq: Once | ORAL | Status: AC
  Administered 2024-02-18: 40 meq via ORAL
  Filled 2024-02-17: qty 2

## 2024-02-17 MED ORDER — LORAZEPAM 2 MG/ML IJ SOLN: 2.0000 mg | INTRAMUSCULAR | Status: DC | PRN

## 2024-02-17 MED ORDER — ALBUTEROL SULFATE HFA 108 (90 BASE) MCG/ACT IN AERS: 2.0000 | INHALATION_SPRAY | Freq: Four times a day (QID) | RESPIRATORY_TRACT | 2 refills | Status: AC | PRN

## 2024-02-17 MED ORDER — ACETAMINOPHEN 500 MG PO TABS: 1000.0000 mg | ORAL_TABLET | Freq: Four times a day (QID) | ORAL | 2 refills | Status: DC | PRN

## 2024-02-17 MED ORDER — ACETAMINOPHEN 325 MG PO TABS: 650.0000 mg | ORAL_TABLET | Freq: Four times a day (QID) | ORAL | Status: DC | PRN

## 2024-02-17 MED ORDER — OXYBUTYNIN CHLORIDE 5 MG PO TABS
5.0000 mg | ORAL_TABLET | Freq: Three times a day (TID) | ORAL | 0 refills | Status: DC | PRN
Start: 2024-02-17 — End: 2024-03-24

## 2024-02-17 MED ORDER — LANSOPRAZOLE 30 MG PO CPDR: 30.0000 mg | DELAYED_RELEASE_CAPSULE | Freq: Two times a day (BID) | ORAL | 2 refills | Status: DC

## 2024-02-17 MED ORDER — MELOXICAM 15 MG PO TABS: 15.0000 mg | ORAL_TABLET | Freq: Every day | ORAL | 0 refills | Status: DC

## 2024-02-17 MED ORDER — ALBUTEROL SULFATE HFA 108 (90 BASE) MCG/ACT IN AERS: 2.0000 | INHALATION_SPRAY | RESPIRATORY_TRACT | Status: DC | PRN

## 2024-02-17 MED ORDER — SODIUM CHLORIDE 0.9 % IV BOLUS
1000.0000 mL | Freq: Once | INTRAVENOUS | Status: AC
  Administered 2024-02-17: 1000 mL via INTRAVENOUS

## 2024-02-17 MED ORDER — FLUTICASONE-SALMETEROL 250-50 MCG/ACT IN AEPB: 1.0000 | INHALATION_SPRAY | Freq: Two times a day (BID) | RESPIRATORY_TRACT | 2 refills | Status: AC

## 2024-02-17 MED ORDER — ALBUTEROL SULFATE (2.5 MG/3ML) 0.083% IN NEBU: 2.5000 mg | INHALATION_SOLUTION | RESPIRATORY_TRACT | Status: DC | PRN

## 2024-02-17 MED ORDER — LEVETIRACETAM (KEPPRA) 500 MG/5 ML ADULT IV PUSH
1000.0000 mg | Freq: Once | INTRAVENOUS | Status: AC
  Administered 2024-02-17: 1000 mg via INTRAVENOUS
  Filled 2024-02-17: qty 10

## 2024-02-17 MED ORDER — SUMATRIPTAN SUCCINATE 50 MG PO TABS: ORAL_TABLET | ORAL | 3 refills | Status: DC

## 2024-02-17 MED ORDER — HYDROCODONE-ACETAMINOPHEN 7.5-325 MG PO TABS: 1.0000 | ORAL_TABLET | Freq: Three times a day (TID) | ORAL | 0 refills | Status: DC | PRN
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
## 2024-02-17 NOTE — Assessment & Plan Note (Signed)
 Reports difficulty sleeping but she has sleeping medications from psychiatry.  Follow-up with psychiatry about this topic.

## 2024-02-17 NOTE — ED Notes (Signed)
 Patient assisted to use restroom. Patient ambulated with steady gait to toilet. Patient urinated and returned to bed. Patient placed back on monitor and bed in lowest position. Significant other at bedside.

## 2024-02-17 NOTE — Telephone Encounter (Signed)
(  KeyBETHA BATTIEST)  PA Case ID #: [id number]   Status Sent to Plan today  Drug HYDROcodone -Acetaminophen  7.5-325MG  tablets  Form [employer] Electronic Prior Authorization Form
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
## 2024-02-17 NOTE — Assessment & Plan Note (Signed)
 Is asking for her Klonopin  to be refilled.  She is under the care of a psychiatrist so they will have to give it to her if they want her to have it.

## 2024-02-17 NOTE — Assessment & Plan Note (Signed)
 She reports she stopped smoking 2 years ago after her mother died of COPD

## 2024-02-17 NOTE — Assessment & Plan Note (Signed)
 Will refer her to ENT for hearing evaluation.

## 2024-02-17 NOTE — Assessment & Plan Note (Signed)
 Had advancement of hematoma on 731 CT of the brain.  CT of the brain from yesterday shows stable hematoma.  Has an appointment with neurology tomorrow.  Refilled pain medication hydrocodone  7.5 #30.   Take 2 a day but if you have to take a third it is okay.

## 2024-02-17 NOTE — ED Notes (Signed)
 Bed rails padded for seizure precautions. Family at bedside.
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
## 2024-02-17 NOTE — Assessment & Plan Note (Signed)
 Blood pressure is not well-controlled despite amlodipine  5 mg and lisinopril  20 mg total.  Patient is agitated and has not slept well.
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
## 2024-02-18 ENCOUNTER — Inpatient Hospital Stay: Payer: MEDICAID

## 2024-02-18 ENCOUNTER — Encounter: Payer: Self-pay | Admitting: Internal Medicine

## 2024-02-18 ENCOUNTER — Ambulatory Visit: Payer: MEDICAID | Admitting: Neuroradiology

## 2024-02-18 DIAGNOSIS — G4733 Obstructive sleep apnea (adult) (pediatric): Secondary | ICD-10-CM | POA: Diagnosis present

## 2024-02-18 DIAGNOSIS — Z7951 Long term (current) use of inhaled steroids: Secondary | ICD-10-CM | POA: Diagnosis not present

## 2024-02-18 DIAGNOSIS — R4182 Altered mental status, unspecified: Secondary | ICD-10-CM

## 2024-02-18 DIAGNOSIS — K269 Duodenal ulcer, unspecified as acute or chronic, without hemorrhage or perforation: Secondary | ICD-10-CM | POA: Diagnosis present

## 2024-02-18 DIAGNOSIS — Z87442 Personal history of urinary calculi: Secondary | ICD-10-CM | POA: Diagnosis not present

## 2024-02-18 DIAGNOSIS — J449 Chronic obstructive pulmonary disease, unspecified: Secondary | ICD-10-CM | POA: Diagnosis present

## 2024-02-18 DIAGNOSIS — F29 Unspecified psychosis not due to a substance or known physiological condition: Secondary | ICD-10-CM | POA: Diagnosis present

## 2024-02-18 DIAGNOSIS — R569 Unspecified convulsions: Secondary | ICD-10-CM | POA: Diagnosis not present

## 2024-02-18 DIAGNOSIS — F32A Depression, unspecified: Secondary | ICD-10-CM | POA: Diagnosis present

## 2024-02-18 DIAGNOSIS — Z833 Family history of diabetes mellitus: Secondary | ICD-10-CM | POA: Diagnosis not present

## 2024-02-18 DIAGNOSIS — R000 Tachycardia, unspecified: Secondary | ICD-10-CM | POA: Diagnosis present

## 2024-02-18 DIAGNOSIS — Z6832 Body mass index (BMI) 32.0-32.9, adult: Secondary | ICD-10-CM | POA: Diagnosis not present

## 2024-02-18 DIAGNOSIS — E876 Hypokalemia: Secondary | ICD-10-CM | POA: Diagnosis present

## 2024-02-18 DIAGNOSIS — I10 Essential (primary) hypertension: Secondary | ICD-10-CM | POA: Diagnosis present

## 2024-02-18 DIAGNOSIS — Z8711 Personal history of peptic ulcer disease: Secondary | ICD-10-CM | POA: Diagnosis not present

## 2024-02-18 DIAGNOSIS — S065XAD Traumatic subdural hemorrhage with loss of consciousness status unknown, subsequent encounter: Secondary | ICD-10-CM

## 2024-02-18 DIAGNOSIS — S065XAS Traumatic subdural hemorrhage with loss of consciousness status unknown, sequela: Secondary | ICD-10-CM | POA: Diagnosis not present

## 2024-02-18 DIAGNOSIS — F17290 Nicotine dependence, other tobacco product, uncomplicated: Secondary | ICD-10-CM | POA: Diagnosis present

## 2024-02-18 DIAGNOSIS — Z882 Allergy status to sulfonamides status: Secondary | ICD-10-CM | POA: Diagnosis not present

## 2024-02-18 DIAGNOSIS — K219 Gastro-esophageal reflux disease without esophagitis: Secondary | ICD-10-CM | POA: Diagnosis present

## 2024-02-18 DIAGNOSIS — Z79899 Other long term (current) drug therapy: Secondary | ICD-10-CM | POA: Diagnosis not present

## 2024-02-18 DIAGNOSIS — S065XAA Traumatic subdural hemorrhage with loss of consciousness status unknown, initial encounter: Secondary | ICD-10-CM | POA: Diagnosis present

## 2024-02-18 DIAGNOSIS — Z825 Family history of asthma and other chronic lower respiratory diseases: Secondary | ICD-10-CM | POA: Diagnosis not present

## 2024-02-18 DIAGNOSIS — Z8249 Family history of ischemic heart disease and other diseases of the circulatory system: Secondary | ICD-10-CM | POA: Diagnosis not present

## 2024-02-18 DIAGNOSIS — E669 Obesity, unspecified: Secondary | ICD-10-CM | POA: Diagnosis present

## 2024-02-18 DIAGNOSIS — F419 Anxiety disorder, unspecified: Secondary | ICD-10-CM | POA: Diagnosis present

## 2024-02-18 LAB — BASIC METABOLIC PANEL WITH GFR
Anion gap: 9 (ref 5–15)
BUN: 16 mg/dL (ref 6–20)
CO2: 21 mmol/L — ABNORMAL LOW (ref 22–32)
Calcium: 8.6 mg/dL — ABNORMAL LOW (ref 8.9–10.3)
Chloride: 111 mmol/L (ref 98–111)
Creatinine, Ser: 0.75 mg/dL (ref 0.44–1.00)
GFR, Estimated: >60 mL/min (ref >60)
Glucose, Bld: 96 mg/dL (ref 70–99)
Potassium: 3.1 mmol/L — ABNORMAL LOW (ref 3.5–5.1)
Sodium: 141 mmol/L (ref 135–145)

## 2024-02-18 LAB — HIV ANTIBODY (ROUTINE TESTING W REFLEX): HIV Screen 4th Generation wRfx: NONREACTIVE

## 2024-02-18 LAB — MAGNESIUM: Magnesium: 1.7 mg/dL (ref 1.7–2.4)

## 2024-02-18 LAB — PHOSPHORUS: Phosphorus: 2.7 mg/dL (ref 2.5–4.6)

## 2024-02-18 MED ORDER — GUANFACINE HCL ER 1 MG PO TB24: 1.0000 mg | ORAL_TABLET | Freq: Every day | ORAL | Status: DC

## 2024-02-18 MED ORDER — RIMEGEPANT SULFATE 75 MG PO TBDP: 1.0000 | ORAL_TABLET | Freq: Every day | ORAL | Status: DC | PRN

## 2024-02-18 MED ORDER — PANTOPRAZOLE SODIUM 40 MG PO TBEC
40.0000 mg | DELAYED_RELEASE_TABLET | Freq: Every day | ORAL | Status: DC
  Administered 2024-02-18: 40 mg via ORAL
  Filled 2024-02-18: qty 1

## 2024-02-18 MED ORDER — VALPROIC ACID 250 MG PO CAPS
500.0000 mg | ORAL_CAPSULE | Freq: Three times a day (TID) | ORAL | Status: DC
  Filled 2024-02-18: qty 2

## 2024-02-18 MED ORDER — VALPROIC ACID 250 MG PO CAPS: 500.0000 mg | ORAL_CAPSULE | Freq: Three times a day (TID) | ORAL | 1 refills | Status: DC

## 2024-02-18 MED ORDER — MAGNESIUM SULFATE 2 GM/50ML IV SOLN
2.0000 g | Freq: Once | INTRAVENOUS | Status: AC
  Administered 2024-02-18: 2 g via INTRAVENOUS
  Filled 2024-02-18: qty 50

## 2024-02-18 MED ORDER — HYDROXYZINE HCL 25 MG PO TABS
25.0000 mg | ORAL_TABLET | Freq: Four times a day (QID) | ORAL | Status: DC | PRN
  Filled 2024-02-18: qty 1

## 2024-02-18 MED ORDER — FLUOXETINE HCL 20 MG PO CAPS
40.0000 mg | ORAL_CAPSULE | Freq: Every day | ORAL | Status: DC
  Administered 2024-02-18: 40 mg via ORAL
  Filled 2024-02-18: qty 2

## 2024-02-18 MED ORDER — DEXAMETHASONE 0.5 MG PO TABS: 0.5000 mg | ORAL_TABLET | Freq: Two times a day (BID) | ORAL | 0 refills | Status: AC

## 2024-02-18 MED ORDER — VALPROATE SODIUM 100 MG/ML IV SOLN
1600.0000 mg | Freq: Once | INTRAVENOUS | Status: AC
  Administered 2024-02-18: 1600 mg via INTRAVENOUS
  Filled 2024-02-18: qty 16

## 2024-02-18 MED ORDER — TIOTROPIUM BROMIDE MONOHYDRATE 1.25 MCG/ACT IN AERS: 1.0000 | INHALATION_SPRAY | Freq: Every day | RESPIRATORY_TRACT | Status: DC

## 2024-02-18 MED ORDER — ARIPIPRAZOLE 2 MG PO TABS: 2.0000 mg | ORAL_TABLET | Freq: Every day | ORAL | Status: DC

## 2024-02-18 MED ORDER — LISINOPRIL 10 MG PO TABS
20.0000 mg | ORAL_TABLET | Freq: Every day | ORAL | Status: DC
  Administered 2024-02-18: 20 mg via ORAL
  Filled 2024-02-18: qty 2

## 2024-02-18 MED ORDER — BUDESON-GLYCOPYRROL-FORMOTEROL 160-9-4.8 MCG/ACT IN AERO
2.0000 | INHALATION_SPRAY | Freq: Two times a day (BID) | RESPIRATORY_TRACT | Status: DC
  Filled 2024-02-18: qty 5.9

## 2024-02-18 MED ORDER — ZOLPIDEM TARTRATE 5 MG PO TABS: 5.0000 mg | ORAL_TABLET | Freq: Every evening | ORAL | Status: DC | PRN

## 2024-02-18 MED ORDER — POTASSIUM CHLORIDE CRYS ER 20 MEQ PO TBCR
40.0000 meq | EXTENDED_RELEASE_TABLET | Freq: Once | ORAL | Status: AC
  Administered 2024-02-18: 40 meq via ORAL
  Filled 2024-02-18: qty 2

## 2024-02-18 MED ORDER — FLUTICASONE FUROATE-VILANTEROL 200-25 MCG/ACT IN AEPB: 1.0000 | INHALATION_SPRAY | Freq: Every day | RESPIRATORY_TRACT | Status: DC

## 2024-02-18 MED ORDER — AMLODIPINE BESYLATE 5 MG PO TABS
5.0000 mg | ORAL_TABLET | Freq: Every day | ORAL | Status: DC
  Administered 2024-02-18: 5 mg via ORAL
  Filled 2024-02-18: qty 1

## 2024-02-18 NOTE — ED Notes (Signed)
 Pt appears relaxed in bed, smiling.

## 2024-02-18 NOTE — ED Notes (Addendum)
 PT at bedside.

## 2024-02-18 NOTE — ED Notes (Signed)
 Pt verbalized understanding of discharge instructions. Opportunity for questions provided.
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
## 2024-02-18 NOTE — ED Notes (Signed)
 Pt resting in bed comfortably at this time. Morning labs obtained. Family member at bedside provided recliner, pillow, and warm blanket. Pt call light remains within reach. Bed in lowest position with wheels locked. Side rails up with seizure pads in place.

## 2024-02-18 NOTE — ED Notes (Signed)
 CCMD called at this time to transfer pt from rm 14 to rm 35.
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
## 2024-02-18 NOTE — ED Notes (Addendum)
 Pt remains intermittently agitated/irritable.  Pt requesting pain medication and has refused the PRN Tylenol  twice.  Hospitalist made aware. Also, Pt refused Atarax  for anxiety.    Pt currently undergoing an EEG so Ativan  cannot be administered.
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
## 2024-02-18 NOTE — ED Notes (Signed)
 Pt continued to have a flat affect during discharge teachings, but was alert and oriented.  Pt's significant other verbalized understanding of teachings, new prescriptions, and follow-up.  Pt had no difficulty getting out of bed and into the wheelchair.  Pt's significant other verbalized understanding of return precautions.
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
## 2024-02-18 NOTE — Progress Notes (Signed)
 Eeg done
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
## 2024-02-18 NOTE — ED Notes (Signed)
Pharmacy Tech at bedside  

## 2024-02-19 ENCOUNTER — Encounter (HOSPITAL_BASED_OUTPATIENT_CLINIC_OR_DEPARTMENT_OTHER): Payer: Self-pay

## 2024-02-23 ENCOUNTER — Encounter (INDEPENDENT_AMBULATORY_CARE_PROVIDER_SITE_OTHER): Payer: Self-pay

## 2024-02-23 ENCOUNTER — Ambulatory Visit: Payer: MEDICAID | Admitting: Physician Assistant

## 2024-02-24 ENCOUNTER — Encounter: Payer: MEDICAID | Admitting: Podiatry

## 2024-03-01 ENCOUNTER — Encounter: Payer: Self-pay | Admitting: Podiatry

## 2024-03-01 ENCOUNTER — Ambulatory Visit: Payer: MEDICAID | Admitting: Family Medicine

## 2024-03-01 ENCOUNTER — Ambulatory Visit: Payer: MEDICAID | Admitting: Physician Assistant

## 2024-03-09 ENCOUNTER — Encounter: Payer: MEDICAID | Admitting: Podiatry

## 2024-03-09 ENCOUNTER — Encounter: Payer: Self-pay | Admitting: Family Medicine

## 2024-03-09 ENCOUNTER — Ambulatory Visit (INDEPENDENT_AMBULATORY_CARE_PROVIDER_SITE_OTHER): Payer: MEDICAID | Admitting: Family Medicine

## 2024-03-09 VITALS — BP 121/87 | HR 93 | Temp 97.8°F | Resp 18 | Ht 62.0 in | Wt <= 1120 oz

## 2024-03-09 DIAGNOSIS — R300 Dysuria: Secondary | ICD-10-CM | POA: Diagnosis not present

## 2024-03-09 DIAGNOSIS — I10 Essential (primary) hypertension: Secondary | ICD-10-CM

## 2024-03-09 DIAGNOSIS — G43809 Other migraine, not intractable, without status migrainosus: Secondary | ICD-10-CM

## 2024-03-09 DIAGNOSIS — G40909 Epilepsy, unspecified, not intractable, without status epilepticus: Secondary | ICD-10-CM

## 2024-03-09 DIAGNOSIS — M25462 Effusion, left knee: Secondary | ICD-10-CM

## 2024-03-09 DIAGNOSIS — R569 Unspecified convulsions: Secondary | ICD-10-CM

## 2024-03-09 LAB — POCT URINALYSIS DIP (CLINITEK)
Blood, UA: NEGATIVE
Glucose, UA: NEGATIVE mg/dL
Ketones, POC UA: NEGATIVE mg/dL
Leukocytes, UA: NEGATIVE
Nitrite, UA: NEGATIVE
POC PROTEIN,UA: 30 — AB
Spec Grav, UA: 1.025 (ref 1.010–1.025)
Urobilinogen, UA: 4 U/dL — AB
pH, UA: 6 (ref 5.0–8.0)

## 2024-03-09 MED ORDER — LEVETIRACETAM 500 MG PO TABS: 500.0000 mg | ORAL_TABLET | Freq: Two times a day (BID) | ORAL | 1 refills | Status: DC

## 2024-03-09 MED ORDER — AMLODIPINE BESYLATE 5 MG PO TABS
5.0000 mg | ORAL_TABLET | Freq: Every day | ORAL | 1 refills | Status: AC
Start: 2024-03-09 — End: ?

## 2024-03-09 MED ORDER — HYDROCODONE-ACETAMINOPHEN 7.5-325 MG PO TABS: ORAL_TABLET | ORAL | 0 refills | Status: DC

## 2024-03-09 MED ORDER — LISINOPRIL 20 MG PO TABS: 20.0000 mg | ORAL_TABLET | Freq: Every day | ORAL | 1 refills | Status: DC

## 2024-03-11 DIAGNOSIS — R300 Dysuria: Secondary | ICD-10-CM | POA: Insufficient documentation
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
## 2024-03-11 NOTE — Assessment & Plan Note (Signed)
 Had a tonic-clonic seizure with a long postictal state.  She is now on Keppra  500 mg 3 times a day.  She does have a follow-up with neurosurgery.
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
## 2024-03-11 NOTE — Assessment & Plan Note (Signed)
 Urine sample was negative for nitrate and leukocytes.  Concentrated though with a specific gravity of 1.025.  May be that your urine is concentrated and irritating your bladder.  Please drink some water  and see if this improves your symptoms.

## 2024-03-16 ENCOUNTER — Telehealth: Payer: Self-pay

## 2024-03-16 ENCOUNTER — Encounter (HOSPITAL_BASED_OUTPATIENT_CLINIC_OR_DEPARTMENT_OTHER): Payer: Self-pay

## 2024-03-16 DIAGNOSIS — F418 Other specified anxiety disorders: Secondary | ICD-10-CM

## 2024-03-16 MED ORDER — FLUOXETINE HCL 40 MG PO CAPS: 40.0000 mg | ORAL_CAPSULE | Freq: Every day | ORAL | 0 refills | Status: AC

## 2024-03-16 NOTE — Telephone Encounter (Signed)
 Copied from CRM #[id number]. Topic: Clinical - Prescription Issue >> Mar 16, 2024  4:20 PM Ivette P wrote: Reason for CRM: Pharmacy called in to see about getting all pts medicaiton sent to them, having a hard time contacting other pharmacies/    Medication question: FLUoxetine  (PROZAC ) 40 MG capsule And a diabetes medication.   Callback [phone]  Fax [phone]

## 2024-03-16 NOTE — Telephone Encounter (Signed)
 Patient will have to contact office to have medications transferred to [facility] pharmacy.
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
## 2024-03-22 ENCOUNTER — Other Ambulatory Visit: Payer: Self-pay | Admitting: Family Medicine

## 2024-03-22 ENCOUNTER — Telehealth: Payer: Self-pay | Admitting: Podiatry

## 2024-03-22 DIAGNOSIS — G43809 Other migraine, not intractable, without status migrainosus: Secondary | ICD-10-CM

## 2024-03-22 NOTE — Telephone Encounter (Signed)
 Pt called to reschedule her surgery and I told pt that I would have to ask Dr Tobie but he was in surgery today.  Upon checking I did see a letter of her being discharged from practice and notified pt that she should have received the letter and she would need to find a new office to treat her.

## 2024-03-23 ENCOUNTER — Encounter: Payer: MEDICAID | Admitting: Podiatry

## 2024-03-24 ENCOUNTER — Ambulatory Visit (INDEPENDENT_AMBULATORY_CARE_PROVIDER_SITE_OTHER): Payer: MEDICAID | Admitting: Physician Assistant

## 2024-03-24 ENCOUNTER — Encounter: Payer: Self-pay | Admitting: Physician Assistant

## 2024-03-24 VITALS — BP 100/72 | Ht 62.0 in | Wt 196.1 lb

## 2024-03-24 DIAGNOSIS — R569 Unspecified convulsions: Secondary | ICD-10-CM | POA: Diagnosis not present

## 2024-03-24 DIAGNOSIS — M25562 Pain in left knee: Secondary | ICD-10-CM | POA: Diagnosis not present

## 2024-03-24 DIAGNOSIS — S065XAD Traumatic subdural hemorrhage with loss of consciousness status unknown, subsequent encounter: Secondary | ICD-10-CM | POA: Diagnosis not present

## 2024-03-24 DIAGNOSIS — R519 Headache, unspecified: Secondary | ICD-10-CM | POA: Diagnosis not present

## 2024-03-24 DIAGNOSIS — S065XAA Traumatic subdural hemorrhage with loss of consciousness status unknown, initial encounter: Secondary | ICD-10-CM

## 2024-03-24 DIAGNOSIS — G43809 Other migraine, not intractable, without status migrainosus: Secondary | ICD-10-CM

## 2024-03-24 MED ORDER — HYDROCODONE-ACETAMINOPHEN 7.5-325 MG PO TABS: ORAL_TABLET | ORAL | 0 refills | Status: DC

## 2024-03-29 ENCOUNTER — Encounter: Payer: Self-pay | Admitting: Physician Assistant

## 2024-03-30 ENCOUNTER — Ambulatory Visit (INDEPENDENT_AMBULATORY_CARE_PROVIDER_SITE_OTHER): Payer: MEDICAID | Admitting: Family Medicine

## 2024-03-30 ENCOUNTER — Ambulatory Visit: Payer: MEDICAID | Admitting: Family Medicine

## 2024-03-30 ENCOUNTER — Encounter: Payer: Self-pay | Admitting: Family Medicine

## 2024-03-30 DIAGNOSIS — K219 Gastro-esophageal reflux disease without esophagitis: Secondary | ICD-10-CM | POA: Diagnosis not present

## 2024-03-30 DIAGNOSIS — G43809 Other migraine, not intractable, without status migrainosus: Secondary | ICD-10-CM | POA: Diagnosis not present

## 2024-03-30 MED ORDER — HYDROCODONE-ACETAMINOPHEN 7.5-325 MG PO TABS: ORAL_TABLET | ORAL | 0 refills | Status: DC

## 2024-03-30 MED ORDER — LANSOPRAZOLE 30 MG PO CPDR: 30.0000 mg | DELAYED_RELEASE_CAPSULE | Freq: Two times a day (BID) | ORAL | 3 refills | Status: DC
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
## 2024-03-31 ENCOUNTER — Ambulatory Visit: Payer: MEDICAID

## 2024-04-02 ENCOUNTER — Other Ambulatory Visit: Payer: MEDICAID

## 2024-04-05 ENCOUNTER — Ambulatory Visit
Admission: RE | Admit: 2024-04-05 | Discharge: 2024-04-05 | Disposition: A | Discharge status: Home / Self Care | Payer: MEDICAID | Source: Ambulatory Visit | Attending: Physician Assistant | Admitting: Physician Assistant

## 2024-04-05 ENCOUNTER — Telehealth: Payer: Self-pay

## 2024-04-05 DIAGNOSIS — S065XAA Traumatic subdural hemorrhage with loss of consciousness status unknown, initial encounter: Secondary | ICD-10-CM

## 2024-04-05 NOTE — Telephone Encounter (Unsigned)
 Copied from CRM #[id number]. Topic: Clinical - Medication Question >> Apr 05, 2024  2:49 PM Winona R wrote: Pt would like to know if the medication HYDROcodone -acetaminophen  (NORCO) 7.5-325 MG tablet  can be filled by the 26th of September as she is getting married and going out of town on the 27th for about 10- 15 days. Pt also mentioned a smaller supply if possible

## 2024-04-05 NOTE — Telephone Encounter (Signed)
 Copied from CRM #[id number]. Topic: Clinical - Medication Question >> Apr 05, 2024  9:08 AM Norma [name] wrote: Reason for CRM: Patient would like to know if she could have medication HYDROcodone -acetaminophen  (NORCO) 7.5-325 MG tablet signed off on for her to receive before 04/14/2024? Patient stated that she will be out of town on 04/14/2024,and would like to know if she could receive it earlier so that she wont go with out?

## 2024-04-08 ENCOUNTER — Ambulatory Visit: Payer: Self-pay | Admitting: Physician Assistant

## 2024-04-19 ENCOUNTER — Telehealth: Payer: Self-pay

## 2024-04-19 NOTE — Telephone Encounter (Signed)
 Copied from CRM #[id number]. Topic: Clinical - Prescription Issue >> Apr 19, 2024 12:07 PM Pinkey ORN wrote: Reason for CRM: Attention Ziglar, Devere POUR, MD >> Apr 19, 2024 12:09 PM Pinkey ORN wrote: [facility] [phone]  Calling on behalf of patient, wanting to let Ziglar, Susan K, MD know that patient is receiving narcotic pain medications from other providers.

## 2024-04-19 NOTE — Telephone Encounter (Signed)
 Copied from CRM #[id number]. Topic: Clinical - Medication Question >> Apr 19, 2024 12:00 PM Montie POUR wrote: Reason for CRM:  Rosina with Pharmacy is calling to let Dr. Onita know that several doctors are prescribing [facility] narcotic pain medications. Please have a nurse or Dr. Ziglar to call and discuss with pharmacy her medications. Ashley's number is [phone]. I got disconnect from call and tried to call back.

## 2024-04-20 ENCOUNTER — Other Ambulatory Visit: Payer: Self-pay

## 2024-04-20 ENCOUNTER — Telehealth: Payer: Self-pay

## 2024-04-20 DIAGNOSIS — K219 Gastro-esophageal reflux disease without esophagitis: Secondary | ICD-10-CM

## 2024-04-20 MED ORDER — LANSOPRAZOLE 30 MG PO CPDR
30.0000 mg | DELAYED_RELEASE_CAPSULE | Freq: Every day | ORAL | 1 refills | Status: DC
Start: 2024-04-20 — End: 2024-04-27

## 2024-04-20 MED ORDER — LANSOPRAZOLE 30 MG PO CPDR: 30.0000 mg | DELAYED_RELEASE_CAPSULE | Freq: Every day | ORAL | 1 refills | Status: DC

## 2024-04-20 MED ORDER — LANSOPRAZOLE 30 MG PO CPDR: 30.0000 mg | DELAYED_RELEASE_CAPSULE | Freq: Two times a day (BID) | ORAL | 3 refills | Status: DC

## 2024-04-20 NOTE — Telephone Encounter (Signed)
 Prevacid  sent to [facility] pharmacy

## 2024-04-20 NOTE — Addendum Note (Signed)
 Addended by: RAYANN REXENE HERO on: 04/20/2024 09:42 AM   Modules accepted: Orders

## 2024-04-20 NOTE — Telephone Encounter (Signed)
 Copied from CRM #[id number]. Topic: Clinical - Medication Question >> Apr 20, 2024  9:19 AM Tonda B wrote: Reason for CRM: patient is calling in has questions about  lansoprazole  (PREVACID ) 30 MG capsule please call pt [phone] (M

## 2024-04-26 ENCOUNTER — Other Ambulatory Visit: Payer: Self-pay | Admitting: Family Medicine

## 2024-04-26 ENCOUNTER — Other Ambulatory Visit: Payer: Self-pay

## 2024-04-26 ENCOUNTER — Ambulatory Visit: Payer: Self-pay

## 2024-04-26 DIAGNOSIS — G43809 Other migraine, not intractable, without status migrainosus: Secondary | ICD-10-CM

## 2024-04-26 NOTE — Addendum Note (Signed)
 Addended by: RAYANN REXENE HERO on: 04/26/2024 01:22 PM   Modules accepted: Orders

## 2024-04-26 NOTE — Telephone Encounter (Unsigned)
 Copied from CRM [id number]. Topic: Clinical - Medication Refill >> Apr 26, 2024 11:42 AM Mia F wrote: Medication: HYDROcodone -acetaminophen  (NORCO) 7.5-325 MG tablet   Has the patient contacted their pharmacy? No (Agent: If no, request that the patient contact the pharmacy for the refill. If patient does not wish to contact the pharmacy document the reason why and proceed with request.) (Agent: If yes, when and what did the pharmacy advise?)  This is the patient's preferred pharmacy:  [facility] - [place], [place] - [address] [place] [address] Phone: [phone] Fax: [phone]  Is this the correct pharmacy for this prescription? Yes If no, delete pharmacy and type the correct one.   Has the prescription been filled recently? Yes  Is the patient out of the medication? Yes  Has the patient been seen for an appointment in the last year OR does the patient have an upcoming appointment? Yes  Can we respond through MyChart? No  Agent: Please be advised that Rx refills may take up to 3 business days. We ask that you follow-up with your pharmacy.

## 2024-04-26 NOTE — Telephone Encounter (Signed)
 FYI Only or Action Required?: Action required by provider: medication refill request and clinical question for provider.  Patient was last seen in primary care on 03/30/2024 by Ziglar, Susan K, MD.  Called Nurse Triage reporting Knee Pain.  Symptoms began about a month ago.  Interventions attempted: Nothing.  Symptoms are: unchanged.  Triage Disposition: See PCP When Office is Open (Within 3 Days)  Patient/caregiver understands and will follow disposition?: No, wishes to speak with PCP  Copied from CRM #[id number]. Topic: Clinical - Red Word Triage >> Apr 26, 2024 11:44 AM Mia F wrote: Red Word that prompted transfer to Nurse Triage: Knee pain that is worsening. There is some swelling. Already seen Dr Ziglar for it but pain seems to be worsening. Out of pain meds Reason for Disposition  [1] MODERATE pain (e.g., interferes with normal activities, limping) AND [2] present > 3 days  Answer Assessment - Initial Assessment Questions Patient already has appt scheduled 04/27/24, tomorrow. Patient reports out of hydrocodone , reports not effective and requesting alternative pain medication and Call Back.  Advised ED if symptoms worsen. 1. LOCATION and RADIATION: Where is the pain located?      Left knee, radiates to ankle 2. QUALITY: What does the pain feel like?  (e.g., sharp, dull, aching, burning)     aching 3. SEVERITY: How bad is the pain? What does it keep you from doing?   (Scale 1-10; or mild, moderate, severe)     9/10; hydrocodone  does not help, out of med also 4. ONSET: When did the pain start? Does it come and go, or is it there all the time?     Constant; couple months ago, already seen provider 7. AGGRAVATING FACTORS: What makes the knee pain worse? (e.g., walking, climbing stairs, running)     Standing and walking 8. ASSOCIATED SYMPTOMS: Is there any swelling or redness of the knee?     no 9. OTHER SYMPTOMS: Do you have any other symptoms? (e.g., calf pain,  chest pain, difficulty breathing, fever)     no  Protocols used: Knee Pain-A-AH

## 2024-04-26 NOTE — Telephone Encounter (Signed)
 Copied from CRM [id number]. Topic: Clinical - Medication Question >> Apr 26, 2024 12:07 PM Shanda MATSU wrote: Reason for CRM: Rosina w/[facility], CB: [phone] called in to report that patient is req a refill on med, HYDROcodone -acetaminophen  (NORCO) 7.5-325 MG tablet. Rosina stated provider gave instructions for pharmacy to notify provider if patient req a refill on this med.

## 2024-04-27 ENCOUNTER — Other Ambulatory Visit: Payer: Self-pay

## 2024-04-27 ENCOUNTER — Ambulatory Visit: Payer: MEDICAID | Admitting: Family Medicine

## 2024-04-27 ENCOUNTER — Encounter: Payer: Self-pay | Admitting: Family Medicine

## 2024-04-27 VITALS — BP 129/88 | HR 77 | Temp 98.2°F | Resp 18 | Ht 62.0 in | Wt 194.0 lb

## 2024-04-27 DIAGNOSIS — I10 Essential (primary) hypertension: Secondary | ICD-10-CM

## 2024-04-27 DIAGNOSIS — Z23 Encounter for immunization: Secondary | ICD-10-CM | POA: Diagnosis not present

## 2024-04-27 DIAGNOSIS — K219 Gastro-esophageal reflux disease without esophagitis: Secondary | ICD-10-CM | POA: Diagnosis not present

## 2024-04-27 DIAGNOSIS — S065XAA Traumatic subdural hemorrhage with loss of consciousness status unknown, initial encounter: Secondary | ICD-10-CM | POA: Diagnosis not present

## 2024-04-27 MED ORDER — LANSOPRAZOLE 30 MG PO CPDR: 30.0000 mg | DELAYED_RELEASE_CAPSULE | Freq: Two times a day (BID) | ORAL | 1 refills | Status: AC

## 2024-04-27 NOTE — Assessment & Plan Note (Signed)
 Stopped her lisinopril  20 mg and she is only on amlodipine  5 mg daily.  Blood pressure is well-controlled.
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
## 2024-04-27 NOTE — Assessment & Plan Note (Signed)
 CT scan 04/08/2024 shows resolution of her subdural hematomas.  She requested pain medicine and advised that she would have to get it through neurosurgery or through neurology

## 2024-04-29 ENCOUNTER — Ambulatory Visit: Payer: MEDICAID | Admitting: Podiatry

## 2024-04-29 DIAGNOSIS — S83512A Sprain of anterior cruciate ligament of left knee, initial encounter: Secondary | ICD-10-CM | POA: Insufficient documentation

## 2024-05-15 ENCOUNTER — Emergency Department
Admission: EM | Admit: 2024-05-15 | Discharge: 2024-05-15 | Disposition: A | Discharge status: Home / Self Care | Payer: MEDICAID | Attending: Emergency Medicine | Admitting: Emergency Medicine

## 2024-05-15 ENCOUNTER — Other Ambulatory Visit: Payer: Self-pay

## 2024-05-15 ENCOUNTER — Emergency Department: Payer: MEDICAID

## 2024-05-15 DIAGNOSIS — R519 Headache, unspecified: Secondary | ICD-10-CM | POA: Diagnosis present

## 2024-05-15 HISTORY — DX: Unspecified intracranial injury with loss of consciousness status unknown, initial encounter: S06.9XAA

## 2024-05-15 HISTORY — DX: Traumatic subdural hemorrhage with loss of consciousness status unknown, initial encounter: S06.5XAA

## 2024-05-15 HISTORY — DX: Unspecified convulsions: R56.9

## 2024-05-15 LAB — ETHANOL: Alcohol, Ethyl (B): <15 mg/dL (ref <15)

## 2024-05-15 LAB — DIFFERENTIAL
Abs Immature Granulocytes: 0.01 K/uL (ref 0.00–0.07)
Basophils Absolute: 0.1 K/uL (ref 0.0–0.1)
Basophils Relative: 1 %
Eosinophils Absolute: 0.1 K/uL (ref 0.0–0.5)
Eosinophils Relative: 2 %
Immature Granulocytes: 0 %
Lymphocytes Relative: 32 %
Lymphs Abs: 1.4 K/uL (ref 0.7–4.0)
Monocytes Absolute: 0.2 K/uL (ref 0.1–1.0)
Monocytes Relative: 5 %
Neutro Abs: 2.6 K/uL (ref 1.7–7.7)
Neutrophils Relative %: 60 %

## 2024-05-15 LAB — COMPREHENSIVE METABOLIC PANEL WITH GFR
ALT: 11 U/L (ref 0–44)
AST: 13 U/L — ABNORMAL LOW (ref 15–41)
Albumin: 3.4 g/dL — ABNORMAL LOW (ref 3.5–5.0)
Alkaline Phosphatase: 71 U/L (ref 38–126)
Anion gap: 10 (ref 5–15)
BUN: 10 mg/dL (ref 6–20)
CO2: 27 mmol/L (ref 22–32)
Calcium: 8.7 mg/dL — ABNORMAL LOW (ref 8.9–10.3)
Chloride: 104 mmol/L (ref 98–111)
Creatinine, Ser: 0.74 mg/dL (ref 0.44–1.00)
GFR, Estimated: >60 mL/min (ref >60)
Glucose, Bld: 97 mg/dL (ref 70–99)
Potassium: 4 mmol/L (ref 3.5–5.1)
Sodium: 141 mmol/L (ref 135–145)
Total Bilirubin: 0.3 mg/dL (ref 0.0–1.2)
Total Protein: 6.7 g/dL (ref 6.5–8.1)

## 2024-05-15 LAB — CBC
HCT: 38.4 % (ref 36.0–46.0)
Hemoglobin: 12.5 g/dL (ref 12.0–15.0)
MCH: 30.1 pg (ref 26.0–34.0)
MCHC: 32.6 g/dL (ref 30.0–36.0)
MCV: 92.5 fL (ref 80.0–100.0)
Platelets: 277 K/uL (ref 150–400)
RBC: 4.15 MIL/uL (ref 3.87–5.11)
RDW: 13.1 % (ref 11.5–15.5)
WBC: 4.3 K/uL (ref 4.0–10.5)
nRBC: 0 % (ref 0.0–0.2)

## 2024-05-15 LAB — PROTIME-INR
INR: 1 (ref 0.8–1.2)
Prothrombin Time: 13.4 s (ref 11.4–15.2)

## 2024-05-15 LAB — APTT: aPTT: 27 s (ref 24–36)

## 2024-05-15 MED ORDER — KETOROLAC TROMETHAMINE 15 MG/ML IJ SOLN
15.0000 mg | Freq: Once | INTRAMUSCULAR | Status: AC
  Administered 2024-05-15: 15 mg via INTRAVENOUS
  Filled 2024-05-15: qty 1

## 2024-05-15 MED ORDER — PROCHLORPERAZINE EDISYLATE 10 MG/2ML IJ SOLN
10.0000 mg | Freq: Once | INTRAMUSCULAR | Status: AC
  Administered 2024-05-15: 10 mg via INTRAVENOUS
  Filled 2024-05-15: qty 2

## 2024-05-15 MED ORDER — SODIUM CHLORIDE 0.9 % IV BOLUS
500.0000 mL | Freq: Once | INTRAVENOUS | Status: AC
  Administered 2024-05-15: 500 mL via INTRAVENOUS
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
## 2024-05-15 NOTE — ED Triage Notes (Signed)
 First nurse note: Pt reports 9/10 HA and vertigo like symptoms. KC reports hx of subdural hematoma. Last scan in August that was stable.
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
## 2024-05-15 NOTE — Discharge Instructions (Signed)
 You are seen in the emergency department for a headache.  The CT scan of your head was normal.  Your lab work was normal.  You were given IV headache medication.  You felt better and he wanted to go home and follow-up with your neurologist.  Return if you have any ongoing or worsening symptoms.

## 2024-05-17 ENCOUNTER — Encounter: Payer: Self-pay | Admitting: Radiology

## 2024-05-19 ENCOUNTER — Telehealth: Payer: Self-pay | Admitting: Family Medicine

## 2024-05-19 NOTE — Telephone Encounter (Signed)
 Copied from CRM [id number]. Topic: General - Call Back - No Documentation >> May 17, 2024 11:38 AM Olam RAMAN wrote: Reason for CRM: Pt would like an excuse for missing work and needs a note to be able to go back to work LIZ CLAIBORNE [phone]

## 2024-05-19 NOTE — Telephone Encounter (Signed)
 Called pt to see which dr took her out of work so she can get her return to work note from the proper facility. PT states she no longer needs note.

## 2024-05-22 ENCOUNTER — Emergency Department: Payer: MEDICAID

## 2024-05-22 ENCOUNTER — Encounter: Payer: Self-pay | Admitting: Emergency Medicine

## 2024-05-22 ENCOUNTER — Other Ambulatory Visit: Payer: Self-pay

## 2024-05-22 ENCOUNTER — Emergency Department
Admission: EM | Admit: 2024-05-22 | Discharge: 2024-05-22 | Disposition: A | Discharge status: Home / Self Care | Payer: MEDICAID | Attending: Emergency Medicine | Admitting: Emergency Medicine

## 2024-05-22 DIAGNOSIS — G8929 Other chronic pain: Secondary | ICD-10-CM | POA: Insufficient documentation

## 2024-05-22 DIAGNOSIS — M79672 Pain in left foot: Secondary | ICD-10-CM | POA: Insufficient documentation

## 2024-05-22 DIAGNOSIS — M1712 Unilateral primary osteoarthritis, left knee: Secondary | ICD-10-CM | POA: Diagnosis not present

## 2024-05-22 DIAGNOSIS — M25562 Pain in left knee: Secondary | ICD-10-CM | POA: Diagnosis present

## 2024-05-22 DIAGNOSIS — Z7951 Long term (current) use of inhaled steroids: Secondary | ICD-10-CM | POA: Diagnosis not present

## 2024-05-22 DIAGNOSIS — Z720 Tobacco use: Secondary | ICD-10-CM | POA: Diagnosis not present

## 2024-05-22 DIAGNOSIS — Z79899 Other long term (current) drug therapy: Secondary | ICD-10-CM | POA: Diagnosis not present

## 2024-05-22 DIAGNOSIS — I10 Essential (primary) hypertension: Secondary | ICD-10-CM | POA: Insufficient documentation

## 2024-05-22 DIAGNOSIS — J449 Chronic obstructive pulmonary disease, unspecified: Secondary | ICD-10-CM | POA: Insufficient documentation

## 2024-05-22 MED ORDER — KETOROLAC TROMETHAMINE 30 MG/ML IJ SOLN
30.0000 mg | Freq: Once | INTRAMUSCULAR | Status: AC
  Administered 2024-05-22: 30 mg via INTRAMUSCULAR
  Filled 2024-05-22: qty 1

## 2024-05-22 NOTE — Discharge Instructions (Addendum)
 Please use walker and knee brace as needed to help with mobility.  Continue with Tylenol  and ibuprofen  as needed for pain.  Call your orthopedic office Monday to schedule a follow-up appointment.  Return to the ER for any severe pain, swelling, warmth, redness, worsening symptoms or any urgent changes in your health.
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
## 2024-05-25 ENCOUNTER — Other Ambulatory Visit: Payer: Self-pay | Admitting: Orthopedic Surgery

## 2024-05-25 ENCOUNTER — Encounter: Payer: Self-pay | Admitting: Student in an Organized Health Care Education/Training Program

## 2024-05-25 ENCOUNTER — Ambulatory Visit
Payer: MEDICAID | Attending: Student in an Organized Health Care Education/Training Program | Admitting: Student in an Organized Health Care Education/Training Program

## 2024-05-25 ENCOUNTER — Telehealth: Payer: Self-pay | Admitting: Family Medicine

## 2024-05-25 ENCOUNTER — Encounter: Payer: Self-pay | Admitting: Family Medicine

## 2024-05-25 VITALS — BP 125/93 | HR 91 | Temp 98.1°F | Resp 16 | Ht 62.0 in | Wt 195.0 lb

## 2024-05-25 DIAGNOSIS — M25462 Effusion, left knee: Secondary | ICD-10-CM | POA: Insufficient documentation

## 2024-05-25 DIAGNOSIS — G43E01 Chronic migraine with aura, not intractable, with status migrainosus: Secondary | ICD-10-CM | POA: Insufficient documentation

## 2024-05-25 DIAGNOSIS — F418 Other specified anxiety disorders: Secondary | ICD-10-CM | POA: Insufficient documentation

## 2024-05-25 DIAGNOSIS — M25562 Pain in left knee: Secondary | ICD-10-CM | POA: Insufficient documentation

## 2024-05-25 DIAGNOSIS — G8929 Other chronic pain: Secondary | ICD-10-CM | POA: Diagnosis present

## 2024-05-25 DIAGNOSIS — G894 Chronic pain syndrome: Secondary | ICD-10-CM | POA: Insufficient documentation

## 2024-05-25 NOTE — Patient Instructions (Signed)
-  follow-up with neurology

## 2024-05-25 NOTE — Telephone Encounter (Signed)
 Copied from CRM [id number]. Topic: General - Other >> May 24, 2024 10:28 AM Zebedee SAUNDERS wrote: Reason for CRM: Pt would like medication for knee and migraine pain. Please send to  [facility] -[address] [place] [address] Phone: [phone] Fax: [phone]
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
## 2024-05-26 ENCOUNTER — Emergency Department: Payer: MEDICAID

## 2024-05-26 ENCOUNTER — Other Ambulatory Visit: Payer: Self-pay

## 2024-05-26 ENCOUNTER — Emergency Department
Admission: EM | Admit: 2024-05-26 | Discharge: 2024-05-26 | Disposition: A | Discharge status: Home / Self Care | Payer: MEDICAID | Attending: Emergency Medicine | Admitting: Emergency Medicine

## 2024-05-26 ENCOUNTER — Encounter: Payer: Self-pay | Admitting: Intensive Care

## 2024-05-26 DIAGNOSIS — R072 Precordial pain: Secondary | ICD-10-CM | POA: Diagnosis not present

## 2024-05-26 DIAGNOSIS — W010XXA Fall on same level from slipping, tripping and stumbling without subsequent striking against object, initial encounter: Secondary | ICD-10-CM | POA: Diagnosis not present

## 2024-05-26 DIAGNOSIS — R079 Chest pain, unspecified: Secondary | ICD-10-CM

## 2024-05-26 DIAGNOSIS — M545 Low back pain, unspecified: Secondary | ICD-10-CM | POA: Insufficient documentation

## 2024-05-26 DIAGNOSIS — I10 Essential (primary) hypertension: Secondary | ICD-10-CM | POA: Diagnosis not present

## 2024-05-26 DIAGNOSIS — M549 Dorsalgia, unspecified: Secondary | ICD-10-CM

## 2024-05-26 LAB — CBC
HCT: 41.4 % (ref 36.0–46.0)
Hemoglobin: 13.6 g/dL (ref 12.0–15.0)
MCH: 29.8 pg (ref 26.0–34.0)
MCHC: 32.9 g/dL (ref 30.0–36.0)
MCV: 90.6 fL (ref 80.0–100.0)
Platelets: 177 K/uL (ref 150–400)
RBC: 4.57 MIL/uL (ref 3.87–5.11)
RDW: 13.3 % (ref 11.5–15.5)
WBC: 5.8 K/uL (ref 4.0–10.5)
nRBC: 0 % (ref 0.0–0.2)

## 2024-05-26 LAB — BASIC METABOLIC PANEL WITH GFR
Anion gap: 11 (ref 5–15)
BUN: 15 mg/dL (ref 6–20)
CO2: 24 mmol/L (ref 22–32)
Calcium: 9.4 mg/dL (ref 8.9–10.3)
Chloride: 104 mmol/L (ref 98–111)
Creatinine, Ser: 0.82 mg/dL (ref 0.44–1.00)
GFR, Estimated: >60 mL/min (ref >60)
Glucose, Bld: 84 mg/dL (ref 70–99)
Potassium: 4.6 mmol/L (ref 3.5–5.1)
Sodium: 138 mmol/L (ref 135–145)

## 2024-05-26 LAB — TROPONIN T, HIGH SENSITIVITY
Troponin T High Sensitivity: <15 ng/L (ref 0–19)
Troponin T High Sensitivity: <15 ng/L (ref 0–19)

## 2024-05-26 MED ORDER — HYDROCODONE-ACETAMINOPHEN 5-325 MG PO TABS
1.0000 | ORAL_TABLET | Freq: Once | ORAL | Status: AC
  Administered 2024-05-26: 1 via ORAL
  Filled 2024-05-26: qty 1

## 2024-05-26 MED ORDER — HYDROCODONE-ACETAMINOPHEN 5-325 MG PO TABS: 1.0000 | ORAL_TABLET | ORAL | 0 refills | Status: DC | PRN

## 2024-05-26 NOTE — ED Triage Notes (Signed)
 Patient reports sharp, central chest pain that started today.   Reports having mechanical fall yesterday. Hit head and back. Now c/o neck and mid back pain.   A&O x4 during triage

## 2024-05-26 NOTE — ED Notes (Signed)
 Patient transported to X-ray

## 2024-05-26 NOTE — ED Provider Notes (Signed)
 [facility] Provider Note    Event Date/Time   First MD Initiated Contact with Patient 05/26/24 1116     (approximate)  History   Chief Complaint: Chest Pain  HPI  Norma [name] is a 48 y.o. female with a past medical history of anxiety, hypertension, TBI, presents to the emergency department for chest and back pain.  According to the patient she tripped yesterday falling backwards landing on her back.  Patient states she has been experiencing pain up and down her back but mostly in the lower back.  Today she was also experiencing chest pain so she came to the emergency department for evaluation.  Patient is scheduled for a left knee replacement per husband in a couple weeks and states she believes she fell because the knee gave out on her.  Is not having any increased knee pain today.   Physical Exam   Triage Vital Signs: ED Triage Vitals  Encounter Vitals Group     BP 05/26/24 1000 (!) 128/90     Girls Systolic BP Percentile --      Girls Diastolic BP Percentile --      Boys Systolic BP Percentile --      Boys Diastolic BP Percentile --      Pulse Rate 05/26/24 1000 94     Resp 05/26/24 1000 18     Temp 05/26/24 1000 98.6 F (37 C)     Temp Source 05/26/24 1000 Oral     SpO2 05/26/24 1000 99 %     Weight 05/26/24 1001 195 lb (88.5 kg)     Height 05/26/24 1001 5' 2 (1.575 m)     Head Circumference --      Peak Flow --      Pain Score 05/26/24 1000 9     Pain Loc --      Pain Education --      Exclude from Growth Chart --     Most recent vital signs: Vitals:   05/26/24 1000  BP: (!) 128/90  Pulse: 94  Resp: 18  Temp: 98.6 F (37 C)  SpO2: 99%    General: Awake, no distress.  CV:  Good peripheral perfusion.  Regular rate and rhythm  Resp:  Normal effort.  Equal breath sounds bilaterally.  Mild midsternal chest tenderness to palpation. Abd:  No distention.  Soft, nontender.  No rebound or guarding. Other:  Mild tenderness to  palpation most across the lumbar spine no step-off deformity or ecchymosis.   ED Results / Procedures / Treatments   EKG  EKG viewed and interpreted by myself shows a normal sinus rhythm at 93 bpm with a narrow QRS, normal axis, normal intervals, no concerning ST changes.  RADIOLOGY  I have reviewed interpreted the chest x-ray images.  No consolidation on my evaluation. Radiology is read the x-ray is negative   MEDICATIONS ORDERED IN ED: Medications - No data to display   IMPRESSION / MDM / ASSESSMENT AND PLAN / ED COURSE  I reviewed the triage vital signs and the nursing notes.  Patient's presentation is most consistent with acute presentation with potential threat to life or bodily function.  Patient presents to the emergency department for chest pain and back pain after a fall yesterday.  Overall the patient appears well she does have some mild back tenderness mostly across the lumbar spine.  No step-off or deformity.  No significant tenderness.  Patient's lab work shows reassuring CBC reassuring chemistry negative troponin.  Patient's chest x-ray is normal.  Will obtain x-ray images of the lumbar spine as a precaution.  Given the patient's otherwise reassuring workup as long as the lumbar spine x-ray does not show any significant abnormality I believe the patient could be discharged home with a very short course of pain medication and have the patient follow-up with her doctor.  Patient agreeable to plan.  Lumbar x-ray is negative.  Patient will be discharged home with a short course of pain medication and follow-up with her doctor.  FINAL CLINICAL IMPRESSION(S) / ED DIAGNOSES   Chest pain Back pain    Note:  This document was prepared using Dragon voice recognition software and may include unintentional dictation errors.   Dorothyann Drivers, MD 05/26/24 1309

## 2024-05-28 ENCOUNTER — Other Ambulatory Visit: Payer: Self-pay

## 2024-05-28 ENCOUNTER — Encounter: Payer: Self-pay | Admitting: Family Medicine

## 2024-05-28 ENCOUNTER — Ambulatory Visit: Payer: MEDICAID | Admitting: Family Medicine

## 2024-05-28 VITALS — BP 131/88 | HR 118 | Temp 97.9°F | Resp 18 | Ht 62.0 in | Wt 202.0 lb

## 2024-05-28 DIAGNOSIS — M48061 Spinal stenosis, lumbar region without neurogenic claudication: Secondary | ICD-10-CM

## 2024-05-28 MED ORDER — HYDROCODONE-ACETAMINOPHEN 5-325 MG PO TABS: 1.0000 | ORAL_TABLET | Freq: Three times a day (TID) | ORAL | 0 refills | Status: DC | PRN
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
## 2024-06-01 ENCOUNTER — Other Ambulatory Visit: Payer: Self-pay

## 2024-06-01 ENCOUNTER — Encounter
Admit: 2024-06-01 | Discharge: 2024-06-01 | Disposition: A | Discharge status: Home / Self Care | Payer: MEDICAID | Attending: Orthopedic Surgery | Admitting: Orthopedic Surgery

## 2024-06-01 HISTORY — DX: Pain in left knee: M25.562

## 2024-06-01 HISTORY — DX: Dyspnea, unspecified: R06.00

## 2024-06-01 HISTORY — DX: Pneumonia, unspecified organism: J18.9

## 2024-06-01 HISTORY — DX: Unspecified osteoarthritis, unspecified site: M19.90

## 2024-06-01 HISTORY — DX: Headache, unspecified: R51.9

## 2024-06-01 NOTE — Patient Instructions (Addendum)
 Your procedure is scheduled on: 06/08/24 - Tuesday Report to the Registration Desk on the 1st floor of the [facility]. To find out your arrival time, please call [phone] between 1PM - 3PM on: 06/07/24 - Monday If your arrival time is 6:00 am, do not arrive before that time as the [facility] entrance doors do not open until 6:00 am.  REMEMBER: Instructions that are not followed completely may result in serious medical risk, up to and including death; or upon the discretion of your surgeon and anesthesiologist your surgery may need to be rescheduled.  Do not eat food after midnight the night before surgery.  No gum chewing or hard candies.  You may however, drink CLEAR liquids up to 2 hours before you are scheduled to arrive for your surgery. Do not drink anything within 2 hours of your scheduled arrival time.  Clear liquids include: - water   - apple juice without pulp - gatorade (not RED colors) - black coffee or tea (Do NOT add milk or creamers to the coffee or tea) Do NOT drink anything that is not on this list.  In addition, your doctor has ordered for you to drink the provided:  Ensure Pre-Surgery Clear Carbohydrate Drink  Drinking this carbohydrate drink up to two hours before surgery helps to reduce insulin  resistance and improve patient outcomes. Please complete drinking 2 hours before scheduled arrival time.  One week prior to surgery: Stop Anti-inflammatories (NSAIDS) such as Advil , Aleve , Ibuprofen , Motrin , Naproxen , Naprosyn  and Aspirin based products such as Excedrin, Goody's Powder, BC Powder.  Stop ANY OVER THE COUNTER supplements until after surgery.  You may continue to take Tylenol  and/or hydrocodone  if needed for pain up until the day of surgery.  ON THE DAY OF SURGERY ONLY TAKE THESE MEDICATIONS WITH SIPS OF WATER :  amLODipine  (NORVASC )  clonazePAM  (KLONOPIN )  FLUoxetine  (PROZAC )  4.   lansoprazole  (PREVACID )   Use inhalers albuterol  (VENTOLIN  HFA)   on the day of surgery and bring to the hospital.   No Alcohol for 24 hours before or after surgery.  No Smoking including e-cigarettes for 24 hours before surgery.  No chewable tobacco products for at least 6 hours before surgery.  No nicotine  patches on the day of surgery.  Do not use any recreational drugs for at least a week (preferably 2 weeks) before your surgery.  Please be advised that the combination of cocaine and anesthesia may have negative outcomes, up to and including death. If you test positive for cocaine, your surgery will be cancelled.  On the morning of surgery brush your teeth with toothpaste and water , you may rinse your mouth with mouthwash if you wish. Do not swallow any toothpaste or mouthwash.  Use CHG Soap or wipes as directed on instruction sheet.  Do not wear jewelry, make-up, hairpins, clips or nail polish.  For welded (permanent) jewelry: bracelets, anklets, waist bands, etc.  Please have this removed prior to surgery.  If it is not removed, there is a chance that hospital personnel will need to cut it off on the day of surgery.  Do not wear lotions, powders, or perfumes.   Do not shave body hair from the neck down 48 hours before surgery.  Contact lenses, hearing aids and dentures may not be worn into surgery.  Do not bring valuables to the hospital. [facility] is not responsible for any missing/lost belongings or valuables.   Notify your doctor if there is any change in your medical condition (cold,  fever, infection).  Wear comfortable clothing (specific to your surgery type) to the hospital.  After surgery, you can help prevent lung complications by doing breathing exercises.  Take deep breaths and cough every 1-2 hours. Your doctor may order a device called an Incentive Spirometer to help you take deep breaths.  If you are being admitted to the hospital overnight, leave your suitcase in the car. After surgery it may be brought to your  room.  In case of increased patient census, it may be necessary for you, the patient, to continue your postoperative care in the Same Day Surgery department.  If you are being discharged the day of surgery, you will not be allowed to drive home. You will need a responsible individual to drive you home and stay with you for 24 hours after surgery.   If you are taking public transportation, you will need to have a responsible individual with you.  Please call the Pre-admissions Testing Dept. at [phone] if you have any questions about these instructions.  Surgery Visitation Policy:  Patients having surgery or a procedure may have two visitors.  Children under the age of 14 must have an adult with them who is not the patient.  Inpatient Visitation:    Visiting hours are 7 a.m. to 8 p.m. Up to four visitors are allowed at one time in a patient room. The visitors may rotate out with other people during the day.  One visitor age 71 or older may stay with the patient overnight and must be in the room by 8 p.m.   Merchandiser, Retail to address health-related social needs:  [email or web address]

## 2024-06-04 ENCOUNTER — Other Ambulatory Visit: Payer: Self-pay | Admitting: Family Medicine

## 2024-06-04 DIAGNOSIS — M48061 Spinal stenosis, lumbar region without neurogenic claudication: Secondary | ICD-10-CM

## 2024-06-04 NOTE — Telephone Encounter (Unsigned)
 Copied from CRM #[id number]. Topic: Clinical - Medication Question >> Jun 04, 2024 12:59 PM Emylou G wrote: Reason for CRM: Patient called...said finisihed her last pain med today.. bad headaches and knee pain - she is having knee surgery soon.. she is willing to come in or can she get a script?

## 2024-06-04 NOTE — Telephone Encounter (Unsigned)
 Copied from CRM #[id number]. Topic: Clinical - Medication Refill >> Jun 04, 2024  2:36 PM Tiffini S wrote: Medication: HYDROcodone -acetaminophen  (NORCO/VICODIN) 5-325 MG tablet  Has the patient contacted their pharmacy? No (Agent: If no, request that the patient contact the pharmacy for the refill. If patient does not wish to contact the pharmacy document the reason why and proceed with request.) (Agent: If yes, when and what did the pharmacy advise?)  This is the patient's preferred pharmacy:  [facility] - [place], [place] - [address] [place] [address] Phone: [phone] Fax: [phone]  Is this the correct pharmacy for this prescription? Yes If no, delete pharmacy and type the correct one.   Has the prescription been filled recently? Yes  Is the patient out of the medication? Yes  Has the patient been seen for an appointment in the last year OR does the patient have an upcoming appointment? Yes  Can we respond through MyChart? Yes  Agent: Please be advised that Rx refills may take up to 3 business days. We ask that you follow-up with your pharmacy.

## 2024-06-08 ENCOUNTER — Encounter
Admission: RE | Disposition: A | Discharge status: Home / Self Care | Payer: Self-pay | Source: Home / Self Care | Attending: Orthopedic Surgery

## 2024-06-08 ENCOUNTER — Other Ambulatory Visit: Payer: Self-pay

## 2024-06-08 ENCOUNTER — Ambulatory Visit: Payer: MEDICAID

## 2024-06-08 ENCOUNTER — Encounter: Payer: Self-pay | Admitting: Orthopedic Surgery

## 2024-06-08 ENCOUNTER — Ambulatory Visit
Admission: RE | Admit: 2024-06-08 | Discharge: 2024-06-08 | Disposition: A | Discharge status: Home / Self Care | Payer: MEDICAID | Attending: Orthopedic Surgery | Admitting: Orthopedic Surgery

## 2024-06-08 DIAGNOSIS — E66813 Obesity, class 3: Secondary | ICD-10-CM | POA: Diagnosis not present

## 2024-06-08 DIAGNOSIS — J449 Chronic obstructive pulmonary disease, unspecified: Secondary | ICD-10-CM | POA: Insufficient documentation

## 2024-06-08 DIAGNOSIS — F419 Anxiety disorder, unspecified: Secondary | ICD-10-CM | POA: Diagnosis not present

## 2024-06-08 DIAGNOSIS — I10 Essential (primary) hypertension: Secondary | ICD-10-CM | POA: Diagnosis not present

## 2024-06-08 DIAGNOSIS — S83512A Sprain of anterior cruciate ligament of left knee, initial encounter: Secondary | ICD-10-CM | POA: Insufficient documentation

## 2024-06-08 DIAGNOSIS — Z79899 Other long term (current) drug therapy: Secondary | ICD-10-CM | POA: Diagnosis not present

## 2024-06-08 DIAGNOSIS — S83242A Other tear of medial meniscus, current injury, left knee, initial encounter: Secondary | ICD-10-CM | POA: Diagnosis present

## 2024-06-08 DIAGNOSIS — Z6836 Body mass index (BMI) 36.0-36.9, adult: Secondary | ICD-10-CM | POA: Diagnosis not present

## 2024-06-08 DIAGNOSIS — Z87891 Personal history of nicotine dependence: Secondary | ICD-10-CM | POA: Insufficient documentation

## 2024-06-08 DIAGNOSIS — F32A Depression, unspecified: Secondary | ICD-10-CM | POA: Insufficient documentation

## 2024-06-08 HISTORY — PX: KNEE ARTHROSCOPY WITH MEDIAL MENISECTOMY: SHX5651

## 2024-06-08 HISTORY — PX: KNEE ARTHROSCOPY WITH ANTERIOR CRUCIATE LIGAMENT (ACL) REPAIR WITH HAMSTRING GRAFT: SHX5645

## 2024-06-08 SURGERY — KNEE ARTHROSCOPY WITH ANTERIOR CRUCIATE LIGAMENT (ACL) REPAIR WITH HAMSTRING GRAFT
Anesthesia: General | Site: Knee | Laterality: Left

## 2024-06-08 MED ORDER — ONDANSETRON HCL 4 MG/2ML IJ SOLN
INTRAMUSCULAR | Status: DC | PRN
  Administered 2024-06-08: 4 mg via INTRAVENOUS

## 2024-06-08 MED ORDER — LIDOCAINE HCL (CARDIAC) PF 100 MG/5ML IV SOSY
PREFILLED_SYRINGE | INTRAVENOUS | Status: DC | PRN
  Administered 2024-06-08: 100 mg via INTRAVENOUS

## 2024-06-08 MED ORDER — PROPOFOL 10 MG/ML IV BOLUS
INTRAVENOUS | Status: AC
  Filled 2024-06-08: qty 20

## 2024-06-08 MED ORDER — OXYCODONE HCL 5 MG PO TABS
ORAL_TABLET | ORAL | Status: AC
  Filled 2024-06-08: qty 2

## 2024-06-08 MED ORDER — ONDANSETRON 4 MG PO TBDP: 4.0000 mg | ORAL_TABLET | Freq: Three times a day (TID) | ORAL | 0 refills | Status: DC | PRN

## 2024-06-08 MED ORDER — DEXMEDETOMIDINE HCL IN NACL 80 MCG/20ML IV SOLN
INTRAVENOUS | Status: AC
  Filled 2024-06-08: qty 20

## 2024-06-08 MED ORDER — FENTANYL CITRATE (PF) 100 MCG/2ML IJ SOLN
INTRAMUSCULAR | Status: DC | PRN
  Administered 2024-06-08 (×2): 50 ug via INTRAVENOUS

## 2024-06-08 MED ORDER — BUPIVACAINE HCL 0.5 % IJ SOLN
INTRAMUSCULAR | Status: DC | PRN
  Administered 2024-06-08: 40 mL via INTRAMUSCULAR

## 2024-06-08 MED ORDER — DEXAMETHASONE SOD PHOSPHATE PF 10 MG/ML IJ SOLN
INTRAMUSCULAR | Status: DC | PRN
  Administered 2024-06-08: 10 mg via INTRAVENOUS

## 2024-06-08 MED ORDER — KETOROLAC TROMETHAMINE 30 MG/ML IJ SOLN
INTRAMUSCULAR | Status: AC
Start: 2024-06-08 — End: 2024-06-08
  Filled 2024-06-08: qty 1

## 2024-06-08 MED ORDER — OXYCODONE HCL 5 MG/5ML PO SOLN: 5.0000 mg | Freq: Once | ORAL | Status: DC | PRN

## 2024-06-08 MED ORDER — CEFAZOLIN SODIUM-DEXTROSE 2-4 GM/100ML-% IV SOLN
INTRAVENOUS | Status: AC
Start: 2024-06-08 — End: 2024-06-08
  Filled 2024-06-08: qty 100

## 2024-06-08 MED ORDER — PROPOFOL 10 MG/ML IV BOLUS
INTRAVENOUS | Status: DC | PRN
  Administered 2024-06-08: 160 mg via INTRAVENOUS

## 2024-06-08 MED ORDER — SUGAMMADEX SODIUM 200 MG/2ML IV SOLN
INTRAVENOUS | Status: DC | PRN
  Administered 2024-06-08: 183.2 mg via INTRAVENOUS

## 2024-06-08 MED ORDER — 0.9 % SODIUM CHLORIDE (POUR BTL) OPTIME
TOPICAL | Status: DC | PRN
  Administered 2024-06-08: 200 mL

## 2024-06-08 MED ORDER — FENTANYL CITRATE (PF) 100 MCG/2ML IJ SOLN
INTRAMUSCULAR | Status: AC
  Filled 2024-06-08: qty 2

## 2024-06-08 MED ORDER — TRANEXAMIC ACID-NACL 1000-0.7 MG/100ML-% IV SOLN
INTRAVENOUS | Status: AC
  Filled 2024-06-08: qty 100

## 2024-06-08 MED ORDER — KETOROLAC TROMETHAMINE 30 MG/ML IJ SOLN
INTRAMUSCULAR | Status: DC | PRN
  Administered 2024-06-08: 30 mg via INTRAVENOUS

## 2024-06-08 MED ORDER — LIDOCAINE-EPINEPHRINE 1 %-1:100000 IJ SOLN
INTRAMUSCULAR | Status: AC
  Filled 2024-06-08: qty 1

## 2024-06-08 MED ORDER — MIDAZOLAM HCL 2 MG/2ML IJ SOLN
INTRAMUSCULAR | Status: AC
  Filled 2024-06-08: qty 2

## 2024-06-08 MED ORDER — ROCURONIUM BROMIDE 10 MG/ML (PF) SYRINGE
PREFILLED_SYRINGE | INTRAVENOUS | Status: AC
  Filled 2024-06-08: qty 10

## 2024-06-08 MED ORDER — ORAL CARE MOUTH RINSE: 15.0000 mL | Freq: Once | OROMUCOSAL | Status: AC

## 2024-06-08 MED ORDER — GABAPENTIN 300 MG PO CAPS: 300.0000 mg | ORAL_CAPSULE | Freq: Three times a day (TID) | ORAL | 0 refills | Status: AC

## 2024-06-08 MED ORDER — CHLORHEXIDINE GLUCONATE 0.12 % MT SOLN
OROMUCOSAL | Status: AC
  Filled 2024-06-08: qty 15

## 2024-06-08 MED ORDER — VANCOMYCIN HCL 1000 MG IV SOLR
INTRAVENOUS | Status: AC
  Filled 2024-06-08: qty 20

## 2024-06-08 MED ORDER — LACTATED RINGERS IR SOLN
Status: DC | PRN
  Administered 2024-06-08 (×6): 3000 mL

## 2024-06-08 MED ORDER — BUPIVACAINE HCL (PF) 0.5 % IJ SOLN
INTRAMUSCULAR | Status: AC
  Filled 2024-06-08: qty 30

## 2024-06-08 MED ORDER — FENTANYL CITRATE (PF) 100 MCG/2ML IJ SOLN
25.0000 ug | INTRAMUSCULAR | Status: DC | PRN
  Administered 2024-06-08 (×3): 50 ug via INTRAVENOUS

## 2024-06-08 MED ORDER — OXYCODONE HCL 5 MG PO TABS: 5.0000 mg | ORAL_TABLET | ORAL | 0 refills | Status: DC | PRN

## 2024-06-08 MED ORDER — ROCURONIUM BROMIDE 100 MG/10ML IV SOLN
INTRAVENOUS | Status: DC | PRN
  Administered 2024-06-08: 60 mg via INTRAVENOUS
  Administered 2024-06-08: 20 mg via INTRAVENOUS

## 2024-06-08 MED ORDER — VANCOMYCIN HCL 1000 MG IV SOLR
INTRAVENOUS | Status: DC | PRN
  Administered 2024-06-08: 1000 mg

## 2024-06-08 MED ORDER — CHLORHEXIDINE GLUCONATE 0.12 % MT SOLN
15.0000 mL | Freq: Once | OROMUCOSAL | Status: AC
  Administered 2024-06-08: 15 mL via OROMUCOSAL

## 2024-06-08 MED ORDER — LACTATED RINGERS IV SOLN: INTRAVENOUS | Status: DC

## 2024-06-08 MED ORDER — BUPIVACAINE LIPOSOME 1.3 % IJ SUSP
INTRAMUSCULAR | Status: AC
  Filled 2024-06-08: qty 20

## 2024-06-08 MED ORDER — TRANEXAMIC ACID 1000 MG/10ML IV SOLN: 1000.0000 mg | Freq: Once | INTRAVENOUS | Status: DC

## 2024-06-08 MED ORDER — OXYCODONE HCL 5 MG PO TABS: 5.0000 mg | ORAL_TABLET | Freq: Once | ORAL | Status: DC | PRN

## 2024-06-08 MED ORDER — ONDANSETRON HCL 4 MG/2ML IJ SOLN
INTRAMUSCULAR | Status: AC
  Filled 2024-06-08: qty 2

## 2024-06-08 MED ORDER — MIDAZOLAM HCL (PF) 2 MG/2ML IJ SOLN: 1.0000 mg | Freq: Once | INTRAMUSCULAR | Status: DC

## 2024-06-08 MED ORDER — MIDAZOLAM HCL (PF) 2 MG/2ML IJ SOLN
INTRAMUSCULAR | Status: DC | PRN
  Administered 2024-06-08: 2 mg via INTRAVENOUS

## 2024-06-08 MED ORDER — OXYCODONE HCL 5 MG PO TABS
10.0000 mg | ORAL_TABLET | Freq: Once | ORAL | Status: AC
  Administered 2024-06-08: 10 mg via ORAL

## 2024-06-08 MED ORDER — DIAZEPAM 5 MG PO TABS: 5.0000 mg | ORAL_TABLET | Freq: Three times a day (TID) | ORAL | 0 refills | Status: AC | PRN

## 2024-06-08 MED ORDER — ASPIRIN 325 MG PO TBEC: 325.0000 mg | DELAYED_RELEASE_TABLET | Freq: Every day | ORAL | 0 refills | Status: DC

## 2024-06-08 MED ORDER — ALBUTEROL SULFATE HFA 108 (90 BASE) MCG/ACT IN AERS
INHALATION_SPRAY | RESPIRATORY_TRACT | Status: DC | PRN
  Administered 2024-06-08: 2 via RESPIRATORY_TRACT

## 2024-06-08 MED ORDER — ACETAMINOPHEN 500 MG PO TABS: 1000.0000 mg | ORAL_TABLET | Freq: Three times a day (TID) | ORAL | 2 refills | Status: AC

## 2024-06-08 MED ORDER — ACETAMINOPHEN 10 MG/ML IV SOLN
INTRAVENOUS | Status: AC
  Filled 2024-06-08: qty 100

## 2024-06-08 MED ORDER — TRANEXAMIC ACID-NACL 1000-0.7 MG/100ML-% IV SOLN
1000.0000 mg | Freq: Once | INTRAVENOUS | Status: AC
  Administered 2024-06-08: 1000 mg via INTRAVENOUS

## 2024-06-08 MED ORDER — FENTANYL CITRATE (PF) 50 MCG/ML IJ SOSY: 50.0000 ug | PREFILLED_SYRINGE | Freq: Once | INTRAMUSCULAR | Status: DC

## 2024-06-08 MED ORDER — CEFAZOLIN SODIUM-DEXTROSE 2-4 GM/100ML-% IV SOLN
2.0000 g | INTRAVENOUS | Status: AC
  Administered 2024-06-08: 2 g via INTRAVENOUS

## 2024-06-08 MED ORDER — DEXMEDETOMIDINE HCL IN NACL 80 MCG/20ML IV SOLN
INTRAVENOUS | Status: DC | PRN
  Administered 2024-06-08: 8 ug via INTRAVENOUS

## 2024-06-08 MED ORDER — IBUPROFEN 800 MG PO TABS: 800.0000 mg | ORAL_TABLET | Freq: Three times a day (TID) | ORAL | 0 refills | Status: AC

## 2024-06-08 MED ORDER — LIDOCAINE HCL (PF) 2 % IJ SOLN
INTRAMUSCULAR | Status: AC
  Filled 2024-06-08: qty 5

## 2024-06-08 SURGICAL SUPPLY — 68 items
ADAPTER IRRIG TUBE 2 SPIKE SOL (ADAPTER) ×2 IMPLANT
ANCHOR TIGHTROPE II 20 W/IB (Anchor) IMPLANT
BLADE FULL RADIUS 3.5 (BLADE) IMPLANT
BLADE SHAVER 4.5X7 STR FR (MISCELLANEOUS) ×1 IMPLANT
BLADE SURG 15 STRL LF DISP TIS (BLADE) ×3 IMPLANT
BLADE SURG SZ10 CARB STEEL (BLADE) ×1 IMPLANT
BLADE SURG SZ11 CARB STEEL (BLADE) ×1 IMPLANT
BNDG COHESIVE 6X5 TAN ST LF (GAUZE/BANDAGES/DRESSINGS) ×1 IMPLANT
BNDG ELASTIC 6INX 5YD STR LF (GAUZE/BANDAGES/DRESSINGS) ×1 IMPLANT
BNDG ESMARCH 6X12 STRL LF (GAUZE/BANDAGES/DRESSINGS) ×1 IMPLANT
BUR BR 5.5 WIDE MOUTH (BURR) IMPLANT
CARTRIDGE SUT 2-0 NONSTITCH (Anchor) IMPLANT
CHLORAPREP W/TINT 26 (MISCELLANEOUS) ×2 IMPLANT
COOLER ICEMAN CLASSIC (MISCELLANEOUS) ×1 IMPLANT
COVER LIGHT HANDLE STERIS (MISCELLANEOUS) ×2 IMPLANT
CUFF TRNQT CYL 24X4X16.5-23 (TOURNIQUET CUFF) IMPLANT
CUFF TRNQT CYL 30X4X21-28X (TOURNIQUET CUFF) IMPLANT
CUTTER SUT KNOT PUSHER AIR (CUTTER) IMPLANT
DERMABOND ADVANCED .7 DNX12 (GAUZE/BANDAGES/DRESSINGS) ×1 IMPLANT
DRAPE ARTHROSCOPY W/POUCH 90 (DRAPES) ×1 IMPLANT
DRAPE FLUOR MINI C-ARM 54X84 (DRAPES) ×1 IMPLANT
DRAPE IMP U-DRAPE 54X76 (DRAPES) ×1 IMPLANT
DRAPE SHEET LG 3/4 BI-LAMINATE (DRAPES) ×1 IMPLANT
DRAPE TABLE BACK 80X90 (DRAPES) ×1 IMPLANT
DRILL FLIPCUTTER III 6-12 (ORTHOPEDIC DISPOSABLE SUPPLIES) IMPLANT
ELECTRODE REM PT RTRN 9FT ADLT (ELECTROSURGICAL) ×1 IMPLANT
GAUZE SPONGE 4X4 12PLY STRL (GAUZE/BANDAGES/DRESSINGS) ×1 IMPLANT
GLOVE BIOGEL PI IND STRL 8 (GLOVE) ×2 IMPLANT
GOWN SRG LRG LVL 4 IMPRV REINF (GOWNS) ×1 IMPLANT
GOWN SRG XL LONG LVL 3 NONREIN (GOWNS) ×2 IMPLANT
GRADUATE 1200CC STRL 31836 (MISCELLANEOUS) ×1 IMPLANT
GRAFT TISS ANT TIB TNDN (Tissue) IMPLANT
GUIDEWIRE 1.2MMX18 (WIRE) IMPLANT
IV LR IRRIG 3000ML ARTHROMATIC (IV SOLUTION) ×6 IMPLANT
KIT TRANSTIBIAL (DISPOSABLE) IMPLANT
KIT TURNOVER KIT A (KITS) ×1 IMPLANT
MANAGER SUT NOVOCUT (CUTTER) IMPLANT
MANIFOLD NEPTUNE II (INSTRUMENTS) ×2 IMPLANT
MAT ABSORB FLUID 56X50 GRAY (MISCELLANEOUS) ×2 IMPLANT
NDL HYPO 22X1.5 SAFETY MO (MISCELLANEOUS) IMPLANT
NS IRRIG 500ML POUR BTL (IV SOLUTION) ×1 IMPLANT
PACK ARTHROSCOPY KNEE (MISCELLANEOUS) ×1 IMPLANT
PAD ABD DERMACEA PRESS 5X9 (GAUZE/BANDAGES/DRESSINGS) ×2 IMPLANT
PAD COLD UNI WRAP-ON (PAD) ×1 IMPLANT
PADDING CAST COTTON 6X4 STRL (CAST SUPPLIES) ×1 IMPLANT
PENCIL SMOKE EVACUATOR (MISCELLANEOUS) ×1 IMPLANT
REAMER LO PROFILE (MISCELLANEOUS) IMPLANT
SCREW BIOCOMPOSITE 8X20 INTER (Screw) IMPLANT
SCREW FT BIOCOMP 9X30 (Screw) IMPLANT
SOLN STERILE WATER 500 ML (IV SOLUTION) ×1 IMPLANT
SPONGE T-LAP 18X18 ~~LOC~~+RFID (SPONGE) ×1 IMPLANT
STOCKINETTE BIAS CUT 6 980064 (GAUZE/BANDAGES/DRESSINGS) IMPLANT
SUT FIBERSNARE 2 CLSD LOOP (SUTURE) IMPLANT
SUT VIC AB 0 CT1 36 (SUTURE) ×1 IMPLANT
SUT VIC AB 2-0 CT2 27 (SUTURE) ×2 IMPLANT
SUTURE 2 FIBERLOOP 20 STRT BLU (SUTURE) IMPLANT
SUTURE EHLN 3-0 FS-10 30 BLK (SUTURE) ×1 IMPLANT
SUTURE FIBERWR #2 38 T-5 BLUE (SUTURE) ×2 IMPLANT
SUTURE MNCRL 4-0 27XMF (SUTURE) ×1 IMPLANT
SYR BULB IRRIG 60ML STRL (SYRINGE) ×1 IMPLANT
SYS ANCHOR SUT W/2-0 BLU CO-BR (Anchor) IMPLANT
SYSTEM NVSTCH PRO MENISCAL 2-0 (Miscellaneous) IMPLANT
TOWEL OR 17X26 4PK STRL BLUE (TOWEL DISPOSABLE) IMPLANT
TRAP FLUID SMOKE EVACUATOR (MISCELLANEOUS) ×1 IMPLANT
TRAY FOLEY SLVR 16FR LF STAT (SET/KITS/TRAYS/PACK) IMPLANT
TUBE SET DOUBLEFLO INFLOW (TUBING) ×1 IMPLANT
TUBE SET DOUBLEFLO OUTFLOW (TUBING) ×1 IMPLANT
WAND WEREWOLF FLOW 90D (MISCELLANEOUS) ×1 IMPLANT

## 2024-06-08 NOTE — Anesthesia Postprocedure Evaluation (Signed)
 Anesthesia Post Note  Patient: Norma [name]  Procedure(s) Performed: KNEE ARTHROSCOPY WITH ANTERIOR CRUCIATE LIGAMENT (ACL) REPAIR WITH HAMSTRING GRAFT (Left: Knee) ARTHROSCOPY, KNEE, WITH MEDIAL MENISCECTOMY (Left: Knee)  Patient location during evaluation: PACU Anesthesia Type: General Level of consciousness: awake and alert Pain management: pain level controlled Vital Signs Assessment: post-procedure vital signs reviewed and stable Respiratory status: spontaneous breathing, nonlabored ventilation, respiratory function stable and patient connected to nasal cannula oxygen  Cardiovascular status: blood pressure returned to baseline and stable Postop Assessment: no apparent nausea or vomiting Anesthetic complications: no   No notable events documented.   Last Vitals:  Vitals:   06/08/24 0742  BP: (!) 140/93  Pulse: 72  Resp: 16  Temp: (!) 36.2 C  SpO2: 98%    Last Pain:  Vitals:   06/08/24 1122  TempSrc:   PainSc: 8                  Debby Mines
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
## 2024-06-08 NOTE — Anesthesia Procedure Notes (Signed)
 Procedure Name: Intubation Date/Time: 06/08/2024 9:25 AM  Performed by: Duwayne Craven, CRNAPre-anesthesia Checklist: Patient identified, Patient being monitored, Timeout performed, Emergency Drugs available and Suction available Patient Re-evaluated:Patient Re-evaluated prior to induction Oxygen  Delivery Method: Circle system utilized Preoxygenation: Pre-oxygenation with 100% oxygen  Induction Type: IV induction Ventilation: Mask ventilation without difficulty Laryngoscope Size: Mac and 3 Grade View: Grade I Tube type: Oral Tube size: 7.0 mm Number of attempts: 1 Airway Equipment and Method: Stylet Placement Confirmation: ETT inserted through vocal cords under direct vision, positive ETCO2 and breath sounds checked- equal and bilateral Secured at: 21 cm Tube secured with: Tape Dental Injury: Teeth and Oropharynx as per pre-operative assessment

## 2024-06-08 NOTE — Transfer of Care (Signed)
 Immediate Anesthesia Transfer of Care Note  Patient: Norma [name]  Procedure(s) Performed: KNEE ARTHROSCOPY WITH ANTERIOR CRUCIATE LIGAMENT (ACL) REPAIR WITH HAMSTRING GRAFT (Left: Knee) ARTHROSCOPY, KNEE, WITH MEDIAL MENISCECTOMY (Left: Knee)  Patient Location: PACU  Anesthesia Type:General  Level of Consciousness: drowsy  Airway & Oxygen  Therapy: Patient Spontanous Breathing and Patient connected to face mask oxygen   Post-op Assessment: Report given to RN and Post -op Vital signs reviewed and stable  Post vital signs: Reviewed and stable  Last Vitals:  Vitals Value Taken Time  BP 108/73 06/08/24 11:15  Temp    Pulse 71 06/08/24 11:16  Resp 19 06/08/24 11:16  SpO2 100 % 06/08/24 11:16  Vitals shown include unfiled device data.  Last Pain:  Vitals:   06/08/24 0742  TempSrc: Temporal  PainSc: 9          Complications: No notable events documented.

## 2024-06-08 NOTE — Discharge Instructions (Addendum)
 Arthroscopic ACL Surgery with Meniscus Repair   Post-Op Instructions   1. Bracing or crutches: Crutches will be provided at the time of discharge from the surgery center.    2. Ice: You may be provided with a device [facility]) that allows you to ice the affected area effectively. Otherwise you can ice manually.   3. Driving:  Driving: Off all narcotic pain meds when operating vehicle   1 week for automatic cars, left leg surgery  4 weeks for standard/manual cars or right leg surgery   4. Activity: Ankle pumps several times an hour while awake to prevent blood clots. Weight bearing: foot-flat weight bearing is permitted with brace locked in extension (weight of leg for balance only -- must use arms for support when operative leg is on the ground). The brace should not be unlocked in order to protect the meniscus repair. Unlock only for hygiene and for exercises as directed by physical therapist. Make sure knee stays in an extended (fully straight) position. Place padding under the heel to make sure nothing is under the knee. Avoid standing more than 5 minutes (consecutively) for the first week. No exercise involving the knee until cleared by the surgeon or physical therapist. Ideally, you should avoid long distance travel for 4 weeks.   5. Medications:  - You have been provided a prescription for narcotic pain medicine (oxycodone). After surgery, take 1-2 narcotic tablets every 4 hours if needed for severe pain.  - A prescription for anti-nausea medication (Zofran ) will be provided in case the narcotic medicine causes nausea - take 1 tablet every 6 hours only if nauseated.  - Take ibuprofen 800 mg every 8 hours with food (scheduled) to reduce post-operative knee swelling. DO NOT STOP IBUPROFEN POST-OP UNTIL INSTRUCTED TO DO SO at first post-op office visit (10-14 days after surgery).  - Take enteric coated aspirin 325 mg once daily for 4 weeks to prevent blood clots.  -Take tylenol  1000 mg every  8 hours for pain (scheduled).  May stop tylenol  ~1-2 weeks after surgery if you are having minimal pain. - Take gabapentin 300 mg three times daily for 5 days (scheduled) - Take Valium 5mg  three times daily as needed x 2 weeks. May stop if not having any significant muscle spasms.  If you are taking prescription medication for anxiety, depression, insomnia, muscle spasm, chronic pain, or for attention deficit disorder you are advised that you are at a higher risk of adverse effects with use of narcotics post-op, including narcotic addiction/dependence, depressed breathing, death. If you use non-prescribed substances: alcohol, marijuana, cocaine, heroin, methamphetamines, etc., you are at a higher risk of adverse effects with use of narcotics post-op, including narcotic addiction/dependence, depressed breathing, death. You are advised that taking > 50 morphine milligram equivalents (MME) of narcotic pain medication per day results in twice the risk of overdose or death. For your prescription provided: oxycodone 5 mg - taking more than 6 tablets per day. Be advised that we will prescribe narcotics short-term, for acute post-operative pain only - 1 week for minor operations such as knee arthroscopy for meniscus tear resection, and 3 weeks for major operations such as knee repair/reconstruction surgeries.   6. Bandages: The physical therapist should change the bandages at the first post-op appointment. If needed, the dressing supplies have been provided to you.   7. Physical Therapy: 2 times per week for the first 4 weeks, then 1-2 times per week from weeks 4-8 post-op. Therapy typically starts within 1 week.  You have been provided an order for physical therapy. The therapist will provide home exercises. Attending physical therapy and performing the appropriate exercises on your own at home daily will determine how well you do after this surgery. If you do not know when your first physical therapy appointment  is, please call the office at [phone]. 8. Work/School: May return when able to tolerate standing for greater than 2 hours and off of narcotic pain medications. Can return to school usually in ~1-2 weeks.    9. Post-Op Appointments: Your first post-op appointment will be with Dr. Lydia Sams in approximately 2 weeks time.    If you find that they have not been scheduled please call the Orthopaedic Appointment front desk at [phone].

## 2024-06-08 NOTE — H&P (Signed)
 Paper H&P to be scanned into permanent record. H&P reviewed. No significant changes noted.

## 2024-06-08 NOTE — Telephone Encounter (Unsigned)
 Copied from CRM [id number]. Topic: Clinical - Prescription Issue >> Jun 08, 2024  1:42 PM Rosaria BRAVO wrote: Reason for CRM: Rosina from the pharmacy called regarding the patient getting narcotics from multiple providers.  [facility] - [place], [place] - [address] [place] [address] Phone: [phone] Fax: [phone]  Best contact: [phone]

## 2024-06-08 NOTE — Anesthesia Preprocedure Evaluation (Signed)
 Anesthesia Evaluation  Patient identified by MRN, date of birth, ID band Patient awake  General Assessment Comment:  Patient requesting anxiety and pain medication soon after I walked into the room and introduced myself for my preoperative evaluation. She has right sided abdominal and flank pain ,corresponding with the location of her kidney stone. I assured her we will work with her to manage her pain, but that sedating her prior to the entire team (surveyor, minerals) talking to her would not be appropriate. She understands.  Reviewed: Allergy & Precautions, NPO status , Patient's Chart, lab work & pertinent test results  History of Anesthesia Complications Negative for: history of anesthetic complications  Airway Mallampati: II  TM Distance: <3 FB Neck ROM: Full    Dental no notable dental hx. (+) Teeth Intact   Pulmonary neg sleep apnea, COPD,  COPD inhaler, Patient abstained from smoking.Not current smoker, former smoker   Pulmonary exam normal breath sounds clear to auscultation       Cardiovascular Exercise Tolerance: Good hypertension, Pt. on medications (-) CAD and (-) Past MI (-) dysrhythmias  Rhythm:Regular Rate:Normal - Systolic murmurs    Neuro/Psych  Headaches PSYCHIATRIC DISORDERS Anxiety Depression     Neuromuscular disease    GI/Hepatic PUD,GERD  Controlled and Medicated,,(+)     (-) substance abuse    Endo/Other  neg diabetes  Class 3 obesity  Renal/GU      Musculoskeletal   Abdominal   Peds  Hematology   Anesthesia Other Findings After an in depth conversation over 10 minutes with the patient about a nerve block in her leg, patient declined. Patient talked to the surgeon and changed her mind. After scanning patient's thigh for an adductor canal block, no pertinent anatomy was seen and block was canceled.   Past Medical History: 06/04/2016: AKI (acute kidney injury) (HCC) No date:  Anxiety 06/04/2016: Bacteremia 03/01/2020: Choledocholithiasis 03/02/2020: Cholelithiasis No date: COPD (chronic obstructive pulmonary disease) (HCC) No date: Depression 06/04/2016: Diarrhea No date: History of kidney stones No date: Hypertension 06/04/2016: Hypokalemia No date: IBS (irritable bowel syndrome) 06/04/2016: Nausea and vomiting 06/04/2016: Positive blood culture 10/04/2015: Rectal fissure 09/22/2019: Surgery, elective  Reproductive/Obstetrics                              Anesthesia Physical Anesthesia Plan  ASA: 3  Anesthesia Plan: General ETT   Post-op Pain Management:    Induction: Intravenous  PONV Risk Score and Plan: 3 and Ondansetron , Dexamethasone  and Midazolam   Airway Management Planned: Oral ETT  Additional Equipment:   Intra-op Plan:   Post-operative Plan: Extubation in OR  Informed Consent: I have reviewed the patients History and Physical, chart, labs and discussed the procedure including the risks, benefits and alternatives for the proposed anesthesia with the patient or authorized representative who has indicated his/her understanding and acceptance.     Dental Advisory Given  Plan Discussed with: Anesthesiologist, CRNA and Surgeon  Anesthesia Plan Comments: (Patient consented for risks of anesthesia including but not limited to:  - adverse reactions to medications - damage to eyes, teeth, lips or other oral mucosa - nerve damage due to positioning  - sore throat or hoarseness - Damage to heart, brain, nerves, lungs, other parts of body or loss of life  Patient voiced understanding and assent.)         Anesthesia Quick Evaluation

## 2024-06-09 ENCOUNTER — Encounter: Payer: Self-pay | Admitting: Orthopedic Surgery

## 2024-06-16 ENCOUNTER — Other Ambulatory Visit: Payer: Self-pay | Admitting: Family Medicine

## 2024-06-16 DIAGNOSIS — I10 Essential (primary) hypertension: Secondary | ICD-10-CM

## 2024-06-29 ENCOUNTER — Other Ambulatory Visit: Payer: Self-pay

## 2024-06-29 ENCOUNTER — Ambulatory Visit: Payer: Self-pay

## 2024-06-29 ENCOUNTER — Emergency Department: Payer: MEDICAID

## 2024-06-29 ENCOUNTER — Encounter: Payer: Self-pay | Admitting: Emergency Medicine

## 2024-06-29 ENCOUNTER — Emergency Department
Admission: EM | Admit: 2024-06-29 | Discharge: 2024-06-29 | Disposition: A | Discharge status: Home / Self Care | Payer: MEDICAID | Attending: Emergency Medicine | Admitting: Emergency Medicine

## 2024-06-29 DIAGNOSIS — S8002XA Contusion of left knee, initial encounter: Secondary | ICD-10-CM

## 2024-06-29 DIAGNOSIS — J40 Bronchitis, not specified as acute or chronic: Secondary | ICD-10-CM | POA: Diagnosis not present

## 2024-06-29 DIAGNOSIS — W19XXXA Unspecified fall, initial encounter: Secondary | ICD-10-CM | POA: Diagnosis not present

## 2024-06-29 DIAGNOSIS — J449 Chronic obstructive pulmonary disease, unspecified: Secondary | ICD-10-CM | POA: Diagnosis not present

## 2024-06-29 DIAGNOSIS — R079 Chest pain, unspecified: Secondary | ICD-10-CM

## 2024-06-29 DIAGNOSIS — I10 Essential (primary) hypertension: Secondary | ICD-10-CM | POA: Diagnosis not present

## 2024-06-29 DIAGNOSIS — S8992XA Unspecified injury of left lower leg, initial encounter: Secondary | ICD-10-CM | POA: Diagnosis present

## 2024-06-29 LAB — HEPATIC FUNCTION PANEL
ALT: 11 U/L (ref 0–44)
AST: 19 U/L (ref 15–41)
Albumin: 4 g/dL (ref 3.5–5.0)
Alkaline Phosphatase: 96 U/L (ref 38–126)
Bilirubin, Direct: 0.2 mg/dL (ref 0.0–0.2)
Indirect Bilirubin: 0.2 mg/dL — ABNORMAL LOW (ref 0.3–0.9)
Total Bilirubin: 0.3 mg/dL (ref 0.0–1.2)
Total Protein: 6.5 g/dL (ref 6.5–8.1)

## 2024-06-29 LAB — CBC
HCT: 38.4 % (ref 36.0–46.0)
Hemoglobin: 12.6 g/dL (ref 12.0–15.0)
MCH: 29.3 pg (ref 26.0–34.0)
MCHC: 32.8 g/dL (ref 30.0–36.0)
MCV: 89.3 fL (ref 80.0–100.0)
Platelets: 334 K/uL (ref 150–400)
RBC: 4.3 MIL/uL (ref 3.87–5.11)
RDW: 13.3 % (ref 11.5–15.5)
WBC: 5 K/uL (ref 4.0–10.5)
nRBC: 0 % (ref 0.0–0.2)

## 2024-06-29 LAB — RESP PANEL BY RT-PCR (RSV, FLU A&B, COVID)  RVPGX2
Influenza A by PCR: NEGATIVE
Influenza B by PCR: NEGATIVE
Resp Syncytial Virus by PCR: NEGATIVE
SARS Coronavirus 2 by RT PCR: NEGATIVE

## 2024-06-29 LAB — BASIC METABOLIC PANEL WITH GFR
Anion gap: 13 (ref 5–15)
BUN: 8 mg/dL (ref 6–20)
CO2: 23 mmol/L (ref 22–32)
Calcium: 9.1 mg/dL (ref 8.9–10.3)
Chloride: 103 mmol/L (ref 98–111)
Creatinine, Ser: 0.74 mg/dL (ref 0.44–1.00)
GFR, Estimated: >60 mL/min (ref >60)
Glucose, Bld: 94 mg/dL (ref 70–99)
Potassium: 3 mmol/L — ABNORMAL LOW (ref 3.5–5.1)
Sodium: 139 mmol/L (ref 135–145)

## 2024-06-29 LAB — LIPASE, BLOOD: Lipase: 41 U/L (ref 11–51)

## 2024-06-29 LAB — TROPONIN T, HIGH SENSITIVITY: Troponin T High Sensitivity: <15 ng/L (ref 0–19)

## 2024-06-29 MED ORDER — DICLOFENAC SODIUM 1 % EX GEL: 4.0000 g | Freq: Four times a day (QID) | CUTANEOUS | 1 refills | Status: AC

## 2024-06-29 MED ORDER — IOHEXOL 350 MG/ML SOLN
75.0000 mL | Freq: Once | INTRAVENOUS | Status: AC | PRN
  Administered 2024-06-29: 13:00:00 75 mL via INTRAVENOUS

## 2024-06-29 MED ORDER — POTASSIUM CHLORIDE CRYS ER 20 MEQ PO TBCR
40.0000 meq | EXTENDED_RELEASE_TABLET | Freq: Once | ORAL | Status: AC
  Administered 2024-06-29: 12:00:00 40 meq via ORAL
  Filled 2024-06-29: qty 2

## 2024-06-29 MED ORDER — MORPHINE SULFATE (PF) 4 MG/ML IV SOLN
4.0000 mg | Freq: Once | INTRAVENOUS | Status: AC
  Administered 2024-06-29: 12:00:00 4 mg via INTRAVENOUS
  Filled 2024-06-29: qty 1

## 2024-06-29 MED ORDER — OXYCODONE-ACETAMINOPHEN 5-325 MG PO TABS: 1.0000 | ORAL_TABLET | ORAL | 0 refills | Status: DC | PRN

## 2024-06-29 MED ORDER — OXYCODONE-ACETAMINOPHEN 5-325 MG PO TABS
1.0000 | ORAL_TABLET | Freq: Once | ORAL | Status: AC
  Administered 2024-06-29: 13:00:00 1 via ORAL
  Filled 2024-06-29: qty 1

## 2024-06-29 NOTE — Telephone Encounter (Signed)
 FYI Only or Action Required?: FYI only for provider: ED advised.  Patient was last seen in primary care on 05/28/2024 by Norma [name], Norma K, MD.  Called Nurse Triage reporting Chest Pain.  Symptoms began today.  Interventions attempted: Nothing.  Symptoms are: gradually worsening.  Triage Disposition: Go to ED Now (Notify PCP)  Patient/caregiver understands and will follow disposition?: Yes         Copied from CRM [id number]. Topic: Clinical - Red Word Triage >> Jun 29, 2024 10:15 AM Tobias L wrote: Red Word that prompted transfer to Nurse Triage: patient states she has a really bad headache, sick to her stomach, just had knee surgery and in a lot of pain.   Requesting hydrocodone  for headache Reason for Disposition  SEVERE chest pain  Answer Assessment - Initial Assessment Questions 1. LOCATION: Where does it hurt?       Mid chest   2. RADIATION: Does the pain go anywhere else? (e.g., into neck, jaw, arms, back)     Back   3. ONSET: When did the chest pain begin? (Minutes, hours or days)      This morning   4. PATTERN: Does the pain come and go, or has it been constant since it started?  Does it get worse with exertion?      Constant   5. DURATION: How long does it last (e.g., seconds, minutes, hours)     Hours   6. SEVERITY: How bad is the pain?  (e.g., Scale 1-10; mild, moderate, or severe)     9/10   7. CARDIAC RISK FACTORS: Do you have any history of heart problems or risk factors for heart disease? (e.g., angina, prior heart attack; diabetes, high blood pressure, high cholesterol, smoker, or strong family history of heart disease)      HTN  8. PULMONARY RISK FACTORS: Do you have any history of lung disease?  (e.g., blood clots in lung, asthma, emphysema, birth control pills)     COPD    9. CAUSE: What do you think is causing the chest pain?     Unsure    10. OTHER SYMPTOMS: Do you have any other symptoms? (e.g., dizziness, nausea,  vomiting, sweating, fever, difficulty breathing, cough)  Vomiting, dizziness, chills, SOB.   Patient called in with initial complaints of headache, and vomiting, with knee pain. Upon further triage patient reported severe chest pain that began this morning. She also mentioned she is experiencing Vomiting, dizziness, chills, SOB. Based upon those symptoms, she was referred to the ED. Patient agrees with plan of care, and will call back once discharged.  Answer Assessment - Initial Assessment Questions 1. LOCATION: Where does it hurt?        2. ONSET: When did the headache start? (e.g., minutes, hours, days)        3. PATTERN: Does the pain come and go, or has it been constant since it started?       4. SEVERITY: How bad is the pain? and What does it keep you from doing?  (e.g., Scale 1-10; mild, moderate, or severe)       5. RECURRENT SYMPTOM: Have you ever had headaches before? If Yes, ask: When was the last time? and What happened that time?        6. CAUSE: What do you think is causing the headache?       7. MIGRAINE: Have you been diagnosed with migraine headaches? If Yes, ask: Is this headache similar?  8. HEAD INJURY: Has there been any recent injury to your head?        9. OTHER SYMPTOMS: Do you have any other symptoms? (e.g., fever, stiff neck, eye pain, sore throat, cold symptoms)  Protocols used: Headache-A-AH, Chest Pain-A-AH

## 2024-06-29 NOTE — ED Triage Notes (Signed)
 Pt to ED via POV from home. Pt has multiple medical complaints. Pt is c/o central chest pain that was hurting when she woke up this morning. Pt states that the pain radiates into her back. Pt is also c/o nausea since waking up this morning as well. Pt states that she has had a headache since yesterday. Pt took tylenol  without relief. Pt states that she had surgery on her left knee on 11/25. Pt fell 3 days ago and is c/o increased pain in her knee. Pt is currently in NAD.

## 2024-06-29 NOTE — ED Provider Notes (Signed)
 [facility] Provider Note    Event Date/Time   First MD Initiated Contact with Patient 06/29/24 1131     (approximate)   History   Chief Complaint Chest Pain, Headache, Knee Pain, and Nausea   HPI  Norma [name] is a 48 y.o. female with past medical history of hypertension, COPD, iron deficiency anemia, and anxiety who presents to the ED complaining of chest pain.  Patient reports that she woke up this morning with sharp pain in the center of her chest that is worse when she coughs.  She does report having a dry cough for the past couple of days with some nausea and occasional vomiting.  She is not aware of any fevers and denies any diarrhea or dysuria, does report feeling short of breath at times.  She recently had surgery on her knee and describes worsening pain in the knee after she fell onto it a couple of days ago.  She has not noticed any redness or swelling to the knee or leg.     Physical Exam   Triage Vital Signs: ED Triage Vitals  Encounter Vitals Group     BP 06/29/24 1113 (!) 138/91     Girls Systolic BP Percentile --      Girls Diastolic BP Percentile --      Boys Systolic BP Percentile --      Boys Diastolic BP Percentile --      Pulse Rate 06/29/24 1112 77     Resp 06/29/24 1112 16     Temp 06/29/24 1112 98 F (36.7 C)     Temp Source 06/29/24 1112 Oral     SpO2 06/29/24 1112 99 %     Weight 06/29/24 1111 200 lb (90.7 kg)     Height 06/29/24 1111 5' 2 (1.575 m)     Head Circumference --      Peak Flow --      Pain Score 06/29/24 1111 9     Pain Loc --      Pain Education --      Exclude from Growth Chart --     Most recent vital signs: Vitals:   06/29/24 1112 06/29/24 1113  BP:  (!) 138/91  Pulse: 77   Resp: 16   Temp: 98 F (36.7 C)   SpO2: 99%     Constitutional: Alert and oriented. Eyes: Conjunctivae are normal. Head: Atraumatic. Nose: No congestion/rhinnorhea. Mouth/Throat: Mucous membranes are moist.   Cardiovascular: Normal rate, regular rhythm. Grossly normal heart sounds.  2+ radial and DP pulses bilaterally. Respiratory: Normal respiratory effort.  No retractions. Lungs CTAB.  No chest wall tenderness to palpation. Gastrointestinal: Soft and nontender. No distention. Musculoskeletal: Surgical sites to left knee are well-healed without erythema, warmth, or edema.  Range of motion to left knee is intact with moderate pain.  No calf erythema, edema, or tenderness noted. Neurologic:  Normal speech and language. No gross focal neurologic deficits are appreciated.    ED Results / Procedures / Treatments   Labs (all labs ordered are listed, but only abnormal results are displayed) Labs Reviewed  BASIC METABOLIC PANEL WITH GFR - Abnormal; Notable for the following components:      Result Value   Potassium 3.0 (*)    All other components within normal limits  HEPATIC FUNCTION PANEL - Abnormal; Notable for the following components:   Indirect Bilirubin 0.2 (*)    All other components within normal limits  RESP PANEL BY RT-PCR (  RSV, FLU A&B, COVID)  RVPGX2  CBC  LIPASE, BLOOD  TROPONIN T, HIGH SENSITIVITY     EKG  ED ECG REPORT I, Carlin Palin, the attending physician, personally viewed and interpreted this ECG.   Date: 06/29/2024  EKG Time: 11:09  Rate: 79  Rhythm: normal sinus rhythm  Axis: Normal  Intervals:none  ST&T Change: None  RADIOLOGY Left knee x-ray reviewed and interpreted by me with no fracture or dislocation.  PROCEDURES:  Critical Care performed: No  Procedures   MEDICATIONS ORDERED IN ED: Medications  morphine  (PF) 4 MG/ML injection 4 mg (4 mg Intravenous Given 06/29/24 1210)  potassium chloride  SA (KLOR-CON  M) CR tablet 40 mEq (40 mEq Oral Given 06/29/24 1211)  iohexol  (OMNIPAQUE ) 350 MG/ML injection 75 mL (75 mLs Intravenous Contrast Given 06/29/24 1230)  oxyCODONE -acetaminophen  (PERCOCET/ROXICET) 5-325 MG per tablet 1 tablet (1 tablet Oral  Given 06/29/24 1329)     IMPRESSION / MDM / ASSESSMENT AND PLAN / ED COURSE  I reviewed the triage vital signs and the nursing notes.                              48 y.o. female with past medical history of hypertension, COPD, iron deficiency anemia, and anxiety who presents to the ED complaining of sharp pain in her chest with mild difficulty breathing since this morning preceded by cough for a couple of days.  Patient's presentation is most consistent with acute presentation with potential threat to life or bodily function.  Differential diagnosis includes, but is not limited to, ACS, PE, pneumonia, pneumothorax, musculoskeletal pain, GERD, anxiety.  Patient nontoxic-appearing and in no acute distress, vital signs are unremarkable.  She is not in any respiratory distress and maintaining oxygen  saturations at 99% on room air.  EKG shows no evidence of arrhythmia or ischemia, troponin within normal limits, and symptoms seem atypical for ACS.  Given her recent knee surgery, will check CTA chest to rule out PE.  Viral illness also considered and will check viral panel.  Additional labs with mild hypokalemia but no significant anemia, leukocytosis, or AKI.  Plan to treat pain with IV morphine  and reassess following CTA chest as well as x-ray of her left knee.  CTA chest is negative for PE or other acute finding, left knee x-ray is also unremarkable.  Viral panel is negative but suspect bronchitis as the source of her cough and chest discomfort.  She is appropriate for discharge home with PCP follow-up, was counseled to return to the ED for new or worsening symptoms.  Patient agrees with plan.      FINAL CLINICAL IMPRESSION(S) / ED DIAGNOSES   Final diagnoses:  Chest pain, unspecified type  Bronchitis  Contusion of left knee, initial encounter     Rx / DC Orders   ED Discharge Orders          Ordered    oxyCODONE -acetaminophen  (PERCOCET) 5-325 MG tablet  Every 4 hours PRN         06/29/24 1355             Note:  This document was prepared using Dragon voice recognition software and may include unintentional dictation errors.   Palin Carlin, MD 06/29/24 1356

## 2024-06-30 ENCOUNTER — Telehealth: Payer: Self-pay | Admitting: Family Medicine

## 2024-06-30 ENCOUNTER — Ambulatory Visit: Payer: Self-pay | Admitting: Family Medicine

## 2024-06-30 NOTE — Telephone Encounter (Signed)
 See Mychart.

## 2024-06-30 NOTE — Telephone Encounter (Signed)
 Copied from CRM #[id number]. Topic: Clinical - Medication Question >> Jun 30, 2024 10:51 AM Alfonso ORN wrote: Reason for CRM: pt had a ER visit yesterday and would like to know if she could get medication for bad headaches over past few days. Pt mentioned previous seizure . Pt refused appt at this time. Please call back to advise
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
## 2024-06-30 NOTE — Telephone Encounter (Signed)
 Patient called and has been scheduled for 12/19 @ 8:30 am. Please prescribe her medication for headache.

## 2024-07-01 ENCOUNTER — Encounter: Payer: Self-pay | Admitting: Family Medicine

## 2024-07-01 ENCOUNTER — Ambulatory Visit (INDEPENDENT_AMBULATORY_CARE_PROVIDER_SITE_OTHER): Payer: MEDICAID | Admitting: Family Medicine

## 2024-07-01 VITALS — BP 138/84 | HR 88 | Resp 16 | Ht 62.0 in | Wt 204.0 lb

## 2024-07-01 DIAGNOSIS — I10 Essential (primary) hypertension: Secondary | ICD-10-CM

## 2024-07-01 DIAGNOSIS — G43701 Chronic migraine without aura, not intractable, with status migrainosus: Secondary | ICD-10-CM | POA: Diagnosis not present

## 2024-07-01 MED ORDER — SPIRONOLACTONE 25 MG PO TABS: 25.0000 mg | ORAL_TABLET | Freq: Every day | ORAL | 3 refills | Status: AC

## 2024-07-01 MED ORDER — ONDANSETRON 4 MG PO TBDP: 4.0000 mg | ORAL_TABLET | Freq: Three times a day (TID) | ORAL | 0 refills | Status: DC | PRN

## 2024-07-01 MED ORDER — SUMATRIPTAN SUCCINATE 100 MG PO TABS: ORAL_TABLET | ORAL | 0 refills | Status: DC

## 2024-07-01 MED ORDER — KETOROLAC TROMETHAMINE 60 MG/2ML IM SOLN
60.0000 mg | Freq: Once | INTRAMUSCULAR | Status: AC
  Administered 2024-07-01: 11:00:00 60 mg via INTRAMUSCULAR

## 2024-07-01 MED ADMIN — Ondansetron Orally Disintegrating Tab 8 MG: 8 mg | ORAL | @ 11:00:00 | NDC 65862039110

## 2024-07-01 MED ADMIN — Sumatriptan Succinate Inj 6 MG/0.5ML: 6 mg | SUBCUTANEOUS | @ 11:00:00 | NDC 55150017301
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
## 2024-07-02 ENCOUNTER — Telehealth: Payer: Self-pay | Admitting: Family Medicine

## 2024-07-02 ENCOUNTER — Ambulatory Visit: Payer: MEDICAID | Admitting: Family Medicine

## 2024-07-02 NOTE — Telephone Encounter (Signed)
 Copied from CRM #[id number]. Topic: Clinical - Medication Refill >> Jul 02, 2024 12:47 PM Avram G wrote: Medication: ibuprofen  (ADVIL ) 800 MG tablet [[id number]] ENDED HYDROcodone -acetaminophen  (NORCO/VICODIN) 5-325 MG per tablet 1 tablet   [[id number]]   Has the patient contacted their pharmacy? Yes (Agent: If no, request that the patient contact the pharmacy for the refill. If patient does not wish to contact the pharmacy document the reason why and proceed with request.) (Agent: If yes, when and what did the pharmacy advise?)  This is the patient's preferred pharmacy:  [facility] - [place], [place] - [address] [place] [address] Phone: [phone] Fax: [phone]  Is this the correct pharmacy for this prescription? Yes If no, delete pharmacy and type the correct one.   Has the prescription been filled recently? No  Is the patient out of the medication? Yes  Has the patient been seen for an appointment in the last year OR does the patient have an upcoming appointment? Yes  Can we respond through MyChart? Yes  Agent: Please be advised that Rx refills may take up to 3 business days. We ask that you follow-up with your pharmacy.

## 2024-07-03 ENCOUNTER — Emergency Department
Admission: EM | Admit: 2024-07-03 | Discharge: 2024-07-03 | Disposition: A | Discharge status: Home / Self Care | Payer: MEDICAID

## 2024-07-03 ENCOUNTER — Other Ambulatory Visit: Payer: Self-pay

## 2024-07-03 ENCOUNTER — Emergency Department: Payer: MEDICAID

## 2024-07-03 ENCOUNTER — Encounter: Payer: Self-pay | Admitting: Emergency Medicine

## 2024-07-03 DIAGNOSIS — F17200 Nicotine dependence, unspecified, uncomplicated: Secondary | ICD-10-CM | POA: Insufficient documentation

## 2024-07-03 DIAGNOSIS — M25562 Pain in left knee: Secondary | ICD-10-CM | POA: Diagnosis not present

## 2024-07-03 DIAGNOSIS — I10 Essential (primary) hypertension: Secondary | ICD-10-CM | POA: Insufficient documentation

## 2024-07-03 DIAGNOSIS — M79605 Pain in left leg: Secondary | ICD-10-CM | POA: Diagnosis present

## 2024-07-03 MED ORDER — TRAMADOL HCL 50 MG PO TABS: 50.0000 mg | ORAL_TABLET | Freq: Four times a day (QID) | ORAL | 0 refills | Status: DC | PRN

## 2024-07-03 MED ORDER — KETOROLAC TROMETHAMINE 10 MG PO TABS: 10.0000 mg | ORAL_TABLET | Freq: Four times a day (QID) | ORAL | 0 refills | Status: DC | PRN

## 2024-07-03 MED ORDER — TRAMADOL HCL 50 MG PO TABS
50.0000 mg | ORAL_TABLET | Freq: Once | ORAL | Status: AC
  Administered 2024-07-03: 50 mg via ORAL
  Filled 2024-07-03: qty 1

## 2024-07-03 NOTE — ED Notes (Signed)
 Attempted to locate pt to discuss discharge paperwork. Pt not found. Attempted to call pt to listed number  [phone] with no answer x2

## 2024-07-03 NOTE — ED Provider Notes (Signed)
 "  [facility] Provider Note    Event Date/Time   First MD Initiated Contact with Patient 07/03/24 1641     (approximate)   History   Knee Pain    HPI  Norma [name] is a 48 y.o. female    with a past medical history of ACL reconstruction, left knee pain, spinal stenosis, lumbar region, chest pain, headache, rupture of anterior cruciate ligament, tendon, chronic likely,  who presents to the ED complaining of knee pain. According to the patient, she had ACL reconstruction of the left knee a month ago, patient was taking oxycodone  for pain.  2 days ago symptoms started with left calf pain and left knee pain.  Patient is doing therapy.  Patient is taking ibuprofen  without resolution of the pain.  Patient denies fever, chest pain, abdominal pain, wheezing, diarrhea or urinary symptoms.  Patient is no walking as usual.  Patient is going to have orthopedics follow-up on December 23.  Patient is here by herself.     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) 01/01/2024   Psychosis (HCC) 11/19/2023   Moderate episode of recurrent major depressive disorder (HCC) 05/26/2023   Generalized anxiety disorder 05/26/2023   Iron deficiency anemia 05/26/2023   Migraine 05/26/2023   Vitamin D  deficiency 05/26/2023   Spinal stenosis of lumbar region 04/10/2023   Lumbar radiculopathy 04/10/2023   Anxiety about treatment 04/01/2023   Encounter for drug screening 03/25/2023   Chronic prescription benzodiazepine use 03/25/2023   Duodenal ulcer    Gastric erosion    Class 1  obesity due to excess calories without serious comorbidity with body mass index (BMI) of 34.0 to 34.9 in adult    Tobacco abuse    Chronic obstructive pulmonary disease (HCC)    HTN (hypertension) 03/01/2020   Endometriosis of pelvis 11/16/2015     Physical Exam   Triage Vital Signs: ED Triage Vitals [07/03/24 1413]  Encounter Vitals Group     BP (!) 127/107     Girls Systolic BP Percentile      Girls Diastolic BP Percentile      Boys Systolic BP Percentile      Boys Diastolic BP Percentile      Pulse Rate 82     Resp 18     Temp 98.3 F (36.8 C)     Temp Source Oral     SpO2 93 %     Weight 203 lb 14.8 oz (92.5 kg)     Height 5' 2 (1.575 m)     Head Circumference      Peak Flow      Pain Score 9     Pain Loc      Pain Education      Exclude from Growth Chart     Most recent vital signs: Vitals:   07/03/24 1413  BP: (!) 127/107  Pulse: 82  Resp: 18  Temp: 98.3 F (36.8 C)  SpO2: 93%     Physical Exam Vitals and nursing note reviewed.   General:  Awake, no distress.  CV:                  Good peripheral perfusion. Regular rate and rhythm. Resp:               Normal effort. no tachypnea.Equal breath sounds bilaterally.  Abd:                 No distention.  Soft nontender Other:              Left lower extremity: Presence of knee immobilization.  Tenderness to palpation in the calf.  Tenderness to palpation in the medial patellar area.  Pulses positive.  No edema. ED Results / Procedures / Treatments   Labs (all labs ordered are listed, but only abnormal results are displayed) Labs Reviewed - No data to display    RADIOLOGY I independently reviewed and interpreted imaging and agree with radiologists findings.      PROCEDURES:  Critical Care performed:   Procedures   MEDICATIONS ORDERED IN ED: Medications  traMADol  (ULTRAM ) tablet 50 mg (50 mg Oral Given 07/03/24 1810)   Clinical Course as of 07/03/24 1958  Sat Jul 03, 2024  1729 DG  Knee Complete 4 Views Left Postoperative changes from ACL repair. No acute findings. [AE]  1815 Per nurse patient declined tramadol , she was requesting oxycodone  [AE]  1931 US  Venous Img Lower  Left (DVT Study) Negative. [AE]    Clinical Course User Index [AE] Janit Kast, PA-C    IMPRESSION / MDM / ASSESSMENT AND PLAN / ED COURSE  I reviewed the triage vital signs and the nursing notes.  Differential diagnosis includes, but is not limited to, DVT, muscle strain, knee effusion, knee fracture, septic arthritis..  Patient's presentation is most consistent with acute complicated illness / injury requiring diagnostic workup.   Norma [name] is a 48 y.o., female who presents today with history of 2 days of left lower extremity.  ACL reconstruction a month ago.  See HPI for further information.  On a physical exam vital signs were normal during triage.  Left lower extremity presence of knee immobilization.  Tenderness to palpation in the calf, tenderness to palpation in the left medial patellar area.  Pulses positive, no edema. X-ray of the knee was negative for acute findings.  No knee effusion.  Postoperative changes from ACL repair. Plan US  left lower extremity Tramadol  Reassess US  left lower extremity came back negative for DVT.  Patient's diagnosis is consistent with left knee pain. I independently reviewed and interpreted imaging and agree with radiologists findings. I did review the patient's allergies and medications.The patient is in stable and satisfactory condition for discharge home  Patient will be discharged home with prescriptions for tramadol . Patient is to follow up with orthopedics as needed or otherwise directed. Patient is given ED precautions to return to the ED for any worsening or new symptoms. Discussed plan of care with patient, answered all of patient's questions, patient agreeable to plan of care. Advised patient to take medications according to the  instructions on the label. Discussed possible side effects of new medications. Patient verbalized understanding.  FINAL CLINICAL IMPRESSION(S) / ED DIAGNOSES   Final diagnoses:  Left knee pain, unspecified chronicity     Rx / DC Orders   ED Discharge Orders          Ordered    traMADol  (ULTRAM ) 50 MG tablet  Every 6 hours PRN        07/03/24  1957    ketorolac  (TORADOL ) 10 MG tablet  Every 6 hours PRN       Note to Pharmacy: Patient given an IM/IV loading dose in emergency department   07/03/24 1957             Note:  This document was prepared using Dragon voice recognition software and may include unintentional dictation errors.   Janit Kast, PA-C 07/03/24 1958  "

## 2024-07-03 NOTE — Discharge Instructions (Addendum)
 You have been diagnosed with left knee pain, x-ray of the left knee are negative for fracture, dislocation, or fluid in the joint.  Ultrasound came back negative for venous thrombosis.  Please take tramadol  1 tablet by mouth every 6 hours.  Please go to your appointment with orthopedics on December 23.  Please come up to ED with your PCP you have new symptoms symptoms worsen.  Please take Toradol  1 tablet by mouth every 6 hours as needed for pain.

## 2024-07-03 NOTE — ED Provider Triage Note (Signed)
 Emergency Medicine Provider Triage Evaluation Note  Norma [name] , a 48 y.o. female  was evaluated in triage.  Pt complains of postoperative left knee pain. Surgery was late November and pain isn't improving. She has fallen a couple of times but hasn't been seen since. Follow up scheduled for Dec. 23   Physical Exam  LMP 09/24/2015  Gen:   Awake, no distress   Resp:  Normal effort  MSK:   Moves extremities without difficulty  Other:    Medical Decision Making  Medically screening exam initiated at 2:12 PM.  Appropriate orders placed.  Norma [name] was informed that the remainder of the evaluation will be completed by another provider, this initial triage assessment does not replace that evaluation, and the importance of remaining in the ED until their evaluation is complete.    Norma Kirk NOVAK, FNP 07/03/24 1414

## 2024-07-03 NOTE — ED Triage Notes (Signed)
 Pt had left knee surgery end of November and still has pain. Pt reports multiple falls since the surgery. States she has not been able to be seen by careers adviser.

## 2024-07-05 ENCOUNTER — Telehealth: Payer: Self-pay | Admitting: Family Medicine

## 2024-07-05 ENCOUNTER — Ambulatory Visit: Payer: Self-pay

## 2024-07-05 ENCOUNTER — Ambulatory Visit: Payer: MEDICAID | Admitting: Family Medicine

## 2024-07-05 ENCOUNTER — Ambulatory Visit: Payer: Self-pay | Admitting: *Deleted

## 2024-07-05 NOTE — Telephone Encounter (Signed)
 Copied from CRM [id number]. Topic: Clinical - Red Word Triage >> Jul 05, 2024 11:27 AM [name] wrote: Red Word that prompted transfer to Nurse Triage: Severe Head pain and knee pain, going on for a few days. Would like pcp to call in meds. >> Jul 05, 2024  2:12 PM Montie POUR wrote: Alliya is calling back to see if pain medication has been called in for her. I don't see anything in chart >> Jul 05, 2024  1:01 PM Anastaisa Wooding wrote: Patient calling back to see if any pain meds will be called in today  Patient has returned call- the migraine medication is not working- sumatriptan - patient has taken 2 doses. Patient was given appointment tomorrow- but patient wants to be seen today. Only open appointment is 2:50- patient insists she can be at the office on time. Patient advised hesitant to make appointment- but she states she will make the appointment

## 2024-07-05 NOTE — Telephone Encounter (Signed)
 Copied from CRM [id number]. Topic: General - Other >> Jul 05, 2024  2:33 PM Rosina BIRCH wrote: Reason for CRM: Rosina from [facility] pharmacy called wanting to know if the provider called in a pain medication for the patient or if she plan on calling in a pain medication for her

## 2024-07-05 NOTE — Telephone Encounter (Signed)
 FYI Only or Action Required?: Action required by provider: clinical question for provider and update on patient condition.  Patient was last seen in primary care on 07/01/2024 by Norma Small, FNP.  Called Nurse Triage reporting Headache and Knee Pain.  Symptoms began several days ago.  Interventions attempted: OTC medications: tylenol ; ibuprofen , Prescription medications: toradol ; tramadol , and Ice/heat application.  Symptoms are: unchanged.  Triage Disposition: See PCP When Office is Open (Within 3 Days)  Patient/caregiver understands and will follow disposition?: Yes   Copied from CRM #[id number]. Topic: Clinical - Red Word Triage >> Jul 05, 2024 11:27 AM [name] wrote: Red Word that prompted transfer to Nurse Triage: Severe Head pain and knee pain, going on for a few days. Would like pcp to call in meds.      Reason for Disposition  [1] MILD-MODERATE headache AND [2] present > 3 days (72 hours) AND [3] no improvement after using Care Advice  Answer Assessment - Initial Assessment Questions Pt called in requesting alternative medications be sent in before scheduled appt 12/23. Pt reports headache and knee pain despite taking tramadol  and toradol . Pt is currently taking ibuprofen  and tylenol , applying cold packs to knee without much relief. Pt does report hx of knee surgery but denies fever or swelling of joint. Discussed appt with PCP 12/23 but pt prefers to have rx sent before. Please advise.     1. LOCATION: Where does it hurt?      Headache; knee pain   2. ONSET: When did the headache start? (e.g., minutes, hours, days)      Ongoing; seen in ED 12/20                4. SEVERITY: How bad is the pain? and What does it keep you from doing?  (e.g., Scale 1-10; mild, moderate, or severe)     7/10  5. RECURRENT SYMPTOM: Have you ever had headaches before? If Yes, ask: When was the last time? and What happened that time?      Yes; ED prescribed tramadol  and  toradol  but pt reports no relief   6. CAUSE: What do you think is causing the headache?     Unknown   8. HEAD INJURY: Has there been any recent injury to your head?      No   9. OTHER SYMPTOMS: Do you have any other symptoms? (e.g., fever, stiff neck, eye pain, sore throat, cold symptoms)     Denies any other symptoms  Protocols used: Headache-A-AH

## 2024-07-06 ENCOUNTER — Ambulatory Visit: Payer: MEDICAID | Admitting: Family Medicine

## 2024-07-06 ENCOUNTER — Encounter: Payer: Self-pay | Admitting: Family Medicine

## 2024-07-06 VITALS — BP 114/81 | HR 87 | Resp 16 | Ht 62.0 in | Wt 206.0 lb

## 2024-07-06 DIAGNOSIS — R519 Headache, unspecified: Secondary | ICD-10-CM | POA: Diagnosis not present

## 2024-07-06 MED ORDER — UBRELVY 100 MG PO TABS: ORAL_TABLET | ORAL | 1 refills | Status: DC

## 2024-07-06 NOTE — Progress Notes (Signed)
" ° °  Established Patient Office Visit  Subjective   Patient ID: Norma [name], female    DOB: 10/14/1975  Age: 48 y.o. MRN: [id number]  Chief Complaint  Patient presents with   Headache    HPI Norma [name] is a delightful 48 year old with hypertension, COPD, history of duodenal ulcer, lumbar radiculopathy, acute subdural hematoma, ACL rupture left knee. Obesity, depression and anxiety, vitamijn D deficiency, hx psychosis.  Review of PDMP chart shows that she gotten oxycodone  5 mg #30 on 12/1, oxycodone  5 mg #60 on 12/3, oxycodone  5 mg #60 12/8.  So she had #150 oxycodone  in the last 3 weeks.  She was seen by ortho today and she was given a medrol  dose pack and etodolac.    She is complaining of a migraine headache that she has had for the last 4 days.  She reports that Imitrex  does her no good.  Toradol  is no good either.  She has taken Topamax in the past and did not feel that it helped.  She has used Ubrelvy  in the past with some success but has had difficulty getting her insurance to cover this medication.    She is also complaining of knee pain.  She just had an Biochemist, Clinical.  She is still following with psychiatry and reports that this relationship is not good.  Further inquiry is that her psychiatrist wants her off benzodiazepine    Objective:     BP 114/81   Pulse 87   Resp 16   Ht 5' 2 (1.575 m)   Wt 206 lb (93.4 kg)   LMP 09/24/2015   SpO2 97%   BMI 37.68 kg/m    Physical Exam Vitals reviewed.  Constitutional:      Appearance: Normal appearance.  HENT:     Head: Normocephalic.  Eyes:     General:        Right eye: No discharge.        Left eye: No discharge.  Cardiovascular:     Rate and Rhythm: Normal rate.  Pulmonary:     Effort: Pulmonary effort is normal.  Neurological:     Mental Status: She is alert and oriented to person, place, and time.  Psychiatric:        Mood and Affect: Mood normal.        Behavior: Behavior normal.        Thought Content:  Thought content normal.        Judgment: Judgment normal.          No results found for any visits on 07/06/24.    The 10-year ASCVD risk score (Arnett DK, et al., 2019) is: 1.7%    Assessment & Plan:  Acute nonintractable headache, unspecified headache type Assessment & Plan: Offered her Toradol  injection and an Imitrex  injection.  Will write for Ubrelvy  and see if we can get it paid for.  She will except the Toradol .  She is requesting narcotics for her headache.  Advised cannot fill narcotics because she has had 155 mg oxycodone  in the last 3 weeks.  Orders: -     Ubrelvy ; Take 1 po headache.  May repeat in 2 hours if needed.  No more than 2 in 24 hours.  Dispense: 30 tablet; Refill: 1     Return if symptoms worsen or fail to improve.    Marzella Miracle K Thaxton Pelley, MD "

## 2024-07-08 ENCOUNTER — Emergency Department
Admission: EM | Admit: 2024-07-08 | Discharge: 2024-07-08 | Disposition: A | Discharge status: Home / Self Care | Payer: MEDICAID | Attending: Emergency Medicine | Admitting: Emergency Medicine

## 2024-07-08 ENCOUNTER — Other Ambulatory Visit: Payer: Self-pay

## 2024-07-08 ENCOUNTER — Emergency Department: Payer: MEDICAID

## 2024-07-08 DIAGNOSIS — D508 Other iron deficiency anemias: Secondary | ICD-10-CM | POA: Diagnosis not present

## 2024-07-08 DIAGNOSIS — N200 Calculus of kidney: Secondary | ICD-10-CM | POA: Insufficient documentation

## 2024-07-08 DIAGNOSIS — R0789 Other chest pain: Secondary | ICD-10-CM | POA: Diagnosis not present

## 2024-07-08 DIAGNOSIS — F17200 Nicotine dependence, unspecified, uncomplicated: Secondary | ICD-10-CM | POA: Insufficient documentation

## 2024-07-08 DIAGNOSIS — R1032 Left lower quadrant pain: Secondary | ICD-10-CM | POA: Diagnosis present

## 2024-07-08 DIAGNOSIS — J449 Chronic obstructive pulmonary disease, unspecified: Secondary | ICD-10-CM | POA: Insufficient documentation

## 2024-07-08 DIAGNOSIS — I10 Essential (primary) hypertension: Secondary | ICD-10-CM | POA: Insufficient documentation

## 2024-07-08 LAB — CBC
HCT: 36.4 % (ref 36.0–46.0)
Hemoglobin: 11.6 g/dL — ABNORMAL LOW (ref 12.0–15.0)
MCH: 29.1 pg (ref 26.0–34.0)
MCHC: 31.9 g/dL (ref 30.0–36.0)
MCV: 91.5 fL (ref 80.0–100.0)
Platelets: 293 K/uL (ref 150–400)
RBC: 3.98 MIL/uL (ref 3.87–5.11)
RDW: 13.3 % (ref 11.5–15.5)
WBC: 5.8 K/uL (ref 4.0–10.5)
nRBC: 0 % (ref 0.0–0.2)

## 2024-07-08 LAB — RESP PANEL BY RT-PCR (RSV, FLU A&B, COVID)  RVPGX2
Influenza A by PCR: NEGATIVE
Influenza B by PCR: NEGATIVE
Resp Syncytial Virus by PCR: NEGATIVE
SARS Coronavirus 2 by RT PCR: NEGATIVE

## 2024-07-08 LAB — BASIC METABOLIC PANEL WITH GFR
Anion gap: 12 (ref 5–15)
BUN: 17 mg/dL (ref 6–20)
CO2: 21 mmol/L — ABNORMAL LOW (ref 22–32)
Calcium: 8.5 mg/dL — ABNORMAL LOW (ref 8.9–10.3)
Chloride: 106 mmol/L (ref 98–111)
Creatinine, Ser: 0.7 mg/dL (ref 0.44–1.00)
GFR, Estimated: >60 mL/min
Glucose, Bld: 89 mg/dL (ref 70–99)
Potassium: 3.8 mmol/L (ref 3.5–5.1)
Sodium: 139 mmol/L (ref 135–145)

## 2024-07-08 LAB — TROPONIN T, HIGH SENSITIVITY
Troponin T High Sensitivity: <15 ng/L (ref 0–19)
Troponin T High Sensitivity: <15 ng/L (ref 0–19)

## 2024-07-08 MED ORDER — FERROUS SULFATE 300 (60 FE) MG/5ML PO SOLN: 300.0000 mg | Freq: Every day | ORAL | 0 refills | Status: AC

## 2024-07-08 MED ORDER — TRAMADOL HCL 50 MG PO TABS
50.0000 mg | ORAL_TABLET | Freq: Once | ORAL | Status: AC
  Administered 2024-07-08: 50 mg via ORAL
  Filled 2024-07-08: qty 1

## 2024-07-08 MED ORDER — TRAMADOL HCL 50 MG PO TABS: 50.0000 mg | ORAL_TABLET | Freq: Four times a day (QID) | ORAL | 0 refills | Status: DC | PRN

## 2024-07-08 MED ORDER — MORPHINE SULFATE (PF) 4 MG/ML IV SOLN
4.0000 mg | Freq: Once | INTRAVENOUS | Status: AC
  Administered 2024-07-08: 4 mg via INTRAVENOUS
  Filled 2024-07-08: qty 1

## 2024-07-08 MED ORDER — ONDANSETRON HCL 4 MG/2ML IJ SOLN
4.0000 mg | Freq: Once | INTRAMUSCULAR | Status: AC
  Administered 2024-07-08: 4 mg via INTRAVENOUS
  Filled 2024-07-08: qty 2

## 2024-07-08 MED ORDER — SODIUM CHLORIDE 0.9 % IV BOLUS
500.0000 mL | Freq: Once | INTRAVENOUS | Status: AC
  Administered 2024-07-08: 500 mL via INTRAVENOUS

## 2024-07-08 MED ORDER — ONDANSETRON 4 MG PO TBDP: 4.0000 mg | ORAL_TABLET | Freq: Three times a day (TID) | ORAL | 0 refills | Status: DC | PRN

## 2024-07-08 NOTE — Discharge Instructions (Addendum)
 Have been diagnosed with left nephrolithiasis, right chest wall pain.  Please drink plenty of fluids.  Please take tramadol  1 tablet by mouth every 6 hours as needed for pain.  Please take Zofran  1 tablet by mouth 20 minutes before main meals for vomiting.  Please take ferrous sulfate  for anemia, 5 mL daily after lunch.  Please call urology and make an appointment for a follow-up.  Please come back to ED or go to your PCP if you have new symptoms or symptoms worsen.

## 2024-07-08 NOTE — ED Provider Notes (Signed)
 "  [facility] Provider Note    Event Date/Time   First MD Initiated Contact with Patient 07/08/24 1615     (approximate)   History   Chest Pain    HPI  Norma [name] is a 48 y.o. female    with a past medical history of headache, left knee pain, chest pain, ACL reconstruction, bronchitis, contusion of left knee,  who presents to the ED complaining of dehydration. According to the patient, symptoms started a few days ago with vomit, left lower quadrant pain, chest pain, headache, hematochezia.  Patient denies sick contact at home.  Patient is asking for oxycodone .  Patient has history of cholecystectomy.     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) 01/01/2024   Psychosis (HCC) 11/19/2023   Moderate episode of recurrent major depressive disorder (HCC) 05/26/2023   Generalized anxiety disorder 05/26/2023   Iron deficiency anemia 05/26/2023   Migraine 05/26/2023   Vitamin D  deficiency 05/26/2023   Spinal stenosis of lumbar region 04/10/2023   Lumbar radiculopathy 04/10/2023   Anxiety about treatment 04/01/2023   Encounter for drug screening 03/25/2023   Chronic prescription benzodiazepine use 03/25/2023   Duodenal ulcer    Gastric erosion    Class 1 obesity due to excess calories without serious comorbidity with body mass index (BMI) of 34.0 to 34.9 in adult    Tobacco abuse    Chronic obstructive pulmonary disease (HCC)    HTN (hypertension) 03/01/2020   Endometriosis of pelvis 11/16/2015     Physical Exam   Triage Vital Signs: ED Triage  Vitals  Encounter Vitals Group     BP 07/08/24 1305 (!) 133/94     Girls Systolic BP Percentile --      Girls Diastolic BP Percentile --      Boys Systolic BP Percentile --      Boys Diastolic BP Percentile --      Pulse Rate 07/08/24 1305 77     Resp 07/08/24 1305 18     Temp 07/08/24 1305 (!) 97.5 F (36.4 C)     Temp Source 07/08/24 1305 Oral     SpO2 07/08/24 1305 98 %     Weight --      Height --      Head Circumference --      Peak Flow --      Pain Score 07/08/24 1304 9     Pain Loc --      Pain Education --      Exclude from Growth Chart --     Most recent vital signs: Vitals:   07/08/24 1305 07/08/24 1620  BP: (!) 133/94   Pulse: 77   Resp: 18   Temp: (!) 97.5 F (36.4 C)   SpO2: 98% 100%     Physical Exam Vitals and nursing note reviewed.  During triage patient was hypertensive  General:          Awake, no distress.  Face: No tenderness to palpation in frontal and maxillary sinuses. Mouth: Dry mucosa. CV:  Good peripheral perfusion. Regular rate and rhythm. Resp:               Normal effort. no tachypnea.Equal breath sounds bilaterally.  Abd:           Bowel sounds positive, skin is intact, no ecchymosis or hematomas.      No distention.  Soft, tender to palpation left lower quadrant.  No rebound.  Negative McBurney point.              ED Results / Procedures / Treatments   Labs (all labs ordered are listed, but only abnormal results are displayed) Labs Reviewed  BASIC METABOLIC PANEL WITH GFR - Abnormal; Notable for the following components:      Result Value   CO2 21 (*)    Calcium  8.5 (*)    All other components within normal limits  CBC - Abnormal; Notable for the following components:   Hemoglobin 11.6 (*)    All other components within normal limits  RESP PANEL BY RT-PCR (RSV, FLU A&B, COVID)  RVPGX2  TROPONIN T, HIGH SENSITIVITY  TROPONIN T, HIGH SENSITIVITY     EKG See physician read  Vent. rate 79 BPM  PR interval  152 ms  QRS duration 74 ms  QT/QTcB 382/438 ms  P-R-T axes 20 31 3    RADIOLOGY I independently reviewed and interpreted imaging and agree with radiologists findings.      PROCEDURES:  Critical Care performed:   Procedures   MEDICATIONS ORDERED IN ED: Medications  sodium chloride  0.9 % bolus 500 mL (0 mLs Intravenous Stopped 07/08/24 1849)  morphine  (PF) 4 MG/ML injection 4 mg (4 mg Intravenous Given 07/08/24 1734)  ondansetron  (ZOFRAN ) injection 4 mg (4 mg Intravenous Given 07/08/24 1733)  traMADol  (ULTRAM ) tablet 50 mg (50 mg Oral Given 07/08/24 1852)   Clinical Course as of 07/08/24 1854  Thu Jul 08, 2024  1645 Resp panel by RT-PCR (RSV, Flu A&B, Covid) Anterior Nasal Swab Negative [AE]  1645 Troponin T, High Sensitivity Within normal limits [AE]  1645 CBC(!) Anemia, hemoglobin 11.6 [AE]  1646 Basic metabolic panel(!) Electrolytes, renal function, anion gap within normal limits [AE]  1646 DG Chest 2 View No active cardiopulmonary disease. [AE]  1809 CT ABDOMEN PELVIS WO CONTRAST . 3 mm nonobstructing left renal calculi. 2. Evidence of prior cholecystectomy and hysterectomy. 3. Aortic atherosclerosis.   [AE]  1839 This is the patient, she continues with right epigastric pain.  Patient is asking to be discharged with a strong medication like oxycodone . [AE]    Clinical Course User Index [AE] Janit Kast, PA-C    IMPRESSION / MDM / ASSESSMENT AND PLAN / ED COURSE  I reviewed the triage vital signs and the nursing notes.  Differential diagnosis includes, but is not limited to, colitis, diverticulitis, pancreatitis, UTI, unlikely cholecystitis.  Patient's presentation is most consistent with acute complicated illness / injury requiring diagnostic workup.   Norma [name] is a 48 y.o., female presents today with history of vomiting, left lower quadrant pain, hematochezia, headache, chest pain.  See HPI for further information.  On a physical exam patient  was hypertensive during triage.  Mouth is dry, cardiopulmonary is clear, no wheezing, no rales, no crackles.  Abdomen, bowel sounds positive, skin is intact, no ecchymosis or hematomas.  Soft, tender to deep palpation in left lower quadrant.  Negative rebound, negative McBurney point.  Left knee presence of immobilization for ACL repair. Plan IV fluids Morphine  Zofran  Abdominal CT without contrast  Reassess.  Troponin came back negative, chest x-ray are negative for pneumonia or respiratory process.  CBC showed anemia, hemoglobin 11.6.  Respiratory panel was negative ruling out viral infection, BMP electrolytes and renal function within normal limits.  EKG markable. Abdominal CT without contrast showed left renal calculi that can explain the left lower quadrant pain.  Reassessed the patient, she continues with epigastric pain that increases with deep palpation.  I did order tramadol .  I did update patient with diagnosis of left kidney stone.  Patient is asking for stronger pain medication for discharge. Patient's diagnosis is consistent with nephrolithiasis, anemia. I independently reviewed and interpreted imaging and agree with radiologists findings. Labs are  reassuring. I did review the patient's allergies and medications.The patient is in stable and satisfactory condition for discharge home  Patient will be discharged home with prescriptions for Zofran , tramadol , sulfate ferrous. Patient is to follow up with urology as needed or otherwise directed. Patient is given ED precautions to return to the ED for any worsening or new symptoms. Discussed plan of care with patient, answered all of patient's questions, patient agreeable to plan of care. Advised patient to take medications according to the instructions on the label. Discussed possible side effects of new medications. Patient verbalized understanding.  FINAL CLINICAL IMPRESSION(S) / ED DIAGNOSES   Final diagnoses:  Left nephrolithiasis  Chest  wall pain  Other iron deficiency anemia     Rx / DC Orders   ED Discharge Orders          Ordered    ondansetron  (ZOFRAN -ODT) 4 MG disintegrating tablet  Every 8 hours PRN        07/08/24 1846    traMADol  (ULTRAM ) 50 MG tablet  Every 6 hours PRN        07/08/24 1846    ferrous sulfate  300 (60 Fe) MG/5ML syrup  Daily        07/08/24 1850             Note:  This document was prepared using Dragon voice recognition software and may include unintentional dictation errors.   Janit Kast, PA-C 07/08/24 1854  "

## 2024-07-08 NOTE — ED Triage Notes (Signed)
 Pt to ED via POV from home. Pt reports CP x2 days and feeling dehydrated because she hasn't been able to eat. Denies N/V/D. Pt also endorses HA and chills.

## 2024-07-09 ENCOUNTER — Encounter (HOSPITAL_BASED_OUTPATIENT_CLINIC_OR_DEPARTMENT_OTHER): Payer: Self-pay

## 2024-07-09 ENCOUNTER — Ambulatory Visit: Payer: Self-pay
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
## 2024-07-09 NOTE — Telephone Encounter (Signed)
 Pt hung up when transferring from specialist to nurse triage. Called pt back, VM not set up.   Copied from CRM [id number]. Topic: General - Other >> Jul 09, 2024  1:51 PM Zebedee SAUNDERS wrote: Reason for CRM: Pt stated she did not get any relief from Ubrogepant  (UBRELVY ) 100 MG TABS for headaches. Pt wants to know if Dr. Ziglar can prescribe her something stronger or does she need to see pt again? Please call pt at  [phone]. >> Jul 09, 2024  2:36 PM Ivette P wrote: Pt having headaches and does not know what to do. On the way to the clinic >> Jul 09, 2024  1:56 PM Zebedee SAUNDERS wrote: Pt can also be contacted via MyChart.

## 2024-07-10 ENCOUNTER — Emergency Department
Admission: EM | Admit: 2024-07-10 | Discharge: 2024-07-10 | Disposition: A | Discharge status: Home / Self Care | Payer: MEDICAID | Attending: Emergency Medicine | Admitting: Emergency Medicine

## 2024-07-10 ENCOUNTER — Other Ambulatory Visit: Payer: Self-pay

## 2024-07-10 ENCOUNTER — Emergency Department: Payer: MEDICAID

## 2024-07-10 DIAGNOSIS — I10 Essential (primary) hypertension: Secondary | ICD-10-CM | POA: Insufficient documentation

## 2024-07-10 DIAGNOSIS — R519 Headache, unspecified: Secondary | ICD-10-CM | POA: Insufficient documentation

## 2024-07-10 DIAGNOSIS — M542 Cervicalgia: Secondary | ICD-10-CM | POA: Insufficient documentation

## 2024-07-10 DIAGNOSIS — W19XXXA Unspecified fall, initial encounter: Secondary | ICD-10-CM | POA: Diagnosis not present

## 2024-07-10 DIAGNOSIS — M546 Pain in thoracic spine: Secondary | ICD-10-CM | POA: Insufficient documentation

## 2024-07-10 DIAGNOSIS — T07XXXA Unspecified multiple injuries, initial encounter: Secondary | ICD-10-CM

## 2024-07-10 DIAGNOSIS — J449 Chronic obstructive pulmonary disease, unspecified: Secondary | ICD-10-CM | POA: Insufficient documentation

## 2024-07-10 MED ORDER — IBUPROFEN 600 MG PO TABS: 600.0000 mg | ORAL_TABLET | Freq: Four times a day (QID) | ORAL | 0 refills | Status: AC | PRN

## 2024-07-10 MED ORDER — ACETAMINOPHEN-CODEINE 300-30 MG PO TABS: 1.0000 | ORAL_TABLET | Freq: Four times a day (QID) | ORAL | 0 refills | Status: DC | PRN

## 2024-07-10 NOTE — ED Triage Notes (Signed)
 Pt to ED for severe HA, neck pain and mid back pain since last night. Pt has been seen here on 12/25, 12/20 and 12/16 for other complaints. Pt is ambulatory with unlabored respirations.

## 2024-07-10 NOTE — ED Notes (Signed)
 Pt left the room and advised she did not want to come back and be seen.
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
## 2024-07-10 NOTE — Assessment & Plan Note (Signed)
 Offered her Toradol  injection and an Imitrex  injection.  Will write for Ubrelvy  and see if we can get it paid for.  She will except the Toradol .  She is requesting narcotics for her headache.  Advised cannot fill narcotics because she has had 155 mg oxycodone  in the last 3 weeks.

## 2024-07-10 NOTE — Discharge Instructions (Addendum)
 You will need to make an with your primary care provider for reevaluation.  Also to let her know that you have not gotten your prescription for Ubrelvy .  A prescription for Tylenol  with codeine  was sent to the pharmacy.  This medication is a narcotic and should not be taken in addition to other pain medications.  A prescription for ibuprofen  was sent to the pharmacy for inflammation due to your fall.  You may use ice to your muscles and back as needed.

## 2024-07-12 ENCOUNTER — Telehealth: Payer: Self-pay | Admitting: Pharmacy Technician

## 2024-07-12 ENCOUNTER — Other Ambulatory Visit (HOSPITAL_COMMUNITY): Payer: Self-pay

## 2024-07-12 NOTE — Telephone Encounter (Signed)
 Pharmacy Patient Advocate Encounter   Received notification from Onbase that prior authorization for Ubrelvy  100MG  tablets is required/requested.   Insurance verification completed.   The patient is insured through [place] MEDICA[employer].   Per test claim: PA required; PA submitted to above mentioned insurance via Latent Key/confirmation #/EOC [id number] Status is pending

## 2024-07-12 NOTE — Telephone Encounter (Signed)
 Pharmacy Patient Advocate Encounter  Received notification from [employer] [employer] that Prior Authorization for Ubrelvy  100MG  tablets has been DENIED.  Full denial letter will be uploaded to the media tab. See denial reason below.   PA #/Case ID/Reference #: [id number]

## 2024-07-14 ENCOUNTER — Ambulatory Visit: Payer: Self-pay

## 2024-07-14 NOTE — Telephone Encounter (Signed)
 FYI Only or Action Required?: FYI only for provider: referred pt to urgent care due to no openings available today.  Patient was last seen in primary care on 07/06/2024 by Ziglar, Susan K, MD.  Called Nurse Triage reporting Pain.  Symptoms began today.  Interventions attempted: Nothing.  Symptoms are: gradually worsening.  Triage Disposition: See HCP Within 4 Hours (Or PCP Triage)  Patient/caregiver understands and will follow disposition?: Yes    Copied from CRM [id number]. Topic: Clinical - Red Word Triage >> Jul 14, 2024 10:53 AM Shanda MATSU wrote: Red Word that prompted transfer to Nurse Triage: Patient is reporting a painful knot on left knee, is the same knee she had knee surgery on back in 05/2024, patient reporting pain level of 8. Reason for Disposition  [1] SEVERE pain (e.g., excruciating, unable to do any normal activities) AND [2] not improved after 2 hours of pain medicine  Answer Assessment - Initial Assessment Questions 1. ONSET: When did the pain start?      today 2. LOCATION: Where is the pain located?      Left leg upper and down to ankle 3. PAIN: How bad is the pain?    (Scale 1-10; or mild, moderate, severe)     8/10 4. WORK OR EXERCISE: Has there been any recent work or exercise that involved this part of the body?      na 5. CAUSE: What do you think is causing the leg pain?     na 6. OTHER SYMPTOMS: Do you have any other symptoms? (e.g., chest pain, back pain, breathing difficulty, swelling, rash, fever, numbness, weakness)     Has knot beside kneecap, swelling 7. PREGNANCY: Is there any chance you are pregnant? When was your last menstrual period?     Na  No appts available therefore referred pt to urgent care: pt verbalized understanding: stating going now  Protocols used: Leg Pain-A-AH

## 2024-07-22 ENCOUNTER — Other Ambulatory Visit: Payer: Self-pay | Admitting: Family Medicine

## 2024-07-22 NOTE — Telephone Encounter (Signed)
 Copied from CRM #[id number]. Topic: Clinical - Medication Refill >> Jul 22, 2024 10:40 AM Ivette P wrote: Medication: clonazePAM  (KLONOPIN ) 1 MG tablet  Has the patient contacted their pharmacy? Yes (Agent: If no, request that the patient contact the pharmacy for the refill. If patient does not wish to contact the pharmacy document the reason why and proceed with request.) (Agent: If yes, when and what did the pharmacy advise?)  This is the patient's preferred pharmacy:  [facility] - [place], [place] - [address] [place] [address] Phone: [phone] Fax: [phone]   Is this the correct pharmacy for this prescription? Yes If no, delete pharmacy and type the correct one.   Has the prescription been filled recently? No  Is the patient out of the medication? Yes  Has the patient been seen for an appointment in the last year OR does the patient have an upcoming appointment? Yes  Can we respond through MyChart? Yes  Agent: Please be advised that Rx refills may take up to 3 business days. We ask that you follow-up with your pharmacy.

## 2024-07-27 ENCOUNTER — Telehealth: Payer: Self-pay

## 2024-07-27 DIAGNOSIS — R3129 Other microscopic hematuria: Secondary | ICD-10-CM

## 2024-07-27 NOTE — Patient Outreach (Signed)
 Referral placed to [employer] for BSW support.  Marjorie Ao, BSW, CDP [place]  [facility] - [facility] Manager Population Health Direct Dial: [phone]  Fax: [phone]

## 2024-07-30 ENCOUNTER — Encounter: Payer: Self-pay | Admitting: Family Medicine

## 2024-07-30 ENCOUNTER — Ambulatory Visit: Payer: MEDICAID | Admitting: Family Medicine

## 2024-07-30 VITALS — BP 139/89 | HR 89 | Resp 15 | Ht 62.0 in | Wt 206.0 lb

## 2024-07-30 DIAGNOSIS — F5101 Primary insomnia: Secondary | ICD-10-CM

## 2024-07-30 DIAGNOSIS — F419 Anxiety disorder, unspecified: Secondary | ICD-10-CM | POA: Diagnosis not present

## 2024-07-30 DIAGNOSIS — G43E01 Chronic migraine with aura, not intractable, with status migrainosus: Secondary | ICD-10-CM

## 2024-07-30 DIAGNOSIS — G43709 Chronic migraine without aura, not intractable, without status migrainosus: Secondary | ICD-10-CM

## 2024-07-30 MED ORDER — SUMATRIPTAN SUCCINATE 6 MG/0.5ML ~~LOC~~ SOLN: 6.0000 mg | SUBCUTANEOUS | 11 refills | Status: AC | PRN

## 2024-07-30 MED ORDER — QUETIAPINE FUMARATE 25 MG PO TABS: 25.0000 mg | ORAL_TABLET | Freq: Every day | ORAL | 1 refills | Status: AC

## 2024-07-30 NOTE — Assessment & Plan Note (Signed)
 Imitrex  injection today.  No more than one daily.

## 2024-07-31 DIAGNOSIS — G43709 Chronic migraine without aura, not intractable, without status migrainosus: Secondary | ICD-10-CM | POA: Insufficient documentation

## 2024-07-31 DIAGNOSIS — F419 Anxiety disorder, unspecified: Secondary | ICD-10-CM | POA: Insufficient documentation

## 2024-07-31 NOTE — Assessment & Plan Note (Signed)
 She has for refill of clonazepam  today.  Advised she needs to discuss this with her psychiatrist.

## 2024-07-31 NOTE — Assessment & Plan Note (Signed)
 Offered her Seroquel  25 mg nightly.  If she improves her sleep her migraines may get better.

## 2024-07-31 NOTE — Assessment & Plan Note (Signed)
 Imitrex  6 mg may repeat in 2 hours if needed.  No more than 2 injections in 24 hours.

## 2024-07-31 NOTE — Progress Notes (Signed)
 "  Established Patient Office Visit  Subjective   Patient ID: Norma [name], female    DOB: 1976-04-05  Age: 49 y.o. MRN: [id number]  Chief Complaint  Patient presents with   Headache    Headache    Discussed the use of AI scribe software for clinical note transcription with the patient, who gave verbal consent to proceed.  History of Present Illness   Norma [name] is a 48 year old female who presents for follow-up of her headache management.  She has been experiencing chronic headaches intermittently for the past couple of months. She is followed by Dr. Lane, Neurologist.  She was prescribed nortriptyline, starting at 10 mg with a gradual increase to 20 mg and then 30 mg weekly, and has been compliant with this regimen. A Medrol  Dosepak was prescribed to interrupt the headache cycle, but it was ineffective. She reports poor sleep quality. She previously tried trazodone but did not tolerate it well. Currently, she is taking Prozac  (fluoxetine ) 40 mg and Qulipta, which is covered by her insurance. She has a history of using Ubrelvy  effectively, but her insurance has recently denied coverage. Imitrex  injections have been beneficial for her migraines.  She underwent knee surgery for two torn ligaments and an ACL tear. She continues to experience pain on the side of her knee and suspects another tear. She is considering using a pain patch for relief.  She has a history of kidney stones and is currently taking Flomax  (tamsulosin ) 0.4 mg for this condition. She had a cystoscopy due to bladder bleeding, but no abnormalities were found.  An EEG performed in August was normal. No seizures.       Objective:     BP 139/89   Pulse 89   Resp 15   Ht 5' 2 (1.575 m)   Wt 206 lb (93.4 kg)   LMP 09/24/2015   SpO2 99%   BMI 37.68 kg/m    Physical Exam Vitals and nursing note reviewed.  Constitutional:      Appearance: Normal appearance.  HENT:     Head: Normocephalic and  atraumatic.  Eyes:     General:        Right eye: No discharge.        Left eye: No discharge.     Conjunctiva/sclera: Conjunctivae normal.  Cardiovascular:     Rate and Rhythm: Normal rate and regular rhythm.  Pulmonary:     Effort: Pulmonary effort is normal.     Breath sounds: Normal breath sounds.  Musculoskeletal:     Right lower leg: No edema.     Left lower leg: No edema.  Skin:    General: Skin is warm and dry.  Neurological:     Mental Status: She is alert and oriented to person, place, and time.  Psychiatric:        Mood and Affect: Mood normal.        Behavior: Behavior normal.        Thought Content: Thought content normal.        Judgment: Judgment normal.          No results found for any visits on 07/30/24.    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  No more than one daily.    Orders: -     SUMAtriptan  Succinate;  Inject 0.5 mLs (6 mg total) into the skin every 2 (two) hours as needed for migraine or headache. May repeat in 2 hours if headache persists or recurs.  Dispense: 2 mL; Refill: 11 -     QUEtiapine  Fumarate; Take 1 tablet (25 mg total) by mouth at bedtime.  Dispense: 30 tablet; Refill: 1  Chronic migraine w/o aura w/o status migrainosus, not intractable Assessment & Plan: Imitrex  6 mg may repeat in 2 hours if needed.  No more than 2 injections in 24 hours.   Primary insomnia Assessment & Plan: Offered her Seroquel  25 mg nightly.  If she improves her sleep her migraines may get better.   Anxiety Assessment & Plan: She has for refill of clonazepam  today.  Advised she needs to discuss this with her psychiatrist.      Return in about 4 weeks (around 08/27/2024).    Anisah Kuck K Staceyann Knouff, MD "

## 2024-08-03 ENCOUNTER — Other Ambulatory Visit: Payer: MEDICAID

## 2024-08-03 NOTE — Patient Outreach (Signed)
 Referral received from Ziglar, Susan K, MD for assistance with care management needs. This patient is enrolled in (name of health plan) and has associated care management benefits. I reached out to the health plan today to refer the patient to the assigned health plan care management team member.   Interventions:  Norma [name] was advised to anticipate contact from the health plan for follow up about care management services and resources.  SW provided patient with resources for rent and utities. SW completed referral for [facility] for patient to be connected to case management.  Thersia Hoar, HEDWIG, MHA [place]  Value Based [facility] Social Worker, Population Health [phone]   Thersia Hoar, BSW, [facility]  [facility] Social Worker, Population Health [phone]

## 2024-08-03 NOTE — Patient Instructions (Signed)
 Visit Information  Thank you for taking time to visit with me today.   Tailored Plan Medicaid On January 13, 2023 some people on [place] Medicaid will move to a new kind of Medicaid health plan called a Tailored Plan. Tailored Plans cover your doctor visits, prescription drugs, and health care services.    If your [place] Medicaid will move to a Tailored Plan, you should have gotten a letter and welcome packet. If you're not sure, call your [place] Medicaid Enrollment Broker at [phone] and ask.  Check out these free materials, in Spanish and English, to learn more about your Tailored Plan: [email or web address]/Tailored-Plans/Toolkit  Tailored Care Management Services  TCM services are available to you now. If you are a Tailored Plan member or will be and want information about Tailored Care Management Services including rides to appointments and community and home services, call the Care Management provider for your county of residence:    [facility] [place], [place])  Member Services: [phone] Behavioral Health Crisis Line: [phone], [place], [place], [place], [place])  Member Services: [phone] Behavioral Health Crisis Line: 9158519204     Please call the Suicide and Crisis Lifeline: 988 call the [place]  National Suicide Prevention Lifeline: (704)581-5582 or TTY: (360)559-0160 TTY (641)189-4627) to talk to a trained counselor call 1-800-273-TALK (toll free, 24 hour hotline) call 911 if you are experiencing a Mental Health or Behavioral Health Crisis or need someone to talk to.  Care plan and visit instructions communicated with the patient verbally today. Patient agrees to receive a copy in MyChart. Active MyChart status and patient understanding of how to access instructions and care plan via MyChart confirmed with patient.     Thersia Hoar, HEDWIG, MHA [place]  [facility] Social Worker, Population Health [phone]

## 2024-08-05 NOTE — Patient Outreach (Signed)
 SW received a voicemail from [place] with [facility] regarding the referral sent to [place]. SW contacted Chelsey back at [phone] and left a voicemail.  Thersia Hoar, HEDWIG, MHA   [facility] Social Worker, Population Health [phone]

## 2024-08-20 ENCOUNTER — Telehealth: Payer: MEDICAID

## 2024-08-20 NOTE — Patient Instructions (Signed)
 Norma [name] - I am sorry I was unable to reach you today for our scheduled appointment. I work with Ziglar, Susan K, MD and am calling to support your healthcare needs. Please contact me at [phone] at your earliest convenience. I look forward to speaking with you soon.   Thank you,  Thersia Hoar, BSW, MHA [place]  [facility] Social Worker, Population Health [phone]

## 2024-08-30 ENCOUNTER — Ambulatory Visit: Payer: MEDICAID | Admitting: Family Medicine

## 2024-09-02 ENCOUNTER — Telehealth: Payer: MEDICAID
